# Patient Record
Sex: Male | Born: 1940 | Race: White | Hispanic: No | Marital: Married | Smoking: Former smoker
Health system: Southern US, Community
[De-identification: ages and names within clinical notes are randomized; demographics above are authoritative.]

## PROBLEM LIST (undated history)

## (undated) DIAGNOSIS — M21371 Foot drop, right foot: Secondary | ICD-10-CM

## (undated) DIAGNOSIS — R531 Weakness: Secondary | ICD-10-CM

## (undated) DIAGNOSIS — E785 Hyperlipidemia, unspecified: Secondary | ICD-10-CM

## (undated) DIAGNOSIS — N411 Chronic prostatitis: Secondary | ICD-10-CM

## (undated) DIAGNOSIS — F0390 Unspecified dementia without behavioral disturbance: Secondary | ICD-10-CM

## (undated) DIAGNOSIS — N400 Enlarged prostate without lower urinary tract symptoms: Secondary | ICD-10-CM

## (undated) DIAGNOSIS — K219 Gastro-esophageal reflux disease without esophagitis: Secondary | ICD-10-CM

## (undated) DIAGNOSIS — J449 Chronic obstructive pulmonary disease, unspecified: Secondary | ICD-10-CM

## (undated) DIAGNOSIS — G35 Multiple sclerosis: Secondary | ICD-10-CM

## (undated) DIAGNOSIS — R269 Unspecified abnormalities of gait and mobility: Secondary | ICD-10-CM

## (undated) DIAGNOSIS — D369 Benign neoplasm, unspecified site: Secondary | ICD-10-CM

## (undated) DIAGNOSIS — T7840XA Allergy, unspecified, initial encounter: Secondary | ICD-10-CM

## (undated) DIAGNOSIS — R972 Elevated prostate specific antigen [PSA]: Secondary | ICD-10-CM

## (undated) HISTORY — DX: Allergy, unspecified, initial encounter: T78.40XA

## (undated) HISTORY — DX: Unspecified dementia without behavioral disturbance: F03.90

## (undated) HISTORY — DX: Multiple sclerosis: G35

## (undated) HISTORY — DX: Benign prostatic hyperplasia without lower urinary tract symptoms: N40.0

## (undated) HISTORY — DX: Chronic obstructive pulmonary disease, unspecified: J44.9

## (undated) HISTORY — DX: Benign neoplasm, unspecified site: D36.9

## (undated) HISTORY — DX: Elevated prostate specific antigen (PSA): R97.20

## (undated) HISTORY — DX: Unspecified abnormalities of gait and mobility: R26.9

## (undated) HISTORY — DX: Gastro-esophageal reflux disease without esophagitis: K21.9

## (undated) HISTORY — DX: Foot drop, right foot: M21.371

## (undated) HISTORY — PX: HERNIA REPAIR: SHX51

## (undated) HISTORY — DX: Weakness: R53.1

## (undated) HISTORY — DX: Hyperlipidemia, unspecified: E78.5

## (undated) HISTORY — DX: Chronic prostatitis: N41.1

---

## 2000-06-16 ENCOUNTER — Encounter: Payer: Self-pay | Admitting: Internal Medicine

## 2000-06-16 ENCOUNTER — Encounter: Admission: RE | Admit: 2000-06-16 | Discharge: 2000-06-16 | Payer: Self-pay | Admitting: Family Medicine

## 2002-03-20 ENCOUNTER — Encounter: Payer: Self-pay | Admitting: Internal Medicine

## 2002-03-20 ENCOUNTER — Encounter: Admission: RE | Admit: 2002-03-20 | Discharge: 2002-03-20 | Payer: Self-pay | Admitting: Internal Medicine

## 2002-04-16 ENCOUNTER — Encounter (INDEPENDENT_AMBULATORY_CARE_PROVIDER_SITE_OTHER): Payer: Self-pay | Admitting: Specialist

## 2002-04-16 ENCOUNTER — Ambulatory Visit (HOSPITAL_COMMUNITY): Admission: RE | Admit: 2002-04-16 | Discharge: 2002-04-16 | Payer: Self-pay | Admitting: Gastroenterology

## 2002-04-16 HISTORY — PX: ESOPHAGOGASTRODUODENOSCOPY ENDOSCOPY: SHX5814

## 2002-04-23 ENCOUNTER — Ambulatory Visit (HOSPITAL_COMMUNITY): Admission: RE | Admit: 2002-04-23 | Discharge: 2002-04-23 | Payer: Self-pay | Admitting: Gastroenterology

## 2002-05-08 ENCOUNTER — Encounter: Payer: Self-pay | Admitting: General Surgery

## 2002-05-08 ENCOUNTER — Encounter: Admission: RE | Admit: 2002-05-08 | Discharge: 2002-05-08 | Payer: Self-pay | Admitting: General Surgery

## 2002-05-21 ENCOUNTER — Ambulatory Visit (HOSPITAL_COMMUNITY): Admission: RE | Admit: 2002-05-21 | Discharge: 2002-05-21 | Payer: Self-pay | Admitting: Gastroenterology

## 2002-06-06 ENCOUNTER — Encounter: Admission: RE | Admit: 2002-06-06 | Discharge: 2002-06-06 | Payer: Self-pay | Admitting: General Surgery

## 2002-06-06 ENCOUNTER — Encounter: Payer: Self-pay | Admitting: General Surgery

## 2003-11-18 ENCOUNTER — Ambulatory Visit (HOSPITAL_COMMUNITY): Admission: RE | Admit: 2003-11-18 | Discharge: 2003-11-18 | Payer: Self-pay | Admitting: Orthopedic Surgery

## 2003-11-18 ENCOUNTER — Ambulatory Visit (HOSPITAL_BASED_OUTPATIENT_CLINIC_OR_DEPARTMENT_OTHER): Admission: RE | Admit: 2003-11-18 | Discharge: 2003-11-18 | Payer: Self-pay | Admitting: Orthopedic Surgery

## 2005-05-12 ENCOUNTER — Ambulatory Visit: Payer: Self-pay

## 2005-05-16 ENCOUNTER — Encounter: Admission: RE | Admit: 2005-05-16 | Discharge: 2005-05-16 | Payer: Self-pay | Admitting: Internal Medicine

## 2006-05-26 ENCOUNTER — Encounter: Admission: RE | Admit: 2006-05-26 | Discharge: 2006-05-26 | Payer: Self-pay | Admitting: Internal Medicine

## 2006-09-18 ENCOUNTER — Encounter: Admission: RE | Admit: 2006-09-18 | Discharge: 2006-09-18 | Payer: Self-pay | Admitting: Internal Medicine

## 2006-09-19 DIAGNOSIS — G35 Multiple sclerosis: Secondary | ICD-10-CM

## 2006-09-19 DIAGNOSIS — M21371 Foot drop, right foot: Secondary | ICD-10-CM

## 2006-09-19 HISTORY — DX: Foot drop, right foot: M21.371

## 2006-09-19 HISTORY — DX: Multiple sclerosis: G35

## 2007-10-17 ENCOUNTER — Encounter: Admission: RE | Admit: 2007-10-17 | Discharge: 2007-10-17 | Payer: Self-pay | Admitting: Neurology

## 2008-09-19 DIAGNOSIS — F0390 Unspecified dementia without behavioral disturbance: Secondary | ICD-10-CM

## 2008-09-19 HISTORY — DX: Unspecified dementia, unspecified severity, without behavioral disturbance, psychotic disturbance, mood disturbance, and anxiety: F03.90

## 2008-09-29 ENCOUNTER — Ambulatory Visit (HOSPITAL_BASED_OUTPATIENT_CLINIC_OR_DEPARTMENT_OTHER): Admission: RE | Admit: 2008-09-29 | Discharge: 2008-09-30 | Payer: Self-pay | Admitting: Urology

## 2008-09-29 ENCOUNTER — Encounter (INDEPENDENT_AMBULATORY_CARE_PROVIDER_SITE_OTHER): Payer: Self-pay | Admitting: Urology

## 2008-09-29 HISTORY — PX: PROSTATE SURGERY: SHX751

## 2008-09-30 ENCOUNTER — Emergency Department (HOSPITAL_COMMUNITY): Admission: EM | Admit: 2008-09-30 | Discharge: 2008-09-30 | Payer: Self-pay | Admitting: Emergency Medicine

## 2008-11-28 ENCOUNTER — Encounter: Admission: RE | Admit: 2008-11-28 | Discharge: 2008-11-28 | Payer: Self-pay | Admitting: Neurology

## 2008-11-29 ENCOUNTER — Encounter: Admission: RE | Admit: 2008-11-29 | Discharge: 2008-11-29 | Payer: Self-pay | Admitting: Neurology

## 2009-03-24 ENCOUNTER — Ambulatory Visit: Payer: Self-pay | Admitting: Internal Medicine

## 2009-05-21 ENCOUNTER — Emergency Department (HOSPITAL_COMMUNITY): Admission: EM | Admit: 2009-05-21 | Discharge: 2009-05-21 | Payer: Self-pay | Admitting: Emergency Medicine

## 2009-07-06 ENCOUNTER — Ambulatory Visit: Payer: Self-pay | Admitting: Psychology

## 2009-07-31 DIAGNOSIS — R269 Unspecified abnormalities of gait and mobility: Secondary | ICD-10-CM | POA: Insufficient documentation

## 2009-07-31 HISTORY — DX: Unspecified abnormalities of gait and mobility: R26.9

## 2009-12-07 ENCOUNTER — Ambulatory Visit: Payer: Self-pay | Admitting: Internal Medicine

## 2010-10-05 ENCOUNTER — Encounter
Admission: RE | Admit: 2010-10-05 | Discharge: 2010-10-05 | Payer: Self-pay | Source: Home / Self Care | Attending: Neurology | Admitting: Neurology

## 2010-10-10 ENCOUNTER — Encounter: Payer: Self-pay | Admitting: Urology

## 2010-12-24 LAB — DIFFERENTIAL
Basophils Absolute: 0 10*3/uL (ref 0.0–0.1)
Basophils Relative: 0 % (ref 0–1)
Eosinophils Absolute: 0.2 10*3/uL (ref 0.0–0.7)
Neutro Abs: 3.9 10*3/uL (ref 1.7–7.7)
Neutrophils Relative %: 57 % (ref 43–77)

## 2010-12-24 LAB — CBC
Hemoglobin: 15.7 g/dL (ref 13.0–17.0)
MCHC: 33.7 g/dL (ref 30.0–36.0)
MCV: 89.9 fL (ref 78.0–100.0)
RBC: 5.19 MIL/uL (ref 4.22–5.81)
RDW: 14.7 % (ref 11.5–15.5)

## 2010-12-24 LAB — COMPREHENSIVE METABOLIC PANEL
ALT: 13 U/L (ref 0–53)
Alkaline Phosphatase: 66 U/L (ref 39–117)
BUN: 17 mg/dL (ref 6–23)
CO2: 28 mEq/L (ref 19–32)
GFR calc non Af Amer: >60 mL/min (ref >60)
Glucose, Bld: 88 mg/dL (ref 70–99)
Potassium: 4.1 mEq/L (ref 3.5–5.1)
Sodium: 138 mEq/L (ref 135–145)

## 2010-12-24 LAB — CK TOTAL AND CKMB (NOT AT ARMC): Relative Index: INVALID (ref 0.0–2.5)

## 2010-12-24 LAB — TROPONIN I: Troponin I: 0.01 ng/mL (ref 0.00–0.06)

## 2011-01-03 LAB — POCT HEMOGLOBIN-HEMACUE: Hemoglobin: 15.5 g/dL (ref 13.0–17.0)

## 2011-01-03 LAB — TISSUE CULTURE

## 2011-02-01 NOTE — Op Note (Signed)
NAMEBETTY, BROOKS               ACCOUNT NO.:  [id number]   MEDICAL RECORD NO.:  [id number]          PATIENT TYPE:  AMB   LOCATION:  [facility]                         FACILITY:  [facility]   PHYSICIAN:  Mark C. Vernie Ammons, M.D.  DATE OF BIRTH:  1941-07-23   DATE OF PROCEDURE:  09/29/2008  DATE OF DISCHARGE:                               OPERATIVE REPORT   PREOPERATIVE DIAGNOSES:  1. Benign prostatic hypertrophy with bladder outlet obstruction.  2. Prostate pain.  3. Prostate abnormality.   POSTOPERATIVE DIAGNOSES:  1. Benign prostatic hypertrophy with bladder outlet obstruction.  2. Intraprostatic abscess.   PROCEDURES:  1. Transrectal ultrasound.  2. Transrectal ultrasound guided aspiration of prostatic abscess.  3. Transurethral resection of prostate.   SURGEON:  Mark C. Vernie Ammons, M.D.   ANESTHESIA:  General.   SPECIMENS:  1. Fluid from prostatic abscess sent for culture and sensitivity.  2. Prostate chips to pathology.   DRAIN:  A 22-French three-way Foley catheter.   BLOOD LOSS:  Approximately 50 mL.   COMPLICATIONS:  None.   INDICATIONS:  The patient is a 70 year old white male who for several  months now has been having prostate pain and by ultrasound was found to  have an abnormality of the left lobe of the prostate that did not appear  to be an abscess at that time, although it did appear to be inflammatory  in nature since it was tender.  He had been on several antibiotics  without improvement.  We therefore discussed transrectal ultrasound and  transurethral unroofing of what appeared to possibly be an abscess.  The  risks, complications, alternatives and limitations have been discussed  with the patient and he understands and has elected to proceed.   DESCRIPTION OF OPERATION:  After informed consent, the patient brought  to the major OR, administered general anesthesia and then moved to the  dorsal lithotomy position.  His genitalia were sterilely prepped and  draped and an official timeout was performed.  He received Unasyn and  gentamicin IV preoperatively.  His urethra was sounded with Sissy Hoff  sounds to 30-French.  I then used a 28-French resectoscope sheath with  Timberlake obturator and inserted this in the urethra, removing the  obturator and inserting the resectoscope element with 12 degrees lens.  The bladder was fully inspected and noted to be free of any tumor,  stones or inflammatory lesions.  The ureteral orifices were noted to be  of normal configuration and position and well away from the bladder  neck.   I left the cystoscopic sheath in the urethra and then performed  transrectal ultrasound.   I sequestered the rectum from the rest of the surgical field and through  this access passed the 10 MHz transrectal ultrasound probe into the  rectum.  Transrectal ultrasound was then performed in the axial plane  and I noted the area within the prostate on the left-hand side  approximately mid prostate.  No other lesions or abnormality of the  prostate were noted.   I removed the transrectal ultrasound probe and initially began resecting  the prostate on the  left-hand side from the bladder neck back to the  veru.  I started approximately in the 6 o'clock position and then worked  in a counterclockwise fashion to resect away the prostate in the area  where I felt the prostatic abnormality on the transrectal ultrasound was  located.  I did not appear to encounter the area initially and therefore  reinserted the transrectal ultrasound probe.   Again with the transrectal ultrasound probe be in place, I passed a  spinal needle through the working channel of the ultrasound probe and  under ultrasound guidance was able to pass this into the area of the  prostate that was felt to be abnormal.  I then aspirated this with a 5  mL syringe and got back about 2-3 mL of old bloody appearing material.  It did not appear to be grossly purulent.   This was sent for culture and  sensitivity.   With this spinal needle then within the lesion, I cut the tip off of the  spinal needle and left that in the prostate in order to allow easier  identification of the location of the lesion and then began re-resecting  more of the prostate.   I then resected more of the prostate on the left-hand side and came upon  the needle and noted it was within a pocket within the prostate.  This  was unroofed completely, and I then removed the needle from the rectum.  I then proceeded to resect away the right lobe of the prostate.   I used the Microvasive evacuator to remove the prostatic chips from the  bladder and then reinspected the bladder and noted no injury to the  bladder.  The ureteral orifices were intact and well away from the area  of resection.  The remaining few bleeding points in the prostatic fossa  were fulgurated.  I then left the bladder full, removed the resectoscope  and inserted the Foley catheter and filled the balloon with 25 mL of  sterile water.  This was connected to closed system drainage and  continuous bladder irrigation.  The patient was awakened and taken to  the recovery room in stable and satisfactory condition.  He tolerated  the procedure well.  There were no intraoperative complications.  He  will be monitored overnight and his catheter will be removed in the  morning for a voiding trial.  He will be maintained on gentamicin and  Unasyn while in the hospital and have his oral antibiotics changed based  on the results of the abscess culture results.      Mark C. Vernie Ammons, M.D.  Electronically Signed     MCO/MEDQ  D:  09/29/2008  T:  09/29/2008  Job:  [id number]

## 2011-02-04 NOTE — Op Note (Signed)
   NAME:  [name], [name]                         ACCOUNT NO.:  [id number]   MEDICAL RECORD NO.:  [id number]                   PATIENT TYPE:  AMB   LOCATION:  ENDO                                 FACILITY:  [facility]   PHYSICIAN:  Charolett Bumpers, M.D.             DATE OF BIRTH:  07-14-1941   DATE OF PROCEDURE:  DATE OF DISCHARGE:  04/23/2002                                 OPERATIVE REPORT   PROCEDURE INDICATION:  The patient is a 70 year old male with chronic  gastroesophageal reflux who is undergoing a preoperative esophageal  manometry.  He is to meet with Dr. Wenda Low to discuss laparoscopic  Nissen fundoplication.  His esophageal manometry was performed at [facility] on April 23, 2002, without apparent complications.   1. Lower esophageal sphincter function.  The patient's resting lower     esophageal sphincter pressure is 52 mmHg which is in the upper limits of     normal.  There is 86% relaxation of the lower esophageal sphincter for     ten seconds with a swallow.  His residual pressure is on 7 mmHg which is     well within the normal limits.  His lower esophageal sphincter function     is normal.   1. Esophageal body function.  Low esophageal body function is based on ten     wet swallows which resulted in 100% peristaltic contraction waves with an     amplitude of 58 mmHg which is within the normal range.  His esophageal     body function appears normal.   ASSESSMENT:  Normal esophageal manometry.  The patient is considered to be a  good candidate for antireflux surgery.                                               Charolett Bumpers, M.D.    MKJ/MEDQ  D:  04/30/2002  T:  05/02/2002  Job:  [id number]   cc:   Barbette Or, M.D.   [place] Daphine Deutscher, M.D.  Fax: [phone]

## 2011-02-04 NOTE — Op Note (Signed)
NAME:  [name], [name] NO.:  [id number]   MEDICAL RECORD NO.:  [id number]                   PATIENT TYPE:  AMB   LOCATION:  ENDO                                 FACILITY:  [facility]   PHYSICIAN:  [name], M.D.             DATE OF BIRTH:  [birth year]   DATE OF PROCEDURE:  DATE OF DISCHARGE:                                 OPERATIVE REPORT   INDICATIONS FOR PROCEDURE:  Mr. [name] is a 70 year old male born December 16, 1940.  Mr. [name] has gastroesophageal reflux disease manifested by  heartburn and regurgitation.  If he takes his proton pump inhibitor, his  heartburn resolves, but he has continuous regurgitation.  He is interested  in being evaluated for antireflux surgery.   His July 2003 barium swallow, upper GI x-ray series revealed a hiatal hernia  with normal esophageal function.  Mr. [name] is scheduled to undergo a  diagnostic esophagogastroduodenoscopy today.   MEDICATION ALLERGIES:  None.   MEDICATIONS:  1. Protonix.  2. Aspirin.  3. Motrin.  4. Afrin nasal spray.   PAST MEDICAL HISTORY:  1. Bursitis.  2. Deviated nasal septum.  3. Herniorrhaphies performed by Dr. Francina Ames.   FAMILY HISTORY:  Negative for colon cancer.   HABITS:  Mr.  [name] quit smoking cigarettes in 1977.  He consumes  approximately five mixed alcoholic drinks a week.   SOCIAL HISTORY:  The patient is a Clinical research associate.  He is married.  He has five  children.   ENDOSCOPIST:  [name], M.D.   PREMEDICATION:  Versed 5 mg, Fentanyl 50 mcg.   ENDOSCOPE:  Olympus gastroscope.   DESCRIPTION OF PROCEDURE:  After obtaining informed consent, the patient was  placed in the left lateral decubitus position.  I administered intravenous  Versed and intravenous Fentanyl to achieve conscious sedation for the  procedure.  The patient's blood pressure, oxygen saturations and cardiac  rhythm were monitored throughout the procedure and documented in the medical  record.   The Olympus gastroscope was passed through the posterior hypopharynx into  the proximal esophagus without difficulty.  The hypopharynx, larynx and  vocal cords appeared normal.   Esophagoscopy:  The proximal mid and lower segments of the esophagus  appeared normal.  The squamocolumnar junction is noted at 40 cm from the  incisor teeth.  Three biopsies were taken at the esophagogastric junction to  rule out Barrett's esophagus.  Endoscopically, there is no evidence for the  presence of esophageal mucosal scarring, erosive esophagitis Rogers esophageal  ulceration.   Gastroscopy:  There is a small hiatal hernia.  Retroflex view of the gastric  cardia and fundus were normal.  The diaphragmatic hiatus is only slightly  patulous.  The gastric body, antrum and pylorus appeared normal.   Duodenoscopy:  The duodenal bulb, mid duodenum and distal duodenum appeared  normal.   ASSESSMENT:  Chronic gastroesophageal reflux associated with a  small hiatal  hernia and otherwise normal esophagogastroduodenoscopy.  Three biopsies  taken at the esophagogastric junction to rule out Barrett's esophagus are  pending.   RECOMMENDATIONS:  Schedule preoperative esophageal manometry.                                               [name], M.D.    MKJ/MEDQ  D:  04/16/2002  T:  04/18/2002  Job:  [id number]   cc:   [name], M.D.   [name], M.D.  Fax: [phone]

## 2011-02-04 NOTE — Op Note (Signed)
NAME:  [name], TRUMBULL NO.:  [id number]   MEDICAL RECORD NO.:  [id number]                   PATIENT TYPE:  AMB   LOCATION:  [facility]                                  FACILITY:  [facility]   PHYSICIAN:  Thera Flake., M.D.             DATE OF BIRTH:  14-Oct-1940   DATE OF PROCEDURE:  11/18/2003  DATE OF DISCHARGE:                                 OPERATIVE REPORT   INDICATIONS FOR PROCEDURE:  70 year old male with clinical diagnosis of  adhesive capsulitis/frozen shoulder.  MRI showing some mild involvement of  his biceps tendon, etc.  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   PREOPERATIVE DIAGNOSIS:  Right frozen shoulder.   POSTOPERATIVE DIAGNOSIS:  Right frozen shoulder.   OPERATION:  1. Manipulation under anesthesia.  2. Injection.   DESCRIPTION OF PROCEDURE:  After general anesthesia on the stretcher, the  arm was gently manipulated.  He came to about 80 degrees before abduction to  probably 180 once he was manipulated, external rotation to 30 compared to  100, internal rotation probably 0 compared to 45.  The manipulation was  carried out without difficulty.  Injection was performed, an intra-articular  injection of 5 mL of 0.5% Marcaine and 80 mg Depo-Medrol.  He was taken to  the recovery room in stable condition.                                               Thera Flake., M.D.    WDC/MEDQ  D:  11/18/2003  T:  11/18/2003  Job:  [id number]

## 2011-04-01 ENCOUNTER — Encounter: Payer: Self-pay | Admitting: Internal Medicine

## 2011-04-05 ENCOUNTER — Other Ambulatory Visit: Payer: Medicare Other | Admitting: Internal Medicine

## 2011-04-05 DIAGNOSIS — R5383 Other fatigue: Secondary | ICD-10-CM

## 2011-04-05 DIAGNOSIS — Z0000 Encounter for general adult medical examination without abnormal findings: Secondary | ICD-10-CM

## 2011-04-05 DIAGNOSIS — N400 Enlarged prostate without lower urinary tract symptoms: Secondary | ICD-10-CM

## 2011-04-05 DIAGNOSIS — Z79899 Other long term (current) drug therapy: Secondary | ICD-10-CM

## 2011-04-05 DIAGNOSIS — Z125 Encounter for screening for malignant neoplasm of prostate: Secondary | ICD-10-CM

## 2011-04-05 LAB — CBC WITH DIFFERENTIAL/PLATELET
Eosinophils Relative: 2 % (ref 0–5)
HCT: 45.7 % (ref 39.0–52.0)
Hemoglobin: 16 g/dL (ref 13.0–17.0)
Lymphocytes Relative: 27 % (ref 12–46)
Lymphs Abs: 1.7 10*3/uL (ref 0.7–4.0)
MCV: 84.8 fL (ref 78.0–100.0)
Monocytes Relative: 11 % (ref 3–12)
Platelets: 183 10*3/uL (ref 150–400)
RBC: 5.39 MIL/uL (ref 4.22–5.81)
WBC: 6.4 10*3/uL (ref 4.0–10.5)

## 2011-04-05 LAB — LIPID PANEL
Cholesterol: 227 mg/dL — ABNORMAL HIGH (ref 0–200)
Total CHOL/HDL Ratio: 4.7 Ratio

## 2011-04-05 LAB — COMPREHENSIVE METABOLIC PANEL
ALT: 11 U/L (ref 0–53)
CO2: 18 mEq/L — ABNORMAL LOW (ref 19–32)
Calcium: 9.4 mg/dL (ref 8.4–10.5)
Chloride: 104 mEq/L (ref 96–112)
Creat: 0.81 mg/dL (ref 0.50–1.35)
Glucose, Bld: 97 mg/dL (ref 70–99)
Total Protein: 6.7 g/dL (ref 6.0–8.3)

## 2011-04-07 ENCOUNTER — Ambulatory Visit (INDEPENDENT_AMBULATORY_CARE_PROVIDER_SITE_OTHER): Payer: Medicare Other | Admitting: Internal Medicine

## 2011-04-07 ENCOUNTER — Encounter: Payer: Self-pay | Admitting: Internal Medicine

## 2011-04-07 DIAGNOSIS — D126 Benign neoplasm of colon, unspecified: Secondary | ICD-10-CM

## 2011-04-07 DIAGNOSIS — N411 Chronic prostatitis: Secondary | ICD-10-CM

## 2011-04-07 DIAGNOSIS — J309 Allergic rhinitis, unspecified: Secondary | ICD-10-CM

## 2011-04-07 DIAGNOSIS — K219 Gastro-esophageal reflux disease without esophagitis: Secondary | ICD-10-CM

## 2011-04-07 DIAGNOSIS — N400 Enlarged prostate without lower urinary tract symptoms: Secondary | ICD-10-CM

## 2011-04-07 DIAGNOSIS — G35 Multiple sclerosis: Secondary | ICD-10-CM

## 2011-04-20 DIAGNOSIS — J309 Allergic rhinitis, unspecified: Secondary | ICD-10-CM | POA: Insufficient documentation

## 2011-04-20 DIAGNOSIS — N400 Enlarged prostate without lower urinary tract symptoms: Secondary | ICD-10-CM | POA: Insufficient documentation

## 2011-04-20 DIAGNOSIS — D126 Benign neoplasm of colon, unspecified: Secondary | ICD-10-CM | POA: Insufficient documentation

## 2011-04-20 DIAGNOSIS — N411 Chronic prostatitis: Secondary | ICD-10-CM | POA: Insufficient documentation

## 2011-04-20 DIAGNOSIS — G35 Multiple sclerosis: Secondary | ICD-10-CM | POA: Insufficient documentation

## 2011-04-20 DIAGNOSIS — K219 Gastro-esophageal reflux disease without esophagitis: Secondary | ICD-10-CM | POA: Insufficient documentation

## 2011-04-20 DIAGNOSIS — G35D Multiple sclerosis, unspecified: Secondary | ICD-10-CM | POA: Insufficient documentation

## 2011-04-20 NOTE — Progress Notes (Signed)
  Subjective:    Patient ID: [name], male    DOB: 1940/12/21, 70 y.o.   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 Diagnosed with multiple sclerosis in 2008. Started [name] March 2009. Episode of transient global amnesia September 2010 after intercourse. Has had neuropsychological testing for memory loss 2000 [place] showing deficiencies in visual working memory capacity, consistency of acquisition and retention of visual information, speed of visual sequencing and procedural learning. Tried Aricept for a while but it caused drowsiness. Works out with a Psychologist, educational and at the country club. Tries to play golf once a week. Has a right leg short brace. Now on amoyra for multiple sclerosis.  Former smoker. Quit alcohol in 2005. Quit smoking in 1977. 3 hernia surgeries 1983 1980 05/07/1996. Right arthroscopic acromioplasty September 2005. Tetanus immunization 2006. Pneumovax immunization 2008. Colonoscopy March 2007. Endoscopy July 2003. History of mononucleosis 1963. History of urinary obstruction and infection 1957. History of urethral obstruction 1970. Takes amitriptyline to help with bladder control and sleep.  Father died at age 65 of pneumonia. Mother died at age 16 from unknown causes. History of chronic prostatitis. Recent lab work shows mild LDL elevation at 134. PSA 3.17.    Review of Systems  Constitutional:       Ambulates slowly  Gastrointestinal:       GE reflux  Genitourinary: Positive for urgency and decreased urine volume.  Musculoskeletal:       Leg weakness       Objective:   Physical Exam  Constitutional: He is oriented to person, place, and time.  HENT:  Head: Normocephalic and atraumatic.  Right Ear: External ear normal.  Left Ear: External ear normal.  Mouth/Throat: No oropharyngeal exudate.  Eyes: Pupils are equal, round, and reactive to light.  Neck: Neck supple. No JVD  present. No thyromegaly present.       No carotid bruits  Cardiovascular: Normal rate, regular rhythm, normal heart sounds and intact distal pulses.   No murmur heard. Pulmonary/Chest: Effort normal and breath sounds normal. No respiratory distress. He has no wheezes.  Abdominal: Soft. Bowel sounds are normal. He exhibits no mass. There is no tenderness.  Genitourinary:       Deferred to urologist  Musculoskeletal: He exhibits no edema.  Neurological: He is alert and oriented to person, place, and time.  Skin: Skin is warm and dry. No rash noted.  Psychiatric: He has a normal mood and affect.          Assessment & Plan:  Multiple sclerosis  Adenomatous polyp 2007  GE reflux  Benign prostatic hypertrophy  Chronic prostatitis  Allergic rhinitis  Hyperlipidemia-we'll try to control with diet  Return one year/when necessary.

## 2011-05-09 ENCOUNTER — Other Ambulatory Visit: Payer: Self-pay | Admitting: *Deleted

## 2011-05-09 MED ORDER — ZOLPIDEM TARTRATE 10 MG PO TABS: 10.0000 mg | ORAL_TABLET | Freq: Every evening | ORAL | Status: DC | PRN

## 2011-05-18 ENCOUNTER — Ambulatory Visit (INDEPENDENT_AMBULATORY_CARE_PROVIDER_SITE_OTHER): Payer: Medicare Other | Admitting: General Surgery

## 2011-05-18 ENCOUNTER — Encounter (INDEPENDENT_AMBULATORY_CARE_PROVIDER_SITE_OTHER): Payer: Self-pay | Admitting: General Surgery

## 2011-05-18 VITALS — BP 118/72 | HR 60 | Temp 97.6°F | Ht 74.0 in | Wt 187.1 lb

## 2011-05-18 DIAGNOSIS — K4090 Unilateral inguinal hernia, without obstruction or gangrene, not specified as recurrent: Secondary | ICD-10-CM

## 2011-05-18 NOTE — Progress Notes (Signed)
Chief Complaint  Patient presents with  . Other    new pt- eval of right inguinal hernia     HPI [name] is a 70 y.o. male.   HPI  He is referred by Dr. Lenord Rogers for evaluation of a new right inguinal hernia. He noticed some swelling about 2 months ago. Sometimes he has little pain from it. He does exercise. He also plays golf. At times, he has to strain to urinate. He started Rapiflow and this has improved. He has known BPH and sees Dr. Vernie Rogers for this.  He does have intermittent constipation as well. He has had 3 left inguinal hernia repairs in the past by Dr. Francina Rogers.  Past Medical History  Diagnosis Date  . Multiple sclerosis   . BPH (benign prostatic hyperplasia)   . Prostatitis, chronic   . Allergy   . GERD (gastroesophageal reflux disease)   . COPD (chronic obstructive pulmonary disease)   . Adenomatous polyp   . Neuromuscular disorder     MS    Past Surgical History  Procedure Date  . Hernia repair [id number]    3 inguinal hernia repairs on left    History reviewed. No pertinent family history.  Social History History  Substance Use Topics  . Smoking status: Former Smoker    Quit date: 09/20/1975  . Smokeless tobacco: Never Used  . Alcohol Use: No    No Known Allergies  Current Outpatient Prescriptions  Medication Sig Dispense Refill  . amitriptyline (ELAVIL) 25 MG tablet Take 25 mg by mouth at bedtime.        . Ascorbic Acid (VITAMIN C PO) Take by mouth daily.        Marland Kitchen aspirin 325 MG tablet Take 325 mg by mouth daily.        . Cholecalciferol (VITAMIN D PO) Take by mouth daily.        . interferon beta-1a ([name]) 30 MCG injection Inject 30 mcg into the muscle every 7 (seven) days.        Marland Kitchen zolpidem (AMBIEN) 10 MG tablet Take 1 tablet (10 mg total) by mouth at bedtime as needed.  30 tablet  5  . dutasteride (AVODART) 0.5 MG capsule Take 0.5 mg by mouth daily.          Review of Systems Review of Systems  Constitutional: Negative.   HENT:  Negative.   Respiratory: Positive for cough.   Cardiovascular: Negative.   Gastrointestinal: Positive for constipation.  Genitourinary:       Intermittent difficulty starting stream.  Skin: Negative.   Neurological:       Right leg and arm weakness. Works out with a Psychologist, educational. As a right leg short brace.    Blood pressure 118/72, pulse 60, temperature 97.6 F (36.4 C), height 6\' 2"  (1.88 m), weight 187 lb 2 oz (84.879 kg).  Physical Exam Physical Exam  Constitutional: He appears well-developed and well-nourished. No distress.  Neck: Neck supple.  Cardiovascular: Normal rate and regular rhythm.   No murmur heard. Respiratory: Effort normal and breath sounds normal.  GI: Soft. He exhibits no mass. There is no tenderness.  Genitourinary:       A left inguinal scar is present. No inguinal hernia in the left. A moderate size right inguinal bulge is present. It is reducible in the supine position. No testicular masses.  Musculoskeletal: He exhibits no edema.  Lymphadenopathy:    He has no cervical adenopathy.  Neurological:  Walks with a limp.  Skin: Skin is warm. No rash noted.     Data Reviewed Note from Dr. Lenord Rogers.  Assessment    Symptomatic right inguinal hernia. Has multiple sclerosis. Also has some BPH symptoms. He is interested and right inguinal hernia repair. He has started Rapiflow with improvement in symptoms.  I discussed this with Dr. Vernie Rogers who suggested he continue the Rapiflow.    Plan    Open right inguinal hernia repair with mesh and pain pump placement.    I have explained the procedure, risks, and aftercare of inguinal hernia repair.  Risks include but are not limited to bleeding, infection, wound problems, anesthesia, recurrence, bladder or intestine injury, urinary retention (he is at higher risk for this), testicular dysfunction, chronic pain, mesh problems.  He seems to understand and agrees to proceed.       [name] J 05/18/2011, 10:04  AM

## 2011-06-06 ENCOUNTER — Encounter (HOSPITAL_COMMUNITY)
Admission: RE | Admit: 2011-06-06 | Discharge: 2011-06-06 | Disposition: A | Discharge status: Home / Self Care | Payer: Medicare Other | Source: Ambulatory Visit | Attending: General Surgery | Admitting: General Surgery

## 2011-06-06 ENCOUNTER — Other Ambulatory Visit (INDEPENDENT_AMBULATORY_CARE_PROVIDER_SITE_OTHER): Payer: Self-pay | Admitting: General Surgery

## 2011-06-06 LAB — DIFFERENTIAL
Basophils Absolute: 0 10*3/uL (ref 0.0–0.1)
Basophils Relative: 0 % (ref 0–1)
Eosinophils Absolute: 0.1 10*3/uL (ref 0.0–0.7)
Monocytes Relative: 9 % (ref 3–12)
Neutrophils Relative %: 61 % (ref 43–77)

## 2011-06-06 LAB — SURGICAL PCR SCREEN
MRSA, PCR: NEGATIVE
Staphylococcus aureus: NEGATIVE

## 2011-06-06 LAB — CBC
MCH: 30.2 pg (ref 26.0–34.0)
Platelets: 168 10*3/uL (ref 150–400)
RBC: 5.1 MIL/uL (ref 4.22–5.81)
WBC: 7.5 10*3/uL (ref 4.0–10.5)

## 2011-06-06 LAB — COMPREHENSIVE METABOLIC PANEL
Albumin: 3.8 g/dL (ref 3.5–5.2)
Alkaline Phosphatase: 58 U/L (ref 39–117)
BUN: 22 mg/dL (ref 6–23)
Potassium: 4.3 mEq/L (ref 3.5–5.1)
Total Protein: 6.6 g/dL (ref 6.0–8.3)

## 2011-06-06 LAB — PROTIME-INR
INR: 0.96 (ref 0.00–1.49)
Prothrombin Time: 13 seconds (ref 11.6–15.2)

## 2011-06-16 ENCOUNTER — Ambulatory Visit (HOSPITAL_COMMUNITY)
Admission: RE | Admit: 2011-06-16 | Discharge: 2011-06-16 | Disposition: A | Discharge status: Home / Self Care | Payer: Medicare Other | Source: Ambulatory Visit | Attending: General Surgery | Admitting: General Surgery

## 2011-06-16 DIAGNOSIS — G35 Multiple sclerosis: Secondary | ICD-10-CM | POA: Insufficient documentation

## 2011-06-16 DIAGNOSIS — K4090 Unilateral inguinal hernia, without obstruction or gangrene, not specified as recurrent: Secondary | ICD-10-CM | POA: Insufficient documentation

## 2011-06-16 DIAGNOSIS — Z01818 Encounter for other preprocedural examination: Secondary | ICD-10-CM | POA: Insufficient documentation

## 2011-06-16 DIAGNOSIS — J4489 Other specified chronic obstructive pulmonary disease: Secondary | ICD-10-CM | POA: Insufficient documentation

## 2011-06-16 DIAGNOSIS — Z01810 Encounter for preprocedural cardiovascular examination: Secondary | ICD-10-CM | POA: Insufficient documentation

## 2011-06-16 DIAGNOSIS — J449 Chronic obstructive pulmonary disease, unspecified: Secondary | ICD-10-CM | POA: Insufficient documentation

## 2011-06-16 DIAGNOSIS — Z01812 Encounter for preprocedural laboratory examination: Secondary | ICD-10-CM | POA: Insufficient documentation

## 2011-06-16 HISTORY — PX: HERNIA REPAIR: SHX51

## 2011-06-29 ENCOUNTER — Encounter (INDEPENDENT_AMBULATORY_CARE_PROVIDER_SITE_OTHER): Payer: Self-pay | Admitting: General Surgery

## 2011-06-29 ENCOUNTER — Ambulatory Visit (INDEPENDENT_AMBULATORY_CARE_PROVIDER_SITE_OTHER): Payer: Medicare Other | Admitting: General Surgery

## 2011-06-29 VITALS — BP 100/68 | HR 64 | Temp 97.8°F | Resp 16 | Ht 73.0 in | Wt 192.0 lb

## 2011-06-29 DIAGNOSIS — K4090 Unilateral inguinal hernia, without obstruction or gangrene, not specified as recurrent: Secondary | ICD-10-CM

## 2011-06-29 NOTE — Progress Notes (Signed)
Operation: Open right inguinal hernia repair with mesh  Date: June 16, 2011  Pathology:  na  HPI: [name] is here for his first postoperative visit. He is doing well. Minimal discomfort. Voiding well. In fact, he states it is easier to void now. Bowels moving well.   Physical Exam: [name] looks well and is in no acute distress.  Right groin-incision clean, dry, intact with minimal swelling. Hernia repair solid.   Assessment: Doing well following right inguinal hernia repair.  Plan: Continue light activities until 6 weeks after the operation and resume her normal activities as tolerated. Potential for long-term pain issues discussed with him. Return visit p.r.n.

## 2011-06-29 NOTE — Patient Instructions (Signed)
Continue light activities until 6 weeks after the operation. May resume her usual activities at that time as long as you're not uncomfortable with them. Return as needed.

## 2011-06-30 NOTE — Op Note (Signed)
[name], Jose NO.:  [id number]  MEDICAL RECORD NO.:  [id number]  LOCATION:  [facility]                         FACILITY:  [facility]  PHYSICIAN:  Adolph Pollack, M.D.DATE OF BIRTH:  Sep 14, 1941  DATE OF PROCEDURE:  06/16/2011 DATE OF DISCHARGE:                              OPERATIVE REPORT   PREOPERATIVE DIAGNOSIS:  Right inguinal hernia.  POSTOPERATIVE DIAGNOSIS:  Direct right inguinal hernia.  PROCEDURE:  Right inguinal hernia repair with mesh.  SURGEON:  Adolph Pollack, MD  ANESTHESIA:  Local ( Exparel) with MAC.  INDICATIONS:  [name] is a 70 year old male who has had 3 previous left inguinal hernia repairs.  He has noted some swelling and pain in the right groin and is consistent with a right inguinal hernia.  He now presents for repair.  The procedure risks and aftercare were discussed with him preoperatively.  TECHNIQUE:  He was seen in the holding area and the right groin marked with my initials.  He was then brought to the operating room, placed supine on the operating room, and given intravenous sedation.  The hair in the right groin and lower abdominal wall were clipped and the area was sterilely prepped and draped.  Local anesthetic consisting of Exparel was injected superficially and deep and then we waited some time for it to take effect.  I then made a right groin incision through the skin, subcutaneous tissue until the external oblique aponeurosis was identified.  Local anesthetic was infiltrated deep to this.  We then made an incision and external oblique aponeurosis through the external ring medially and up towards the anterior superior iliac spine laterally.  Using blunt dissection, I exposed the internal oblique muscle and aponeurosis superiorly and the shelving edge of the inguinal ligament inferiorly.  I isolated the spermatic cord.  A broad-based direct hernia was noted with the sac adherent to the spermatic cord.  Using  careful blunt and sharp dissection, I freed this up from the spermatic cord and then placed a sponge to manually reduce it through the defect.  I then mobilized the spermatic cord creating more of a posterior window.  I retracted it anteriorly.  A piece of 3 x 6 inch polypropylene mesh was brought into the field and anchored 2 cm to pubic tubercle with 2-0 Prolene suture. The inferior aspect of the mesh was anchored to the shelving edge of the inguinal ligament, a running 2-0 Prolene suture up to level 2 cm lateral to the internal ring.  It flipped, was cut the mesh creating two tails. The superior aspect of the mesh was anchored to internal oblique aponeurosis and muscle with interrupted 2-0 Vicryl sutures.  Two tails of mesh were crossed creating a new internal ring and these were anchored to the shelving edge of the inguinal ligament with 2-0 Prolene suture.  The lateral aspect of the mesh was tucked deep to the external oblique aponeurosis.  This provided good coverage with more than adequate overlap for the hernia repair.I inspected once hemostasis was adequate.  I then closed external oblique aponeurosis over the mesh and cord with running 3-0 Vicryl suture.  Subcutaneous tissue was approximated with running 3-0 Vicryl  suture.  The skin was closed with 4-0 Monocryl subcuticular stitch. Steri-Strips and sterile dressings were applied.  He tolerated the procedure well without apparent complications and was taken to recovery room in a satisfactory condition.     Adolph Pollack, M.D.     Kari Baars  D:  06/16/2011  T:  06/16/2011  Job:  [id number]  cc:   Luanna Cole. Lenord Fellers, M.D.  Electronically Signed by Avel Peace M.D. on 06/30/2011 10:25:22 AM

## 2011-09-20 HISTORY — PX: CATARACT EXTRACTION: SUR2

## 2011-09-26 DIAGNOSIS — G35 Multiple sclerosis: Secondary | ICD-10-CM | POA: Diagnosis not present

## 2011-10-03 DIAGNOSIS — G35 Multiple sclerosis: Secondary | ICD-10-CM | POA: Diagnosis not present

## 2011-10-10 DIAGNOSIS — G35 Multiple sclerosis: Secondary | ICD-10-CM | POA: Diagnosis not present

## 2011-10-12 DIAGNOSIS — G35 Multiple sclerosis: Secondary | ICD-10-CM | POA: Diagnosis not present

## 2011-10-17 DIAGNOSIS — G35 Multiple sclerosis: Secondary | ICD-10-CM | POA: Diagnosis not present

## 2011-10-24 DIAGNOSIS — G35 Multiple sclerosis: Secondary | ICD-10-CM | POA: Diagnosis not present

## 2011-10-26 DIAGNOSIS — N319 Neuromuscular dysfunction of bladder, unspecified: Secondary | ICD-10-CM | POA: Diagnosis not present

## 2011-10-26 DIAGNOSIS — R3915 Urgency of urination: Secondary | ICD-10-CM | POA: Diagnosis not present

## 2011-10-31 DIAGNOSIS — G35 Multiple sclerosis: Secondary | ICD-10-CM | POA: Diagnosis not present

## 2011-11-07 DIAGNOSIS — G35 Multiple sclerosis: Secondary | ICD-10-CM | POA: Diagnosis not present

## 2011-11-10 DIAGNOSIS — N319 Neuromuscular dysfunction of bladder, unspecified: Secondary | ICD-10-CM | POA: Diagnosis not present

## 2011-11-14 DIAGNOSIS — G35 Multiple sclerosis: Secondary | ICD-10-CM | POA: Diagnosis not present

## 2011-11-16 ENCOUNTER — Other Ambulatory Visit: Payer: Self-pay

## 2011-11-16 MED ORDER — ZOLPIDEM TARTRATE 10 MG PO TABS: 10.0000 mg | ORAL_TABLET | Freq: Every evening | ORAL | Status: DC | PRN

## 2011-11-21 DIAGNOSIS — G35 Multiple sclerosis: Secondary | ICD-10-CM | POA: Diagnosis not present

## 2011-11-21 DIAGNOSIS — H2510 Age-related nuclear cataract, unspecified eye: Secondary | ICD-10-CM | POA: Diagnosis not present

## 2011-11-28 DIAGNOSIS — G35 Multiple sclerosis: Secondary | ICD-10-CM | POA: Diagnosis not present

## 2011-12-05 DIAGNOSIS — G35 Multiple sclerosis: Secondary | ICD-10-CM | POA: Diagnosis not present

## 2011-12-12 DIAGNOSIS — G35 Multiple sclerosis: Secondary | ICD-10-CM | POA: Diagnosis not present

## 2011-12-26 DIAGNOSIS — G35 Multiple sclerosis: Secondary | ICD-10-CM | POA: Diagnosis not present

## 2011-12-26 DIAGNOSIS — H2510 Age-related nuclear cataract, unspecified eye: Secondary | ICD-10-CM | POA: Diagnosis not present

## 2012-01-09 DIAGNOSIS — G35 Multiple sclerosis: Secondary | ICD-10-CM | POA: Diagnosis not present

## 2012-01-16 DIAGNOSIS — IMO0002 Reserved for concepts with insufficient information to code with codable children: Secondary | ICD-10-CM | POA: Diagnosis not present

## 2012-01-16 DIAGNOSIS — H269 Unspecified cataract: Secondary | ICD-10-CM | POA: Diagnosis not present

## 2012-01-16 DIAGNOSIS — H2510 Age-related nuclear cataract, unspecified eye: Secondary | ICD-10-CM | POA: Diagnosis not present

## 2012-01-16 DIAGNOSIS — G35 Multiple sclerosis: Secondary | ICD-10-CM | POA: Diagnosis not present

## 2012-01-23 DIAGNOSIS — H2510 Age-related nuclear cataract, unspecified eye: Secondary | ICD-10-CM | POA: Diagnosis not present

## 2012-01-23 DIAGNOSIS — G35 Multiple sclerosis: Secondary | ICD-10-CM | POA: Diagnosis not present

## 2012-01-30 DIAGNOSIS — H269 Unspecified cataract: Secondary | ICD-10-CM | POA: Diagnosis not present

## 2012-01-30 DIAGNOSIS — H2510 Age-related nuclear cataract, unspecified eye: Secondary | ICD-10-CM | POA: Diagnosis not present

## 2012-01-30 DIAGNOSIS — G35 Multiple sclerosis: Secondary | ICD-10-CM | POA: Diagnosis not present

## 2012-01-30 DIAGNOSIS — IMO0002 Reserved for concepts with insufficient information to code with codable children: Secondary | ICD-10-CM | POA: Diagnosis not present

## 2012-02-06 DIAGNOSIS — G35 Multiple sclerosis: Secondary | ICD-10-CM | POA: Diagnosis not present

## 2012-02-06 HISTORY — PX: CATARACT EXTRACTION: SUR2

## 2012-02-09 DIAGNOSIS — G35 Multiple sclerosis: Secondary | ICD-10-CM | POA: Diagnosis not present

## 2012-02-14 DIAGNOSIS — G35 Multiple sclerosis: Secondary | ICD-10-CM | POA: Diagnosis not present

## 2012-02-20 DIAGNOSIS — G35 Multiple sclerosis: Secondary | ICD-10-CM | POA: Diagnosis not present

## 2012-02-27 DIAGNOSIS — G35 Multiple sclerosis: Secondary | ICD-10-CM | POA: Diagnosis not present

## 2012-03-05 DIAGNOSIS — G35 Multiple sclerosis: Secondary | ICD-10-CM | POA: Diagnosis not present

## 2012-03-15 ENCOUNTER — Other Ambulatory Visit: Payer: Self-pay

## 2012-03-15 MED ORDER — ZOLPIDEM TARTRATE 10 MG PO TABS: 10.0000 mg | ORAL_TABLET | Freq: Every evening | ORAL | Status: DC | PRN

## 2012-05-03 ENCOUNTER — Other Ambulatory Visit: Payer: Medicare Other | Admitting: Internal Medicine

## 2012-05-03 DIAGNOSIS — Z125 Encounter for screening for malignant neoplasm of prostate: Secondary | ICD-10-CM

## 2012-05-03 DIAGNOSIS — F1920 Other psychoactive substance dependence, uncomplicated: Secondary | ICD-10-CM

## 2012-05-03 DIAGNOSIS — G35 Multiple sclerosis: Secondary | ICD-10-CM

## 2012-05-03 DIAGNOSIS — K219 Gastro-esophageal reflux disease without esophagitis: Secondary | ICD-10-CM

## 2012-05-03 DIAGNOSIS — N400 Enlarged prostate without lower urinary tract symptoms: Secondary | ICD-10-CM

## 2012-05-03 LAB — CBC WITH DIFFERENTIAL/PLATELET
Hemoglobin: 15.6 g/dL (ref 13.0–17.0)
Lymphs Abs: 2.2 10*3/uL (ref 0.7–4.0)
MCH: 29 pg (ref 26.0–34.0)
Monocytes Relative: 10 % (ref 3–12)
Neutro Abs: 3.3 10*3/uL (ref 1.7–7.7)
Neutrophils Relative %: 51 % (ref 43–77)
RBC: 5.38 MIL/uL (ref 4.22–5.81)

## 2012-05-03 LAB — PSA, MEDICARE: PSA: 3.81 ng/mL (ref <=4.00)

## 2012-05-03 LAB — COMPREHENSIVE METABOLIC PANEL
Albumin: 4.2 g/dL (ref 3.5–5.2)
CO2: 28 mEq/L (ref 19–32)
Chloride: 102 mEq/L (ref 96–112)
Glucose, Bld: 71 mg/dL (ref 70–99)
Potassium: 4.3 mEq/L (ref 3.5–5.3)
Sodium: 139 mEq/L (ref 135–145)
Total Protein: 6.5 g/dL (ref 6.0–8.3)

## 2012-05-03 LAB — LIPID PANEL: Cholesterol: 229 mg/dL — ABNORMAL HIGH (ref 0–200)

## 2012-05-04 ENCOUNTER — Other Ambulatory Visit: Payer: Medicare Other | Admitting: Internal Medicine

## 2012-05-07 ENCOUNTER — Encounter: Payer: Self-pay | Admitting: Internal Medicine

## 2012-05-07 ENCOUNTER — Ambulatory Visit (INDEPENDENT_AMBULATORY_CARE_PROVIDER_SITE_OTHER): Payer: Medicare Other | Admitting: Internal Medicine

## 2012-05-07 VITALS — BP 106/68 | HR 92 | Temp 97.5°F | Ht 72.5 in | Wt 185.0 lb

## 2012-05-07 DIAGNOSIS — N319 Neuromuscular dysfunction of bladder, unspecified: Secondary | ICD-10-CM | POA: Diagnosis not present

## 2012-05-07 DIAGNOSIS — Z0000 Encounter for general adult medical examination without abnormal findings: Secondary | ICD-10-CM | POA: Diagnosis not present

## 2012-05-07 DIAGNOSIS — G35 Multiple sclerosis: Secondary | ICD-10-CM

## 2012-05-07 DIAGNOSIS — K6289 Other specified diseases of anus and rectum: Secondary | ICD-10-CM

## 2012-05-07 DIAGNOSIS — B356 Tinea cruris: Secondary | ICD-10-CM

## 2012-05-07 NOTE — Progress Notes (Signed)
Subjective:    Patient ID: [name], male    DOB: December 22, 1940, 71 y.o.   MRN: [id number]  HPI 71 year old male with history of multiple sclerosis followed by Dr. [name] in today for health maintenance and evaluation of medical problems. Diagnosed with multiple sclerosis in 2008. He's on [name]. In 2010 he developed transient global amnesia after intercourse an MRI at that time was unchanged. MRI 2008 showed bilateral white matter findings without cord abnormalities. VER was abnormal on the left with delay and BAER was normal. Neuropsychological testing for memory loss 2010 showed deficiencies in visual working memory capacity. He is a retired Clinical research associate. He was on Aricept for a while but this caused sleepiness and he discontinued it. He has fallen from time to time due to gait instability. Does work out with a Psychologist, educational several days a week. He tries to play golf. Sometimes uses walking sticks. He has a right leg short brace. MRI and January 2012 showed a new plaque in the left cerebellum. He also had a chronic demyelinating plaque at C2. He has fatigue and takes a nap daily. He has nocturia once or twice a night.  History of BPH and chronic prostatitis  History of allergic rhinitis  History of mild COPD  History of GE reflux  History of adenomatous colon polyp  History of mononucleosis 1963, infection from urinary obstruction 1957, urethral obstruction 1970  Right arthroscopic acromioplasty distal clavicle excision and debridement of labral September 2005 herniorrhaphy in 1983, 1988, 1997 and right inguinal hernia repair 06/16/2011  Had tetanus immunization August 2006 and Pneumovax immunization 2008. Dr. Danise Edge did colonoscopy March 2007.   Social history: This is his second marriage. He smoked around 4098-1191 1-1/2 packs daily. Does not consume alcohol. No children. One sister in good health. Father died at age 85 with Alzheimer's disease. Mother died with complications of depression  at age 71  Patient had Cardiolite study in 2006 which was normal read by Dr. Jens Som.    Review of Systems  Constitutional: Positive for fatigue.  HENT: Negative.   Eyes: Negative.   Respiratory: Negative.   Cardiovascular: Negative.   Gastrointestinal: Negative.   Genitourinary:       Neurogenic bladder, rash on scrotum and Rectal area  Musculoskeletal: Positive for gait problem.       Gait problem related to multiple sclerosis  Neurological:       Bilateral arm weakness, bilateral leg weakness  Hematological: Negative.   Psychiatric/Behavioral: Negative.        Objective:   Physical Exam  Vitals reviewed. Constitutional: He is oriented to person, place, and time. He appears well-developed and well-nourished. No distress.  HENT:  Head: Normocephalic and atraumatic.  Right Ear: External ear normal.  Left Ear: External ear normal.  Mouth/Throat: Oropharynx is clear and moist. No oropharyngeal exudate.  Eyes: Conjunctivae and EOM are normal. Pupils are equal, round, and reactive to light. Right eye exhibits no discharge. Left eye exhibits no discharge. No scleral icterus.  Neck: Neck supple. No JVD present. No thyromegaly present.  Cardiovascular: Normal rate, regular rhythm, normal heart sounds and intact distal pulses.   No murmur heard. Pulmonary/Chest: Effort normal and breath sounds normal. No respiratory distress. He has no wheezes. He has no rales.  Abdominal: Soft. Bowel sounds are normal. He exhibits no distension and no mass. There is no tenderness. There is no rebound and no guarding.  Genitourinary:       Prostate exam deferred to Dr. Logan Bores Perirectal erythema.  Scrotal rash consistent with tinea cruris  Musculoskeletal: He exhibits no edema.  Lymphadenopathy:    He has no cervical adenopathy.  Neurological: He is alert and oriented to person, place, and time. Coordination abnormal.       Bilateral leg and arm weakness. Difficulty raising arms up over his head.  Hesitates when shaking hands.  Skin: Skin is warm and dry. Rash noted. He is not diaphoretic.       Scrotal rash and perirectal inflammation and irritation  Psychiatric: He has a normal mood and affect. His behavior is normal. Judgment and thought content normal.          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 Watch diet. Refill amitriptyline 25 mg at bedtime which helps neurogenic bladder and refill Rapaflo tablets daily for one year Subjective:   Patient presents for Medicare Annual/Subsequent preventive examination.   Review Past Medical/Family/Social: see above   Risk Factors  Current exercise habits: weights 5 days a week has personal trainer Dietary issues discussed: Not overweight  Cardiac risk factors:  Depression Screen  (Note: if answer to either of the following is "Yes", a more complete depression screening is indicated)   Over the past two weeks, have you felt down, depressed or hopeless? No  Over the past two weeks, have you felt little interest or pleasure in doing things? No Have you lost interest or pleasure in daily life? No Do you often feel hopeless? No Do you cry easily over simple problems? No   Activities of Daily Living  In your present state of health, do you have any difficulty performing the following activities?:   Driving? No  Managing money? No  Feeding yourself? No  Getting from bed to chair? No  Climbing a flight of stairs? No  Preparing food and eating?: No  Bathing or showering? No  Getting dressed: No  Getting to the toilet? No  Using the toilet:No  Moving around from place to place: No  In the past year have you fallen or had a near fall?:No  Are you sexually active? No  Do you have  more than one partner? No   Hearing Difficulties: No  Do you often ask people to speak up or repeat themselves? No  Do you experience ringing or noises in your ears? No  Do you have difficulty understanding soft or whispered voices? No  Do you feel that you have a problem with memory? No Do you often misplace items? No    Home Safety:  Do you have a smoke alarm at your residence? Yes Do you have grab bars in the bathroom? yes Do you have throw rugs in your house? yes   Cognitive Testing  Alert? Yes Normal Appearance?Yes  Oriented to person? Yes Place? Yes  Time? Yes  Recall of three objects? Yes  Can perform simple calculations? Yes  Displays appropriate judgment?Yes  Can read the correct time from a watch face?Yes   List the Names of Other Physician/Practitioners you currently use:  See referral list for the physicians patient is currently seeing. Dr. [name]- neurologist;  Dr.Evans-urologist    Review of Systems: see Epic   Objective:     General appearance: Appears stated age  Head: Normocephalic, without obvious abnormality, atraumatic  Eyes: conj clear, EOMi PEERLA  Ears: normal TM's and external ear canals both ears  Nose: Nares normal. Septum midline. Mucosa normal. No drainage or sinus tenderness.  Throat: lips, mucosa, and tongue normal; teeth and gums normal  Neck: no adenopathy, no carotid bruit, no JVD, supple, symmetrical, trachea midline and thyroid not enlarged, symmetric, no tenderness/mass/nodules  No CVA tenderness.  Lungs: clear to auscultation bilaterally  Breasts: normal appearance, no masses or tenderness,  Heart: regular rate and rhythm, S1, S2 normal, no murmur, click, rub or gallop  Abdomen: soft, non-tender; bowel sounds normal; no masses, no organomegaly  Musculoskeletal: ROM normal in all joints, no crepitus, no deformity, Normal muscle strengthen. Back  is symmetric, no curvature. Skin: Skin color, texture, turgor normal. No rashes or lesions   Lymph nodes: Cervical, supraclavicular, and axillary nodes normal.  Neurologic: CN 2 -12 Normal, Normal symmetric reflexes. Normal coordination and gait  Psych: Alert & Oriented x 3, Mood appear stable.    Assessment:    Annual wellness medicare exam   Plan:    During the course of the visit the patient was educated and counseled about appropriate screening and preventive services including: Check with Neurologist to see if can have Zostavax vaccine while on  [name]. Other health maintenance items are up to date. For influenza vaccine Oct. 1st.       Patient Instructions (the written plan) was given to the patient.  Medicare Attestation  I have personally reviewed:  The patient's medical and social history  Their use of alcohol, tobacco or illicit drugs  Their current medications and supplements  The patient's functional ability including ADLs,fall risks, home safety risks, cognitive, and hearing and visual impairment  Diet and physical activities  Evidence for depression or mood disorders  The patient's weight, height, BMI, and visual acuity have been recorded in the chart. I have made referrals, counseling, and provided education to the patient based on review of the above and I have provided the patient with a written personalized care plan for preventive services.

## 2012-05-07 NOTE — Patient Instructions (Addendum)
Use Mycolog cream on scrotum and perirectal area twice daily. Continue same medications. Return in one year. Consume a low-fat diet.

## 2012-05-10 DIAGNOSIS — N400 Enlarged prostate without lower urinary tract symptoms: Secondary | ICD-10-CM | POA: Diagnosis not present

## 2012-05-10 DIAGNOSIS — N319 Neuromuscular dysfunction of bladder, unspecified: Secondary | ICD-10-CM | POA: Diagnosis not present

## 2012-05-10 DIAGNOSIS — R3911 Hesitancy of micturition: Secondary | ICD-10-CM | POA: Diagnosis not present

## 2012-05-14 DIAGNOSIS — G35 Multiple sclerosis: Secondary | ICD-10-CM | POA: Diagnosis not present

## 2012-05-22 DIAGNOSIS — G35 Multiple sclerosis: Secondary | ICD-10-CM | POA: Diagnosis not present

## 2012-05-28 DIAGNOSIS — G35 Multiple sclerosis: Secondary | ICD-10-CM | POA: Diagnosis not present

## 2012-06-04 DIAGNOSIS — G35 Multiple sclerosis: Secondary | ICD-10-CM | POA: Diagnosis not present

## 2012-06-05 ENCOUNTER — Other Ambulatory Visit: Payer: Self-pay

## 2012-06-05 ENCOUNTER — Other Ambulatory Visit: Payer: Self-pay | Admitting: Internal Medicine

## 2012-06-11 DIAGNOSIS — G35 Multiple sclerosis: Secondary | ICD-10-CM | POA: Diagnosis not present

## 2012-06-18 DIAGNOSIS — G35 Multiple sclerosis: Secondary | ICD-10-CM | POA: Diagnosis not present

## 2012-06-25 DIAGNOSIS — G35 Multiple sclerosis: Secondary | ICD-10-CM | POA: Diagnosis not present

## 2012-07-02 DIAGNOSIS — G35 Multiple sclerosis: Secondary | ICD-10-CM | POA: Diagnosis not present

## 2012-07-17 DIAGNOSIS — G35 Multiple sclerosis: Secondary | ICD-10-CM | POA: Diagnosis not present

## 2012-07-23 DIAGNOSIS — G35 Multiple sclerosis: Secondary | ICD-10-CM | POA: Diagnosis not present

## 2012-07-30 DIAGNOSIS — G35 Multiple sclerosis: Secondary | ICD-10-CM | POA: Diagnosis not present

## 2012-08-06 DIAGNOSIS — G35 Multiple sclerosis: Secondary | ICD-10-CM | POA: Diagnosis not present

## 2012-08-13 DIAGNOSIS — G35 Multiple sclerosis: Secondary | ICD-10-CM | POA: Diagnosis not present

## 2012-08-20 DIAGNOSIS — G35 Multiple sclerosis: Secondary | ICD-10-CM | POA: Diagnosis not present

## 2012-08-27 DIAGNOSIS — G35 Multiple sclerosis: Secondary | ICD-10-CM | POA: Diagnosis not present

## 2012-09-03 DIAGNOSIS — G35 Multiple sclerosis: Secondary | ICD-10-CM | POA: Diagnosis not present

## 2012-09-03 DIAGNOSIS — H04129 Dry eye syndrome of unspecified lacrimal gland: Secondary | ICD-10-CM | POA: Diagnosis not present

## 2012-09-10 DIAGNOSIS — G35 Multiple sclerosis: Secondary | ICD-10-CM | POA: Diagnosis not present

## 2012-09-24 DIAGNOSIS — G35 Multiple sclerosis: Secondary | ICD-10-CM | POA: Diagnosis not present

## 2012-10-01 DIAGNOSIS — G35 Multiple sclerosis: Secondary | ICD-10-CM | POA: Diagnosis not present

## 2012-10-08 DIAGNOSIS — G35 Multiple sclerosis: Secondary | ICD-10-CM | POA: Diagnosis not present

## 2012-10-15 DIAGNOSIS — G35 Multiple sclerosis: Secondary | ICD-10-CM | POA: Diagnosis not present

## 2012-10-22 DIAGNOSIS — G35 Multiple sclerosis: Secondary | ICD-10-CM | POA: Diagnosis not present

## 2012-10-25 DIAGNOSIS — G35 Multiple sclerosis: Secondary | ICD-10-CM | POA: Diagnosis not present

## 2012-10-29 DIAGNOSIS — G35 Multiple sclerosis: Secondary | ICD-10-CM | POA: Diagnosis not present

## 2012-11-05 DIAGNOSIS — G35 Multiple sclerosis: Secondary | ICD-10-CM | POA: Diagnosis not present

## 2012-11-07 DIAGNOSIS — N400 Enlarged prostate without lower urinary tract symptoms: Secondary | ICD-10-CM | POA: Diagnosis not present

## 2012-11-07 DIAGNOSIS — R972 Elevated prostate specific antigen [PSA]: Secondary | ICD-10-CM | POA: Diagnosis not present

## 2012-11-07 DIAGNOSIS — N319 Neuromuscular dysfunction of bladder, unspecified: Secondary | ICD-10-CM | POA: Diagnosis not present

## 2012-11-07 DIAGNOSIS — R3911 Hesitancy of micturition: Secondary | ICD-10-CM | POA: Diagnosis not present

## 2012-11-12 DIAGNOSIS — G35 Multiple sclerosis: Secondary | ICD-10-CM | POA: Diagnosis not present

## 2012-11-19 DIAGNOSIS — G35 Multiple sclerosis: Secondary | ICD-10-CM | POA: Diagnosis not present

## 2012-11-26 DIAGNOSIS — G35 Multiple sclerosis: Secondary | ICD-10-CM | POA: Diagnosis not present

## 2012-12-03 DIAGNOSIS — G35 Multiple sclerosis: Secondary | ICD-10-CM | POA: Diagnosis not present

## 2012-12-07 DIAGNOSIS — L821 Other seborrheic keratosis: Secondary | ICD-10-CM | POA: Diagnosis not present

## 2012-12-07 DIAGNOSIS — L570 Actinic keratosis: Secondary | ICD-10-CM | POA: Diagnosis not present

## 2012-12-07 DIAGNOSIS — L819 Disorder of pigmentation, unspecified: Secondary | ICD-10-CM | POA: Diagnosis not present

## 2012-12-07 DIAGNOSIS — L219 Seborrheic dermatitis, unspecified: Secondary | ICD-10-CM | POA: Diagnosis not present

## 2012-12-10 ENCOUNTER — Ambulatory Visit (INDEPENDENT_AMBULATORY_CARE_PROVIDER_SITE_OTHER): Payer: Medicare Other | Admitting: *Deleted

## 2012-12-10 DIAGNOSIS — G35 Multiple sclerosis: Secondary | ICD-10-CM | POA: Diagnosis not present

## 2012-12-10 MED ORDER — INTERFERON BETA-1A 30 MCG/0.5ML IM KIT
30.0000 ug | PACK | INTRAMUSCULAR | Status: AC
  Administered 2012-12-10 – 2013-01-07 (×4): 30 ug via INTRAMUSCULAR

## 2012-12-10 NOTE — Progress Notes (Signed)
Pt here for weekly [name] injection.  Under aseptic technique avonex 30mcg/0.5ml IM given L thigh.  Pt tolerated well. Bandaid applied.  To check out.  NAD.  See next Monday.

## 2012-12-17 ENCOUNTER — Ambulatory Visit (INDEPENDENT_AMBULATORY_CARE_PROVIDER_SITE_OTHER): Payer: Medicare Other | Admitting: *Deleted

## 2012-12-17 DIAGNOSIS — G35 Multiple sclerosis: Secondary | ICD-10-CM | POA: Diagnosis not present

## 2012-12-17 NOTE — Progress Notes (Signed)
Pt here for [name] injection.  Under aseptic technique [name] 30mcg/0.5ml IM given L gluteal. Tolerated well.  Bandaid applied.  

## 2012-12-24 ENCOUNTER — Ambulatory Visit (INDEPENDENT_AMBULATORY_CARE_PROVIDER_SITE_OTHER): Payer: Medicare Other | Admitting: *Deleted

## 2012-12-24 DIAGNOSIS — G35 Multiple sclerosis: Secondary | ICD-10-CM

## 2012-12-24 NOTE — Progress Notes (Signed)
Pt here for [name] injection.  Under aseptic technique [name] 30mcg/0.5ml IM given L gluteal. Tolerated well.  Bandaid applied.  

## 2012-12-24 NOTE — Patient Instructions (Signed)
Pt to f/u in 2 wks.  Per his request.

## 2012-12-26 ENCOUNTER — Ambulatory Visit: Payer: Self-pay

## 2013-01-07 ENCOUNTER — Ambulatory Visit (INDEPENDENT_AMBULATORY_CARE_PROVIDER_SITE_OTHER): Payer: Medicare Other | Admitting: *Deleted

## 2013-01-07 DIAGNOSIS — G35 Multiple sclerosis: Secondary | ICD-10-CM | POA: Diagnosis not present

## 2013-01-07 NOTE — Progress Notes (Signed)
Pt here for [name] injection.  Under aseptic technique [name] 30mcg/0.5ml IM given L deltoid. Tolerated well.  Bandaid applied.  

## 2013-01-07 NOTE — Patient Instructions (Signed)
Pt to return in one week.

## 2013-01-12 ENCOUNTER — Encounter (HOSPITAL_COMMUNITY): Payer: Self-pay | Admitting: Emergency Medicine

## 2013-01-12 ENCOUNTER — Emergency Department (HOSPITAL_COMMUNITY): Payer: Medicare Other

## 2013-01-12 ENCOUNTER — Inpatient Hospital Stay (HOSPITAL_COMMUNITY)
Admission: EM | Admit: 2013-01-12 | Discharge: 2013-01-15 | DRG: 060 | Disposition: A | Discharge status: Home Health | Payer: Medicare Other | Attending: Internal Medicine | Admitting: Internal Medicine

## 2013-01-12 DIAGNOSIS — R5381 Other malaise: Secondary | ICD-10-CM | POA: Diagnosis present

## 2013-01-12 DIAGNOSIS — J449 Chronic obstructive pulmonary disease, unspecified: Secondary | ICD-10-CM | POA: Diagnosis not present

## 2013-01-12 DIAGNOSIS — E860 Dehydration: Secondary | ICD-10-CM | POA: Diagnosis present

## 2013-01-12 DIAGNOSIS — Z66 Do not resuscitate: Secondary | ICD-10-CM | POA: Diagnosis present

## 2013-01-12 DIAGNOSIS — R5383 Other fatigue: Secondary | ICD-10-CM | POA: Diagnosis not present

## 2013-01-12 DIAGNOSIS — G35 Multiple sclerosis: Secondary | ICD-10-CM | POA: Diagnosis not present

## 2013-01-12 DIAGNOSIS — J069 Acute upper respiratory infection, unspecified: Secondary | ICD-10-CM | POA: Diagnosis present

## 2013-01-12 DIAGNOSIS — R05 Cough: Secondary | ICD-10-CM | POA: Diagnosis not present

## 2013-01-12 DIAGNOSIS — N400 Enlarged prostate without lower urinary tract symptoms: Secondary | ICD-10-CM | POA: Diagnosis not present

## 2013-01-12 DIAGNOSIS — R197 Diarrhea, unspecified: Secondary | ICD-10-CM | POA: Diagnosis present

## 2013-01-12 DIAGNOSIS — R531 Weakness: Secondary | ICD-10-CM

## 2013-01-12 DIAGNOSIS — G35D Multiple sclerosis, unspecified: Principal | ICD-10-CM | POA: Diagnosis present

## 2013-01-12 DIAGNOSIS — G459 Transient cerebral ischemic attack, unspecified: Secondary | ICD-10-CM | POA: Diagnosis not present

## 2013-01-12 DIAGNOSIS — D126 Benign neoplasm of colon, unspecified: Secondary | ICD-10-CM | POA: Diagnosis not present

## 2013-01-12 DIAGNOSIS — N319 Neuromuscular dysfunction of bladder, unspecified: Secondary | ICD-10-CM

## 2013-01-12 DIAGNOSIS — R0602 Shortness of breath: Secondary | ICD-10-CM | POA: Diagnosis not present

## 2013-01-12 DIAGNOSIS — R509 Fever, unspecified: Secondary | ICD-10-CM | POA: Diagnosis present

## 2013-01-12 DIAGNOSIS — M47812 Spondylosis without myelopathy or radiculopathy, cervical region: Secondary | ICD-10-CM | POA: Diagnosis not present

## 2013-01-12 DIAGNOSIS — R059 Cough, unspecified: Secondary | ICD-10-CM | POA: Diagnosis not present

## 2013-01-12 DIAGNOSIS — J309 Allergic rhinitis, unspecified: Secondary | ICD-10-CM | POA: Diagnosis not present

## 2013-01-12 DIAGNOSIS — J4489 Other specified chronic obstructive pulmonary disease: Secondary | ICD-10-CM | POA: Diagnosis present

## 2013-01-12 DIAGNOSIS — K219 Gastro-esophageal reflux disease without esophagitis: Secondary | ICD-10-CM | POA: Diagnosis present

## 2013-01-12 DIAGNOSIS — R404 Transient alteration of awareness: Secondary | ICD-10-CM | POA: Diagnosis not present

## 2013-01-12 HISTORY — DX: Weakness: R53.1

## 2013-01-12 LAB — CBC WITH DIFFERENTIAL/PLATELET
Eosinophils Absolute: 0.1 10*3/uL (ref 0.0–0.7)
Hemoglobin: 14.6 g/dL (ref 13.0–17.0)
Lymphocytes Relative: 13 % (ref 12–46)
Lymphs Abs: 0.8 10*3/uL (ref 0.7–4.0)
MCH: 28.9 pg (ref 26.0–34.0)
MCV: 84.2 fL (ref 78.0–100.0)
Monocytes Relative: 12 % (ref 3–12)
Neutrophils Relative %: 74 % (ref 43–77)
RBC: 5.05 MIL/uL (ref 4.22–5.81)

## 2013-01-12 LAB — URINALYSIS, ROUTINE W REFLEX MICROSCOPIC
Bilirubin Urine: NEGATIVE
Ketones, ur: NEGATIVE mg/dL
Leukocytes, UA: NEGATIVE
Nitrite: NEGATIVE
Protein, ur: NEGATIVE mg/dL
Urobilinogen, UA: 0.2 mg/dL (ref 0.0–1.0)
pH: 5 (ref 5.0–8.0)

## 2013-01-12 LAB — COMPREHENSIVE METABOLIC PANEL
Alkaline Phosphatase: 70 U/L (ref 39–117)
BUN: 19 mg/dL (ref 6–23)
CO2: 24 mEq/L (ref 19–32)
GFR calc Af Amer: >90 mL/min (ref >90)
GFR calc non Af Amer: 87 mL/min — ABNORMAL LOW (ref >90)
Glucose, Bld: 106 mg/dL — ABNORMAL HIGH (ref 70–99)
Potassium: 3.9 mEq/L (ref 3.5–5.1)
Total Bilirubin: 0.2 mg/dL — ABNORMAL LOW (ref 0.3–1.2)
Total Protein: 7 g/dL (ref 6.0–8.3)

## 2013-01-12 MED ORDER — ACETAMINOPHEN 325 MG PO TABS
650.0000 mg | ORAL_TABLET | ORAL | Status: DC | PRN
  Administered 2013-01-12: 650 mg via ORAL
  Filled 2013-01-12: qty 2

## 2013-01-12 MED ORDER — SODIUM CHLORIDE 0.9 % IV SOLN
1000.0000 mL | INTRAVENOUS | Status: DC
  Administered 2013-01-12 – 2013-01-13 (×2): 1000 mL via INTRAVENOUS

## 2013-01-12 NOTE — ED Notes (Signed)
[id number]> Expected date:<BR> Expected time:<BR> Means of arrival:<BR> Comments:<BR> Medic 241, 82 m Gen Weakness

## 2013-01-12 NOTE — ED Notes (Addendum)
Per EMS: Pt is from home. Pt c/o of generalized weakness that started last night and has been unable to walk or get out of bed. Pt has a history of MS. Pt denies N/V/D and pain. Pt fell one week ago and has bruising to left upper arm above the elbow.

## 2013-01-12 NOTE — H&P (Addendum)
PCP:  Margaree Mackintosh, MD  Neurology: [facility]: Logan Bores  Chief Complaint:  Debility and fever  HPI: [name] is a 72 y.o. male   has a past medical history of Multiple sclerosis; BPH (benign prostatic hyperplasia); Prostatitis, chronic; Allergy; GERD (gastroesophageal reflux disease); COPD (chronic obstructive pulmonary disease); Adenomatous polyp; and Neuromuscular disorder.   Presented with  Since yesterday he have not felt well. Today after lunch he felt poorly and had trouble getting up from the sofa. His friend who is an orthopedic surgeon recommended to have him come to ED to be evaluated.  At home he had a slight fever of 101.1. He reports main complaint of a frontal headache. He have had some cough for the past few days. He had one episode of diarrhea yesterday. The weakness does not seemed to be localized.  he has hx of MS and has chronic Right sided weakness. Patient has no sick contacts but he does work out 3 times a week with PT/OT. Per family he have had some problems with short-term memory for some time.   In ER patient has low grade fever CXR and UA that show no evidence of infection. Wife states he now looks better after getting IVF.   Hospitalist called for an admission.   Review of Systems:    Pertinent positives include: Fevers, chills, fatigue, diarrhea, non-productive cough, headaches,   Constitutional:  No weight loss, night sweats, weight loss  HEENT:  No Difficulty swallowing,Tooth/dental problems,Sore throat,  No sneezing, itching, ear ache, nasal congestion, post nasal drip,  Cardio-vascular:  No chest pain, Orthopnea, PND, anasarca, dizziness, palpitations.no Bilateral lower extremity swelling  GI:  No heartburn, indigestion, abdominal pain, nausea, vomiting,  change in bowel habits, loss of appetite, melena, blood in stool, hematemesis Resp:  no shortness of breath at rest. No dyspnea on exertion, No excess mucus, no productive cough, No  No coughing  up of blood.No change in color of mucus.No wheezing. Skin:  no rash or lesions. No jaundice GU:  no dysuria, change in color of urine, no urgency or frequency. No straining to urinate.  No flank pain.  Musculoskeletal:  No joint pain or no joint swelling. No decreased range of motion. No back pain.  Psych:  No change in mood or affect. No depression or anxiety. No memory loss.  Neuro: no localizing neurological complaints, no tingling, no weakness, no double vision, no gait abnormality, no slurred speech, no confusion  Otherwise ROS are negative except for above, 10 systems were reviewed  Past Medical History: Past Medical History  Diagnosis Date  . Multiple sclerosis   . BPH (benign prostatic hyperplasia)   . Prostatitis, chronic   . Allergy   . GERD (gastroesophageal reflux disease)   . COPD (chronic obstructive pulmonary disease)   . Adenomatous polyp   . Neuromuscular disorder     MS   Past Surgical History  Procedure Laterality Date  . Hernia repair  [id number]    3 inguinal hernia repairs on left  . Hernia repair  06/16/11    right inguinal hernia repair with mesh      Medications: Prior to Admission medications   Medication Sig Start Date End Date Taking? Authorizing Provider  amitriptyline (ELAVIL) 25 MG tablet Take 25 mg by mouth at bedtime.     Yes Historical Provider, MD  Ascorbic Acid (VITAMIN C PO) Take by mouth daily.     Yes Historical Provider, MD  aspirin 325 MG tablet Take 325 mg  by mouth daily.     Yes Historical Provider, MD  Cholecalciferol (VITAMIN D PO) Take by mouth daily.     Yes Historical Provider, MD  interferon beta-1a ([name]) 30 MCG injection Inject 30 mcg into the muscle every 7 (seven) days.    Yes Historical Provider, MD  silodosin (RAPAFLO) 8 MG CAPS capsule Take 8 mg by mouth daily with breakfast.   Yes Historical Provider, MD  zolpidem (AMBIEN) 10 MG tablet Take 10 mg by mouth at bedtime.   Yes Historical Provider, MD    Allergies:   No Known Allergies  Social History:  Ambulatory  independently  Able to drive Lives at home with family   reports that he quit smoking about 37 years ago. He has never used smokeless tobacco. He reports that he does not drink alcohol or use illicit drugs.   Family History: family history includes Dementia in his father and Depression in his mother.    Physical Exam: Patient Vitals for the past 24 hrs:  BP Temp Temp src Pulse Resp SpO2  01/12/13 1952 136/74 mmHg 99.3 F (37.4 C) Oral 105 18 94 %    1. General:  in No Acute distress 2. Psychological: Alert and Oriented 3. Head/ENT:     Dry Mucous Membranes                          Head Non traumatic, neck supple, no meningismus                          Normal   Dentition 4. SKIN:   decreased Skin turgor,  Skin clean Dry and intact somewhat flushed.  5. Heart: Regular rate and rhythm no Murmur, Rub or gallop 6. Lungs:  no wheezes or crackles somewhat distant breath sounds 7. Abdomen: Soft, non-tender, Non distended 8. Lower extremities: no clubbing, cyanosis, or edema 9. Neurologically Right foot drop noted and Right hand drift which is chronic and unchanged. Otherwise CN 2-12 intact. Left extremities strength 5/5  10. MSK: Normal range of motion  body mass index is unknown because there is no weight on file.   Labs on Admission:   Recent Labs  01/12/13 2040  NA 135  K 3.9  CL 101  CO2 24  GLUCOSE 106*  BUN 19  CREATININE 0.80  CALCIUM 9.3    Recent Labs  01/12/13 2040  AST 17  ALT 13  ALKPHOS 70  BILITOT 0.2*  PROT 7.0  ALBUMIN 3.6   No results found for this basename: LIPASE, AMYLASE,  in the last 72 hours  Recent Labs  01/12/13 2040  WBC 6.3  NEUTROABS 4.7  HGB 14.6  HCT 42.5  MCV 84.2  PLT 136*   No results found for this basename: CKTOTAL, CKMB, CKMBINDEX, TROPONINI,  in the last 72 hours No results found for this basename: TSH, T4TOTAL, FREET3, T3FREE, THYROIDAB,  in the last 72 hours No  results found for this basename: VITAMINB12, FOLATE, FERRITIN, TIBC, IRON, RETICCTPCT,  in the last 72 hours No results found for this basename: HGBA1C    The CrCl is unknown because both a height and weight (above a minimum accepted value) are required for this calculation. ABG No results found for this basename: phart, pco2, po2, hco3, tco2, acidbasedef, o2sat     No results found for this basename: DDIMER    UA somewhat concentrated  Cultures:    Component Value  Date/Time   SDES PROSTATE 09/29/2008 1408   SPECREQUEST NONE 09/29/2008 1408   CULT RARE ENTEROCOCCUS SPECIES 09/29/2008 1408   REPTSTATUS 10/04/2008 FINAL 09/29/2008 1408       Radiological Exams on Admission: Dg Chest 2 View  01/12/2013  *RADIOLOGY REPORT*  Clinical Data: Weakness, lethargy, nonproductive cough, shortness of breath.  History of MS.  Former smoker.  CHEST - 2 VIEW  Comparison: 06/06/2011  Findings: Normal heart size and pulmonary vascularity.  Mild hyperinflation may represent emphysema or reactive airways disease. Scattered fibrosis in the lungs.  Calcified apical pleural plaques. No focal consolidation or airspace disease.  No blunting of costophrenic angles.  No pneumothorax.  Degenerative changes in the spine.  No significant change since previous study.  IMPRESSION: No evidence of active pulmonary disease.  Mild pulmonary hyperinflation and fibrosis.   Original Report Authenticated By: Burman Nieves, M.D.    Ct Head Wo Contrast  01/12/2013  *RADIOLOGY REPORT*  Clinical Data: Generalized weakness, history of multiple sclerosis per patient  CT HEAD WITHOUT CONTRAST  Technique:  Contiguous axial images were obtained from the base of the skull through the vertex without contrast.  Comparison: [facility] brain MRI 10/05/2010, report by [facility] not available, head CT 05/26/2006 (limited sinuses)  Findings: Allowing for differences in technique, stable degree of cortical volume  loss, proportional ventricular prominence, and patchy periventricular white matter hypodensities.  No acute hemorrhage, acute infarction, or mass lesion is seen.  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  Left maxillary and ethmoid sinusitis.  No skull fracture.  IMPRESSION: Stable degree of cortical volume loss and periventricular white matter hypodensity as above.  However, this could obscure active MS lesions or other abnormalities with this technique, and brain MRI would be helpful if symptoms persist.   Original Report Authenticated By: Christiana Pellant, M.D.     Chart has been reviewed  Assessment/Plan  72 yo M w hx of MS here with low grade fever and generalized fatigue  Present on Admission:  . Generalized weakness -possibly multifactorial in the setting of chorinic MS and possible viral URI and mild dehydration, no localizing symptoms. TIA is much less likely . Multiple sclerosis -  Discussed with neurology who feels at this time since his symptoms have improved with IVF the MS exacerbation is unlikely. Recommend consulting them in AM if he does not return to baseline or develops localized symptoms.  . Fever - possibly due to viral URI, so far no source of infection, patient does not appear to be toxic, Afebrile in ED will hold off on blood cultures.  . Diarrhea - isolated event now resolved will continue to follow.  Headache without sign of meningismus, currently afebrile Mild dehydration - IVF and check orthostatics Prophylaxis:  Lovenox   CODE STATUS: DNR/DNI as per patient's wishes  Other plan as per orders.  I have spent a total of 60 in on this admission, time was taken to discuss patient with Neurology on call.   Waldon Sheerin 01/12/2013, 11:35 PM

## 2013-01-12 NOTE — ED Provider Notes (Signed)
History    CSN: [id number] Arrival date & time 01/12/13  1946 First MD Initiated Contact with Patient 01/12/13 2006      Chief Complaint  Patient presents with  . Weakness    HPI Pt has history of MS.  Today he noticed he was having trouble walking and was very weak.  Usually he can still walk and get around on his own.  He took a temperature today and it was 101.    He has been coughing.  NO vomiting but one loose stool this am.  No pain anywhere.  No dysuria.  Past Medical History  Diagnosis Date  . Multiple sclerosis   . BPH (benign prostatic hyperplasia)   . Prostatitis, chronic   . Allergy   . GERD (gastroesophageal reflux disease)   . COPD (chronic obstructive pulmonary disease)   . Adenomatous polyp   . Neuromuscular disorder     MS    Past Surgical History  Procedure Laterality Date  . Hernia repair  [id number]    3 inguinal hernia repairs on left  . Hernia repair  06/16/11    right inguinal hernia repair with mesh     No family history on file.  History  Substance Use Topics  . Smoking status: Former Smoker    Quit date: 09/20/1975  . Smokeless tobacco: Never Used  . Alcohol Use: No      Review of Systems  Constitutional: Positive for fever.  Cardiovascular: Negative for chest pain.  Genitourinary: Negative for dysuria.  All other systems reviewed and are negative.    Allergies  Review of patient's allergies indicates no known allergies.  Home Medications   Current Outpatient Rx  Name  Route  Sig  Dispense  Refill  . amitriptyline (ELAVIL) 25 MG tablet   Oral   Take 25 mg by mouth at bedtime.           . Ascorbic Acid (VITAMIN C PO)   Oral   Take by mouth daily.           Marland Kitchen aspirin 325 MG tablet   Oral   Take 325 mg by mouth daily.           . Cholecalciferol (VITAMIN D PO)   Oral   Take by mouth daily.           . interferon beta-1a ([name]) 30 MCG injection   Intramuscular   Inject 30 mcg into the muscle every 7 (seven)  days.          . silodosin (RAPAFLO) 8 MG CAPS capsule   Oral   Take 8 mg by mouth daily with breakfast.         . zolpidem (AMBIEN) 10 MG tablet   Oral   Take 10 mg by mouth at bedtime.           BP 136/74  Pulse 105  Temp(Src) 99.3 F (37.4 C) (Oral)  Resp 18  SpO2 94%  Physical Exam  Nursing note and vitals reviewed. Constitutional: He appears well-developed and well-nourished. No distress.  HENT:  Head: Normocephalic and atraumatic.  Right Ear: External ear normal.  Left Ear: External ear normal.  Eyes: Conjunctivae are normal. Right eye exhibits no discharge. Left eye exhibits no discharge. No scleral icterus.  Neck: Neck supple. No tracheal deviation present.  Cardiovascular: Normal rate, regular rhythm and intact distal pulses.   Pulmonary/Chest: Effort normal and breath sounds normal. No stridor. No respiratory distress. He has no wheezes.  He has no rales.  Abdominal: Soft. Bowel sounds are normal. He exhibits no distension. There is no tenderness. There is no rebound and no guarding.  Musculoskeletal: He exhibits no edema and no tenderness.  Bruising left upper extrem  Neurological: He is alert. He is not disoriented. No cranial nerve deficit ( no gross defecits noted) or sensory deficit. He exhibits normal muscle tone. He displays no seizure activity. Gait abnormal. Coordination normal.  General weakness, no focal deficits, able to squeeze with both hands equally, able to lift both legs equally  Skin: Skin is warm and dry. No rash noted.  Psychiatric: He has a normal mood and affect.    ED Course  Procedures (including critical care time)  Labs Reviewed  CBC WITH DIFFERENTIAL - Abnormal; Notable for the following:    Platelets 136 (*)    All other components within normal limits  COMPREHENSIVE METABOLIC PANEL - Abnormal; Notable for the following:    Glucose, Bld 106 (*)    Total Bilirubin 0.2 (*)    GFR calc non Af Amer 87 (*)    All other components  within normal limits  URINALYSIS, ROUTINE W REFLEX MICROSCOPIC   Dg Chest 2 View  01/12/2013  *RADIOLOGY REPORT*  Clinical Data: Weakness, lethargy, nonproductive cough, shortness of breath.  History of MS.  Former smoker.  CHEST - 2 VIEW  Comparison: 06/06/2011  Findings: Normal heart size and pulmonary vascularity.  Mild hyperinflation may represent emphysema or reactive airways disease. Scattered fibrosis in the lungs.  Calcified apical pleural plaques. No focal consolidation or airspace disease.  No blunting of costophrenic angles.  No pneumothorax.  Degenerative changes in the spine.  No significant change since previous study.  IMPRESSION: No evidence of active pulmonary disease.  Mild pulmonary hyperinflation and fibrosis.   Original Report Authenticated By: Burman Nieves, M.D.    Ct Head Wo Contrast  01/12/2013  *RADIOLOGY REPORT*  Clinical Data: Generalized weakness, history of multiple sclerosis per patient  CT HEAD WITHOUT CONTRAST  Technique:  Contiguous axial images were obtained from the base of the skull through the vertex without contrast.  Comparison:  Imaging brain MRI 10/05/2010, report by [facility] not available, head CT 05/26/2006 (limited sinuses)  Findings: Allowing for differences in technique, stable degree of cortical volume loss, proportional ventricular prominence, and patchy periventricular white matter hypodensities.  No acute hemorrhage, acute infarction, or mass lesion is seen.  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  Left maxillary and ethmoid sinusitis.  No skull fracture.  IMPRESSION: Stable degree of cortical volume loss and periventricular white matter hypodensity as above.  However, this could obscure active MS lesions or other abnormalities with this technique, and brain MRI would be helpful if symptoms persist.   Original Report Authenticated By: Christiana Pellant, M.D.      1. Weakness   2. MS (multiple sclerosis)        MDM  Pt has persistent weakness.  He attempted to ambulate in the ED but had significant difficulty.  Symptoms most likely related to MS flare.  Pt will benefit from admission.  Possible mri and neurological consultation.        Celene Kras, MD 01/12/13 [phone]

## 2013-01-13 ENCOUNTER — Inpatient Hospital Stay (HOSPITAL_COMMUNITY): Payer: Medicare Other

## 2013-01-13 DIAGNOSIS — D126 Benign neoplasm of colon, unspecified: Secondary | ICD-10-CM

## 2013-01-13 DIAGNOSIS — E860 Dehydration: Secondary | ICD-10-CM | POA: Diagnosis present

## 2013-01-13 DIAGNOSIS — J309 Allergic rhinitis, unspecified: Secondary | ICD-10-CM

## 2013-01-13 DIAGNOSIS — N400 Enlarged prostate without lower urinary tract symptoms: Secondary | ICD-10-CM

## 2013-01-13 LAB — CBC
MCV: 84.4 fL (ref 78.0–100.0)
Platelets: 117 10*3/uL — ABNORMAL LOW (ref 150–400)
RBC: 5.07 MIL/uL (ref 4.22–5.81)
WBC: 6.8 10*3/uL (ref 4.0–10.5)

## 2013-01-13 LAB — LIPID PANEL
HDL: 47 mg/dL (ref >39)
LDL Cholesterol: 122 mg/dL — ABNORMAL HIGH (ref 0–99)
Total CHOL/HDL Ratio: 3.9 RATIO

## 2013-01-13 LAB — GLUCOSE, CAPILLARY: Glucose-Capillary: 132 mg/dL — ABNORMAL HIGH (ref 70–99)

## 2013-01-13 LAB — COMPREHENSIVE METABOLIC PANEL
Albumin: 3.4 g/dL — ABNORMAL LOW (ref 3.5–5.2)
BUN: 17 mg/dL (ref 6–23)
Calcium: 8.9 mg/dL (ref 8.4–10.5)
Chloride: 100 mEq/L (ref 96–112)
Creatinine, Ser: 0.8 mg/dL (ref 0.50–1.35)
GFR calc non Af Amer: 87 mL/min — ABNORMAL LOW (ref >90)
Total Bilirubin: 0.3 mg/dL (ref 0.3–1.2)

## 2013-01-13 LAB — PHOSPHORUS: Phosphorus: 3.5 mg/dL (ref 2.3–4.6)

## 2013-01-13 MED ORDER — SODIUM CHLORIDE 0.9 % IV SOLN
1000.0000 mL | INTRAVENOUS | Status: AC
  Administered 2013-01-13 (×2): 1000 mL via INTRAVENOUS

## 2013-01-13 MED ORDER — INSULIN ASPART 100 UNIT/ML ~~LOC~~ SOLN: 0.0000 [IU] | Freq: Every day | SUBCUTANEOUS | Status: DC

## 2013-01-13 MED ORDER — ALBUTEROL SULFATE (5 MG/ML) 0.5% IN NEBU: 2.5000 mg | INHALATION_SOLUTION | RESPIRATORY_TRACT | Status: DC | PRN

## 2013-01-13 MED ORDER — INSULIN ASPART 100 UNIT/ML ~~LOC~~ SOLN
0.0000 [IU] | Freq: Three times a day (TID) | SUBCUTANEOUS | Status: DC
  Administered 2013-01-13: 2 [IU] via SUBCUTANEOUS
  Administered 2013-01-13 – 2013-01-14 (×2): 1 [IU] via SUBCUTANEOUS

## 2013-01-13 MED ORDER — SODIUM CHLORIDE 0.9 % IV SOLN
1000.0000 mg | Freq: Every day | INTRAVENOUS | Status: DC
  Administered 2013-01-13 – 2013-01-14 (×2): 1000 mg via INTRAVENOUS
  Filled 2013-01-13 (×4): qty 8

## 2013-01-13 MED ORDER — DOCUSATE SODIUM 100 MG PO CAPS
100.0000 mg | ORAL_CAPSULE | Freq: Two times a day (BID) | ORAL | Status: DC
  Administered 2013-01-13 – 2013-01-15 (×5): 100 mg via ORAL
  Filled 2013-01-13 (×6): qty 1

## 2013-01-13 MED ORDER — ONDANSETRON HCL 4 MG PO TABS: 4.0000 mg | ORAL_TABLET | Freq: Four times a day (QID) | ORAL | Status: DC | PRN

## 2013-01-13 MED ORDER — SODIUM CHLORIDE 0.9 % IJ SOLN
3.0000 mL | Freq: Two times a day (BID) | INTRAMUSCULAR | Status: DC
  Administered 2013-01-14: 3 mL via INTRAVENOUS

## 2013-01-13 MED ORDER — ZOLPIDEM TARTRATE 10 MG PO TABS: 10.0000 mg | ORAL_TABLET | Freq: Every day | ORAL | Status: DC

## 2013-01-13 MED ORDER — HYDROCODONE-ACETAMINOPHEN 5-325 MG PO TABS: 1.0000 | ORAL_TABLET | ORAL | Status: DC | PRN

## 2013-01-13 MED ORDER — ONDANSETRON HCL 4 MG/2ML IJ SOLN: 4.0000 mg | Freq: Four times a day (QID) | INTRAMUSCULAR | Status: DC | PRN

## 2013-01-13 MED ORDER — GUAIFENESIN ER 600 MG PO TB12
600.0000 mg | ORAL_TABLET | Freq: Two times a day (BID) | ORAL | Status: DC
  Administered 2013-01-13 – 2013-01-15 (×5): 600 mg via ORAL
  Filled 2013-01-13 (×6): qty 1

## 2013-01-13 MED ORDER — ZOLPIDEM TARTRATE 5 MG PO TABS
5.0000 mg | ORAL_TABLET | Freq: Every day | ORAL | Status: DC
  Administered 2013-01-13 – 2013-01-14 (×3): 5 mg via ORAL
  Filled 2013-01-13 (×3): qty 1

## 2013-01-13 MED ORDER — ENOXAPARIN SODIUM 40 MG/0.4ML ~~LOC~~ SOLN
40.0000 mg | SUBCUTANEOUS | Status: DC
  Administered 2013-01-13 – 2013-01-14 (×2): 40 mg via SUBCUTANEOUS
  Filled 2013-01-13 (×3): qty 0.4

## 2013-01-13 MED ORDER — ASPIRIN 325 MG PO TABS
325.0000 mg | ORAL_TABLET | Freq: Every day | ORAL | Status: DC
  Administered 2013-01-13 – 2013-01-15 (×3): 325 mg via ORAL
  Filled 2013-01-13 (×3): qty 1

## 2013-01-13 MED ORDER — AMITRIPTYLINE HCL 25 MG PO TABS
25.0000 mg | ORAL_TABLET | Freq: Every day | ORAL | Status: DC
  Administered 2013-01-13 – 2013-01-14 (×3): 25 mg via ORAL
  Filled 2013-01-13 (×4): qty 1

## 2013-01-13 MED ORDER — LEVOFLOXACIN 750 MG PO TABS
750.0000 mg | ORAL_TABLET | Freq: Every day | ORAL | Status: DC
  Administered 2013-01-13 – 2013-01-15 (×3): 750 mg via ORAL
  Filled 2013-01-13 (×3): qty 1

## 2013-01-13 MED ORDER — GADOBENATE DIMEGLUMINE 529 MG/ML IV SOLN
18.0000 mL | Freq: Once | INTRAVENOUS | Status: AC | PRN
  Administered 2013-01-13: 18 mL via INTRAVENOUS

## 2013-01-13 MED ORDER — TAMSULOSIN HCL 0.4 MG PO CAPS
0.4000 mg | ORAL_CAPSULE | Freq: Every day | ORAL | Status: DC
  Administered 2013-01-13 – 2013-01-15 (×3): 0.4 mg via ORAL
  Filled 2013-01-13 (×3): qty 1

## 2013-01-13 NOTE — Progress Notes (Signed)
ANTIBIOTIC CONSULT NOTE - INITIAL  Pharmacy Consult for Levaquin PO Indication: URI  No Known Allergies  Patient Measurements: Height: 6\' 1"  (185.4 cm) Weight: 171 lb 3.2 oz (77.656 kg) IBW/kg (Calculated) : 79.9  Vital Signs: Temp: 99.9 F (37.7 C) (04/27 1000) Temp src: Oral (04/27 1000) BP: 123/77 mmHg (04/27 1100) Pulse Rate: 105 (04/27 1100) Intake/Output from previous day: 04/26 0701 - 04/27 0700 In: 1166.7 [I.V.:1166.7] Out: 425 [Urine:425] Intake/Output from this shift: Total I/O In: -  Out: 450 [Urine:450]  Labs:  Recent Labs  01/12/13 2040 01/13/13 0515  WBC 6.3 6.8  HGB 14.6 15.1  PLT 136* 117*  CREATININE 0.80 0.80   Estimated Creatinine Clearance: 91.7 ml/min (by C-G formula based on Cr of 0.8). No results found for this basename: VANCOTROUGH, VANCOPEAK, VANCORANDOM, GENTTROUGH, GENTPEAK, GENTRANDOM, TOBRATROUGH, TOBRAPEAK, TOBRARND, AMIKACINPEAK, AMIKACINTROU, AMIKACIN,  in the last 72 hours   Microbiology: No results found for this or any previous visit (from the past 720 hour(s)).  Medical History: Past Medical History  Diagnosis Date  . Multiple sclerosis   . BPH (benign prostatic hyperplasia)   . Prostatitis, chronic   . Allergy   . GERD (gastroesophageal reflux disease)   . COPD (chronic obstructive pulmonary disease)   . Adenomatous polyp   . Neuromuscular disorder     MS    Medications:  Scheduled:  . amitriptyline  25 mg Oral QHS  . aspirin  325 mg Oral Daily  . docusate sodium  100 mg Oral BID  . enoxaparin (LOVENOX) injection  40 mg Subcutaneous Q24H  . guaiFENesin  600 mg Oral BID  . insulin aspart  0-5 Units Subcutaneous QHS  . insulin aspart  0-9 Units Subcutaneous TID WC  . levofloxacin  750 mg Oral Daily  . methylPREDNISolone (SOLU-MEDROL) injection  1,000 mg Intravenous Daily  . sodium chloride  3 mL Intravenous Q12H  . tamsulosin  0.4 mg Oral Daily  . zolpidem  5 mg Oral QHS  . [DISCONTINUED] zolpidem  10 mg Oral  QHS   Assessment:  72yo M with MS admitted with fever and weakness.  Pharmacy asked to dose PO Levaquin for suspected upper respiratory infection.  SCr wnl, CrCl ~92.  Plan:   Levaquin 750mg  PO q24h.  Pharmacy will sign-off.  Charolotte Eke, PharmD, pager [phone]. 01/13/2013,5:41 PM.

## 2013-01-13 NOTE — Progress Notes (Signed)
Pt off tele to travel to MRI.

## 2013-01-13 NOTE — Consult Note (Signed)
NEURO HOSPITALIST CONSULT NOTE    Reason for Consult: right hemiparesis.  HPI:                                                                                                                                          [name] is an 72 y.o. male, right handed, with a past medical history significant for PP-MS, BPH, COPD, admitted to [facility] with right hemiparesis. 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 Although his cognition has been declining lately and has poor balance, he is quite well in term of his ability to function independently.Tried several disease modifying therapies in the past but is able to tolerate avonex better. Patient's wife tells me that " usually everything related to his MS is in the right side" but yesterday afternoon he suddenly became weaker in the right side to the point that he couldn't get up and walk. No reported falls. No prior MS relapses. He had a fever but no frank infection found. Denies headache, vertigo, double vision, difficulty swallowing, slurred speech, language or visual impairment. Chronic issues with bladder control. Pain is not an issue. A neurological consultation was requested to further explore patient's right hemiparesis.    Past Medical History  Diagnosis Date  . Multiple sclerosis   . BPH (benign prostatic hyperplasia)   . Prostatitis, chronic   . Allergy   . GERD (gastroesophageal reflux disease)   . COPD (chronic obstructive pulmonary disease)   . Adenomatous polyp   . Neuromuscular disorder     MS    Past Surgical History  Procedure Laterality Date  . Hernia repair  [id number]    3 inguinal hernia repairs on left  . Hernia repair  06/16/11    right inguinal hernia repair with mesh     Family History  Problem Relation Age of Onset  . Depression Mother   . Dementia Father     Family History:  Social History:  reports  that he quit smoking about 37 years ago. He has never used smokeless tobacco. He reports that he does not drink alcohol or use illicit drugs.  No Known Allergies  MEDICATIONS:  I have reviewed the patient's current medications.   ROS:                                                                                                                                       History obtained from chart review and the patient and wife.  General ROS: negative for - chills, night sweats, weight gain or weight loss Psychological ROS: negative for - behavioral disorder, hallucinations, memory difficulties, mood swings or suicidal ideation Ophthalmic ROS: negative for - blurry vision, double vision, eye pain or loss of vision ENT ROS: negative for - epistaxis, nasal discharge, oral lesions, sore throat, tinnitus or vertigo Allergy and Immunology ROS: negative for - hives or itchy/watery eyes Hematological and Lymphatic ROS: negative for - bleeding problems, bruising or swollen lymph nodes Endocrine ROS: negative for - galactorrhea, hair pattern changes, polydipsia/polyuria or temperature intolerance Respiratory ROS: negative for - hemoptysis, shortness of breath or wheezing Cardiovascular ROS: negative for - chest pain, dyspnea on exertion, edema or irregular heartbeat Gastrointestinal ROS: negative for - abdominal pain, diarrhea, hematemesis, nausea/vomiting or stool incontinence Genito-Urinary ROS: negative for - dysuria, hematuria Musculoskeletal ROS: negative for - joint swelling Neurological ROS: as noted in HPI Dermatological ROS: negative for rash and skin lesion changes      Physical exam: pleasant male in no apparent distress. Blood pressure 143/43, pulse 103, temperature 99.5 F (37.5 C), temperature source Oral, resp. rate 18, height 6\' 1"  (1.854 m), weight 77.656 kg (171 lb  3.2 oz), SpO2 97.00%. Head: normocephalic. Neck: supple, no bruits, no JVD. Cardiac: no murmurs. Lungs: clear. Abdomen: soft, no tender, no mass. Extremities: no edema.  Neurologic Examination:                                                                                                      Mental Status: Alert, awake, oriented, thought content appropriate.  Speech fluent without evidence of aphasia.  Able to follow 3 step commands without difficulty. Cranial Nerves: II: Discs flat bilaterally; Visual fields grossly normal, pupils equal with a left APD III,IV, VI: ptosis not present, extra-ocular motions intact bilaterally V,VII: smile symmetric, facial light touch sensation normal bilaterally VIII: hearing normal bilaterally IX,X: gag reflex present XI: bilateral shoulder shrug XII: midline tongue extension Motor: Significant for mild right hemiparesis. Chronic weakness right dorsiflexors. Tone and bulk:normal tone throughout; no atrophy noted Sensory: Pinprick and light touch intact throughout, bilaterally Deep Tendon Reflexes: hyperreflexia throughout Plantars: Right: downgoing   Left: downgoing  Cerebellar: normal finger-to-nose,  normal heel-to-shin test Gait:  No tested. CV: pulses palpable throughout    Lab Results  Component Value Date/Time   CHOL 229* 05/03/2012  9:15 AM    Results for orders placed during the hospital encounter of 01/12/13 (from the past 48 hour(s))  URINALYSIS, ROUTINE W REFLEX MICROSCOPIC     Status: None   Collection Time    01/12/13  8:36 PM      Result Value Range   Color, Urine YELLOW  YELLOW   APPearance CLEAR  CLEAR   Specific Gravity, Urine 1.028  1.005 - 1.030   pH 5.0  5.0 - 8.0   Glucose, UA NEGATIVE  NEGATIVE mg/dL   Hgb urine dipstick NEGATIVE  NEGATIVE   Bilirubin Urine NEGATIVE  NEGATIVE   Ketones, ur NEGATIVE  NEGATIVE mg/dL   Protein, ur NEGATIVE  NEGATIVE mg/dL   Urobilinogen, UA 0.2  0.0 - 1.0 mg/dL   Nitrite  NEGATIVE  NEGATIVE   Leukocytes, UA NEGATIVE  NEGATIVE   Comment: MICROSCOPIC NOT DONE ON URINES WITH NEGATIVE PROTEIN, BLOOD, LEUKOCYTES, NITRITE, OR GLUCOSE <1000 mg/dL.  CBC WITH DIFFERENTIAL     Status: Abnormal   Collection Time    01/12/13  8:40 PM      Result Value Range   WBC 6.3  4.0 - 10.5 K/uL   RBC 5.05  4.22 - 5.81 MIL/uL   Hemoglobin 14.6  13.0 - 17.0 g/dL   HCT 11.9  14.7 - 82.9 %   MCV 84.2  78.0 - 100.0 fL   MCH 28.9  26.0 - 34.0 pg   MCHC 34.4  30.0 - 36.0 g/dL   RDW 56.2  13.0 - 86.5 %   Platelets 136 (*) 150 - 400 K/uL   Neutrophils Relative 74  43 - 77 %   Neutro Abs 4.7  1.7 - 7.7 K/uL   Lymphocytes Relative 13  12 - 46 %   Lymphs Abs 0.8  0.7 - 4.0 K/uL   Monocytes Relative 12  3 - 12 %   Monocytes Absolute 0.8  0.1 - 1.0 K/uL   Eosinophils Relative 1  0 - 5 %   Eosinophils Absolute 0.1  0.0 - 0.7 K/uL   Basophils Relative 0  0 - 1 %   Basophils Absolute 0.0  0.0 - 0.1 K/uL  COMPREHENSIVE METABOLIC PANEL     Status: Abnormal   Collection Time    01/12/13  8:40 PM      Result Value Range   Sodium 135  135 - 145 mEq/L   Potassium 3.9  3.5 - 5.1 mEq/L   Chloride 101  96 - 112 mEq/L   CO2 24  19 - 32 mEq/L   Glucose, Bld 106 (*) 70 - 99 mg/dL   BUN 19  6 - 23 mg/dL   Creatinine, Ser 7.84  0.50 - 1.35 mg/dL   Calcium 9.3  8.4 - 69.6 mg/dL   Total Protein 7.0  6.0 - 8.3 g/dL   Albumin 3.6  3.5 - 5.2 g/dL   AST 17  0 - 37 U/L   ALT 13  0 - 53 U/L   Alkaline Phosphatase 70  39 - 117 U/L   Total Bilirubin 0.2 (*) 0.3 - 1.2 mg/dL   GFR calc non Af Amer 87 (*) >90 mL/min   GFR calc Af Amer >90  >90 mL/min   Comment:            The eGFR  has been calculated     using the CKD EPI equation.     This calculation has not been     validated in all clinical     situations.     eGFR's persistently     <90 mL/min signify     possible Chronic Kidney Disease.  MAGNESIUM     Status: None   Collection Time    01/13/13  5:15 AM      Result Value Range    Magnesium 2.0  1.5 - 2.5 mg/dL  PHOSPHORUS     Status: None   Collection Time    01/13/13  5:15 AM      Result Value Range   Phosphorus 3.5  2.3 - 4.6 mg/dL  COMPREHENSIVE METABOLIC PANEL     Status: Abnormal   Collection Time    01/13/13  5:15 AM      Result Value Range   Sodium 136  135 - 145 mEq/L   Potassium 4.2  3.5 - 5.1 mEq/L   Chloride 100  96 - 112 mEq/L   CO2 25  19 - 32 mEq/L   Glucose, Bld 87  70 - 99 mg/dL   BUN 17  6 - 23 mg/dL   Creatinine, Ser 1.61  0.50 - 1.35 mg/dL   Calcium 8.9  8.4 - 09.6 mg/dL   Total Protein 6.6  6.0 - 8.3 g/dL   Albumin 3.4 (*) 3.5 - 5.2 g/dL   AST 17  0 - 37 U/L   ALT 12  0 - 53 U/L   Alkaline Phosphatase 67  39 - 117 U/L   Total Bilirubin 0.3  0.3 - 1.2 mg/dL   GFR calc non Af Amer 87 (*) >90 mL/min   GFR calc Af Amer >90  >90 mL/min   Comment:            The eGFR has been calculated     using the CKD EPI equation.     This calculation has not been     validated in all clinical     situations.     eGFR's persistently     <90 mL/min signify     possible Chronic Kidney Disease.  CBC     Status: Abnormal   Collection Time    01/13/13  5:15 AM      Result Value Range   WBC 6.8  4.0 - 10.5 K/uL   RBC 5.07  4.22 - 5.81 MIL/uL   Hemoglobin 15.1  13.0 - 17.0 g/dL   HCT 04.5  40.9 - 81.1 %   MCV 84.4  78.0 - 100.0 fL   MCH 29.8  26.0 - 34.0 pg   MCHC 35.3  30.0 - 36.0 g/dL   RDW 91.4  78.2 - 95.6 %   Platelets 117 (*) 150 - 400 K/uL   Comment: CONSISTENT WITH PREVIOUS RESULT    Dg Chest 2 View  01/12/2013  *RADIOLOGY REPORT*  Clinical Data: Weakness, lethargy, nonproductive cough, shortness of breath.  History of MS.  Former smoker.  CHEST - 2 VIEW  Comparison: 06/06/2011  Findings: Normal heart size and pulmonary vascularity.  Mild hyperinflation may represent emphysema or reactive airways disease. Scattered fibrosis in the lungs.  Calcified apical pleural plaques. No focal consolidation or airspace disease.  No blunting of  costophrenic angles.  No pneumothorax.  Degenerative changes in the spine.  No significant change since previous study.  IMPRESSION: No evidence of active pulmonary disease.  Mild pulmonary  hyperinflation and fibrosis.   Original Report Authenticated By: Burman Nieves, M.D.    Ct Head Wo Contrast  01/12/2013  *RADIOLOGY REPORT*  Clinical Data: Generalized weakness, history of multiple sclerosis per patient  CT HEAD WITHOUT CONTRAST  Technique:  Contiguous axial images were obtained from the base of the skull through the vertex without contrast.  Comparison: [facility] brain MRI 10/05/2010, report by [facility] not available, head CT 05/26/2006 (limited sinuses)  Findings: Allowing for differences in technique, stable degree of cortical volume loss, proportional ventricular prominence, and patchy periventricular white matter hypodensities.  No acute hemorrhage, acute infarction, or mass lesion is seen.  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  Left maxillary and ethmoid sinusitis.  No skull fracture.  IMPRESSION: Stable degree of cortical volume loss and periventricular white matter hypodensity as above.  However, this could obscure active MS lesions or other abnormalities with this technique, and brain MRI would be helpful if symptoms persist.   Original Report Authenticated By: Christiana Pellant, M.D.      Assessment/Plan: Probable MS exacerbation. However, an acute cerebrovascular insult involving left subcortical structures must be excluded, as it is well known that MS relapses are not very frequent in secondary or primary progressive MS. MRI brain and cervical spine pending. I am in favor of starting IV solumedrol 1 mg x 3 days, pending the neuroimaging results.  Continue weekly avonex injection. Will follow up.   Wyatt Portela ,MD [facility] [phone]  01/13/2013, 10:44 AM

## 2013-01-13 NOTE — ED Notes (Signed)
Per Dr. Adela Glimpse, may transfer to 4th floor off telemetry.

## 2013-01-13 NOTE — Progress Notes (Signed)
[facility]                                                                                Patient Demographics  [name], is a 72 y.o. male, DOB - 21-Jan-1941, ZHY:[id number], EXB:[id number]  Admit date - 01/12/2013  Admitting Physician Therisa Doyne, MD  Outpatient Primary MD for the patient is Margaree Mackintosh, MD  LOS - 1   Chief Complaint  Patient presents with  . Weakness        Assessment & Plan    1. Right-sided weakness which is acute on chronic. Patient has history of multiple sclerosis for many years with chronic right-sided weakness which is definitely worse over the last 3-4 days, made him walk in the hallway where he was dragging his right foot, CT of the brain is unremarkable, will order MRI of brain and C-spine with contrast, aspirin to be continued, will check PT evaluation, neurology requested to see the patient. For now he will be placed on Solu-Medrol 1 g daily for 3 days as this is presumed to be a hemisphere. We'll also check A1c and lipid panel in case this turns out to be a stroke. Note patient is on [name] which has been held will defer it to neurology.  No results found for this basename: HGBA1C    Lipid Panel     Component Value Date/Time   CHOL 229* 05/03/2012 0915   TRIG 104 05/03/2012 0915   HDL 48 05/03/2012 0915   CHOLHDL 4.8 05/03/2012 0915   VLDL 21 05/03/2012 0915   LDLCALC 160* 05/03/2012 0915      2. Low-grade fever upon admission which has resolved, no cough, chest x-ray and UA unremarkable, currently afebrile, continue to monitor. Could have had a mild URI which could have exacerbated his underlying MS.    3. Mild dehydration improved with IV fluids continue gently.     Code Status: DNR  Family Communication: Patient  Disposition Plan: Home   Procedures CT brain, MRI brain and C-spine with contrast ordered   Consults Neuro, PT   DVT Prophylaxis  Lovenox   Lab Results  Component Value Date   PLT 117*  01/13/2013    Medications  Scheduled Meds: . amitriptyline  25 mg Oral QHS  . aspirin  325 mg Oral Daily  . docusate sodium  100 mg Oral BID  . enoxaparin (LOVENOX) injection  40 mg Subcutaneous Q24H  . guaiFENesin  600 mg Oral BID  . insulin aspart  0-5 Units Subcutaneous QHS  . insulin aspart  0-9 Units Subcutaneous TID WC  . methylPREDNISolone (SOLU-MEDROL) injection  1,000 mg Intravenous Daily  . sodium chloride  3 mL Intravenous Q12H  . tamsulosin  0.4 mg Oral Daily  . zolpidem  5 mg Oral QHS   Continuous Infusions: . sodium chloride     PRN Meds:.acetaminophen, albuterol, HYDROcodone-acetaminophen, ondansetron (ZOFRAN) IV, ondansetron  Antibiotics     Anti-infectives   None       Time Spent in minutes   35   Susa Raring K M.D on 01/13/2013 at 8:28 AM  Between 7am to 7pm - Pager - [phone]  After 7pm go to [email or web address] - password  [email or web address]  And look for the night coverage person covering for me after hours  [facility] Office  [phone]    Subjective:   [name] today has, No headache, No chest pain, No abdominal pain - No Nausea, No new weakness tingling or numbness, No Cough - SOB.  Worse right-sided weakness  Objective:   Filed Vitals:   01/12/13 2352 01/13/13 0028 01/13/13 0130 01/13/13 0456  BP:  132/81 143/71 143/43  Pulse:  95 94 103  Temp: 99.3 F (37.4 C) 98.6 F (37 C) 99.3 F (37.4 C) 99.5 F (37.5 C)  TempSrc: Oral Oral Oral Oral  Resp:  20 18 18   Height:   6\' 1"  (1.854 m)   Weight:   77.656 kg (171 lb 3.2 oz)   SpO2:  98% 97% 97%    Wt Readings from Last 3 Encounters:  01/13/13 77.656 kg (171 lb 3.2 oz)  05/07/12 83.915 kg (185 lb)  06/29/11 87.091 kg (192 lb)     Intake/Output Summary (Last 24 hours) at 01/13/13 0828 Last data filed at 01/13/13 0600  Gross per 24 hour  Intake 1166.67 ml  Output    425 ml  Net 741.67 ml    Exam Awake Alert, Oriented X 3, Normal affect, right side strength is 4/5  patient does have chronic weakness on the right side but this is definitely worse [name].AT,PERRAL Supple Neck,No JVD, No cervical lymphadenopathy appriciated.  Symmetrical Chest wall movement, Good air movement bilaterally, CTAB RRR,No Gallops,Rubs or new Murmurs, No Parasternal Heave +ve B.Sounds, Abd Soft, Non tender, No organomegaly appriciated, No rebound - guarding or rigidity. No Cyanosis, Clubbing or edema, No new Rash or bruise     Data Review   Micro Results No results found for this or any previous visit (from the past 240 hour(s)).  Radiology Reports Dg Chest 2 View  01/12/2013  *RADIOLOGY REPORT*  Clinical Data: Weakness, lethargy, nonproductive cough, shortness of breath.  History of MS.  Former smoker.  CHEST - 2 VIEW  Comparison: 06/06/2011  Findings: Normal heart size and pulmonary vascularity.  Mild hyperinflation may represent emphysema or reactive airways disease. Scattered fibrosis in the lungs.  Calcified apical pleural plaques. No focal consolidation or airspace disease.  No blunting of costophrenic angles.  No pneumothorax.  Degenerative changes in the spine.  No significant change since previous study.  IMPRESSION: No evidence of active pulmonary disease.  Mild pulmonary hyperinflation and fibrosis.   Original Report Authenticated By: Burman Nieves, M.D.    Ct Head Wo Contrast  01/12/2013  *RADIOLOGY REPORT*  Clinical Data: Generalized weakness, history of multiple sclerosis per patient  CT HEAD WITHOUT CONTRAST  Technique:  Contiguous axial images were obtained from the base of the skull through the vertex without contrast.  Comparison: [facility] brain MRI 10/05/2010, report by [facility] not available, head CT 05/26/2006 (limited sinuses)  Findings: Allowing for differences in technique, stable degree of cortical volume loss, proportional ventricular prominence, and patchy periventricular white matter hypodensities.  No acute hemorrhage,  acute infarction, or mass lesion is seen.  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  Left maxillary and ethmoid sinusitis.  No skull fracture.  IMPRESSION: Stable degree of cortical volume loss and periventricular white matter hypodensity as above.  However, this could obscure active MS lesions or other abnormalities with this technique, and brain MRI would be helpful if symptoms persist.   Original Report Authenticated By: Christiana Pellant, M.D.     CBC  Recent Labs Lab 01/12/13 2040 01/13/13 0515  WBC 6.3 6.8  HGB 14.6 15.1  HCT 42.5 42.8  PLT 136* 117*  MCV 84.2 84.4  MCH 28.9 29.8  MCHC 34.4 35.3  RDW 14.9 15.0  LYMPHSABS 0.8  --   MONOABS 0.8  --   EOSABS 0.1  --   BASOSABS 0.0  --     Chemistries   Recent Labs Lab 01/12/13 2040 01/13/13 0515  NA 135 136  K 3.9 4.2  CL 101 100  CO2 24 25  GLUCOSE 106* 87  BUN 19 17  CREATININE 0.80 0.80  CALCIUM 9.3 8.9  MG  --  2.0  AST 17 17  ALT 13 12  ALKPHOS 70 67  BILITOT 0.2* 0.3   ------------------------------------------------------------------------------------------------------------------ estimated creatinine clearance is 91.7 ml/min (by C-G formula based on Cr of 0.8). ------------------------------------------------------------------------------------------------------------------ No results found for this basename: HGBA1C,  in the last 72 hours ------------------------------------------------------------------------------------------------------------------ No results found for this basename: CHOL, HDL, LDLCALC, TRIG, CHOLHDL, LDLDIRECT,  in the last 72 hours ------------------------------------------------------------------------------------------------------------------ No results found for this basename: TSH, T4TOTAL, FREET3, T3FREE, THYROIDAB,  in the last 72 hours ------------------------------------------------------------------------------------------------------------------ No results found  for this basename: VITAMINB12, FOLATE, FERRITIN, TIBC, IRON, RETICCTPCT,  in the last 72 hours  Coagulation profile No results found for this basename: INR, PROTIME,  in the last 168 hours  No results found for this basename: DDIMER,  in the last 72 hours  Cardiac Enzymes No results found for this basename: CK, CKMB, TROPONINI, MYOGLOBIN,  in the last 168 hours ------------------------------------------------------------------------------------------------------------------ No components found with this basename: POCBNP,

## 2013-01-14 ENCOUNTER — Encounter: Payer: Self-pay | Admitting: *Deleted

## 2013-01-14 LAB — BASIC METABOLIC PANEL
Calcium: 9 mg/dL (ref 8.4–10.5)
GFR calc Af Amer: >90 mL/min (ref >90)
GFR calc non Af Amer: 87 mL/min — ABNORMAL LOW (ref >90)
Glucose, Bld: 115 mg/dL — ABNORMAL HIGH (ref 70–99)
Potassium: 3.8 mEq/L (ref 3.5–5.1)
Sodium: 137 mEq/L (ref 135–145)

## 2013-01-14 LAB — CBC
MCH: 29.6 pg (ref 26.0–34.0)
MCHC: 35.5 g/dL (ref 30.0–36.0)
Platelets: 127 10*3/uL — ABNORMAL LOW (ref 150–400)
RDW: 14.6 % (ref 11.5–15.5)

## 2013-01-14 LAB — GLUCOSE, CAPILLARY: Glucose-Capillary: 81 mg/dL (ref 70–99)

## 2013-01-14 MED ORDER — RANITIDINE HCL 150 MG/10ML PO SYRP
150.0000 mg | ORAL_SOLUTION | Freq: Once | ORAL | Status: AC
  Administered 2013-01-15: 150 mg via ORAL
  Filled 2013-01-14: qty 10

## 2013-01-14 NOTE — Progress Notes (Signed)
Echocardiogram 2D Echocardiogram has been performed.  [name] 01/14/2013, 3:27 PM

## 2013-01-14 NOTE — Progress Notes (Signed)
CSW received consult for SNF placement, though per PT recommendation patient plans to return home with outpatient PT services at discharge. CSW signing off.   Unice Bailey, LCSW [facility] Clinical Social Worker cell #: [phone]

## 2013-01-14 NOTE — Progress Notes (Signed)
*  PRELIMINARY RESULTS* Vascular Ultrasound Carotid Duplex (Doppler) has been completed.  Preliminary findings: Bilateral:  No evidence of hemodynamically significant internal carotid artery stenosis.   Vertebral artery flow is antegrade.      Farrel Demark, RDMS, RVT  01/14/2013, 9:43 AM

## 2013-01-14 NOTE — Progress Notes (Addendum)
NEURO HOSPITALIST PROGRESS NOTE   SUBJECTIVE:                                                                                                                        Feels back to his baseline.  No weakness. Wants to go home to exercise, but has agreed to stay one day to finish TIA workup  OBJECTIVE:                                                                                                                           Vital signs in last 24 hours: Temp:  [97.5 F (36.4 C)-99.9 F (37.7 C)] 97.5 F (36.4 C) (04/28 0621) Pulse Rate:  [82-107] 102 (04/28 0621) Resp:  [18-20] 18 (04/28 0621) BP: (113-143)/(61-77) 143/75 mmHg (04/28 0621) SpO2:  [97 %-100 %] 97 % (04/28 0621)  Intake/Output from previous day: 04/27 0701 - 04/28 0700 In: 1601.3 [P.O.:240; I.V.:1361.3] Out: 1450 [Urine:1450] Intake/Output this shift:   Nutritional status: Cardiac  Past Medical History  Diagnosis Date  . Multiple sclerosis   . BPH (benign prostatic hyperplasia)   . Prostatitis, chronic   . Allergy   . GERD (gastroesophageal reflux disease)   . COPD (chronic obstructive pulmonary disease)   . Adenomatous polyp   . Neuromuscular disorder     MS     Neurologic Exam:  Mental Status: Alert, oriented, thought content appropriate.  Speech fluent without evidence of aphasia.  Able to follow 3 step commands without difficulty. Cranial Nerves: II: Visual fields grossly normal, pupils equal, round, reactive to light and accommodation III,IV, VI: ptosis not present, extra-ocular motions intact bilaterally V,VII: smile symmetric, facial light touch sensation normal bilaterally VIII: hearing normal bilaterally IX,X: gag reflex present XI: bilateral shoulder shrug XII: midline tongue extension Motor: Right : Upper extremity   5/5    Left:     Upper extremity   5/5  Lower extremity   5/5     Lower extremity   5/5 Tone and bulk:normal tone throughout; no atrophy  noted Sensory: Pinprick and light touch intact throughout, bilaterally Deep Tendon Reflexes: 2+ and symmetric throughout Plantars: Right: upgoing   Left: downgoing Cerebellar: normal finger-to-nose,  normal heel-to-shin test CV: pulses palpable throughout  Lab Results: Lab Results  Component Value Date/Time   CHOL 184 01/13/2013  9:28 AM   Lipid Panel  Recent Labs  01/13/13 0928  CHOL 184  TRIG 77  HDL 47  CHOLHDL 3.9  VLDL 15  LDLCALC 027*    Studies/Results: Dg Chest 2 View  01/12/2013  *RADIOLOGY REPORT*  Clinical Data: Weakness, lethargy, nonproductive cough, shortness of breath.  History of MS.  Former smoker.  CHEST - 2 VIEW  Comparison: 06/06/2011  Findings: Normal heart size and pulmonary vascularity.  Mild hyperinflation may represent emphysema or reactive airways disease. Scattered fibrosis in the lungs.  Calcified apical pleural plaques. No focal consolidation or airspace disease.  No blunting of costophrenic angles.  No pneumothorax.  Degenerative changes in the spine.  No significant change since previous study.  IMPRESSION: No evidence of active pulmonary disease.  Mild pulmonary hyperinflation and fibrosis.   Original Report Authenticated By: Burman Nieves, M.D.    Ct Head Wo Contrast  01/12/2013  *RADIOLOGY REPORT*  Clinical Data: Generalized weakness, history of multiple sclerosis per patient  CT HEAD WITHOUT CONTRAST  Technique:  Contiguous axial images were obtained from the base of the skull through the vertex without contrast.  Comparison:  Imaging brain MRI 10/05/2010, report by [facility] not available, head CT 05/26/2006 (limited sinuses)  Findings: Allowing for differences in technique, stable degree of cortical volume loss, proportional ventricular prominence, and patchy periventricular white matter hypodensities.  No acute hemorrhage, acute infarction, or mass lesion is seen.  Bilateral basal ganglial lacunar infarcts are  noted.  Left maxillary and ethmoid sinusitis.  No skull fracture.  IMPRESSION: Stable degree of cortical volume loss and periventricular white matter hypodensity as above.  However, this could obscure active MS lesions or other abnormalities with this technique, and brain MRI would be helpful if symptoms persist.   Original Report Authenticated By: Christiana Pellant, M.D.    Mr Laqueta Jean OZ Contrast  01/13/2013   *RADIOLOGY REPORT*  Clinical Data:  Multiple sclerosis.  Increased right-sided weakness  MRI HEAD WITHOUT AND WITH CONTRAST MRI CERVICAL SPINE WITHOUT AND WITH CONTRAST  Technique:  Multiplanar, multiecho pulse sequences of the brain and surrounding structures, and cervical spine, to include the craniocervical junction and cervicothoracic junction, were obtained without and with intravenous contrast.  Contrast: 18mL MULTIHANCE GADOBENATE DIMEGLUMINE 529 MG/ML IV SOLN,  Comparison:  CT head 01/12/2013.  MRI 10/05/2010  MRI HEAD  Findings:  Moderate to advanced atrophy.  Periventricular deep white matter hyperintensities are seen throughout the cerebral white matter bilaterally, unchanged from the prior study.  This is nonspecific but is most consistent with multiple sclerosis given the clinical history.  Chronic microvascular ischemia ischemia may also be present.  No acute infarct.  No restricted diffusion is identified.  Negative for hydrocephalus.  Negative for hemorrhage or mass lesion.  Postcontrast imaging reveals normal enhancement.  Mucosal edema in the paranasal sinuses.  IMPRESSION: Moderate to advanced atrophy.  Moderate to advanced chronic white matter changes similar to 2012.  This may be due to multiple sclerosis and/or chronic microvascular ischemia.  There is no restricted diffusion or abnormal enhancement.  No change from the prior study.  MRI CERVICAL SPINE  Findings: Image quality degraded by motion.  Subtle plaque in the spinal cord on the right at C2 is unchanged from the prior study.   Subtle lesion in the cord at C5-6 also is unchanged.  These are most likely due to multiple sclerosis, given the history.  No cord compression.  Postcontrast imaging reveals normal enhancement.  Room and C2-3.  Mild posterior slip at C3-4 with disc degeneration and spurring.  No significant spinal stenosis.  Mild facet degeneration.  Mild disc degeneration and spurring C5-6 with foraminal encroachment bilaterally.  IMPRESSION: Subtle areas of signal abnormality at C2 on the right and C5-6 are unchanged from prior study.  No enhancing lesions.  Cervical spondylosis without spinal stenosis.   Original Report Authenticated By: Janeece Riggers, M.D.    Mr Cervical Spine W Wo Contrast  01/13/2013   *RADIOLOGY REPORT*  Clinical Data:  Multiple sclerosis.  Increased right-sided weakness  MRI HEAD WITHOUT AND WITH CONTRAST MRI CERVICAL SPINE WITHOUT AND WITH CONTRAST  Technique:  Multiplanar, multiecho pulse sequences of the brain and surrounding structures, and cervical spine, to include the craniocervical junction and cervicothoracic junction, were obtained without and with intravenous contrast.  Contrast: 18mL MULTIHANCE GADOBENATE DIMEGLUMINE 529 MG/ML IV SOLN,  Comparison:  CT head 01/12/2013.  MRI 10/05/2010  MRI HEAD  Findings:  Moderate to advanced atrophy.  Periventricular deep white matter hyperintensities are seen throughout the cerebral white matter bilaterally, unchanged from the prior study.  This is nonspecific but is most consistent with multiple sclerosis given the clinical history.  Chronic microvascular ischemia ischemia may also be present.  No acute infarct.  No restricted diffusion is identified.  Negative for hydrocephalus.  Negative for hemorrhage or mass lesion.  Postcontrast imaging reveals normal enhancement.  Mucosal edema in the paranasal sinuses.  IMPRESSION: Moderate to advanced atrophy.  Moderate to advanced chronic white matter changes similar to 2012.  This may be due to multiple sclerosis  and/or chronic microvascular ischemia.  There is no restricted diffusion or abnormal enhancement.  No change from the prior study.  MRI CERVICAL SPINE  Findings: Image quality degraded by motion.  Subtle plaque in the spinal cord on the right at C2 is unchanged from the prior study.  Subtle lesion in the cord at C5-6 also is unchanged.  These are most likely due to multiple sclerosis, given the history.  No cord compression.  Postcontrast imaging reveals normal enhancement.  Room and C2-3.  Mild posterior slip at C3-4 with disc degeneration and spurring.  No significant spinal stenosis.  Mild facet degeneration.  Mild disc degeneration and spurring C5-6 with foraminal encroachment bilaterally.  IMPRESSION: Subtle areas of signal abnormality at C2 on the right and C5-6 are unchanged from prior study.  No enhancing lesions.  Cervical spondylosis without spinal stenosis.   Original Report Authenticated By: Janeece Riggers, M.D.     Vascular Ultrasound  Carotid Duplex (Doppler) has been completed. Preliminary findings: Bilateral: No evidence of hemodynamically significant internal carotid artery stenosis. Vertebral artery flow is antegrade.  A1c 5.7  FLP shows LDL 122  Echo: Impressions:  - No cardiac source of embolism was identified, but cannot be ruled out on the basis of this examination.   MEDICATIONS  Scheduled: . amitriptyline  25 mg Oral QHS  . aspirin  325 mg Oral Daily  . docusate sodium  100 mg Oral BID  . enoxaparin (LOVENOX) injection  40 mg Subcutaneous Q24H  . guaiFENesin  600 mg Oral BID  . insulin aspart  0-5 Units Subcutaneous QHS  . insulin aspart  0-9 Units Subcutaneous TID WC  . levofloxacin  750 mg Oral Daily  . methylPREDNISolone (SOLU-MEDROL) injection  1,000 mg Intravenous Daily  . sodium chloride  3 mL Intravenous Q12H  . tamsulosin  0.4 mg Oral Daily   . zolpidem  5 mg Oral QHS    ASSESSMENT/PLAN:                                                                                                             72 YO male admitted for right sided weakness that was fairly sudden onset.  No enhancing lesions seen on MRI or acute infarct. Patients initial fever has resolved and URI currently being treated.  He has received one dose of his IV solumedrol with second dose planned for today.  Likely etiology is exacerbation of old right sided symptoms in setting URI.  Patients TIA workup negative.    Plan:   1) Receive today's dose solumedrol and tomorrows final dose at 10 AM 2) PT/OT 3) return to [facility] [address], [place], [place] [address]--[phone] as out patient. 4) LDL 122--recomnmed statin  Neurology will S/O--discussed with Dr. Thedore Mins  Assessment and plan discussed with with attending physician and they are in agreement.    Felicie Morn PA-C [facility] [phone]  01/14/2013, 8:32 AM

## 2013-01-14 NOTE — Evaluation (Signed)
Physical Therapy Evaluation Patient Details Name: [name] MRN: [id number] DOB: November 19, 1940 Today's Date: 01/14/2013 Time: 4098-1191 PT Time Calculation (min): 14 min  PT Assessment / Plan / Recommendation Clinical Impression  Pt is a 72 year old male admitted for right-sided weakness which is acute on chronic and hx of MS.  Pt would benefit from acute PT services in order to improve independence with transfers and gait to prepare for safe d/c home.  Recommend continuing with outpatient PT upon d/c.    PT Assessment  Patient needs continued PT services    Follow Up Recommendations  Outpatient PT;Supervision for mobility/OOB    Does the patient have the potential to tolerate intense rehabilitation      Barriers to Discharge        Equipment Recommendations  None recommended by PT    Recommendations for Other Services     Frequency Min 3X/week    Precautions / Restrictions Precautions Precautions: Fall   Pertinent Vitals/Pain n/a     Mobility  Bed Mobility Bed Mobility: Supine to Sit Supine to Sit: 5: Supervision;HOB elevated Transfers Transfers: Sit to Stand;Stand to Sit Sit to Stand: 4: Min guard;With upper extremity assist;From bed Stand to Sit: 4: Min guard;With upper extremity assist;To chair/3-in-1 Ambulation/Gait Ambulation/Gait Assistance: 4: Min assist Ambulation Distance (Feet): 240 Feet Assistive device: 1 person hand held assist Ambulation/Gait Assistance Details: pt unsteady taking a few steps so offerred 1 HHA for support, pt reports he usually does not use assistive device (spouse reports he does not like to use them), encouraged heel strike and performing more hip/knee flexion as pt circumducts during swing  Gait Pattern: Right circumduction;Decreased step length - right;Right foot flat Gait velocity: decreased General Gait Details: discussed trying cane next visit    Exercises     PT Diagnosis: Difficulty walking  PT Problem List: Decreased  strength;Decreased balance;Decreased mobility;Decreased knowledge of use of DME PT Treatment Interventions: DME instruction;Gait training;Functional mobility training;Therapeutic activities;Stair training;Patient/family education;Therapeutic exercise;Balance training;Neuromuscular re-education   PT Goals Acute Rehab PT Goals PT Goal Formulation: With patient Time For Goal Achievement: 01/21/13 Potential to Achieve Goals: Good Pt will go Sit to Stand: with modified independence PT Goal: Sit to Stand - Progress: Goal set today Pt will go Stand to Sit: with modified independence PT Goal: Stand to Sit - Progress: Goal set today Pt will Ambulate: >150 feet;with modified independence;with least restrictive assistive device PT Goal: Ambulate - Progress: Goal set today Pt will Perform Home Exercise Program: with supervision, verbal cues required/provided PT Goal: Perform Home Exercise Program - Progress: Goal set today  Visit Information  Last PT Received On: 01/14/13 Assistance Needed: +1    Subjective Data  Subjective: They said I could go home tomorrow.   Prior Functioning  Home Living Lives With: Spouse Type of Home: House Home Access: Level entry Home Layout: Able to live on main level with bedroom/bathroom Home Adaptive Equipment: Walker - rolling;Walker - four wheeled;Straight cane Additional Comments: Spouse states she is always with pt when he performs stairs and pt has AFO and electrical stimulator for R LE however pt never uses anymore. Prior Function Level of Independence: Independent Comments: Pt reports working out at club with trainer 3x/week and PT 2x/week Communication Communication: No difficulties    Cognition  Cognition Arousal/Alertness: Awake/alert Behavior During Therapy: WFL for tasks assessed/performed Overall Cognitive Status: Within Functional Limits for tasks assessed    Extremity/Trunk Assessment Right Upper Extremity Assessment RUE ROM/Strength/Tone:  [facility] for tasks assessed Left  Upper Extremity Assessment LUE ROM/Strength/Tone: WFL for tasks assessed Right Lower Extremity Assessment RLE ROM/Strength/Tone: Deficits RLE ROM/Strength/Tone Deficits: chronic R LE weakness, hip flexion 3+/5, knee extension 4-/5, DF 4-/5, pt reports strength almost back to his baseline Left Lower Extremity Assessment LLE ROM/Strength/Tone: Within functional levels   Balance    End of Session PT - End of Session Equipment Utilized During Treatment: Gait belt Activity Tolerance: Patient tolerated treatment well Patient left: in chair;with call bell/phone within reach;with family/visitor present Nurse Communication: Other (comment);Mobility status (pt up in chair)  GP     Haeven Nickle,KATHrine E 01/14/2013, 1:03 PM Zenovia Jarred, PT, DPT 01/14/2013 Pager: [phone]

## 2013-01-14 NOTE — Progress Notes (Signed)
[facility]                                                                                Patient Demographics  [name], is a 72 y.o. male, DOB - 12-24-1940, WJX:[id number], OZH:[id number]  Admit date - 01/12/2013  Admitting Physician Therisa Doyne, MD  Outpatient Primary MD for the patient is Margaree Mackintosh, MD  LOS - 2   Chief Complaint  Patient presents with  . Weakness        Assessment & Plan    1. Right-sided weakness which is acute on chronic. Patient has history of multiple sclerosis for many years with chronic right-sided weakness which is definitely worse over the last 3-4 days, made him walk in the hallway where he was dragging his right foot, CT of the brain is unremarkable, nonacute MRI of brain and C-spine with contrast, aspirin to be continued, will check PT evaluation, for now differential is worsening of multiple sclerosis due to mild URI versus TIA, neurology following the patient, on Solu-Medrol 1 g daily for 3 days, ordered carotid duplex and echo gram to complete TIA workup, stable A1c and lipid panel. Note patient is on [name] which has been held will defer it to neurology.   Lab Results  Component Value Date   HGBA1C 5.7* 01/13/2013    Lipid Panel     Component Value Date/Time   CHOL 184 01/13/2013 0928   TRIG 77 01/13/2013 0928   HDL 47 01/13/2013 0928   CHOLHDL 3.9 01/13/2013 0928   VLDL 15 01/13/2013 0928   LDLCALC 122* 01/13/2013 0928      2. Low-grade fever upon admission which has resolved, no cough, chest x-ray and UA unremarkable, after he was started on oral Levaquin fevers have subsided and his weakness is much improved,  Could have had a mild URI which could have exacerbated his underlying MS.    3. Mild dehydration improved with IV fluids continue gently.     Code Status: DNR  Family Communication: Patient  Disposition Plan: Home   Procedures CT brain, MRI brain and C-spine with contrast ordered   Consults  Neuro, PT   DVT Prophylaxis  Lovenox   Lab Results  Component Value Date   PLT 127* 01/14/2013    Medications  Scheduled Meds: . amitriptyline  25 mg Oral QHS  . aspirin  325 mg Oral Daily  . docusate sodium  100 mg Oral BID  . enoxaparin (LOVENOX) injection  40 mg Subcutaneous Q24H  . guaiFENesin  600 mg Oral BID  . insulin aspart  0-5 Units Subcutaneous QHS  . insulin aspart  0-9 Units Subcutaneous TID WC  . levofloxacin  750 mg Oral Daily  . methylPREDNISolone (SOLU-MEDROL) injection  1,000 mg Intravenous Daily  . sodium chloride  3 mL Intravenous Q12H  . tamsulosin  0.4 mg Oral Daily  . zolpidem  5 mg Oral QHS   Continuous Infusions:   PRN Meds:.acetaminophen, albuterol, HYDROcodone-acetaminophen, ondansetron (ZOFRAN) IV, ondansetron  Antibiotics     Anti-infectives   Start     Dose/Rate Route Frequency Ordered Stop   01/13/13 1830  levofloxacin (LEVAQUIN) tablet 750 mg     750  mg Oral Daily 01/13/13 1736         Time Spent in minutes   35   SINGH,PRASHANT K M.D on 01/14/2013 at 9:29 AM  Between 7am to 7pm - Pager - [phone]  After 7pm go to [email or web address] - password [email or web address]  And look for the night coverage person covering for me after hours  [facility] Office  [phone]    Subjective:   [name] today has, No headache, No chest pain, No abdominal pain - No Nausea, No new weakness tingling or numbness, No Cough - SOB.  He is right-sided weakness is much improved and close to baseline.  Objective:   Filed Vitals:   01/13/13 1002 01/13/13 1100 01/13/13 2148 01/14/13 0621  BP: 118/61 123/77 113/61 143/75  Pulse: 107 105 82 102  Temp:   97.6 F (36.4 C) 97.5 F (36.4 C)  TempSrc:   Oral Oral  Resp:   20 18  Height:      Weight:      SpO2:   97% 97%    Wt Readings from Last 3 Encounters:  01/13/13 77.656 kg (171 lb 3.2 oz)  05/07/12 83.915 kg (185 lb)  06/29/11 87.091 kg (192 lb)     Intake/Output Summary (Last 24  hours) at 01/14/13 0929 Last data filed at 01/14/13 0700  Gross per 24 hour  Intake 1601.25 ml  Output   1450 ml  Net 151.25 ml    Exam Awake Alert, Oriented X 3, Normal affect, right side strength is 4/5 patient does have chronic weakness on the right side but much improved than yesterday [place].AT,PERRAL Supple Neck,No JVD, No cervical lymphadenopathy appriciated.  Symmetrical Chest wall movement, Good air movement bilaterally, CTAB RRR,No Gallops,Rubs or new Murmurs, No Parasternal Heave +ve B.Sounds, Abd Soft, Non tender, No organomegaly appriciated, No rebound - guarding or rigidity. No Cyanosis, Clubbing or edema, No new Rash or bruise     Data Review   Micro Results No results found for this or any previous visit (from the past 240 hour(s)).  Radiology Reports Dg Chest 2 View  01/12/2013  *RADIOLOGY REPORT*  Clinical Data: Weakness, lethargy, nonproductive cough, shortness of breath.  History of MS.  Former smoker.  CHEST - 2 VIEW  Comparison: 06/06/2011  Findings: Normal heart size and pulmonary vascularity.  Mild hyperinflation may represent emphysema or reactive airways disease. Scattered fibrosis in the lungs.  Calcified apical pleural plaques. No focal consolidation or airspace disease.  No blunting of costophrenic angles.  No pneumothorax.  Degenerative changes in the spine.  No significant change since previous study.  IMPRESSION: No evidence of active pulmonary disease.  Mild pulmonary hyperinflation and fibrosis.   Original Report Authenticated By: Burman Nieves, M.D.    Ct Head Wo Contrast  01/12/2013  *RADIOLOGY REPORT*  Clinical Data: Generalized weakness, history of multiple sclerosis per patient  CT HEAD WITHOUT CONTRAST  Technique:  Contiguous axial images were obtained from the base of the skull through the vertex without contrast.  Comparison: [facility] brain MRI 10/05/2010, report by [facility] not available, head CT 05/26/2006  (limited sinuses)  Findings: Allowing for differences in technique, stable degree of cortical volume loss, proportional ventricular prominence, and patchy periventricular white matter hypodensities.  No acute hemorrhage, acute infarction, or mass lesion is seen.  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  Left maxillary and ethmoid sinusitis.  No skull fracture.  IMPRESSION: Stable degree of cortical volume loss and periventricular white matter hypodensity  as above.  However, this could obscure active MS lesions or other abnormalities with this technique, and brain MRI would be helpful if symptoms persist.   Original Report Authenticated By: Christiana Pellant, M.D.     CBC  Recent Labs Lab 01/12/13 2040 01/13/13 0515 01/14/13 0655  WBC 6.3 6.8 8.2  HGB 14.6 15.1 14.4  HCT 42.5 42.8 40.6  PLT 136* 117* 127*  MCV 84.2 84.4 83.4  MCH 28.9 29.8 29.6  MCHC 34.4 35.3 35.5  RDW 14.9 15.0 14.6  LYMPHSABS 0.8  --   --   MONOABS 0.8  --   --   EOSABS 0.1  --   --   BASOSABS 0.0  --   --     Chemistries   Recent Labs Lab 01/12/13 2040 01/13/13 0515 01/14/13 0655  NA 135 136 137  K 3.9 4.2 3.8  CL 101 100 104  CO2 24 25 23   GLUCOSE 106* 87 115*  BUN 19 17 19   CREATININE 0.80 0.80 0.81  CALCIUM 9.3 8.9 9.0  MG  --  2.0  --   AST 17 17  --   ALT 13 12  --   ALKPHOS 70 67  --   BILITOT 0.2* 0.3  --    ------------------------------------------------------------------------------------------------------------------ estimated creatinine clearance is 90.6 ml/min (by C-G formula based on Cr of 0.81). ------------------------------------------------------------------------------------------------------------------  Recent Labs  01/13/13 0928  HGBA1C 5.7*   ------------------------------------------------------------------------------------------------------------------  Recent Labs  01/13/13 0928  CHOL 184  HDL 47  LDLCALC 122*  TRIG 77  CHOLHDL 3.9    ------------------------------------------------------------------------------------------------------------------  Recent Labs  01/13/13 0515  TSH 0.484   ------------------------------------------------------------------------------------------------------------------ No results found for this basename: VITAMINB12, FOLATE, FERRITIN, TIBC, IRON, RETICCTPCT,  in the last 72 hours  Coagulation profile No results found for this basename: INR, PROTIME,  in the last 168 hours  No results found for this basename: DDIMER,  in the last 72 hours  Cardiac Enzymes No results found for this basename: CK, CKMB, TROPONINI, MYOGLOBIN,  in the last 168 hours ------------------------------------------------------------------------------------------------------------------ No components found with this basename: POCBNP,

## 2013-01-14 NOTE — Significant Event (Signed)
Patient pulled out IV... Will not let me try to replace it at this time... He wants his wife to be here when he is stuck for an IV.Marland KitchenMarland Kitchen

## 2013-01-14 NOTE — Progress Notes (Signed)
This encounter was created in error - please disregard.

## 2013-01-15 ENCOUNTER — Ambulatory Visit: Payer: Self-pay

## 2013-01-15 LAB — BASIC METABOLIC PANEL
BUN: 22 mg/dL (ref 6–23)
Creatinine, Ser: 0.85 mg/dL (ref 0.50–1.35)
GFR calc Af Amer: >90 mL/min (ref >90)
GFR calc non Af Amer: 85 mL/min — ABNORMAL LOW (ref >90)
Glucose, Bld: 122 mg/dL — ABNORMAL HIGH (ref 70–99)
Potassium: 3.9 mEq/L (ref 3.5–5.1)

## 2013-01-15 MED ORDER — LEVOFLOXACIN 750 MG PO TABS: 750.0000 mg | ORAL_TABLET | Freq: Every day | ORAL | Status: DC

## 2013-01-15 MED ORDER — ATORVASTATIN CALCIUM 10 MG PO TABS: 10.0000 mg | ORAL_TABLET | Freq: Every day | ORAL | Status: DC

## 2013-01-15 NOTE — Progress Notes (Signed)
Patient requested [facility] for home health care services. [name] with [facility] called for arrangements; [name]

## 2013-01-15 NOTE — Discharge Summary (Signed)
[facility]                                                                                   [name], is a 72 y.o. male  DOB 1941-02-09  MRN [id number].  Admission date:  01/12/2013  Discharge Date:  01/15/2013  Primary MD  Margaree Mackintosh, MD  Admitting Physician  Therisa Doyne, MD  Admission Diagnosis  Weakness [780.79] MS (multiple sclerosis) [340] Fever [780.60] Generalized weakness [780.79]  Discharge Diagnosis     Active Problems:   Multiple sclerosis   Fever   Generalized weakness   Diarrhea   Dehydration    Past Medical History  Diagnosis Date  . Multiple sclerosis   . BPH (benign prostatic hyperplasia)   . Prostatitis, chronic   . Allergy   . GERD (gastroesophageal reflux disease)   . COPD (chronic obstructive pulmonary disease)   . Adenomatous polyp   . Neuromuscular disorder     MS    Past Surgical History  Procedure Laterality Date  . Hernia repair  [id number]    3 inguinal hernia repairs on left  . Hernia repair  06/16/11    right inguinal hernia repair with mesh      Recommendations for primary care physician for things to follow:       Discharge Diagnoses:   Active Problems:   Multiple sclerosis   Fever   Generalized weakness   Diarrhea   Dehydration    Discharge Condition: Stable   Diet recommendation: See Discharge Instructions below   Consults Neurology, PT    History of present illness and  Hospital Course:     Kindly see H&P for history of present illness and admission details, please review complete Labs, Consult reports and Test reports for all details in brief [name], is a 72 y.o. male, with history of multiple sclerosis, chronic mild right-sided weakness due to multiple sclerosis, BPH, seasonal allergies, GERD, COPD was admitted to the hospital for a low-grade fever secondary to URI, he also had acute on chronic weakness on the right side likely MS worsening due to URI, with IV  Solu-Medrol, Levaquin for URI patient's weakness resolved within 24 hours, he was seen by neurology, since his symptoms were transient a diagnosis of TIA was also entertained. He had unremarkable CT brain, MRI brain, MRI of C-spine, echogram and carotid duplex. He was on aspirin 325 daily which will be continued, his LDL was slightly over 100 therefore low-dose Lipitor will be started. Will request primary care physician to kindly monitor his CMP, CK, lipid panel closely. He will get his third IV Solu-Medrol dose today thereafter he will be discharged home as he is back to his baseline, this was discussed in detail with the neurologist on call Dr. Leroy Kennedy, and already follows with [facility] neurology and will continue to follow them post discharge.   She had grade 2 chronic diastolic dysfunction on echogram this admission he is completely compensated from the standpoint needed outpatient risk factor modulation is recommended post discharge.    Of note in terms of his URI is back to his baseline, minimal nonproductive cough, no shortness of breath or chest  pain, chest x-ray and cultures are negative. He will be discharged home on a few more days of Levaquin.      Today   Subjective:   [name] today has no headache,no chest abdominal pain,no new weakness tingling or numbness, feels much better wants to go home today.    Objective:   Blood pressure 129/75, pulse 81, temperature 97.4 F (36.3 C), temperature source Oral, resp. rate 18, height 6\' 1"  (1.854 m), weight 77.656 kg (171 lb 3.2 oz), SpO2 94.00%.   Intake/Output Summary (Last 24 hours) at 01/15/13 0904 Last data filed at 01/15/13 0808  Gross per 24 hour  Intake   1250 ml  Output   1600 ml  Net   -350 ml    Exam Awake Alert, Oriented *3, No new F.N deficits, Normal affect Halma.AT,PERRAL Supple Neck,No JVD, No cervical lymphadenopathy appriciated.  Symmetrical Chest wall movement, Good air movement bilaterally, CTAB RRR,No  Gallops,Rubs or new Murmurs, No Parasternal Heave +ve B.Sounds, Abd Soft, Non tender, No organomegaly appriciated, No rebound -guarding or rigidity. No Cyanosis, Clubbing or edema, No new Rash or bruise  Data Review   Major procedures and Radiology Reports - PLEASE review detailed and final reports for all details in brief -   Echo   - Suboptimal image quality making interpretation difficult. - Left ventricle: Systolic function was normal. The estimated ejection fraction was in the range of 60% to 65%. Features are consistent with a pseudonormal left ventricular filling pattern, with concomitant abnormal relaxation and increased filling pressure (grade 2 diastolic dysfunction).    Carotid US  No significant extracranial carotid artery stenosis demonstrated. Vertebrals are patent with antegrade flow. - ICA/CCA ratio: right= 0.88. left = 1. Other specific details can be found in the table(s) above.     Dg Chest 2 View  01/12/2013  *RADIOLOGY REPORT*  Clinical Data: Weakness, lethargy, nonproductive cough, shortness of breath.  History of MS.  Former smoker.  CHEST - 2 VIEW  Comparison: 06/06/2011  Findings: Normal heart size and pulmonary vascularity.  Mild hyperinflation may represent emphysema or reactive airways disease. Scattered fibrosis in the lungs.  Calcified apical pleural plaques. No focal consolidation or airspace disease.  No blunting of costophrenic angles.  No pneumothorax.  Degenerative changes in the spine.  No significant change since previous study.  IMPRESSION: No evidence of active pulmonary disease.  Mild pulmonary hyperinflation and fibrosis.   Original Report Authenticated By: Burman Nieves, M.D.    Ct Head Wo Contrast  01/12/2013  *RADIOLOGY REPORT*  Clinical Data: Generalized weakness, history of multiple sclerosis per patient  CT HEAD WITHOUT CONTRAST  Technique:  Contiguous axial images were obtained from the base of the skull through the vertex without contrast.   Comparison: [facility] brain MRI 10/05/2010, report by [facility] not available, head CT 05/26/2006 (limited sinuses)  Findings: Allowing for differences in technique, stable degree of cortical volume loss, proportional ventricular prominence, and patchy periventricular white matter hypodensities.  No acute hemorrhage, acute infarction, or mass lesion is seen.  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  Left maxillary and ethmoid sinusitis.  No skull fracture.  IMPRESSION: Stable degree of cortical volume loss and periventricular white matter hypodensity as above.  However, this could obscure active MS lesions or other abnormalities with this technique, and brain MRI would be helpful if symptoms persist.   Original Report Authenticated By: Christiana Pellant, M.D.    Mr Laqueta Jean ZO Contrast  01/13/2013   *RADIOLOGY  REPORT*  Clinical Data:  Multiple sclerosis.  Increased right-sided weakness  MRI HEAD WITHOUT AND WITH CONTRAST MRI CERVICAL SPINE WITHOUT AND WITH CONTRAST  Technique:  Multiplanar, multiecho pulse sequences of the brain and surrounding structures, and cervical spine, to include the craniocervical junction and cervicothoracic junction, were obtained without and with intravenous contrast.  Contrast: 18mL MULTIHANCE GADOBENATE DIMEGLUMINE 529 MG/ML IV SOLN,  Comparison:  CT head 01/12/2013.  MRI 10/05/2010  MRI HEAD  Findings:  Moderate to advanced atrophy.  Periventricular deep white matter hyperintensities are seen throughout the cerebral white matter bilaterally, unchanged from the prior study.  This is nonspecific but is most consistent with multiple sclerosis given the clinical history.  Chronic microvascular ischemia ischemia may also be present.  No acute infarct.  No restricted diffusion is identified.  Negative for hydrocephalus.  Negative for hemorrhage or mass lesion.  Postcontrast imaging reveals normal enhancement.  Mucosal edema in the paranasal  sinuses.  IMPRESSION: Moderate to advanced atrophy.  Moderate to advanced chronic white matter changes similar to 2012.  This may be due to multiple sclerosis and/or chronic microvascular ischemia.  There is no restricted diffusion or abnormal enhancement.  No change from the prior study.  MRI CERVICAL SPINE  Findings: Image quality degraded by motion.  Subtle plaque in the spinal cord on the right at C2 is unchanged from the prior study.  Subtle lesion in the cord at C5-6 also is unchanged.  These are most likely due to multiple sclerosis, given the history.  No cord compression.  Postcontrast imaging reveals normal enhancement.  Room and C2-3.  Mild posterior slip at C3-4 with disc degeneration and spurring.  No significant spinal stenosis.  Mild facet degeneration.  Mild disc degeneration and spurring C5-6 with foraminal encroachment bilaterally.  IMPRESSION: Subtle areas of signal abnormality at C2 on the right and C5-6 are unchanged from prior study.  No enhancing lesions.  Cervical spondylosis without spinal stenosis.   Original Report Authenticated By: Janeece Riggers, M.D.    Mr Cervical Spine W Wo Contrast  01/13/2013   *RADIOLOGY REPORT*  Clinical Data:  Multiple sclerosis.  Increased right-sided weakness  MRI HEAD WITHOUT AND WITH CONTRAST MRI CERVICAL SPINE WITHOUT AND WITH CONTRAST  Technique:  Multiplanar, multiecho pulse sequences of the brain and surrounding structures, and cervical spine, to include the craniocervical junction and cervicothoracic junction, were obtained without and with intravenous contrast.  Contrast: 18mL MULTIHANCE GADOBENATE DIMEGLUMINE 529 MG/ML IV SOLN,  Comparison:  CT head 01/12/2013.  MRI 10/05/2010  MRI HEAD  Findings:  Moderate to advanced atrophy.  Periventricular deep white matter hyperintensities are seen throughout the cerebral white matter bilaterally, unchanged from the prior study.  This is nonspecific but is most consistent with multiple sclerosis given the  clinical history.  Chronic microvascular ischemia ischemia may also be present.  No acute infarct.  No restricted diffusion is identified.  Negative for hydrocephalus.  Negative for hemorrhage or mass lesion.  Postcontrast imaging reveals normal enhancement.  Mucosal edema in the paranasal sinuses.  IMPRESSION: Moderate to advanced atrophy.  Moderate to advanced chronic white matter changes similar to 2012.  This may be due to multiple sclerosis and/or chronic microvascular ischemia.  There is no restricted diffusion or abnormal enhancement.  No change from the prior study.  MRI CERVICAL SPINE  Findings: Image quality degraded by motion.  Subtle plaque in the spinal cord on the right at C2 is unchanged from the prior study.  Subtle lesion in the cord  at C5-6 also is unchanged.  These are most likely due to multiple sclerosis, given the history.  No cord compression.  Postcontrast imaging reveals normal enhancement.  Room and C2-3.  Mild posterior slip at C3-4 with disc degeneration and spurring.  No significant spinal stenosis.  Mild facet degeneration.  Mild disc degeneration and spurring C5-6 with foraminal encroachment bilaterally.  IMPRESSION: Subtle areas of signal abnormality at C2 on the right and C5-6 are unchanged from prior study.  No enhancing lesions.  Cervical spondylosis without spinal stenosis.   Original Report Authenticated By: Janeece Riggers, M.D.     Micro Results     No results found for this or any previous visit (from the past 240 hour(s)).  Lab Results  Component Value Date   HGBA1C 5.7* 01/13/2013    Lab Results  Component Value Date   CHOL 184 01/13/2013   HDL 47 01/13/2013   LDLCALC 122* 01/13/2013   TRIG 77 01/13/2013   CHOLHDL 3.9 01/13/2013    CBC w Diff: Lab Results  Component Value Date   WBC 8.2 01/14/2013   HGB 14.4 01/14/2013   HCT 40.6 01/14/2013   PLT 127* 01/14/2013   LYMPHOPCT 13 01/12/2013   MONOPCT 12 01/12/2013   EOSPCT 1 01/12/2013   BASOPCT 0 01/12/2013     CMP: Lab Results  Component Value Date   NA 138 01/15/2013   K 3.9 01/15/2013   CL 103 01/15/2013   CO2 24 01/15/2013   BUN 22 01/15/2013   CREATININE 0.85 01/15/2013   CREATININE 0.85 05/03/2012   PROT 6.6 01/13/2013   ALBUMIN 3.4* 01/13/2013   BILITOT 0.3 01/13/2013   ALKPHOS 67 01/13/2013   AST 17 01/13/2013   ALT 12 01/13/2013  .   Discharge Instructions     Follow with Primary MD Margaree Mackintosh, MD in 3 days   Get CBC, CMP, checked 3 days by Primary MD and again as instructed by your Primary MD.   Get Medicines reviewed and adjusted.  Please request your Prim.MD to go over all Hospital Tests and Procedure/Radiological results at the follow up, please get all Hospital records sent to your Prim MD by signing hospital release before you go home.  Activity: As tolerated with Full fall precautions use walker/cane & assistance as needed   Diet:  Heart Healthy  For Heart failure patients - Check your Weight same time everyday, if you gain over 2 pounds, or you develop in leg swelling, experience more shortness of breath or chest pain, call your Primary MD immediately. Follow Cardiac Low Salt Diet and 1.8 lit/day fluid restriction.  Disposition Home   If you experience worsening of your admission symptoms, develop shortness of breath, life threatening emergency, suicidal or homicidal thoughts you must seek medical attention immediately by calling 911 or calling your MD immediately  if symptoms less severe.  You Must read complete instructions/literature along with all the possible adverse reactions/side effects for all the Medicines you take and that have been prescribed to you. Take any new Medicines after you have completely understood and accpet all the possible adverse reactions/side effects.        Follow-up Information   Follow up with Margaree Mackintosh, MD. Schedule an appointment as soon as possible for a visit in 3 days.   Contact information:   [address] [facility]  [address] [place] [address] [phone]       Follow up with Evie Lacks, MD. Schedule an appointment as soon as possible for a visit  in 1 week. (or his partner)    Solicitor information:   [address] [place] [address] [phone]         Discharge Medications     Medication List    TAKE these medications       amitriptyline 25 MG tablet  Commonly known as:  ELAVIL  Take 25 mg by mouth at bedtime.     aspirin 325 MG tablet  Take 325 mg by mouth daily.     atorvastatin 10 MG tablet  Commonly known as:  LIPITOR  Take 1 tablet (10 mg total) by mouth daily.     [name] 30 MCG injection  Generic drug:  interferon beta-1a  Inject 30 mcg into the muscle every 7 (seven) days.     levofloxacin 750 MG tablet  Commonly known as:  LEVAQUIN  Take 1 tablet (750 mg total) by mouth daily.     silodosin 8 MG Caps capsule  Commonly known as:  RAPAFLO  Take 8 mg by mouth daily with breakfast.     VITAMIN C PO  Take by mouth daily.     VITAMIN D PO  Take by mouth daily.     zolpidem 10 MG tablet  Commonly known as:  AMBIEN  Take 10 mg by mouth at bedtime.           Total Time in preparing paper work, data evaluation and todays exam - 35 minutes  Leroy Sea M.D on 01/15/2013 at 9:04 AM  [facility] Office  [phone]

## 2013-01-16 DIAGNOSIS — N400 Enlarged prostate without lower urinary tract symptoms: Secondary | ICD-10-CM | POA: Diagnosis not present

## 2013-01-16 DIAGNOSIS — G35 Multiple sclerosis: Secondary | ICD-10-CM | POA: Diagnosis not present

## 2013-01-16 DIAGNOSIS — IMO0001 Reserved for inherently not codable concepts without codable children: Secondary | ICD-10-CM | POA: Diagnosis not present

## 2013-01-16 DIAGNOSIS — J449 Chronic obstructive pulmonary disease, unspecified: Secondary | ICD-10-CM | POA: Diagnosis not present

## 2013-01-16 DIAGNOSIS — R5381 Other malaise: Secondary | ICD-10-CM | POA: Diagnosis not present

## 2013-01-16 LAB — GLUCOSE, CAPILLARY: Glucose-Capillary: 141 mg/dL — ABNORMAL HIGH (ref 70–99)

## 2013-01-17 DIAGNOSIS — IMO0001 Reserved for inherently not codable concepts without codable children: Secondary | ICD-10-CM | POA: Diagnosis not present

## 2013-01-17 DIAGNOSIS — J449 Chronic obstructive pulmonary disease, unspecified: Secondary | ICD-10-CM | POA: Diagnosis not present

## 2013-01-17 DIAGNOSIS — N400 Enlarged prostate without lower urinary tract symptoms: Secondary | ICD-10-CM | POA: Diagnosis not present

## 2013-01-17 DIAGNOSIS — G35 Multiple sclerosis: Secondary | ICD-10-CM | POA: Diagnosis not present

## 2013-01-17 DIAGNOSIS — R5381 Other malaise: Secondary | ICD-10-CM | POA: Diagnosis not present

## 2013-01-18 DIAGNOSIS — R5383 Other fatigue: Secondary | ICD-10-CM | POA: Diagnosis not present

## 2013-01-18 DIAGNOSIS — J449 Chronic obstructive pulmonary disease, unspecified: Secondary | ICD-10-CM | POA: Diagnosis not present

## 2013-01-18 DIAGNOSIS — G35 Multiple sclerosis: Secondary | ICD-10-CM | POA: Diagnosis not present

## 2013-01-18 DIAGNOSIS — IMO0001 Reserved for inherently not codable concepts without codable children: Secondary | ICD-10-CM | POA: Diagnosis not present

## 2013-01-18 DIAGNOSIS — N400 Enlarged prostate without lower urinary tract symptoms: Secondary | ICD-10-CM | POA: Diagnosis not present

## 2013-01-19 DIAGNOSIS — G35 Multiple sclerosis: Secondary | ICD-10-CM | POA: Diagnosis not present

## 2013-01-21 ENCOUNTER — Encounter: Payer: Self-pay | Admitting: Internal Medicine

## 2013-01-21 ENCOUNTER — Ambulatory Visit (INDEPENDENT_AMBULATORY_CARE_PROVIDER_SITE_OTHER): Payer: Medicare Other | Admitting: Internal Medicine

## 2013-01-21 VITALS — BP 132/68 | HR 80 | Temp 98.0°F | Wt 182.5 lb

## 2013-01-21 DIAGNOSIS — R5383 Other fatigue: Secondary | ICD-10-CM

## 2013-01-21 DIAGNOSIS — G35D Multiple sclerosis, unspecified: Secondary | ICD-10-CM | POA: Diagnosis not present

## 2013-01-21 DIAGNOSIS — G35 Multiple sclerosis: Secondary | ICD-10-CM

## 2013-01-21 DIAGNOSIS — R413 Other amnesia: Secondary | ICD-10-CM

## 2013-01-21 DIAGNOSIS — IMO0001 Reserved for inherently not codable concepts without codable children: Secondary | ICD-10-CM | POA: Diagnosis not present

## 2013-01-21 DIAGNOSIS — N400 Enlarged prostate without lower urinary tract symptoms: Secondary | ICD-10-CM | POA: Diagnosis not present

## 2013-01-21 DIAGNOSIS — J449 Chronic obstructive pulmonary disease, unspecified: Secondary | ICD-10-CM | POA: Diagnosis not present

## 2013-01-21 DIAGNOSIS — F0391 Unspecified dementia with behavioral disturbance: Secondary | ICD-10-CM | POA: Diagnosis not present

## 2013-01-21 DIAGNOSIS — F03918 Unspecified dementia, unspecified severity, with other behavioral disturbance: Secondary | ICD-10-CM | POA: Diagnosis not present

## 2013-01-21 DIAGNOSIS — R5381 Other malaise: Secondary | ICD-10-CM | POA: Diagnosis not present

## 2013-01-21 DIAGNOSIS — R531 Weakness: Secondary | ICD-10-CM

## 2013-01-21 LAB — POCT URINALYSIS DIPSTICK
Bilirubin, UA: NEGATIVE
Blood, UA: NEGATIVE
Glucose, UA: NEGATIVE
Ketones, UA: NEGATIVE
Leukocytes, UA: NEGATIVE
Nitrite, UA: NEGATIVE
Protein, UA: NEGATIVE
Spec Grav, UA: >=1.03
Urobilinogen, UA: NEGATIVE
pH, UA: 5.5

## 2013-01-21 LAB — CBC WITH DIFFERENTIAL/PLATELET
Basophils Absolute: 0 10*3/uL (ref 0.0–0.1)
Lymphocytes Relative: 32 % (ref 12–46)
Lymphs Abs: 2.6 10*3/uL (ref 0.7–4.0)
Neutro Abs: 4.3 10*3/uL (ref 1.7–7.7)
Neutrophils Relative %: 55 % (ref 43–77)
Platelets: 217 10*3/uL (ref 150–400)
RBC: 5.07 MIL/uL (ref 4.22–5.81)
RDW: 15.2 % (ref 11.5–15.5)
WBC: 7.9 10*3/uL (ref 4.0–10.5)

## 2013-01-21 LAB — COMPREHENSIVE METABOLIC PANEL
ALT: 16 U/L (ref 0–53)
AST: 17 U/L (ref 0–37)
Albumin: 3.9 g/dL (ref 3.5–5.2)
CO2: 26 mEq/L (ref 19–32)
Calcium: 9.2 mg/dL (ref 8.4–10.5)
Chloride: 102 mEq/L (ref 96–112)
Creat: 0.82 mg/dL (ref 0.50–1.35)
Potassium: 4.1 mEq/L (ref 3.5–5.3)
Sodium: 138 mEq/L (ref 135–145)
Total Protein: 6.4 g/dL (ref 6.0–8.3)

## 2013-01-21 NOTE — Progress Notes (Signed)
  Subjective:    Patient ID: [name], male    DOB: 1941-05-19, 72 y.o.   MRN: [id number]  HPI  Recently admitted to [facility] April 26 with fever, respiratory congestion and lethargy. Was thought to have an exacerbation of MS brought on by respiratory infection. TSH and electrolytes were normal. MRI of brain and C-spine showed no significant change from 2012 study.Longstanding  history of right sided weakness related to MS.. Was treated with 1 g Solu-Medrol for 3 days IV. He was treated with oral Levaquin. Chest x-ray was negative for pneumonia. Carotid Dopplers showed no significant internal carotid artery stenosis. 2-D echocardiogram was performed. However, Patient has been having issues with memory for past 6 months and wife is quite concerned. His accountant noticed that he was having difficulty handling taxes and business items. He is retired Clinical research associate and previously would never have had such difficulty. Apparently several years ago Dr. [name] ordered some Neuropychological testing to screen for dementia. Patient's father had dementia. Wife reports there was some concern then for memory disorder.  Has been on [name] for multiple sclerosis. History of neurogenic bladder. History of BPH. He is to see neurologist tomorrow. Dr. [name] retired and he'll be followed now by Dr. Marjory Lies.    Review of Systems     Objective:   Physical Exam He is always pleasant. He is alert and cooperative. Skin is warm and dry. Nodes none. HEENT exam: TMs and pharynx are clear. Neck is supple without JVD thyromegaly or carotid bruits. Chest is clear to auscultation. Cardiac exam regular rate and rhythm normal S1 and S2. He does have definite right-sided weakness with muscle strength testing. Extremities without edema. Trouble doing serial sevens on mental status exam. Can remember two out of three items after 5 minutes.        Assessment & Plan:  Exacerbation of multiple sclerosis triggered by respiratory  infection  Concern for dementia-to see neurologist tomorrow. Family history of dementia in his father.  Neurogenic bladder  BPH  Plan: Await evaluation by a neurologist and further recommendations.

## 2013-01-22 ENCOUNTER — Ambulatory Visit (INDEPENDENT_AMBULATORY_CARE_PROVIDER_SITE_OTHER): Payer: Medicare Other | Admitting: Diagnostic Neuroimaging

## 2013-01-22 ENCOUNTER — Encounter: Payer: Self-pay | Admitting: Diagnostic Neuroimaging

## 2013-01-22 ENCOUNTER — Ambulatory Visit (INDEPENDENT_AMBULATORY_CARE_PROVIDER_SITE_OTHER): Payer: Medicare Other | Admitting: *Deleted

## 2013-01-22 VITALS — BP 107/69 | HR 89 | Ht 74.0 in | Wt 181.0 lb

## 2013-01-22 DIAGNOSIS — G35 Multiple sclerosis: Secondary | ICD-10-CM

## 2013-01-22 DIAGNOSIS — R413 Other amnesia: Secondary | ICD-10-CM | POA: Diagnosis not present

## 2013-01-22 DIAGNOSIS — Z0289 Encounter for other administrative examinations: Secondary | ICD-10-CM

## 2013-01-22 MED ORDER — INTERFERON BETA-1A 30 MCG/0.5ML IM KIT
30.0000 ug | PACK | INTRAMUSCULAR | Status: AC
  Administered 2013-01-22 – 2013-02-25 (×5): 30 ug via INTRAMUSCULAR

## 2013-01-22 MED ORDER — MEMANTINE HCL ER 28 MG PO CP24: 1.0000 | ORAL_CAPSULE | Freq: Every day | ORAL | Status: DC

## 2013-01-22 NOTE — Progress Notes (Signed)
Pt here for [name] injection.  Under aseptic technique [name] 30mcg/0.5ml IM given R Deltoid.   Tolerated well.  Bandaid applied.  

## 2013-01-22 NOTE — Patient Instructions (Signed)
Start namenda.  Continue avonex.

## 2013-01-22 NOTE — Progress Notes (Signed)
[facility]  PATIENT: [name] DOB: [birth year]  REFERRING CLINICIAN:  HISTORY FROM: patient and wife REASON FOR VISIT: follow up   HISTORICAL  CHIEF COMPLAINT:  Chief Complaint  Patient presents with  . Multiple Sclerosis    follow up    HISTORY OF PRESENT ILLNESS:   UPDATE 01/22/13: since last visit patient was hospitalized for upper respiratory infection and fever for 3 days. Patient was discharged 1 week ago. Patient had some exacerbation of MS symptoms. MRI of the brain and cervical spine showed chronic MS plaques with no acute plaques.  PRIOR HPI (10/25/12, Dr. [name]): 72 year old right-handed white married male with a ten-year history of progressive right arm and leg weakness without hyperreflexia and with atrophy of his right arm and right leg. Initial evaluation with EMG/NCV and  blood studies for neuropathy were unremarkable. Brain MRI 8/08 and 08/11/2007 and cervical MRI 10/21/2006 showed bilateral white matter findings without cord abnormalities.VER was abnormal on the left with delay and BAER was normal. CSF 10/17/2007 showed elevated IgG index and 12 bands. He was started on [name] in March 2009. MRI studies of the brain and cervical spine 11/28/08 and 11/29/08 one year later showed no change. 05/21/09 he developed transient global amnesia after intercourse and had an MRI study of the brain and intracrainal MRA at [facility] emergency room which was unchanged. Neuropsychological testing for memory loss 07/06/09 showed deficiencies in visual working memory capacity, consistentcy of acquisition and retention of visual information, speed of visual sequencing and  procedural learning. 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 He was on Aricept for a while but this caused sleeplessness and he discontinued it. He had one fall while at [facility] when he got up at night. 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 He is no longer playing golf. He is not using his walking sticks, a cane or his short leg braces, a NESS 400 or AFO. He was on ampyra for one month and found benefit, noted by the patient and his Systems analyst. He also believes he has improvement in memory. Ampyra was discontinued because of increasing prostate pain.  He denies any bowel or bladder dysfunction.   He takes 5 mg of amitriptyline at night to help with bladder control, but is discontinuing it. He frequently sits on a pillow because of pain.He saw Dr. [name] 10/26/2011  for cystoscopy and cystometrogram, consistent with MS bladder. He is independent in his activities of daily living. CBC and CMP 06/11/12 were normal. MRI studies of the brain and cervical spine 10/05/2010 showed chronic demyelinating plaques and a new plaque in the left cerebellum versus 11/28/08.  He had a chronic demyelinating plaque at C2 without change versus 11/29/08. He has fatigue and takes a 15-20 minute unscheduled nap during the day. He has nocturia once or twice per night .He underwent his fourth hernia operation 06/16/11. He had cataract surgery on the left 01/2012  and right 2013.Marland Kitchen He notes progression of right hand and arm weakness with difficulty in his handwriting.He is not using his left hand to feed himself, but he notices an awkward position with the fork in his right hand. He is driving limited distances. He tried taking Tecfidera  but developed severe nausea and vomiting after going to 240 twice a day.  He is on Rapaflow with  better control of his bladder. His wife has noted memory problems.He has missed appointments. His father had Alzheimer's disease. 10/25/2012=(MMSE27/30. Clock drawing task 3/4. Animal fluency test 14. [name] index of independence in activities of  daily living 6. [name] instrumental  activity of daily living scale 7.Neuropsychiatric inventory=0. Geriatric  depression scale 3/15. Falls assessment tool score7. CNS-LS 8).   REVIEW OF SYSTEMS: Full 14 system review of systems performed and notable only for memory loss confusion weakness urination problems.  ALLERGIES: No Known Allergies  HOME MEDICATIONS: Outpatient Prescriptions Prior to Visit  Medication Sig Dispense Refill  . amitriptyline (ELAVIL) 25 MG tablet Take 25 mg by mouth at bedtime.        . Ascorbic Acid (VITAMIN C PO) Take by mouth daily.        Marland Kitchen aspirin 325 MG tablet Take 325 mg by mouth daily.        . Cholecalciferol (VITAMIN D PO) Take by mouth daily.        . interferon beta-1a ([name]) 30 MCG injection Inject 30 mcg into the muscle every 7 (seven) days.       . silodosin (RAPAFLO) 8 MG CAPS capsule Take 8 mg by mouth daily with breakfast.      . zolpidem (AMBIEN) 10 MG tablet Take 10 mg by mouth at bedtime.      Marland Kitchen levofloxacin (LEVAQUIN) 750 MG tablet Take 1 tablet (750 mg total) by mouth daily.  5 tablet  0   No facility-administered medications prior to visit.    PAST MEDICAL HISTORY: Past Medical History  Diagnosis Date  . Multiple sclerosis   . BPH (benign prostatic hyperplasia)   . Prostatitis, chronic   . Allergy   . GERD (gastroesophageal reflux disease)   . COPD (chronic obstructive pulmonary disease)   . Adenomatous polyp   . Neuromuscular disorder     MS  . Multiple sclerosis     PAST SURGICAL HISTORY: Past Surgical History  Procedure Laterality Date  . Hernia repair  [id number]    3 inguinal hernia repairs on left  . Hernia repair  06/16/11    right inguinal hernia repair with mesh   . Prostate surgery      FAMILY HISTORY: Family History  Problem Relation Age of Onset  . Depression Mother   . Dementia Father   . Pneumonia Father     SOCIAL HISTORY:  History   Social History  . Marital Status: Married    Spouse Name: Peggy    Number of Children: N/A  . Years of Education: Therapist, nutritional   Occupational History  . Retired     Social History Main Topics  . Smoking status: Former Smoker    Quit date: 09/20/1975  . Smokeless tobacco: Never Used  . Alcohol Use: No     Comment: Quit: 2003  . Drug Use: No  . Sexually Active: Not on file   Other Topics Concern  . Not on file   Social History Narrative   Pt lives at home with his spouse.    Caffeine: Quit in 2003     PHYSICAL EXAM  Filed Vitals:   01/22/13 1448  BP: 107/69  Pulse: 89  Height: 6\' 2"  (1.88 m)  Weight: 181 lb (82.101 kg)   Body mass index is 23.23 kg/(m^2).  GENERAL EXAM: Patient is in no distress  CARDIOVASCULAR: Regular rate and rhythm, no murmurs, no carotid bruits  NEUROLOGIC: MENTAL STATUS: awake, alert, language fluent, comprehension intact, naming  intact CRANIAL NERVE: no papilledema on fundoscopic exam, pupils equal and reactive to light, visual fields full to confrontation, extraocular muscles intact, no nystagmus, facial sensation and strength symmetric, uvula midline, shoulder shrug symmetric, tongue midline. MOTOR: INCREASED TONE IN RUE AND RLE; RIGHT TRICEPS 4, GRIP 4. RIGHT HF 4. RIGHT DF 3.  SENSORY: normal and symmetric to light touch, pinprick, temperature, vibration and proprioception COORDINATION: finger-nose-finger, fine finger movements normal REFLEXES: deep tendon reflexes present and symmetric GAIT/STATION: narrow based gait; able to walk on toes, heels and tandem; romberg is negative   DIAGNOSTIC DATA (LABS, IMAGING, TESTING) - I reviewed patient records, labs, notes, testing and imaging myself where available.  Lab Results  Component Value Date   WBC 7.9 01/21/2013   HGB 14.5 01/21/2013   HCT 42.3 01/21/2013   MCV 83.4 01/21/2013   PLT 217 01/21/2013      Component Value Date/Time   NA 138 01/21/2013 1230   K 4.1 01/21/2013 1230   CL 102 01/21/2013 1230   CO2 26 01/21/2013 1230   GLUCOSE 76 01/21/2013 1230   BUN 18 01/21/2013 1230   CREATININE 0.82 01/21/2013 1230   CREATININE 0.85 01/15/2013 0519   CALCIUM 9.2  01/21/2013 1230   PROT 6.4 01/21/2013 1230   ALBUMIN 3.9 01/21/2013 1230   AST 17 01/21/2013 1230   ALT 16 01/21/2013 1230   ALKPHOS 57 01/21/2013 1230   BILITOT 0.5 01/21/2013 1230   GFRNONAA 85* 01/15/2013 0519   GFRAA >90 01/15/2013 0519   Lab Results  Component Value Date   CHOL 184 01/13/2013   HDL 47 01/13/2013   LDLCALC 122* 01/13/2013   TRIG 77 01/13/2013   CHOLHDL 3.9 01/13/2013   Lab Results  Component Value Date   HGBA1C 5.7* 01/13/2013   Lab Results  Component Value Date   VITAMINB12 679 01/21/2013   Lab Results  Component Value Date   TSH 0.484 01/13/2013    01/13/13 MRI brain (w/wo) - Moderate to advanced atrophy. Moderate to advanced chronic white matter changes similar to 2012. This may be due to multiple sclerosis and/or chronic microvascular ischemia. There is no restricted diffusion or abnormal enhancement. No change from the prior study.  01/13/13 MRI cervical (w/wo) - Subtle areas of signal abnormality at C2 on the right and C5-6 are unchanged from prior study. No enhancing lesions. Cervical spondylosis without spinal stenosis.    ASSESSMENT AND PLAN  72 y.o. year old male  has a past medical history of Multiple sclerosis; BPH (benign prostatic hyperplasia); Prostatitis, chronic; Allergy; GERD (gastroesophageal reflux disease); COPD (chronic obstructive pulmonary disease); Adenomatous polyp; Neuromuscular disorder; and Multiple sclerosis. here with multiple sclerosis (?primary progressive vs. secondary progressive). Also with progressive short term memory loss and cognitive decline (? MS cognitive dysfunction versus neurodegenerative dementia).  PLAN: 1. Start namenda XR 2. Continue avonex  3. Stop driving (due to right sided weakness and cognitive decline)  Meds ordered this encounter  Medications  . Memantine HCl ER (NAMENDA XR) 28 MG CP24    Sig: Take 28 mg by mouth daily.    Dispense:  30 capsule    Refill:  12     Suanne Marker, MD 01/22/2013, 3:46  PM Certified in Neurology, Neurophysiology and Neuroimaging  [facility] [address], [address] [place], [place] [address] [phone]

## 2013-01-22 NOTE — Patient Instructions (Signed)
See next week

## 2013-01-23 DIAGNOSIS — G35 Multiple sclerosis: Secondary | ICD-10-CM | POA: Diagnosis not present

## 2013-01-23 DIAGNOSIS — J449 Chronic obstructive pulmonary disease, unspecified: Secondary | ICD-10-CM | POA: Diagnosis not present

## 2013-01-23 DIAGNOSIS — IMO0001 Reserved for inherently not codable concepts without codable children: Secondary | ICD-10-CM | POA: Diagnosis not present

## 2013-01-23 DIAGNOSIS — R5381 Other malaise: Secondary | ICD-10-CM | POA: Diagnosis not present

## 2013-01-23 DIAGNOSIS — N400 Enlarged prostate without lower urinary tract symptoms: Secondary | ICD-10-CM | POA: Diagnosis not present

## 2013-01-25 DIAGNOSIS — G35 Multiple sclerosis: Secondary | ICD-10-CM | POA: Diagnosis not present

## 2013-01-25 DIAGNOSIS — IMO0001 Reserved for inherently not codable concepts without codable children: Secondary | ICD-10-CM | POA: Diagnosis not present

## 2013-01-25 DIAGNOSIS — J449 Chronic obstructive pulmonary disease, unspecified: Secondary | ICD-10-CM | POA: Diagnosis not present

## 2013-01-25 DIAGNOSIS — R5381 Other malaise: Secondary | ICD-10-CM | POA: Diagnosis not present

## 2013-01-25 DIAGNOSIS — N400 Enlarged prostate without lower urinary tract symptoms: Secondary | ICD-10-CM | POA: Diagnosis not present

## 2013-01-26 ENCOUNTER — Encounter: Payer: Self-pay | Admitting: Diagnostic Neuroimaging

## 2013-01-28 ENCOUNTER — Ambulatory Visit (INDEPENDENT_AMBULATORY_CARE_PROVIDER_SITE_OTHER): Payer: Medicare Other | Admitting: *Deleted

## 2013-01-28 DIAGNOSIS — N400 Enlarged prostate without lower urinary tract symptoms: Secondary | ICD-10-CM | POA: Diagnosis not present

## 2013-01-28 DIAGNOSIS — J449 Chronic obstructive pulmonary disease, unspecified: Secondary | ICD-10-CM | POA: Diagnosis not present

## 2013-01-28 DIAGNOSIS — R5381 Other malaise: Secondary | ICD-10-CM | POA: Diagnosis not present

## 2013-01-28 DIAGNOSIS — IMO0001 Reserved for inherently not codable concepts without codable children: Secondary | ICD-10-CM | POA: Diagnosis not present

## 2013-01-28 DIAGNOSIS — G35D Multiple sclerosis, unspecified: Secondary | ICD-10-CM | POA: Diagnosis not present

## 2013-01-28 DIAGNOSIS — G35 Multiple sclerosis: Secondary | ICD-10-CM

## 2013-01-28 NOTE — Progress Notes (Signed)
Pt here for [name] injection.  Under aseptic technique [name] 10mcg/0.5ml IM given R thigh.  Tolerated well.  Bandaid applied.   Gave  wife the refill # for [employer].   We have 2 more injections left in our refrigerator.

## 2013-01-28 NOTE — Patient Instructions (Signed)
See next Monday at 1130.

## 2013-01-30 DIAGNOSIS — G35 Multiple sclerosis: Secondary | ICD-10-CM | POA: Diagnosis not present

## 2013-01-30 DIAGNOSIS — N400 Enlarged prostate without lower urinary tract symptoms: Secondary | ICD-10-CM | POA: Diagnosis not present

## 2013-01-30 DIAGNOSIS — IMO0001 Reserved for inherently not codable concepts without codable children: Secondary | ICD-10-CM | POA: Diagnosis not present

## 2013-01-30 DIAGNOSIS — R5383 Other fatigue: Secondary | ICD-10-CM | POA: Diagnosis not present

## 2013-01-30 DIAGNOSIS — J449 Chronic obstructive pulmonary disease, unspecified: Secondary | ICD-10-CM | POA: Diagnosis not present

## 2013-01-31 DIAGNOSIS — IMO0001 Reserved for inherently not codable concepts without codable children: Secondary | ICD-10-CM | POA: Diagnosis not present

## 2013-01-31 DIAGNOSIS — N400 Enlarged prostate without lower urinary tract symptoms: Secondary | ICD-10-CM | POA: Diagnosis not present

## 2013-01-31 DIAGNOSIS — G35 Multiple sclerosis: Secondary | ICD-10-CM | POA: Diagnosis not present

## 2013-01-31 DIAGNOSIS — J449 Chronic obstructive pulmonary disease, unspecified: Secondary | ICD-10-CM | POA: Diagnosis not present

## 2013-01-31 DIAGNOSIS — R5381 Other malaise: Secondary | ICD-10-CM | POA: Diagnosis not present

## 2013-02-04 ENCOUNTER — Ambulatory Visit (INDEPENDENT_AMBULATORY_CARE_PROVIDER_SITE_OTHER): Payer: Medicare Other | Admitting: *Deleted

## 2013-02-04 DIAGNOSIS — G35 Multiple sclerosis: Secondary | ICD-10-CM

## 2013-02-04 MED ORDER — INTERFERON BETA-1A 30 MCG/0.5ML IM KIT
30.0000 ug | PACK | Freq: Once | INTRAMUSCULAR | Status: AC
  Administered 2013-02-04 (×2): 30 ug via INTRAMUSCULAR

## 2013-02-04 NOTE — Progress Notes (Signed)
Pt here for [name] injection.  Under aseptic technique [name] 30mcg/0.5ml IM given R gluteal. Tolerated well.  Bandaid applied.  

## 2013-02-04 NOTE — Patient Instructions (Addendum)
Will see next week 

## 2013-02-12 ENCOUNTER — Ambulatory Visit (INDEPENDENT_AMBULATORY_CARE_PROVIDER_SITE_OTHER): Payer: Medicare Other | Admitting: *Deleted

## 2013-02-12 DIAGNOSIS — G35 Multiple sclerosis: Secondary | ICD-10-CM

## 2013-02-12 NOTE — Progress Notes (Signed)
Pt here for [name] injection.  Under aseptic technique [name] 30mcg/0.5ml IM given L deltoid. Tolerated well.  Bandaid applied.  

## 2013-02-12 NOTE — Patient Instructions (Addendum)
See pt next week

## 2013-02-17 NOTE — Patient Instructions (Addendum)
See neurologist tomorrow for further evaluation.

## 2013-02-18 ENCOUNTER — Ambulatory Visit (INDEPENDENT_AMBULATORY_CARE_PROVIDER_SITE_OTHER): Payer: Medicare Other | Admitting: *Deleted

## 2013-02-18 DIAGNOSIS — G35 Multiple sclerosis: Secondary | ICD-10-CM | POA: Diagnosis not present

## 2013-02-18 NOTE — Patient Instructions (Signed)
Pt to return next Monday at 1100 for [name] injection.

## 2013-02-18 NOTE — Progress Notes (Signed)
Pt here for [name] injection.  Under aseptic technique [name] 30mcg/0.5ml IM given R deltoid. Tolerated well.  Bandaid applied.  

## 2013-02-25 ENCOUNTER — Ambulatory Visit (INDEPENDENT_AMBULATORY_CARE_PROVIDER_SITE_OTHER): Payer: Medicare Other | Admitting: *Deleted

## 2013-02-25 DIAGNOSIS — G35 Multiple sclerosis: Secondary | ICD-10-CM

## 2013-02-25 NOTE — Progress Notes (Signed)
Pt here for [name] injection.  Under aseptic technique [name] 36mcg/0.5ml IM given L thigh.  Tolerated well.  Bandaid applied.

## 2013-02-25 NOTE — Patient Instructions (Addendum)
Return in one week.  

## 2013-03-03 ENCOUNTER — Other Ambulatory Visit: Payer: Self-pay | Admitting: Internal Medicine

## 2013-03-04 ENCOUNTER — Ambulatory Visit (INDEPENDENT_AMBULATORY_CARE_PROVIDER_SITE_OTHER): Payer: Medicare Other | Admitting: *Deleted

## 2013-03-04 DIAGNOSIS — G35 Multiple sclerosis: Secondary | ICD-10-CM

## 2013-03-04 MED ORDER — INTERFERON BETA-1A 30 MCG/0.5ML IM KIT
30.0000 ug | PACK | INTRAMUSCULAR | Status: AC
  Administered 2013-03-04 – 2013-04-08 (×6): 30 ug via INTRAMUSCULAR

## 2013-03-04 NOTE — Progress Notes (Signed)
Pt here for [name] injection.  Under aseptic technique [name] 30mcg/0.5ml IM given L gluteal. Tolerated well.  Bandaid applied.  

## 2013-03-04 NOTE — Patient Instructions (Addendum)
Pt to return next week.   I would be out of the office next week, but other nurse or CMA will be taking care of him.   He verbalized understanding.

## 2013-03-11 ENCOUNTER — Ambulatory Visit (INDEPENDENT_AMBULATORY_CARE_PROVIDER_SITE_OTHER): Payer: Medicare Other

## 2013-03-11 DIAGNOSIS — G35 Multiple sclerosis: Secondary | ICD-10-CM

## 2013-03-11 NOTE — Progress Notes (Signed)
Patient here for weekly avonex injection. Patient uses his own medication.  [name] 73mcg/0.5ml IM given R gluteal, under aseptic technique. Tolerated well. Bandaid applied

## 2013-03-11 NOTE — Patient Instructions (Signed)
Patient has a new supply of [name]. We used last one from old box.

## 2013-03-18 ENCOUNTER — Ambulatory Visit (INDEPENDENT_AMBULATORY_CARE_PROVIDER_SITE_OTHER): Payer: Medicare Other

## 2013-03-18 DIAGNOSIS — G35 Multiple sclerosis: Secondary | ICD-10-CM | POA: Diagnosis not present

## 2013-03-18 NOTE — Patient Instructions (Signed)
Return in one week.  

## 2013-03-18 NOTE — Progress Notes (Signed)
Patient here for weekly avonex injection. [name] 31mcg/0.5ml administered into left upper outer glutteus under aseptic technique. States, I didn't feel anything.

## 2013-03-25 ENCOUNTER — Ambulatory Visit (INDEPENDENT_AMBULATORY_CARE_PROVIDER_SITE_OTHER): Payer: Medicare Other | Admitting: *Deleted

## 2013-03-25 DIAGNOSIS — G35 Multiple sclerosis: Secondary | ICD-10-CM | POA: Diagnosis not present

## 2013-03-25 NOTE — Progress Notes (Signed)
Pt here for [name] injection.  Under aseptic technique [name] 30mcg/0.5ml IM given L deltoid. Tolerated well.  Bandaid applied.  

## 2013-03-25 NOTE — Patient Instructions (Signed)
See next week

## 2013-04-01 ENCOUNTER — Ambulatory Visit (INDEPENDENT_AMBULATORY_CARE_PROVIDER_SITE_OTHER): Payer: Medicare Other | Admitting: *Deleted

## 2013-04-01 DIAGNOSIS — G35 Multiple sclerosis: Secondary | ICD-10-CM | POA: Diagnosis not present

## 2013-04-01 NOTE — Progress Notes (Signed)
Pt here for [name] injection.  Under aseptic technique [name] 30mcg/0.5ml IM given R deltoid. Tolerated well.  Bandaid applied.  

## 2013-04-01 NOTE — Patient Instructions (Addendum)
Pt to return next week for injection.   Will bring medication with him.

## 2013-04-04 DIAGNOSIS — H26499 Other secondary cataract, unspecified eye: Secondary | ICD-10-CM | POA: Diagnosis not present

## 2013-04-08 ENCOUNTER — Ambulatory Visit (INDEPENDENT_AMBULATORY_CARE_PROVIDER_SITE_OTHER): Payer: Medicare Other | Admitting: *Deleted

## 2013-04-08 DIAGNOSIS — G35 Multiple sclerosis: Secondary | ICD-10-CM | POA: Diagnosis not present

## 2013-04-08 NOTE — Patient Instructions (Signed)
Pt to return next Monday and bring his medication with him.

## 2013-04-08 NOTE — Progress Notes (Signed)
Pt here for [name] injection.  Under aseptic technique [name] 30mcg/0.5ml IM given L gluteal. Tolerated well.  Bandaid applied.  

## 2013-04-15 ENCOUNTER — Ambulatory Visit: Payer: Self-pay

## 2013-04-15 ENCOUNTER — Ambulatory Visit (INDEPENDENT_AMBULATORY_CARE_PROVIDER_SITE_OTHER): Payer: Medicare Other | Admitting: *Deleted

## 2013-04-15 DIAGNOSIS — G35 Multiple sclerosis: Secondary | ICD-10-CM

## 2013-04-15 MED ORDER — INTERFERON BETA-1A 30 MCG/0.5ML IM KIT
30.0000 ug | PACK | INTRAMUSCULAR | Status: AC
  Administered 2013-04-15 – 2013-05-27 (×6): 30 ug via INTRAMUSCULAR

## 2013-04-15 NOTE — Progress Notes (Signed)
Pt here for [name] injection.  Under aseptic technique [name] 30mcg/0.5ml IM given L Deltoid. Tolerated well.  Bandaid applied.  

## 2013-04-17 DIAGNOSIS — Z125 Encounter for screening for malignant neoplasm of prostate: Secondary | ICD-10-CM | POA: Diagnosis not present

## 2013-04-17 DIAGNOSIS — R972 Elevated prostate specific antigen [PSA]: Secondary | ICD-10-CM | POA: Diagnosis not present

## 2013-04-17 DIAGNOSIS — N319 Neuromuscular dysfunction of bladder, unspecified: Secondary | ICD-10-CM | POA: Diagnosis not present

## 2013-04-17 DIAGNOSIS — G35 Multiple sclerosis: Secondary | ICD-10-CM | POA: Diagnosis not present

## 2013-04-22 ENCOUNTER — Ambulatory Visit (INDEPENDENT_AMBULATORY_CARE_PROVIDER_SITE_OTHER): Payer: Medicare Other | Admitting: *Deleted

## 2013-04-22 ENCOUNTER — Encounter (INDEPENDENT_AMBULATORY_CARE_PROVIDER_SITE_OTHER): Payer: Medicare Other | Admitting: *Deleted

## 2013-04-22 DIAGNOSIS — G35 Multiple sclerosis: Secondary | ICD-10-CM

## 2013-04-22 DIAGNOSIS — Z0289 Encounter for other administrative examinations: Secondary | ICD-10-CM

## 2013-04-22 NOTE — Progress Notes (Signed)
Pt here for B 12 injection.  Under aseptic technique cyanocobalamin 1075mcg/1ml IM given R deltoid.  Tolerated well.  Bandaid applied.  This encounter was created in error - please disregard. This encounter was created in error - please disregard. This encounter was created in error - please disregard.

## 2013-04-22 NOTE — Patient Instructions (Signed)
Pt to return next week.

## 2013-04-22 NOTE — Progress Notes (Signed)
Pt here for [name] injection.  Under aseptic technique [name] 30mcg/0.5ml IM given R deltoid. Tolerated well.  Bandaid applied.  

## 2013-04-22 NOTE — Patient Instructions (Addendum)
Pt to come in next week

## 2013-04-29 ENCOUNTER — Ambulatory Visit: Payer: Self-pay

## 2013-05-06 ENCOUNTER — Ambulatory Visit (INDEPENDENT_AMBULATORY_CARE_PROVIDER_SITE_OTHER): Payer: Medicare Other | Admitting: *Deleted

## 2013-05-06 ENCOUNTER — Encounter: Payer: Self-pay | Admitting: *Deleted

## 2013-05-06 DIAGNOSIS — G35 Multiple sclerosis: Secondary | ICD-10-CM

## 2013-05-06 NOTE — Progress Notes (Signed)
Pt here for [name] injection.  Under aseptic technique [name] 30mcg/0.5ml IM given L deltoid. Tolerated well.  Bandaid applied.  

## 2013-05-06 NOTE — Progress Notes (Signed)
Pt here for [name] injection.  Under aseptic technique [name] 63mcg/0.5ml IM given L deltoid. Tolerated well.  Bandaid applied.   This encounter was created in error - please disregard.

## 2013-05-06 NOTE — Patient Instructions (Addendum)
See next week.   He was out of town last week.  He forgot to tell me.

## 2013-05-10 ENCOUNTER — Encounter: Payer: Self-pay | Admitting: Diagnostic Neuroimaging

## 2013-05-13 ENCOUNTER — Ambulatory Visit (INDEPENDENT_AMBULATORY_CARE_PROVIDER_SITE_OTHER): Payer: Medicare Other | Admitting: Neurology

## 2013-05-13 DIAGNOSIS — G35 Multiple sclerosis: Secondary | ICD-10-CM | POA: Diagnosis not present

## 2013-05-13 NOTE — Patient Instructions (Signed)
Patient will return next week for another Avenox injection.

## 2013-05-13 NOTE — Progress Notes (Signed)
Patient here for Avenox injection.  Under aseptic technique Avenox 30mcg/0.5ml given IM in right deltoid.  Tolerated well.  Band-Aid applied. 

## 2013-05-21 ENCOUNTER — Ambulatory Visit (INDEPENDENT_AMBULATORY_CARE_PROVIDER_SITE_OTHER): Payer: Medicare Other | Admitting: *Deleted

## 2013-05-21 DIAGNOSIS — G35 Multiple sclerosis: Secondary | ICD-10-CM

## 2013-05-21 NOTE — Progress Notes (Signed)
Pt here for [name] injection.  Under aseptic technique [name] 40mcg/0.5ml IM given R thigh. Tolerated well.  Bandaid applied.

## 2013-05-21 NOTE — Patient Instructions (Addendum)
See next week

## 2013-05-22 ENCOUNTER — Telehealth: Payer: Self-pay | Admitting: Diagnostic Neuroimaging

## 2013-05-22 DIAGNOSIS — G35 Multiple sclerosis: Secondary | ICD-10-CM

## 2013-05-22 DIAGNOSIS — R413 Other amnesia: Secondary | ICD-10-CM

## 2013-05-22 MED ORDER — MEMANTINE HCL ER 28 MG PO CP24: 1.0000 | ORAL_CAPSULE | Freq: Every day | ORAL | Status: DC

## 2013-05-22 NOTE — Telephone Encounter (Signed)
Rx Sent  

## 2013-05-27 ENCOUNTER — Ambulatory Visit (INDEPENDENT_AMBULATORY_CARE_PROVIDER_SITE_OTHER): Payer: Medicare Other | Admitting: *Deleted

## 2013-05-27 DIAGNOSIS — G35 Multiple sclerosis: Secondary | ICD-10-CM | POA: Diagnosis not present

## 2013-05-27 NOTE — Progress Notes (Signed)
Pt here for [name] injection.  Under aseptic technique [name] 30mcg/0.5ml IM given R deltoid. Tolerated well.  Bandaid applied.  

## 2013-05-27 NOTE — Patient Instructions (Signed)
See next Monday.  

## 2013-06-03 ENCOUNTER — Ambulatory Visit (INDEPENDENT_AMBULATORY_CARE_PROVIDER_SITE_OTHER): Payer: Medicare Other | Admitting: *Deleted

## 2013-06-03 DIAGNOSIS — G35 Multiple sclerosis: Secondary | ICD-10-CM

## 2013-06-03 MED ORDER — INTERFERON BETA-1A 30 MCG/0.5ML IM KIT
30.0000 ug | PACK | INTRAMUSCULAR | Status: AC
  Administered 2013-06-03 – 2013-07-01 (×5): 30 ug via INTRAMUSCULAR

## 2013-06-03 NOTE — Patient Instructions (Signed)
To see next week

## 2013-06-03 NOTE — Progress Notes (Signed)
Pt here for [name] injection.  Under aseptic technique [name] 30mcg/0.5ml IM given R gluteal. Tolerated well.  Bandaid applied.  

## 2013-06-10 ENCOUNTER — Ambulatory Visit (INDEPENDENT_AMBULATORY_CARE_PROVIDER_SITE_OTHER): Payer: Medicare Other | Admitting: *Deleted

## 2013-06-10 DIAGNOSIS — G35 Multiple sclerosis: Secondary | ICD-10-CM | POA: Diagnosis not present

## 2013-06-10 NOTE — Progress Notes (Signed)
Pt here for [name] injection.  Under aseptic technique [name] 30mcg/0.5ml IM given L deltoid. Tolerated well.  Bandaid applied.  

## 2013-06-10 NOTE — Patient Instructions (Signed)
See next week

## 2013-06-17 ENCOUNTER — Ambulatory Visit (INDEPENDENT_AMBULATORY_CARE_PROVIDER_SITE_OTHER): Payer: Medicare Other | Admitting: Neurology

## 2013-06-17 DIAGNOSIS — G35D Multiple sclerosis, unspecified: Secondary | ICD-10-CM

## 2013-06-17 DIAGNOSIS — G35 Multiple sclerosis: Secondary | ICD-10-CM | POA: Diagnosis not present

## 2013-06-17 NOTE — Progress Notes (Signed)
Patient here for Avenox injection.  Under aseptic technique Avenox 30mcg/0.5ml given IM in right deltoid.  Tolerated well.  Band-Aid applied. 

## 2013-06-17 NOTE — Patient Instructions (Signed)
Patient will return next week for Avenox injection. 

## 2013-06-24 ENCOUNTER — Ambulatory Visit (INDEPENDENT_AMBULATORY_CARE_PROVIDER_SITE_OTHER): Payer: Medicare Other | Admitting: *Deleted

## 2013-06-24 DIAGNOSIS — G35 Multiple sclerosis: Secondary | ICD-10-CM

## 2013-06-24 NOTE — Patient Instructions (Signed)
See next Monday.  

## 2013-06-24 NOTE — Progress Notes (Signed)
Pt here for [name] injection.  Under aseptic technique [name] 30mcg/0.5ml IM given R gluteal. Tolerated well.  Bandaid applied.  

## 2013-07-01 ENCOUNTER — Ambulatory Visit (INDEPENDENT_AMBULATORY_CARE_PROVIDER_SITE_OTHER): Payer: Medicare Other | Admitting: *Deleted

## 2013-07-01 DIAGNOSIS — G35 Multiple sclerosis: Secondary | ICD-10-CM | POA: Diagnosis not present

## 2013-07-01 NOTE — Progress Notes (Signed)
Pt here for [name] injection.  Under aseptic technique [name] 30mcg/0.5ml IM given L Deltoid. Tolerated well.  Bandaid applied.  

## 2013-07-01 NOTE — Patient Instructions (Signed)
Pt to return next week.

## 2013-07-08 ENCOUNTER — Ambulatory Visit (INDEPENDENT_AMBULATORY_CARE_PROVIDER_SITE_OTHER): Payer: Medicare Other | Admitting: *Deleted

## 2013-07-08 DIAGNOSIS — G35 Multiple sclerosis: Secondary | ICD-10-CM

## 2013-07-08 MED ORDER — INTERFERON BETA-1A 30 MCG/0.5ML IM KIT
30.0000 ug | PACK | INTRAMUSCULAR | Status: AC
  Administered 2013-07-08 – 2013-08-12 (×6): 30 ug via INTRAMUSCULAR

## 2013-07-08 NOTE — Patient Instructions (Signed)
See next week

## 2013-07-08 NOTE — Progress Notes (Signed)
Pt here for [name] injection.  Under aseptic technique [name] 30mcg/0.5ml IM given R deltoid. Tolerated well.  Bandaid applied.  

## 2013-07-15 ENCOUNTER — Ambulatory Visit (INDEPENDENT_AMBULATORY_CARE_PROVIDER_SITE_OTHER): Payer: Medicare Other | Admitting: *Deleted

## 2013-07-15 DIAGNOSIS — G35 Multiple sclerosis: Secondary | ICD-10-CM | POA: Diagnosis not present

## 2013-07-15 DIAGNOSIS — G35D Multiple sclerosis, unspecified: Secondary | ICD-10-CM

## 2013-07-15 NOTE — Patient Instructions (Signed)
See next week

## 2013-07-15 NOTE — Progress Notes (Signed)
Pt here for [name] injection.  Under aseptic technique [name] 30mcg/0.5ml IM given L deltoid. Tolerated well.  Bandaid applied.  

## 2013-07-22 ENCOUNTER — Ambulatory Visit (INDEPENDENT_AMBULATORY_CARE_PROVIDER_SITE_OTHER): Payer: Medicare Other | Admitting: *Deleted

## 2013-07-22 DIAGNOSIS — G35 Multiple sclerosis: Secondary | ICD-10-CM

## 2013-07-22 NOTE — Progress Notes (Signed)
Pt here for [name] injection.  Under aseptic technique [name] 30mcg/0.5ml IM given R deltoid. Tolerated well.  Bandaid applied.  

## 2013-07-22 NOTE — Patient Instructions (Signed)
See this Thursday for appt with Dr. Marjory Lies.

## 2013-07-25 ENCOUNTER — Other Ambulatory Visit: Payer: Self-pay

## 2013-07-25 ENCOUNTER — Ambulatory Visit (INDEPENDENT_AMBULATORY_CARE_PROVIDER_SITE_OTHER): Payer: Medicare Other | Admitting: Diagnostic Neuroimaging

## 2013-07-25 ENCOUNTER — Encounter: Payer: Self-pay | Admitting: Diagnostic Neuroimaging

## 2013-07-25 VITALS — BP 104/69 | HR 85 | Temp 97.0°F | Ht 74.0 in | Wt 190.0 lb

## 2013-07-25 DIAGNOSIS — G35 Multiple sclerosis: Secondary | ICD-10-CM | POA: Diagnosis not present

## 2013-07-25 NOTE — Progress Notes (Signed)
[facility]  PATIENT: [name] DOB: 19-Nov-1940  REFERRING CLINICIAN:  HISTORY FROM: patient and wife REASON FOR VISIT: follow up   HISTORICAL  CHIEF COMPLAINT:  Chief Complaint  Patient presents with  . Multiple Sclerosis    RV #6  . Memory Loss    HISTORY OF PRESENT ILLNESS:   UPDATE 07/25/13: Since last visit, continued progression of memory decline, gait difficulty. [name] recently at night, possibly related to [name] usage. Also is [facility]; has non-resident resident status.  UPDATE 01/22/13: since last visit patient was hospitalized for upper respiratory infection and fever for 3 days. Patient was discharged 1 week ago. Patient had some exacerbation of MS symptoms. MRI of the brain and cervical spine showed chronic MS plaques with no acute plaques.  PRIOR HPI (10/25/12, Dr. [name]): 72 year old right-handed white married male with a ten-year history of progressive right arm and leg weakness without hyperreflexia and with atrophy of his right arm and right leg. Initial evaluation with EMG/NCV and  blood studies for neuropathy were unremarkable. Brain MRI 8/08 and 08/11/2007 and cervical MRI 10/21/2006 showed bilateral white matter findings without cord abnormalities.VER was abnormal on the left with delay and BAER was normal. CSF 10/17/2007 showed elevated IgG index and 12 bands. He was started on [name] in March 2009. MRI studies of the brain and cervical spine 11/28/08 and 11/29/08 one year later showed no change. 05/21/09 he developed transient global amnesia after intercourse and had an MRI study of the brain and intracrainal MRA at the [facility] emergency room which was unchanged. Neuropsychological testing for memory loss 07/06/09 showed deficiencies in visual working memory capacity, consistentcy of acquisition and retention of visual information, speed of visual sequencing and  procedural learning. 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 He was on Aricept for a while but this caused sleeplessness and he discontinued it. He had one fall while at [facility] when he got up at night. 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 He is no longer playing golf. He is not using his walking sticks, a cane or his short leg braces, a NESS 400 or AFO. He was on ampyra for one month and found benefit, noted by the patient and his Systems analyst. He also believes he has improvement in memory. Ampyra was discontinued because of increasing prostate pain.  He denies any bowel or bladder dysfunction.   He takes 5 mg of amitriptyline at night to help with bladder control, but is discontinuing it. He frequently sits on a pillow because of pain. He saw Dr. [name] 10/26/2011  for cystoscopy and cystometrogram, consistent with MS bladder. He is independent in his activities of daily living. CBC and CMP 06/11/12 were normal. MRI studies of the brain and cervical spine 10/05/2010 showed chronic demyelinating plaques and a new plaque in the left cerebellum versus 11/28/08.  He had a chronic demyelinating plaque at C2 without change versus 11/29/08. He has fatigue and takes a 15-20 minute unscheduled nap during the day. He has nocturia once or twice per night .He underwent his fourth hernia operation 06/16/11. He had cataract surgery on the left 01/2012  and right 2013.Marland Kitchen He notes progression of right hand and arm weakness with difficulty in his handwriting.He is not using his left hand to feed himself, but he  notices an awkward position with the fork in his right hand. He is driving limited distances. He tried taking Tecfidera  but developed severe nausea and vomiting after going to 240 twice a day.  He is on Rapaflow with better control of his bladder. His wife has noted memory problems.He has missed appointments. His  father had Alzheimer's disease. 10/25/2012=(MMSE27/30. Clock drawing task 3/4. Animal fluency test 14. [name] index of independence in activities of  daily living 6. [name] instrumental  activity of daily living scale 7.Neuropsychiatric inventory=0. Geriatric depression scale 3/15. Falls assessment tool score7. CNS-LS 8).   REVIEW OF SYSTEMS: Full 14 system review of systems performed and notable only for memory loss confusion weakness urination problems.  ALLERGIES: No Known Allergies  HOME MEDICATIONS: Outpatient Prescriptions Prior to Visit  Medication Sig Dispense Refill  . amitriptyline (ELAVIL) 25 MG tablet Take 25 mg by mouth at bedtime.       . Ascorbic Acid (VITAMIN C PO) Take by mouth daily.        Marland Kitchen aspirin 325 MG tablet Take 325 mg by mouth daily.        . Cholecalciferol (VITAMIN D PO) Take by mouth daily.        . interferon beta-1a ([name]) 30 MCG injection Inject 30 mcg into the muscle every 7 (seven) days.       . Memantine HCl ER (NAMENDA XR) 28 MG CP24 Take 28 mg by mouth daily.  30 capsule  6  . silodosin (RAPAFLO) 8 MG CAPS capsule Take 8 mg by mouth daily with breakfast.      . zolpidem (AMBIEN) 10 MG tablet Take 10 mg by mouth at bedtime.      Marland Kitchen atorvastatin (LIPITOR) 10 MG tablet take 1 tablet by mouth once daily  30 tablet  5   Facility-Administered Medications Prior to Visit  Medication Dose Route Frequency Provider Last Rate Last Dose  . interferon beta-1a ([name]) injection 30 mcg  30 mcg Intramuscular Weekly Suanne Marker, MD   30 mcg at 07/22/13 1116    PAST MEDICAL HISTORY: Past Medical History  Diagnosis Date  . Multiple sclerosis   . BPH (benign prostatic hyperplasia)   . Prostatitis, chronic   . Allergy   . GERD (gastroesophageal reflux disease)   . COPD (chronic obstructive pulmonary disease)   . Adenomatous polyp   . Neuromuscular disorder     MS  . Multiple sclerosis     PAST SURGICAL HISTORY: Past Surgical History  Procedure  Laterality Date  . Hernia repair  [id number]    3 inguinal hernia repairs on left  . Hernia repair  06/16/11    right inguinal hernia repair with mesh   . Prostate surgery      FAMILY HISTORY: Family History  Problem Relation Age of Onset  . Depression Mother   . Dementia Father   . Pneumonia Father     SOCIAL HISTORY:  History   Social History  . Marital Status: Married    Spouse Name: Peggy    Number of Children: 0  . Years of Education: Therapist, nutritional   Occupational History  . Retired    Social History Main Topics  . Smoking status: Former Smoker    Quit date: 09/20/1975  . Smokeless tobacco: Never Used  . Alcohol Use: No     Comment: Quit: 2003  . Drug Use: No  . Sexual Activity: No   Other Topics Concern  . Not on file  Social History Narrative   Pt lives at home with his spouse.    Caffeine: Quit in 2003     PHYSICAL EXAM  Filed Vitals:   07/25/13 1339  BP: 104/69  Pulse: 85  Temp: 97 F (36.1 C)  TempSrc: Oral  Height: 6\' 2"  (1.88 m)  Weight: 190 lb (86.183 kg)   Body mass index is 24.38 kg/(m^2).  GENERAL EXAM: Patient is in no distress; ELEVATED MOOD / EUPHORIA.  CARDIOVASCULAR: Regular rate and rhythm, no murmurs, no carotid bruits  NEUROLOGIC: MENTAL STATUS: awake, alert, language fluent, comprehension intact, naming intact; MMSE 28/30. CRANIAL NERVE: pupils equal and reactive to light, visual fields full to confrontation, extraocular muscles intact, SACCADIC BREAKDOWN OF SMOOTH PURSUIT. facial sensation and strength symmetric, uvula midline, shoulder shrug symmetric, tongue midline. MOTOR: INCREASED TONE IN RUE AND RLE; RIGHT TRICEPS 4, GRIP 4. RIGHT HF 4. RIGHT DF 3.  SENSORY: normal and symmetric to light touch COORDINATION: finger-nose-finger, fine finger movements SLOW IN RUE GAIT/STATION: SLOW, UNSTEADY GAIT. USES ROLLING WALKER. 25 FT WALK (12s, 13s)   DIAGNOSTIC DATA (LABS, IMAGING, TESTING) - I reviewed patient records, labs,  notes, testing and imaging myself where available.  Lab Results  Component Value Date   WBC 7.9 01/21/2013   HGB 14.5 01/21/2013   HCT 42.3 01/21/2013   MCV 83.4 01/21/2013   PLT 217 01/21/2013      Component Value Date/Time   NA 138 01/21/2013 1230   K 4.1 01/21/2013 1230   CL 102 01/21/2013 1230   CO2 26 01/21/2013 1230   GLUCOSE 76 01/21/2013 1230   BUN 18 01/21/2013 1230   CREATININE 0.82 01/21/2013 1230   CREATININE 0.85 01/15/2013 0519   CALCIUM 9.2 01/21/2013 1230   PROT 6.4 01/21/2013 1230   ALBUMIN 3.9 01/21/2013 1230   AST 17 01/21/2013 1230   ALT 16 01/21/2013 1230   ALKPHOS 57 01/21/2013 1230   BILITOT 0.5 01/21/2013 1230   GFRNONAA 85* 01/15/2013 0519   GFRAA >90 01/15/2013 0519   Lab Results  Component Value Date   CHOL 184 01/13/2013   HDL 47 01/13/2013   LDLCALC 122* 01/13/2013   TRIG 77 01/13/2013   CHOLHDL 3.9 01/13/2013   Lab Results  Component Value Date   HGBA1C 5.7* 01/13/2013   Lab Results  Component Value Date   VITAMINB12 679 01/21/2013   Lab Results  Component Value Date   TSH 0.484 01/13/2013    01/13/13 MRI brain (w/wo) - Moderate to advanced atrophy. Moderate to advanced chronic white matter changes similar to 2012. This may be due to multiple sclerosis and/or chronic microvascular ischemia. There is no restricted diffusion or abnormal enhancement. No change from the prior study.  01/13/13 MRI cervical (w/wo) - Subtle areas of signal abnormality at C2 on the right and C5-6 are unchanged from prior study. No enhancing lesions. Cervical spondylosis without spinal stenosis.    ASSESSMENT AND PLAN  72 y.o. year old male  has a past medical history of Multiple sclerosis; BPH (benign prostatic hyperplasia); Prostatitis, chronic; Allergy; GERD (gastroesophageal reflux disease); COPD (chronic obstructive pulmonary disease); Adenomatous polyp; Neuromuscular disorder; and Multiple sclerosis. here with multiple sclerosis (? primary progressive vs. secondary progressive). Also with progressive  short term memory loss and cognitive decline.  PLAN: 1. Continue namenda XR 2. Switch avonex to plegridy   Return in about 6 months (around 01/22/2014).    Suanne Marker, MD 07/25/2013, 3:03 PM Certified in Neurology, Neurophysiology and Neuroimaging  [facility]  [place], [place], [place] [address] [phone]

## 2013-07-25 NOTE — Patient Instructions (Signed)
I will switch avonex to plegridy.

## 2013-07-26 ENCOUNTER — Telehealth: Payer: Self-pay | Admitting: *Deleted

## 2013-07-26 NOTE — Telephone Encounter (Signed)
I called and let pt know to expect a call from Link Snuffer (social work backgound, chronic illness counseling, specializing in MS), this afternoon or Monday of next week.

## 2013-07-29 ENCOUNTER — Ambulatory Visit (INDEPENDENT_AMBULATORY_CARE_PROVIDER_SITE_OTHER): Payer: Medicare Other | Admitting: *Deleted

## 2013-07-29 DIAGNOSIS — G35 Multiple sclerosis: Secondary | ICD-10-CM | POA: Diagnosis not present

## 2013-07-29 NOTE — Progress Notes (Signed)
Pt here for [name] injection.  Under aseptic technique [name] 66mcg/0.5ml IM given  Tolerated well.  Bandaid applied.

## 2013-07-29 NOTE — Patient Instructions (Addendum)
See next week.  Had pt sign other spot on plegridy form.  Refaxed to [facility].

## 2013-08-05 ENCOUNTER — Ambulatory Visit (INDEPENDENT_AMBULATORY_CARE_PROVIDER_SITE_OTHER): Payer: Medicare Other | Admitting: *Deleted

## 2013-08-05 DIAGNOSIS — G35 Multiple sclerosis: Secondary | ICD-10-CM

## 2013-08-05 NOTE — Patient Instructions (Signed)
See next week.   Received plegridy fax that need PA.  Given to [place] in pharmacy.  Pt ordered another months supply.

## 2013-08-05 NOTE — Progress Notes (Signed)
Pt here for [name] injection.  Under aseptic technique [name] 30mcg/0.5ml IM given R gluteal. Tolerated well.  Bandaid applied.  

## 2013-08-08 DIAGNOSIS — F0280 Dementia in other diseases classified elsewhere without behavioral disturbance: Secondary | ICD-10-CM | POA: Diagnosis not present

## 2013-08-12 ENCOUNTER — Ambulatory Visit (INDEPENDENT_AMBULATORY_CARE_PROVIDER_SITE_OTHER): Payer: Medicare Other | Admitting: *Deleted

## 2013-08-12 DIAGNOSIS — G35 Multiple sclerosis: Secondary | ICD-10-CM | POA: Diagnosis not present

## 2013-08-12 NOTE — Progress Notes (Signed)
Pt here for [name] injection.  Under aseptic technique [name] 30mcg/0.5ml IM given L deltoid. Tolerated well.  Bandaid applied.  

## 2013-08-12 NOTE — Patient Instructions (Signed)
See next week

## 2013-08-19 ENCOUNTER — Ambulatory Visit (INDEPENDENT_AMBULATORY_CARE_PROVIDER_SITE_OTHER): Payer: Medicare Other | Admitting: *Deleted

## 2013-08-19 DIAGNOSIS — G35 Multiple sclerosis: Secondary | ICD-10-CM

## 2013-08-19 DIAGNOSIS — G35D Multiple sclerosis, unspecified: Secondary | ICD-10-CM

## 2013-08-19 MED ORDER — INTERFERON BETA-1A 30 MCG/0.5ML IM KIT
30.0000 ug | PACK | INTRAMUSCULAR | Status: AC
  Administered 2013-08-19 – 2013-09-02 (×3): 30 ug via INTRAMUSCULAR

## 2013-08-19 NOTE — Progress Notes (Signed)
Pt here for [name] injection.  Under aseptic technique [name] 30mcg/0.5ml IM given R deltoid. Tolerated well.  Bandaid applied.  

## 2013-08-19 NOTE — Patient Instructions (Signed)
See next week

## 2013-08-23 ENCOUNTER — Encounter: Payer: Self-pay | Admitting: Diagnostic Neuroimaging

## 2013-08-23 ENCOUNTER — Telehealth: Payer: Self-pay | Admitting: Diagnostic Neuroimaging

## 2013-08-23 NOTE — Telephone Encounter (Signed)
No break needed. Start plegridy 1 week after last avonex shot. -VRP

## 2013-08-23 NOTE — Telephone Encounter (Signed)
Please advise 

## 2013-08-24 NOTE — Telephone Encounter (Signed)
I called patient and spoke with his wife. I related Dr. Richrd Humbles message. Wife thanked [place] and had no other questions.

## 2013-08-25 ENCOUNTER — Telehealth: Payer: Self-pay

## 2013-08-25 NOTE — Telephone Encounter (Signed)
[employer] [facility] sent [place] a letter saying they have approved our request for coverage on [name] effective until 09/18/2014 Ref # [id number]

## 2013-08-26 ENCOUNTER — Ambulatory Visit (INDEPENDENT_AMBULATORY_CARE_PROVIDER_SITE_OTHER): Payer: Medicare Other | Admitting: *Deleted

## 2013-08-26 DIAGNOSIS — G35 Multiple sclerosis: Secondary | ICD-10-CM

## 2013-08-26 NOTE — Patient Instructions (Signed)
See next week

## 2013-08-26 NOTE — Progress Notes (Signed)
Pt here for [name] injection.  Under aseptic technique [name] 87mcg/0.5ml IM given R thigh. Tolerated well.  Bandaid applied.

## 2013-08-29 DIAGNOSIS — F0280 Dementia in other diseases classified elsewhere without behavioral disturbance: Secondary | ICD-10-CM | POA: Diagnosis not present

## 2013-09-02 ENCOUNTER — Ambulatory Visit (INDEPENDENT_AMBULATORY_CARE_PROVIDER_SITE_OTHER): Payer: Medicare Other | Admitting: Neurology

## 2013-09-02 DIAGNOSIS — G35 Multiple sclerosis: Secondary | ICD-10-CM

## 2013-09-02 NOTE — Telephone Encounter (Signed)
Called patient and provided information per MD

## 2013-09-02 NOTE — Patient Instructions (Signed)
Patient will start [name] next week at home.

## 2013-09-02 NOTE — Progress Notes (Signed)
Patient here for Avenox injection.  Under aseptic technique Avenox 30mcg/0.5ml given IM in left deltoid.  Tolerated well.  Band-Aid applied. 

## 2013-09-17 DIAGNOSIS — F0280 Dementia in other diseases classified elsewhere without behavioral disturbance: Secondary | ICD-10-CM | POA: Diagnosis not present

## 2013-10-10 DIAGNOSIS — F0280 Dementia in other diseases classified elsewhere without behavioral disturbance: Secondary | ICD-10-CM | POA: Diagnosis not present

## 2013-11-22 DIAGNOSIS — G35 Multiple sclerosis: Secondary | ICD-10-CM

## 2013-11-25 ENCOUNTER — Telehealth: Payer: Self-pay | Admitting: *Deleted

## 2013-11-25 NOTE — Telephone Encounter (Signed)
I called and spoke to [place] at [facility].  Pt there until 11-30-13.  Needing order for plegridy 155mcg IM every 14 days.

## 2013-11-25 NOTE — Telephone Encounter (Signed)
LMVM for them at [facility] re: clarification on what they need.  Last dose we gave of [name] 55mcg/0.5ml was 09-02-13 L deltoid.

## 2013-12-12 ENCOUNTER — Other Ambulatory Visit: Payer: Self-pay

## 2013-12-12 DIAGNOSIS — G35 Multiple sclerosis: Secondary | ICD-10-CM

## 2013-12-12 DIAGNOSIS — R413 Other amnesia: Secondary | ICD-10-CM

## 2013-12-12 MED ORDER — MEMANTINE HCL ER 28 MG PO CP24: 1.0000 | ORAL_CAPSULE | Freq: Every day | ORAL | Status: DC

## 2013-12-17 DIAGNOSIS — F09 Unspecified mental disorder due to known physiological condition: Secondary | ICD-10-CM | POA: Diagnosis not present

## 2013-12-17 DIAGNOSIS — G35 Multiple sclerosis: Secondary | ICD-10-CM | POA: Diagnosis not present

## 2013-12-18 ENCOUNTER — Other Ambulatory Visit: Payer: Self-pay | Admitting: Urology

## 2013-12-18 DIAGNOSIS — R3911 Hesitancy of micturition: Secondary | ICD-10-CM

## 2013-12-18 DIAGNOSIS — G35 Multiple sclerosis: Secondary | ICD-10-CM

## 2013-12-18 DIAGNOSIS — R972 Elevated prostate specific antigen [PSA]: Secondary | ICD-10-CM

## 2013-12-18 DIAGNOSIS — N319 Neuromuscular dysfunction of bladder, unspecified: Secondary | ICD-10-CM | POA: Diagnosis not present

## 2013-12-20 ENCOUNTER — Ambulatory Visit
Admission: RE | Admit: 2013-12-20 | Discharge: 2013-12-20 | Disposition: A | Discharge status: Home / Self Care | Payer: Medicare Other | Source: Ambulatory Visit | Attending: Urology | Admitting: Urology

## 2013-12-20 DIAGNOSIS — R972 Elevated prostate specific antigen [PSA]: Secondary | ICD-10-CM

## 2013-12-20 DIAGNOSIS — R3911 Hesitancy of micturition: Secondary | ICD-10-CM

## 2013-12-20 DIAGNOSIS — G35 Multiple sclerosis: Secondary | ICD-10-CM

## 2013-12-20 DIAGNOSIS — N319 Neuromuscular dysfunction of bladder, unspecified: Secondary | ICD-10-CM

## 2013-12-25 DIAGNOSIS — F0280 Dementia in other diseases classified elsewhere without behavioral disturbance: Secondary | ICD-10-CM | POA: Diagnosis not present

## 2014-01-10 DIAGNOSIS — F0280 Dementia in other diseases classified elsewhere without behavioral disturbance: Secondary | ICD-10-CM | POA: Diagnosis not present

## 2014-01-22 ENCOUNTER — Encounter: Payer: Self-pay | Admitting: Diagnostic Neuroimaging

## 2014-01-22 ENCOUNTER — Ambulatory Visit (INDEPENDENT_AMBULATORY_CARE_PROVIDER_SITE_OTHER): Payer: Medicare Other | Admitting: Diagnostic Neuroimaging

## 2014-01-22 VITALS — BP 108/65 | HR 73 | Temp 98.0°F | Ht 73.0 in | Wt 194.0 lb

## 2014-01-22 DIAGNOSIS — G35 Multiple sclerosis: Secondary | ICD-10-CM | POA: Diagnosis not present

## 2014-01-22 NOTE — Progress Notes (Signed)
[facility]  PATIENT: [name] DOB: Jan 03, 1941  REFERRING CLINICIAN:  HISTORY FROM: patient and wife  REASON FOR VISIT: follow up   HISTORICAL  CHIEF COMPLAINT:  Chief Complaint  Patient presents with  . Follow-up    MS    HISTORY OF PRESENT ILLNESS:   UPDATE 01/22/14: Since last visit, transitioned avonex to plegridy. Also saw Dr. George Hugh [facility] neurology/MS) in order to get referral to neuropsych cognitive therapy. Pt was rx'd amprya, but not yet started Herbalist). Overall, continued slow steady progression of symptoms.  UPDATE 07/25/13: Since last visit, continued progression of memory decline, gait difficulty. [name] recently at night, possibly related to [place] usage. Also is [facility]; has non-resident resident status.   UPDATE 01/22/13: since last visit patient was hospitalized for upper respiratory infection and fever for 3 days. Patient was discharged 1 week ago. Patient had some exacerbation of MS symptoms. MRI of the brain and cervical spine showed chronic MS plaques with no acute plaques.   PRIOR HPI (10/25/12, Dr. Erling Cruz): 73 year old right-handed white married male with a ten-year history of progressive right arm and leg weakness without hyperreflexia and with atrophy of his right arm and right leg. Initial evaluation with EMG/NCV and blood studies for neuropathy were unremarkable. Brain MRI 8/08 and 08/11/2007 and cervical MRI 10/21/2006 showed bilateral white matter findings without cord abnormalities.VER was abnormal on the left with delay and BAER was normal. CSF 10/17/2007 showed elevated IgG index and 12 bands. He was started on [name] in March 2009. MRI studies of the brain and cervical spine 11/28/08 and 11/29/08 one year later showed no change. 05/21/09 he developed transient global amnesia after intercourse and had an MRI study of the brain and intracrainal MRA at the [facility] emergency room  which was unchanged. Neuropsychological testing for memory loss 07/06/09 showed deficiencies in visual working memory capacity, consistentcy of acquisition and retention of visual information, speed of visual sequencing and procedural learning. 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 He was on Aricept for a while but this caused sleeplessness and he discontinued it. He had one fall while at [facility] when he got up at night. 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 He is no longer playing golf. He is not using his walking sticks, a cane or his short leg braces, a NESS 400 or AFO. He was on ampyra for one month and found benefit, noted by the patient and his Physiological scientist. He also believes he has improvement in memory. Ampyra was discontinued because of increasing prostate pain. He denies any bowel or bladder dysfunction. He takes 5 mg of amitriptyline at night to help with bladder control, but is discontinuing it. He frequently sits on a pillow because of pain. He saw Dr. Alona Bene 10/26/2011 for cystoscopy and cystometrogram, consistent with MS bladder. He is independent in his activities of daily living. CBC and CMP 06/11/12 were normal. MRI studies of the brain and cervical spine 10/05/2010 showed chronic demyelinating plaques and a new plaque in the left cerebellum versus 11/28/08. He had a chronic demyelinating plaque at C2 without change versus 11/29/08. He has fatigue and takes a 15-20 minute unscheduled nap during the day. He has nocturia once or twice per night .He underwent his fourth hernia operation 06/16/11. He had cataract surgery  on the left 01/2012 and right 2013.Marland Kitchen He notes progression of right hand and arm weakness with difficulty in his handwriting.He is not using his left hand to feed himself, but he notices an awkward position with the fork in his right hand. He is  driving limited distances. He tried taking Tecfidera but developed severe nausea and vomiting after going to 240 twice a day. He is on Rapaflow with better control of his bladder. His wife has noted memory problems.He has missed appointments. His father had Alzheimer's disease. 10/25/2012=(MMSE27/30. Clock drawing task 3/4. Animal fluency test 14. Ron Parker index of independence in activities of daily living 6. Bubba Camp instrumental activity of daily living scale 7.Neuropsychiatric inventory=0. Geriatric depression scale 3/15. Falls assessment tool score7. CNS-LS 8).   REVIEW OF SYSTEMS: Full 14 system review of systems performed and notable only for memory loss difficulty urinating prostate difficulty balance walking difficulty.  ALLERGIES: No Known Allergies  HOME MEDICATIONS: Outpatient Prescriptions Prior to Visit  Medication Sig Dispense Refill  . Ascorbic Acid (VITAMIN C PO) Take by mouth daily.        . Cholecalciferol (VITAMIN D PO) Take by mouth daily.        . silodosin (RAPAFLO) 8 MG CAPS capsule Take 8 mg by mouth daily with breakfast.      . zolpidem (AMBIEN) 10 MG tablet Take 10 mg by mouth at bedtime.      Marland Kitchen amitriptyline (ELAVIL) 25 MG tablet Take 25 mg by mouth at bedtime.       Marland Kitchen aspirin 325 MG tablet Take 325 mg by mouth daily.       . interferon beta-1a ([name]) 30 MCG injection Inject 30 mcg into the muscle every 7 (seven) days.       . Memantine HCl ER (NAMENDA XR) 28 MG CP24 Take 28 mg by mouth daily.  30 capsule  6   No facility-administered medications prior to visit.    PAST MEDICAL HISTORY: Past Medical History  Diagnosis Date  . Multiple sclerosis   . BPH (benign prostatic hyperplasia)   . Prostatitis, chronic   . Allergy   . GERD (gastroesophageal reflux disease)   . COPD (chronic obstructive pulmonary disease)   . Adenomatous polyp   . Neuromuscular disorder     MS  . Multiple sclerosis     PAST SURGICAL HISTORY: Past Surgical History  Procedure  Laterality Date  . Hernia repair  [id number]    3 inguinal hernia repairs on left  . Hernia repair  06/16/11    right inguinal hernia repair with mesh   . Prostate surgery      FAMILY HISTORY: Family History  Problem Relation Age of Onset  . Depression Mother   . Dementia Father   . Pneumonia Father     SOCIAL HISTORY:  History   Social History  . Marital Status: Married    Spouse Name: Peggy    Number of Children: 0  . Years of Education: Production manager   Occupational History  . Retired    Social History Main Topics  . Smoking status: Former Smoker    Quit date: 09/20/1975  . Smokeless tobacco: Never Used  . Alcohol Use: No     Comment: Quit: 2003  . Drug Use: No  . Sexual Activity: No   Other Topics Concern  . Not on file   Social History Narrative   Pt lives at home with his spouse.    Caffeine: Quit in 2003  PHYSICAL EXAM  Filed Vitals:   01/22/14 1458  BP: 108/65  Pulse: 73  Temp: 98 F (36.7 C)  TempSrc: Oral  Height: 6\' 1"  (1.854 m)  Weight: 194 lb (87.998 kg)    Not recorded    Body mass index is 25.6 kg/(m^2).  GENERAL EXAM:  Patient is in no distress; ELEVATED MOOD / EUPHORIA.   CARDIOVASCULAR:  Regular rate and rhythm, no murmurs, no carotid bruits   NEUROLOGIC:  MENTAL STATUS: awake, alert, language fluent, comprehension intact, naming intact; MMSE 25/30.  CRANIAL NERVE: pupils equal and reactive to light, visual fields full to confrontation, extraocular muscles intact, SACCADIC BREAKDOWN OF SMOOTH PURSUIT. facial sensation and strength symmetric, uvula midline, shoulder shrug symmetric, tongue midline.  MOTOR: INCREASED TONE IN RUE AND RLE; RIGHT TRICEPS 4, GRIP 4. RIGHT HF 4. RIGHT DF 2 (WITH AFO).  SENSORY: normal and symmetric to light touch  COORDINATION: finger-nose-finger, fine finger movements SLOW IN RUE  GAIT/STATION: SLOW, UNSTEADY GAIT. USES ROLLING WALKER.      DIAGNOSTIC DATA (LABS, IMAGING, TESTING) - I reviewed  patient records, labs, notes, testing and imaging myself where available.  Lab Results  Component Value Date   WBC 7.9 01/21/2013   HGB 14.5 01/21/2013   HCT 42.3 01/21/2013   MCV 83.4 01/21/2013   PLT 217 01/21/2013      Component Value Date/Time   NA 138 01/21/2013 1230   K 4.1 01/21/2013 1230   CL 102 01/21/2013 1230   CO2 26 01/21/2013 1230   GLUCOSE 76 01/21/2013 1230   BUN 18 01/21/2013 1230   CREATININE 0.82 01/21/2013 1230   CREATININE 0.85 01/15/2013 0519   CALCIUM 9.2 01/21/2013 1230   PROT 6.4 01/21/2013 1230   ALBUMIN 3.9 01/21/2013 1230   AST 17 01/21/2013 1230   ALT 16 01/21/2013 1230   ALKPHOS 57 01/21/2013 1230   BILITOT 0.5 01/21/2013 1230   GFRNONAA 85* 01/15/2013 0519   GFRAA >90 01/15/2013 0519   Lab Results  Component Value Date   CHOL 184 01/13/2013   HDL 47 01/13/2013   LDLCALC 122* 01/13/2013   TRIG 77 01/13/2013   CHOLHDL 3.9 01/13/2013   Lab Results  Component Value Date   HGBA1C 5.7* 01/13/2013   Lab Results  Component Value Date   PPIRJJOA41 660 01/21/2013   Lab Results  Component Value Date   TSH 0.484 01/13/2013    I reviewed images myself and agree with interpretation. -VRP  01/13/13 MRI brain (w/wo) - Moderate to advanced atrophy. Moderate to advanced chronic white matter changes similar to 2012. This may be due to multiple sclerosis and/or chronic microvascular ischemia. There is no restricted diffusion or abnormal enhancement. No change from the prior study.   01/13/13 MRI cervical (w/wo) - Subtle areas of signal abnormality at C2 on the right and C5-6 are unchanged from prior study. No enhancing lesions. Cervical spondylosis without spinal stenosis.     ASSESSMENT AND PLAN  73 y.o. year old male here with multiple sclerosis (? primary progressive vs. secondary progressive). Also with progressive short term memory loss and cognitive decline.   PLAN:  1. Continue plegridy  For 1 year (through end of 2015); if symptoms stabilze, then will continue on year by year basis. If  symptoms progress while on plegridy through end of 2015, then we will stop it. 2. Try ampyra. 3. Discuss favorable results of recent unblinded trial of high dose biotin (up to 300mg  daily) in PP[facility]. Will start more standard dose  of biotin 508mcg daily for now until ongoing double blind trials are completed. 4. Continue namenda XR.  Return in about 6 months (around 07/25/2014).    [name], MD 0/10/7739, 2:87 PM Certified in Neurology, Neurophysiology and Neuroimaging  [facility] [address], [place], [place] [address] [phone]

## 2014-01-22 NOTE — Patient Instructions (Signed)
Continue plegridy.  Try ampyra.  Try biotin 5019mcg daily.

## 2014-01-31 DIAGNOSIS — F0280 Dementia in other diseases classified elsewhere without behavioral disturbance: Secondary | ICD-10-CM | POA: Diagnosis not present

## 2014-02-19 DIAGNOSIS — G35 Multiple sclerosis: Secondary | ICD-10-CM

## 2014-02-19 DIAGNOSIS — F0280 Dementia in other diseases classified elsewhere without behavioral disturbance: Secondary | ICD-10-CM

## 2014-02-21 DIAGNOSIS — G35 Multiple sclerosis: Secondary | ICD-10-CM

## 2014-02-21 DIAGNOSIS — R413 Other amnesia: Secondary | ICD-10-CM

## 2014-02-27 DIAGNOSIS — C44519 Basal cell carcinoma of skin of other part of trunk: Secondary | ICD-10-CM | POA: Diagnosis not present

## 2014-02-27 DIAGNOSIS — D485 Neoplasm of uncertain behavior of skin: Secondary | ICD-10-CM | POA: Diagnosis not present

## 2014-03-07 DIAGNOSIS — G35 Multiple sclerosis: Secondary | ICD-10-CM | POA: Diagnosis not present

## 2014-03-07 DIAGNOSIS — R488 Other symbolic dysfunctions: Secondary | ICD-10-CM | POA: Diagnosis not present

## 2014-03-10 DIAGNOSIS — G35 Multiple sclerosis: Secondary | ICD-10-CM | POA: Diagnosis not present

## 2014-03-10 DIAGNOSIS — R488 Other symbolic dysfunctions: Secondary | ICD-10-CM | POA: Diagnosis not present

## 2014-03-11 DIAGNOSIS — R488 Other symbolic dysfunctions: Secondary | ICD-10-CM | POA: Diagnosis not present

## 2014-03-11 DIAGNOSIS — G35 Multiple sclerosis: Secondary | ICD-10-CM | POA: Diagnosis not present

## 2014-03-12 DIAGNOSIS — G35 Multiple sclerosis: Secondary | ICD-10-CM | POA: Diagnosis not present

## 2014-03-12 DIAGNOSIS — R488 Other symbolic dysfunctions: Secondary | ICD-10-CM | POA: Diagnosis not present

## 2014-03-13 DIAGNOSIS — R488 Other symbolic dysfunctions: Secondary | ICD-10-CM | POA: Diagnosis not present

## 2014-03-13 DIAGNOSIS — G35 Multiple sclerosis: Secondary | ICD-10-CM | POA: Diagnosis not present

## 2014-03-14 DIAGNOSIS — R488 Other symbolic dysfunctions: Secondary | ICD-10-CM | POA: Diagnosis not present

## 2014-03-14 DIAGNOSIS — G35 Multiple sclerosis: Secondary | ICD-10-CM | POA: Diagnosis not present

## 2014-03-17 DIAGNOSIS — H26499 Other secondary cataract, unspecified eye: Secondary | ICD-10-CM | POA: Diagnosis not present

## 2014-03-20 DIAGNOSIS — R488 Other symbolic dysfunctions: Secondary | ICD-10-CM | POA: Diagnosis not present

## 2014-03-20 DIAGNOSIS — G35 Multiple sclerosis: Secondary | ICD-10-CM | POA: Diagnosis not present

## 2014-04-03 DIAGNOSIS — Z85828 Personal history of other malignant neoplasm of skin: Secondary | ICD-10-CM | POA: Diagnosis not present

## 2014-04-03 DIAGNOSIS — L820 Inflamed seborrheic keratosis: Secondary | ICD-10-CM | POA: Diagnosis not present

## 2014-04-03 DIAGNOSIS — L821 Other seborrheic keratosis: Secondary | ICD-10-CM | POA: Diagnosis not present

## 2014-05-13 ENCOUNTER — Other Ambulatory Visit: Payer: Medicare Other | Admitting: Internal Medicine

## 2014-05-13 ENCOUNTER — Encounter: Payer: Self-pay | Admitting: Internal Medicine

## 2014-05-13 ENCOUNTER — Ambulatory Visit (INDEPENDENT_AMBULATORY_CARE_PROVIDER_SITE_OTHER): Payer: Medicare Other | Admitting: Internal Medicine

## 2014-05-13 VITALS — BP 102/64 | HR 76 | Temp 97.7°F | Ht 72.5 in | Wt 190.0 lb

## 2014-05-13 DIAGNOSIS — K219 Gastro-esophageal reflux disease without esophagitis: Secondary | ICD-10-CM

## 2014-05-13 DIAGNOSIS — R413 Other amnesia: Secondary | ICD-10-CM | POA: Diagnosis not present

## 2014-05-13 DIAGNOSIS — E785 Hyperlipidemia, unspecified: Secondary | ICD-10-CM | POA: Diagnosis not present

## 2014-05-13 DIAGNOSIS — Z0000 Encounter for general adult medical examination without abnormal findings: Secondary | ICD-10-CM

## 2014-05-13 DIAGNOSIS — Z125 Encounter for screening for malignant neoplasm of prostate: Secondary | ICD-10-CM | POA: Diagnosis not present

## 2014-05-13 DIAGNOSIS — N319 Neuromuscular dysfunction of bladder, unspecified: Secondary | ICD-10-CM

## 2014-05-13 DIAGNOSIS — G35 Multiple sclerosis: Secondary | ICD-10-CM | POA: Diagnosis not present

## 2014-05-13 DIAGNOSIS — J309 Allergic rhinitis, unspecified: Secondary | ICD-10-CM

## 2014-05-13 DIAGNOSIS — Z13220 Encounter for screening for lipoid disorders: Secondary | ICD-10-CM

## 2014-05-13 DIAGNOSIS — Z130 Encounter for screening for diseases of the blood and blood-forming organs and certain disorders involving the immune mechanism: Secondary | ICD-10-CM | POA: Diagnosis not present

## 2014-05-13 DIAGNOSIS — N400 Enlarged prostate without lower urinary tract symptoms: Secondary | ICD-10-CM

## 2014-05-13 LAB — POCT URINALYSIS DIPSTICK
Bilirubin, UA: NEGATIVE
GLUCOSE UA: NEGATIVE
Ketones, UA: NEGATIVE
Leukocytes, UA: NEGATIVE
NITRITE UA: NEGATIVE
PROTEIN UA: NEGATIVE
RBC UA: NEGATIVE
Spec Grav, UA: 1.015
UROBILINOGEN UA: NEGATIVE
pH, UA: 6

## 2014-05-13 LAB — CBC WITH DIFFERENTIAL/PLATELET
BASOS ABS: 0 10*3/uL (ref 0.0–0.1)
BASOS PCT: 0 % (ref 0–1)
Eosinophils Absolute: 0.1 10*3/uL (ref 0.0–0.7)
Eosinophils Relative: 1 % (ref 0–5)
HCT: 46.5 % (ref 39.0–52.0)
Hemoglobin: 16 g/dL (ref 13.0–17.0)
Lymphocytes Relative: 24 % (ref 12–46)
Lymphs Abs: 1.8 10*3/uL (ref 0.7–4.0)
MCH: 29.8 pg (ref 26.0–34.0)
MCHC: 34.4 g/dL (ref 30.0–36.0)
MCV: 86.6 fL (ref 78.0–100.0)
Monocytes Absolute: 0.5 10*3/uL (ref 0.1–1.0)
Monocytes Relative: 7 % (ref 3–12)
NEUTROS ABS: 5.2 10*3/uL (ref 1.7–7.7)
NEUTROS PCT: 68 % (ref 43–77)
Platelets: 174 10*3/uL (ref 150–400)
RBC: 5.37 MIL/uL (ref 4.22–5.81)
RDW: 15.1 % (ref 11.5–15.5)
WBC: 7.7 10*3/uL (ref 4.0–10.5)

## 2014-05-14 LAB — LIPID PANEL
Cholesterol: 213 mg/dL — ABNORMAL HIGH (ref 0–200)
HDL: 54 mg/dL (ref >39)
LDL CALC: 135 mg/dL — AB (ref 0–99)
Total CHOL/HDL Ratio: 3.9 Ratio
Triglycerides: 120 mg/dL (ref <150)
VLDL: 24 mg/dL (ref 0–40)

## 2014-05-14 LAB — COMPREHENSIVE METABOLIC PANEL
ALBUMIN: 4.4 g/dL (ref 3.5–5.2)
ALK PHOS: 58 U/L (ref 39–117)
ALT: 14 U/L (ref 0–53)
AST: 17 U/L (ref 0–37)
BUN: 18 mg/dL (ref 6–23)
CO2: 24 mEq/L (ref 19–32)
Calcium: 9.6 mg/dL (ref 8.4–10.5)
Chloride: 102 mEq/L (ref 96–112)
Creat: 0.88 mg/dL (ref 0.50–1.35)
Glucose, Bld: 87 mg/dL (ref 70–99)
POTASSIUM: 4 meq/L (ref 3.5–5.3)
SODIUM: 136 meq/L (ref 135–145)
TOTAL PROTEIN: 6.7 g/dL (ref 6.0–8.3)
Total Bilirubin: 0.6 mg/dL (ref 0.2–1.2)

## 2014-05-14 LAB — PSA, MEDICARE: PSA: 4.07 ng/mL — ABNORMAL HIGH (ref <=4.00)

## 2014-06-11 DIAGNOSIS — G35 Multiple sclerosis: Secondary | ICD-10-CM | POA: Diagnosis not present

## 2014-06-11 DIAGNOSIS — N138 Other obstructive and reflux uropathy: Secondary | ICD-10-CM | POA: Diagnosis not present

## 2014-06-11 DIAGNOSIS — N319 Neuromuscular dysfunction of bladder, unspecified: Secondary | ICD-10-CM | POA: Diagnosis not present

## 2014-06-11 DIAGNOSIS — N401 Enlarged prostate with lower urinary tract symptoms: Secondary | ICD-10-CM | POA: Diagnosis not present

## 2014-06-11 DIAGNOSIS — Z125 Encounter for screening for malignant neoplasm of prostate: Secondary | ICD-10-CM | POA: Diagnosis not present

## 2014-07-05 ENCOUNTER — Encounter: Payer: Self-pay | Admitting: Internal Medicine

## 2014-07-05 DIAGNOSIS — R413 Other amnesia: Secondary | ICD-10-CM | POA: Insufficient documentation

## 2014-07-05 NOTE — Patient Instructions (Signed)
Continue same medications and return in one year. Continue followup with neurology and urology

## 2014-07-05 NOTE — Progress Notes (Addendum)
Subjective:    Patient ID: [name], male    DOB: April 07, 1941, 73 y.o.   MRN: [id number]  HPI  73 year old White Male in today for health maintenance and evaluation of medical issues. He has a history of multiple sclerosis and dementia. Diagnosed with multiple sclerosis in 2008. In 2010 he developed transient global amnesia. MRI at that time was unchanged from MRI in 2008. MRI in 2008 showed bilateral white matter findings without cord abnormalities. Neuropsychological testing for memory loss in 2010 showed deficiencies in visual working memory capacity.  He has a history of neurogenic bladder and BPH. He tried Aricept for a while but this caused sleepiness and he discontinued it. He has fallen from time to time due to gait instability. He has worn a right short leg brace. MRI in January 2012 showed a new plaque in the left cerebellum. He also was found to have chronic demyelinating plaque at C2. History of mild COPD.  History of adenomatous colon polyp. History of GE reflux. History of allergic rhinitis. Infectious mononucleosis 1963. Infection from urinary obstruction 1957. Urethral obstruction 1970.  Right arthroscopic acromioplasty distal clavicle excision and debridement September 2005. Herniorrhaphy in 1983 , 1988, 1997. Right inguinal hernia repair September 2012.  Had colonoscopy March 2007. History of adenomatous colon polyp. Dr. Earle Gell is his GI physician.  Patient had Cardiolite study in 2006 which was normal and interpreted Dr. Stanford Breed.  Social history: This is his second marriage. He smoked from approximately 4098-1191 1-1/2 packs daily. Does not consume alcohol. No children.  Family history: Father died at age 60 with Alzheimer's disease. Mother died of complications of depression at age 70. One sister in good health.    Review of Systems  Constitutional: Positive for fatigue.  HENT: Negative.   Respiratory: Negative.   Cardiovascular: Negative.   Endocrine:  Negative.   Genitourinary:       Neurogenic bladder and BPH  Neurological:       Generalized weakness and gait instability  Psychiatric/Behavioral:       Significant memory loss       Objective:   Physical Exam  Vitals reviewed. Constitutional: He appears well-developed and well-nourished. No distress.  HENT:  Head: Normocephalic and atraumatic.  Right Ear: External ear normal.  Left Ear: External ear normal.  Mouth/Throat: Oropharynx is clear and moist. No oropharyngeal exudate.  Eyes: Conjunctivae and EOM are normal. Pupils are equal, round, and reactive to light. Right eye exhibits no discharge. Left eye exhibits no discharge. No scleral icterus.  Neck: Neck supple. No JVD present. No thyromegaly present.  Cardiovascular: Normal rate, regular rhythm, normal heart sounds and intact distal pulses.   No murmur heard. Pulmonary/Chest: Effort normal and breath sounds normal. No respiratory distress. He has no wheezes. He has no rales.  Abdominal: Soft. Bowel sounds are normal. He exhibits no distension and no mass. There is no tenderness. There is no rebound and no guarding.  Genitourinary:  Deferred to urologist  Musculoskeletal: Normal range of motion. He exhibits no edema.  Lymphadenopathy:    He has no cervical adenopathy.  Neurological: He is alert. No cranial nerve deficit. Coordination abnormal.  He is alert but clearly has issues with short-term memory  Skin: Skin is warm and dry. He is not diaphoretic.  Psychiatric: He has a normal mood and affect. His behavior is normal.          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 Wife puts him in respite care at [facility] when she goes out of town. Continue to be followed by Neurology and  Urology  Subjective:   Patient presents for Medicare Annual/Subsequent preventive examination.  Review Past Medical/Family/Social:   Risk Factors  Current exercise habits:  Dietary issues discussed:   Cardiac risk factors:  Depression Screen  (Note: if answer to either of the following is "Yes", a more complete depression screening is indicated)   Over the past two weeks, have you felt down, depressed or hopeless? Sometimes- frustrtated due to urinary issues Over the past two weeks, have you felt little interest or pleasure in doing things? No Have you lost interest or pleasure in daily life? No Do you often feel hopeless? No Do you cry easily over simple problems? No   Activities of Daily Living  In your present state of health, do you have any difficulty performing the following activities?:   Driving? No longer drives Managing money? Looks at his stocks but cannot manage them or money. Feeding yourself? No  Getting from bed to chair? yes Climbing a flight of stairs? yes Preparing food and eating?: No  Bathing or showering? No  Getting dressed: No  Getting to the toilet? yes Using the toilet:No  Moving around from place to place: yes In the past year have you fallen or had a near fall?:yes Are you sexually active? No  Do you have more than one partner? No   Hearing Difficulties: No  Do you often ask people to speak up or repeat themselves? No  Do you experience ringing or noises in your ears? No  Do you have difficulty understanding soft or whispered voices? No  Do you feel that you have a problem with memory?yes Do you often misplace items? yes   Home Safety:  Do you have a smoke alarm at your residence? Yes Do you have grab bars in the bathroom? yes Do you have throw rugs in your house? no   Cognitive Testing  Alert? Yes Normal Appearance?Yes  Oriented to person? Yes Place? Yes  Time? no Recall of three objects? no Can perform simple calculations?  no Displays appropriate judgment?Yes  Can read the correct time from a watch face?Yes   List the Names of Other Physician/Practitioners you currently use:  See referral list for the physicians patient is currently seeing.  Dr. Amalia Hailey, urology Neurology   Review of Systems: See above   Objective:     General appearance: Appears stated age  Head: Normocephalic, without obvious abnormality, atraumatic  Eyes: conj clear, EOMi PEERLA  Ears: normal TM's and external ear canals both ears  Nose: Nares normal. Septum midline. Mucosa normal. No drainage or sinus tenderness.  Throat: lips, mucosa, and tongue normal; teeth and gums normal  Neck: no adenopathy, no carotid bruit, no JVD, supple, symmetrical, trachea midline and thyroid not enlarged, symmetric, no tenderness/mass/nodules  No CVA tenderness.  Lungs: clear to auscultation bilaterally  Heart: regular rate and rhythm, S1, S2 normal, no murmur, click, rub or gallop  Abdomen: soft, non-tender; bowel sounds normal; no masses, no organomegaly  Musculoskeletal: ROM normal in all joints, no crepitus, no deformity, Normal muscle strengthen. Back  is symmetric, no curvature. Skin: Skin color, texture, turgor normal. No rashes or lesions  Lymph nodes: Cervical,  supraclavicular, and axillary nodes normal.  Neurologic: CN 2 -12 Normal, Normal symmetric reflexes. Normal coordination and gait  Psych: Alert & Oriented x 3, Mood appear stable.    Assessment:    Annual wellness medicare exam   Plan:    During the course of the visit the patient was educated and counseled about appropriate screening and preventive services including:   Annual PSA  Annual flu vaccine     Patient Instructions (the written plan) was given to the patient.  Medicare Attestation  I have personally reviewed:  The patient's medical and social history  Their use of alcohol, tobacco or illicit drugs  Their current medications and supplements  The patient's  functional ability including ADLs,fall risks, home safety risks, cognitive, and hearing and visual impairment  Diet and physical activities  Evidence for depression or mood disorders  The patient's weight, height, BMI, and visual acuity have been recorded in the chart. I have made referrals, counseling, and provided education to the patient based on review of the above and I have provided the patient with a written personalized care plan for preventive services.   Patient has gait disorder and semi electric hospital bed would benefit him. He requires frequent reposition in ways that cannot be achieved in an ordinary bed to reduce pain, pressure and treat existing pressure sores. Patient also needs his head elevated to help with swallowing and other breathing issues.

## 2014-07-14 ENCOUNTER — Other Ambulatory Visit: Payer: Self-pay | Admitting: Internal Medicine

## 2014-07-14 DIAGNOSIS — G35 Multiple sclerosis: Secondary | ICD-10-CM

## 2014-07-14 DIAGNOSIS — Z111 Encounter for screening for respiratory tuberculosis: Secondary | ICD-10-CM

## 2014-07-14 DIAGNOSIS — L820 Inflamed seborrheic keratosis: Secondary | ICD-10-CM | POA: Diagnosis not present

## 2014-07-14 DIAGNOSIS — Z85828 Personal history of other malignant neoplasm of skin: Secondary | ICD-10-CM | POA: Diagnosis not present

## 2014-07-14 DIAGNOSIS — D485 Neoplasm of uncertain behavior of skin: Secondary | ICD-10-CM | POA: Diagnosis not present

## 2014-07-14 DIAGNOSIS — L821 Other seborrheic keratosis: Secondary | ICD-10-CM | POA: Diagnosis not present

## 2014-07-14 DIAGNOSIS — L218 Other seborrheic dermatitis: Secondary | ICD-10-CM | POA: Diagnosis not present

## 2014-07-14 DIAGNOSIS — R269 Unspecified abnormalities of gait and mobility: Secondary | ICD-10-CM

## 2014-07-14 NOTE — Progress Notes (Signed)
Orders for [facility] assisted [facility] done and faxed.

## 2014-07-16 ENCOUNTER — Other Ambulatory Visit: Payer: Self-pay | Admitting: Internal Medicine

## 2014-07-16 DIAGNOSIS — G35 Multiple sclerosis: Secondary | ICD-10-CM

## 2014-07-16 DIAGNOSIS — R269 Unspecified abnormalities of gait and mobility: Secondary | ICD-10-CM

## 2014-07-24 ENCOUNTER — Encounter: Payer: Self-pay | Admitting: Diagnostic Neuroimaging

## 2014-07-24 ENCOUNTER — Ambulatory Visit (INDEPENDENT_AMBULATORY_CARE_PROVIDER_SITE_OTHER): Payer: Medicare Other | Admitting: Diagnostic Neuroimaging

## 2014-07-24 VITALS — BP 105/71 | HR 79 | Temp 97.8°F

## 2014-07-24 DIAGNOSIS — G35 Multiple sclerosis: Secondary | ICD-10-CM

## 2014-07-24 DIAGNOSIS — R413 Other amnesia: Secondary | ICD-10-CM | POA: Diagnosis not present

## 2014-07-24 NOTE — Progress Notes (Signed)
[facility]  PATIENT: [name] DOB: 03-28-1941  REFERRING CLINICIAN:  HISTORY FROM: patient and wife  REASON FOR VISIT: follow up   HISTORICAL  CHIEF COMPLAINT:  Chief Complaint  Patient presents with  . Follow-up    MS    HISTORY OF PRESENT ILLNESS:   UPDATE 07/24/14: Planning to transition to [facility] week. Continued progression of gait diff, more weakness in right leg. Still with short term memory loss. Now with more urinary urgency.   UPDATE 01/22/14: Since last visit, transitioned avonex to plegridy. Also saw Dr. George Hugh [facility]/MS) in order to get referral to neuropsych cognitive therapy. Pt was rx'd amprya, but not yet started Herbalist). Overall, continued slow steady progression of symptoms.  UPDATE 07/25/13: Since last visit, continued progression of memory decline, gait difficulty. [name] recently at night, possibly related to [place] usage. Also is readying for transition to [facility] in future; has non-resident resident status.   UPDATE 01/22/13: since last visit patient was hospitalized for upper respiratory infection and fever for 3 days. Patient was discharged 1 week ago. Patient had some exacerbation of MS symptoms. MRI of the brain and cervical spine showed chronic MS plaques with no acute plaques.   PRIOR HPI (10/25/12, Dr. Erling Rogers): 73 year old right-handed white married male with a ten-year history of progressive right arm and leg weakness without hyperreflexia and with atrophy of his right arm and right leg. Initial evaluation with EMG/NCV and blood studies for neuropathy were unremarkable. Brain MRI 8/08 and 08/11/2007 and cervical MRI 10/21/2006 showed bilateral white matter findings without cord abnormalities.VER was abnormal on the left with delay and BAER was normal. CSF 10/17/2007 showed elevated IgG index and 12 bands. He was started on [name] in March 2009. MRI studies of the brain and cervical spine 11/28/08 and  11/29/08 one year later showed no change. 05/21/09 he developed transient global amnesia after intercourse and had an MRI study of the brain and intracrainal MRA at the [name] emergency room which was unchanged. Neuropsychological testing for memory loss 07/06/09 showed deficiencies in visual working memory capacity, consistentcy of acquisition and retention of visual information, speed of visual sequencing and procedural learning. 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 He was on Aricept for a while but this caused sleeplessness and he discontinued it. He had one fall while at [name] when he got up at night. 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 He is no longer playing golf. He is not using his walking sticks, a cane or his short leg braces, a NESS 400 or AFO. He was on ampyra for one month and found benefit, noted by the patient and his Physiological scientist. He also believes he has improvement in memory. Ampyra was discontinued because of increasing prostate pain. He denies any bowel or bladder dysfunction. He takes 5 mg of amitriptyline at night to help with bladder control, but is discontinuing it. He frequently sits on a pillow because of pain. He saw Dr. Alona Rogers 10/26/2011 for cystoscopy and cystometrogram, consistent with MS bladder. He is independent in his activities of daily living. CBC and CMP 06/11/12 were normal. MRI studies of the brain and cervical spine 10/05/2010 showed chronic demyelinating plaques and a new plaque in the left cerebellum versus 11/28/08. He had a chronic demyelinating plaque at C2 without change versus 11/29/08.  He has fatigue and takes a 15-20 minute unscheduled nap during the day. He has nocturia once or twice per night .He underwent his fourth hernia operation 06/16/11. He had cataract surgery on the left 01/2012 and right 2013.Marland Kitchen He notes  progression of right hand and arm weakness with difficulty in his handwriting.He is not using his left hand to feed himself, but he notices an awkward position with the fork in his right hand. He is driving limited distances. He tried taking Tecfidera but developed severe nausea and vomiting after going to 240 twice a day. He is on Rapaflow with better control of his bladder. His wife has noted memory problems.He has missed appointments. His father had Alzheimer's disease. 10/25/2012=(MMSE27/30. Clock drawing task 3/4. Animal fluency test 14. Ron Parker index of independence in activities of daily living 6. Bubba Camp instrumental activity of daily living scale 7.Neuropsychiatric inventory=0. Geriatric depression scale 3/15. Falls assessment tool score7. CNS-LS 8).   REVIEW OF SYSTEMS: Full 14 system review of systems performed and notable only for memory loss difficulty urinating prostate difficulty balance walking difficulty.  ALLERGIES: No Known Allergies  HOME MEDICATIONS: Outpatient Prescriptions Prior to Visit  Medication Sig Dispense Refill  . Ascorbic Acid (VITAMIN C PO) Take by mouth daily.      . Cholecalciferol (VITAMIN D PO) Take by mouth daily.      Marland Kitchen docusate sodium (COLACE) 100 MG capsule Take 100 mg by mouth daily.    . Memantine HCl ER (NAMENDA XR) 28 MG CP24 Take 1 capsule by mouth daily.    . Omega-3 Fatty Acids (FISH OIL PO) Take 100 mg by mouth daily.    . saw palmetto 160 MG capsule Take 160 mg by mouth 2 (two) times daily.    . silodosin (RAPAFLO) 8 MG CAPS capsule Take 8 mg by mouth daily with breakfast.    . zolpidem (AMBIEN) 10 MG tablet Take 10 mg by mouth at bedtime.    Marland Kitchen [name] 125 MCG/0.5ML SOPN Inject 125 mg as directed every 14 (fourteen) days.     No facility-administered medications prior to visit.    PAST MEDICAL HISTORY: Past Medical History  Diagnosis Date  . Multiple sclerosis   . BPH (benign prostatic hyperplasia)   . Prostatitis, chronic   . Allergy    . GERD (gastroesophageal reflux disease)   . COPD (chronic obstructive pulmonary disease)   . Adenomatous polyp   . Neuromuscular disorder     MS  . Multiple sclerosis     PAST SURGICAL HISTORY: Past Surgical History  Procedure Laterality Date  . Hernia repair  [id number]    3 inguinal hernia repairs on left  . Hernia repair  06/16/11    right inguinal hernia repair with mesh   . Prostate surgery      FAMILY HISTORY: Family History  Problem Relation Age of Onset  . Depression Mother   . Dementia Father   . Pneumonia Father     SOCIAL HISTORY:  History   Social History  . Marital Status: Married    Spouse Name: Peggy    Number of Children: 0  . Years of Education: Production manager   Occupational History  . Retired    Social History Main Topics  . Smoking status: Former Smoker    Quit date: 09/20/1975  . Smokeless tobacco: Never Used  . Alcohol Use: No     Comment: Quit: 2003  . Drug Use: No  . Sexual Activity: No   Other Topics  Concern  . Not on file   Social History Narrative   Pt lives at home with his spouse.  Patient will be moving to [facility] next week. 07/24/2014    Caffeine: Quit in 2003     PHYSICAL EXAM  Filed Vitals:   07/24/14 1408  BP: 105/71  Pulse: 79  Temp: 97.8 F (36.6 C)  TempSrc: Oral    Not recorded      Cannot calculate BMI with a height equal to zero.  GENERAL EXAM:  Patient is in no distress; ELEVATED MOOD / EUPHORIA.   CARDIOVASCULAR:  Regular rate and rhythm, no murmurs, no carotid bruits   NEUROLOGIC:  MENTAL STATUS: awake, alert, language fluent, comprehension intact, naming intact; MMSE 25/30.  CRANIAL NERVE: pupils equal and reactive to light, visual fields full to confrontation, extraocular muscles intact, SACCADIC BREAKDOWN OF SMOOTH PURSUIT. facial sensation and strength symmetric, uvula midline, shoulder shrug symmetric, tongue midline.  MOTOR: INCREASED TONE IN RUE AND RLE; RIGHT TRICEPS 4, GRIP 4. RIGHT  HF 2. RIGHT DF 2 (WITH AFO).  SENSORY: normal and symmetric to light touch  COORDINATION: finger-nose-finger, fine finger movements SLOW IN RUE  GAIT/STATION: IN WHEELCHAIR.     DIAGNOSTIC DATA (LABS, IMAGING, TESTING) - I reviewed patient records, labs, notes, testing and imaging myself where available.  Lab Results  Component Value Date   WBC 7.7 05/13/2014   HGB 16.0 05/13/2014   HCT 46.5 05/13/2014   MCV 86.6 05/13/2014   PLT 174 05/13/2014      Component Value Date/Time   NA 136 05/13/2014 0924   K 4.0 05/13/2014 0924   CL 102 05/13/2014 0924   CO2 24 05/13/2014 0924   GLUCOSE 87 05/13/2014 0924   BUN 18 05/13/2014 0924   CREATININE 0.88 05/13/2014 0924   CREATININE 0.85 01/15/2013 0519   CALCIUM 9.6 05/13/2014 0924   PROT 6.7 05/13/2014 0924   ALBUMIN 4.4 05/13/2014 0924   AST 17 05/13/2014 0924   ALT 14 05/13/2014 0924   ALKPHOS 58 05/13/2014 0924   BILITOT 0.6 05/13/2014 0924   GFRNONAA 85* 01/15/2013 0519   GFRAA >90 01/15/2013 0519   Lab Results  Component Value Date   CHOL 213* 05/13/2014   HDL 54 05/13/2014   LDLCALC 135* 05/13/2014   TRIG 120 05/13/2014   CHOLHDL 3.9 05/13/2014   Lab Results  Component Value Date   HGBA1C 5.7* 01/13/2013   Lab Results  Component Value Date   VITAMINB12 679 01/21/2013   Lab Results  Component Value Date   TSH 0.484 01/13/2013    I reviewed images myself and agree with interpretation. -VRP  01/13/13 MRI brain (w/wo) - Moderate to advanced atrophy. Moderate to advanced chronic white matter changes similar to 2012. This may be due to multiple sclerosis and/or chronic microvascular ischemia. There is no restricted diffusion or abnormal enhancement. No change from the prior study.   01/13/13 MRI cervical (w/wo) - Subtle areas of signal abnormality at C2 on the right and C5-6 are unchanged from prior study. No enhancing lesions. Cervical spondylosis without spinal stenosis.     ASSESSMENT AND PLAN  73 y.o.  year old male here with multiple sclerosis (? primary progressive vs. secondary progressive). Also with progressive short term memory loss and cognitive decline. Transitioning to [facility] next week.  PLAN:  1. Stop plegridy (due to ineffectiveness) 2. Continue namenda XR.  Return in about 6 months (around 01/22/2015).    [name], MD 07/24/2014, 2:33  PM Certified in Neurology, Neurophysiology and [facility] [address] [place], [place] [address] [phone]

## 2014-07-24 NOTE — Patient Instructions (Signed)
Stop plegridy.  Continue namenda.

## 2014-07-29 ENCOUNTER — Encounter: Payer: Self-pay | Admitting: Internal Medicine

## 2014-07-31 ENCOUNTER — Encounter: Payer: Self-pay | Admitting: Internal Medicine

## 2014-07-31 ENCOUNTER — Other Ambulatory Visit: Payer: Self-pay | Admitting: Internal Medicine

## 2014-07-31 DIAGNOSIS — R260 Ataxic gait: Secondary | ICD-10-CM | POA: Diagnosis not present

## 2014-07-31 DIAGNOSIS — G8191 Hemiplegia, unspecified affecting right dominant side: Secondary | ICD-10-CM | POA: Diagnosis not present

## 2014-07-31 DIAGNOSIS — R488 Other symbolic dysfunctions: Secondary | ICD-10-CM | POA: Diagnosis not present

## 2014-07-31 DIAGNOSIS — G3281 Cerebellar ataxia in diseases classified elsewhere: Secondary | ICD-10-CM | POA: Diagnosis not present

## 2014-07-31 DIAGNOSIS — E785 Hyperlipidemia, unspecified: Secondary | ICD-10-CM

## 2014-07-31 DIAGNOSIS — R972 Elevated prostate specific antigen [PSA]: Secondary | ICD-10-CM

## 2014-07-31 DIAGNOSIS — R270 Ataxia, unspecified: Secondary | ICD-10-CM | POA: Diagnosis not present

## 2014-07-31 DIAGNOSIS — R296 Repeated falls: Secondary | ICD-10-CM | POA: Diagnosis not present

## 2014-07-31 DIAGNOSIS — M6281 Muscle weakness (generalized): Secondary | ICD-10-CM | POA: Diagnosis not present

## 2014-07-31 DIAGNOSIS — R269 Unspecified abnormalities of gait and mobility: Secondary | ICD-10-CM | POA: Diagnosis not present

## 2014-07-31 DIAGNOSIS — G35 Multiple sclerosis: Secondary | ICD-10-CM | POA: Diagnosis not present

## 2014-07-31 DIAGNOSIS — R5383 Other fatigue: Secondary | ICD-10-CM | POA: Diagnosis not present

## 2014-07-31 DIAGNOSIS — R413 Other amnesia: Secondary | ICD-10-CM | POA: Diagnosis not present

## 2014-07-31 DIAGNOSIS — R279 Unspecified lack of coordination: Secondary | ICD-10-CM | POA: Diagnosis not present

## 2014-07-31 DIAGNOSIS — F0280 Dementia in other diseases classified elsewhere without behavioral disturbance: Secondary | ICD-10-CM | POA: Diagnosis not present

## 2014-07-31 HISTORY — DX: Hyperlipidemia, unspecified: E78.5

## 2014-07-31 HISTORY — DX: Elevated prostate specific antigen (PSA): R97.20

## 2014-08-01 DIAGNOSIS — R269 Unspecified abnormalities of gait and mobility: Secondary | ICD-10-CM | POA: Diagnosis not present

## 2014-08-01 DIAGNOSIS — R270 Ataxia, unspecified: Secondary | ICD-10-CM | POA: Diagnosis not present

## 2014-08-01 DIAGNOSIS — R260 Ataxic gait: Secondary | ICD-10-CM | POA: Diagnosis not present

## 2014-08-01 DIAGNOSIS — R279 Unspecified lack of coordination: Secondary | ICD-10-CM | POA: Diagnosis not present

## 2014-08-01 DIAGNOSIS — M6281 Muscle weakness (generalized): Secondary | ICD-10-CM | POA: Diagnosis not present

## 2014-08-01 DIAGNOSIS — R488 Other symbolic dysfunctions: Secondary | ICD-10-CM | POA: Diagnosis not present

## 2014-08-04 DIAGNOSIS — R279 Unspecified lack of coordination: Secondary | ICD-10-CM | POA: Diagnosis not present

## 2014-08-04 DIAGNOSIS — R260 Ataxic gait: Secondary | ICD-10-CM | POA: Diagnosis not present

## 2014-08-04 DIAGNOSIS — M6281 Muscle weakness (generalized): Secondary | ICD-10-CM | POA: Diagnosis not present

## 2014-08-04 DIAGNOSIS — R269 Unspecified abnormalities of gait and mobility: Secondary | ICD-10-CM | POA: Diagnosis not present

## 2014-08-04 DIAGNOSIS — R488 Other symbolic dysfunctions: Secondary | ICD-10-CM | POA: Diagnosis not present

## 2014-08-04 DIAGNOSIS — R270 Ataxia, unspecified: Secondary | ICD-10-CM | POA: Diagnosis not present

## 2014-08-06 ENCOUNTER — Encounter: Payer: Self-pay | Admitting: Nurse Practitioner

## 2014-08-06 DIAGNOSIS — R270 Ataxia, unspecified: Secondary | ICD-10-CM | POA: Diagnosis not present

## 2014-08-06 DIAGNOSIS — M6281 Muscle weakness (generalized): Secondary | ICD-10-CM | POA: Diagnosis not present

## 2014-08-06 DIAGNOSIS — R260 Ataxic gait: Secondary | ICD-10-CM | POA: Diagnosis not present

## 2014-08-06 DIAGNOSIS — R488 Other symbolic dysfunctions: Secondary | ICD-10-CM | POA: Diagnosis not present

## 2014-08-06 DIAGNOSIS — R269 Unspecified abnormalities of gait and mobility: Secondary | ICD-10-CM | POA: Diagnosis not present

## 2014-08-06 DIAGNOSIS — R279 Unspecified lack of coordination: Secondary | ICD-10-CM | POA: Diagnosis not present

## 2014-08-07 DIAGNOSIS — M6281 Muscle weakness (generalized): Secondary | ICD-10-CM | POA: Diagnosis not present

## 2014-08-07 DIAGNOSIS — R279 Unspecified lack of coordination: Secondary | ICD-10-CM | POA: Diagnosis not present

## 2014-08-07 DIAGNOSIS — R270 Ataxia, unspecified: Secondary | ICD-10-CM | POA: Diagnosis not present

## 2014-08-07 DIAGNOSIS — R488 Other symbolic dysfunctions: Secondary | ICD-10-CM | POA: Diagnosis not present

## 2014-08-07 DIAGNOSIS — R260 Ataxic gait: Secondary | ICD-10-CM | POA: Diagnosis not present

## 2014-08-07 DIAGNOSIS — R269 Unspecified abnormalities of gait and mobility: Secondary | ICD-10-CM | POA: Diagnosis not present

## 2014-08-08 ENCOUNTER — Ambulatory Visit: Payer: Medicare Other

## 2014-08-08 DIAGNOSIS — R260 Ataxic gait: Secondary | ICD-10-CM | POA: Diagnosis not present

## 2014-08-08 DIAGNOSIS — R269 Unspecified abnormalities of gait and mobility: Secondary | ICD-10-CM | POA: Diagnosis not present

## 2014-08-08 DIAGNOSIS — R270 Ataxia, unspecified: Secondary | ICD-10-CM | POA: Diagnosis not present

## 2014-08-08 DIAGNOSIS — R279 Unspecified lack of coordination: Secondary | ICD-10-CM | POA: Diagnosis not present

## 2014-08-08 DIAGNOSIS — M6281 Muscle weakness (generalized): Secondary | ICD-10-CM | POA: Diagnosis not present

## 2014-08-08 DIAGNOSIS — R488 Other symbolic dysfunctions: Secondary | ICD-10-CM | POA: Diagnosis not present

## 2014-08-11 ENCOUNTER — Non-Acute Institutional Stay: Payer: Medicare Other | Admitting: Internal Medicine

## 2014-08-11 DIAGNOSIS — R531 Weakness: Secondary | ICD-10-CM

## 2014-08-11 DIAGNOSIS — R279 Unspecified lack of coordination: Secondary | ICD-10-CM | POA: Diagnosis not present

## 2014-08-11 DIAGNOSIS — G35 Multiple sclerosis: Secondary | ICD-10-CM

## 2014-08-11 DIAGNOSIS — N319 Neuromuscular dysfunction of bladder, unspecified: Secondary | ICD-10-CM | POA: Diagnosis not present

## 2014-08-11 DIAGNOSIS — R269 Unspecified abnormalities of gait and mobility: Secondary | ICD-10-CM | POA: Diagnosis not present

## 2014-08-11 DIAGNOSIS — E785 Hyperlipidemia, unspecified: Secondary | ICD-10-CM | POA: Diagnosis not present

## 2014-08-11 DIAGNOSIS — R488 Other symbolic dysfunctions: Secondary | ICD-10-CM | POA: Diagnosis not present

## 2014-08-11 DIAGNOSIS — N400 Enlarged prostate without lower urinary tract symptoms: Secondary | ICD-10-CM

## 2014-08-11 DIAGNOSIS — R270 Ataxia, unspecified: Secondary | ICD-10-CM | POA: Diagnosis not present

## 2014-08-11 DIAGNOSIS — M6281 Muscle weakness (generalized): Secondary | ICD-10-CM | POA: Diagnosis not present

## 2014-08-11 DIAGNOSIS — R413 Other amnesia: Secondary | ICD-10-CM

## 2014-08-11 DIAGNOSIS — R260 Ataxic gait: Secondary | ICD-10-CM | POA: Diagnosis not present

## 2014-08-12 DIAGNOSIS — R279 Unspecified lack of coordination: Secondary | ICD-10-CM | POA: Diagnosis not present

## 2014-08-12 DIAGNOSIS — R269 Unspecified abnormalities of gait and mobility: Secondary | ICD-10-CM | POA: Diagnosis not present

## 2014-08-12 DIAGNOSIS — M6281 Muscle weakness (generalized): Secondary | ICD-10-CM | POA: Diagnosis not present

## 2014-08-12 DIAGNOSIS — R270 Ataxia, unspecified: Secondary | ICD-10-CM | POA: Diagnosis not present

## 2014-08-12 DIAGNOSIS — R488 Other symbolic dysfunctions: Secondary | ICD-10-CM | POA: Diagnosis not present

## 2014-08-12 DIAGNOSIS — R260 Ataxic gait: Secondary | ICD-10-CM | POA: Diagnosis not present

## 2014-08-13 DIAGNOSIS — M6281 Muscle weakness (generalized): Secondary | ICD-10-CM | POA: Diagnosis not present

## 2014-08-13 DIAGNOSIS — R488 Other symbolic dysfunctions: Secondary | ICD-10-CM | POA: Diagnosis not present

## 2014-08-13 DIAGNOSIS — R270 Ataxia, unspecified: Secondary | ICD-10-CM | POA: Diagnosis not present

## 2014-08-13 DIAGNOSIS — R269 Unspecified abnormalities of gait and mobility: Secondary | ICD-10-CM | POA: Diagnosis not present

## 2014-08-13 DIAGNOSIS — R279 Unspecified lack of coordination: Secondary | ICD-10-CM | POA: Diagnosis not present

## 2014-08-13 DIAGNOSIS — R260 Ataxic gait: Secondary | ICD-10-CM | POA: Diagnosis not present

## 2014-08-15 DIAGNOSIS — R279 Unspecified lack of coordination: Secondary | ICD-10-CM | POA: Diagnosis not present

## 2014-08-15 DIAGNOSIS — R270 Ataxia, unspecified: Secondary | ICD-10-CM | POA: Diagnosis not present

## 2014-08-15 DIAGNOSIS — R269 Unspecified abnormalities of gait and mobility: Secondary | ICD-10-CM | POA: Diagnosis not present

## 2014-08-15 DIAGNOSIS — R488 Other symbolic dysfunctions: Secondary | ICD-10-CM | POA: Diagnosis not present

## 2014-08-15 DIAGNOSIS — R260 Ataxic gait: Secondary | ICD-10-CM | POA: Diagnosis not present

## 2014-08-15 DIAGNOSIS — M6281 Muscle weakness (generalized): Secondary | ICD-10-CM | POA: Diagnosis not present

## 2014-08-17 ENCOUNTER — Encounter: Payer: Self-pay | Admitting: Internal Medicine

## 2014-08-17 NOTE — Progress Notes (Signed)
Patient ID: [name], male   DOB: 1941/02/05, 73 y.o.   MRN: [id number]    HISTORY AND PHYSICAL  Location:  [place] Room Number: 409 Place of Service: Clinic (12)   Extended Emergency Contact Information Primary Emergency Contact: Donaghey,Peggy Address: [address]          [place],  [address] [place] Phone: [phone] Mobile Phone: [phone] Relation: Spouse  Advanced Directive information Does patient have an advance directive?: Yes, Type of Advance Directive: Living will, Does patient want to make changes to advanced directive?: No - Patient declined  Chief Complaint  Patient presents with  . Medical Management of Chronic Issues    New Patient- moved to [place] 07/29/14    HPI:  Multiple sclerosis 73 year old right-handed white married male with history since about 2000 of progressive right arm and leg weakness without hyperreflexia and with atrophy of his right arm and right leg. Initial evaluation with EMG/NCV and blood studies for neuropathy were unremarkable. Brain MRI 8/08 and 08/11/2007 and cervical MRI 10/21/2006 showed bilateral white matter findings without cord abnormalities.VER was abnormal on the left with delay and BAER was normal. CSF 10/17/2007 showed elevated IgG index and 12 bands. He was started on [name] in March 2009. MRI studies of the brain and cervical spine 11/28/08 and 11/29/08 one year later showed no change.  On 05/21/09 he developed transient global amnesia after intercourse and had an MRI study of the brain and intracranial MRA at the [facility] emergency room which was unchanged.  He had one fall while at [facility] when he got up at night. 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 He is no longer playing golf. He is using AFO and a 4 wheel walker.  He was on ampyra for one month and found benefit, noted by the  patient and his Physiological scientist. He also believes he has improvement in memory.  Ampyra was discontinued because of increasing prostate pain.  CBC and CMP 06/11/12 were normal. MRI studies of the brain and cervical spine 10/05/2010 showed chronic demyelinating plaques and a new plaque in the left cerebellum versus 11/28/08. He had a chronic demyelinating plaque at C2 without change versus 11/29/08.  He has fatigue and takes a 15-20 minute unscheduled nap during the day.  He has nocturia once or twice per night . He notes progression of right hand and arm weakness with difficulty in his handwriting.He is not using his left hand to feed himself, but he notices an awkward position with the fork in his right hand.  He tried taking Tecfidera but developed severe nausea and vomiting after going to 240 twice a day.     Neurogenic bladder  He took 5 mg of amitriptyline at night to help with bladder control, but  discontinued it. He frequently sits on a pillow because of pain. He saw Dr. Alona Bene 10/26/2011 for cystoscopy and cystometrogram, consistent with MS bladder.   He is on Rapaflow with better control of his bladder.   Memory loss due to medical condition Neuropsychological testing for memory loss 07/06/09 showed deficiencies in visual working memory capacity, consistentcy of acquisition and retention of visual information, speed of visual sequencing and procedural learning. 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 He was on Aricept for a while but  this caused sleeplessness and he discontinued it.   His wife has noted memory problems.He has missed appointments. His father had Alzheimer's disease. 10/25/2012=(MMSE27/30. Clock drawing task 3/4. Animal fluency test 14. Katz index of independence in activities of daily living 6. Lawton- Brody instrumental activity of daily living scale 7.Neuropsychiatric inventory=0. Geriatric depression scale  3/15. Falls assessment tool score7. CNS-LS 8).   Generalized weakness Weakness is progressive. He previously had been relatively self-sufficient and even played golf but these were all activities of the past now. He has stopped driving. He has graduated to use of a 4 wheel walker with brakes and seat.  Gait disorder  using 4 walker with brakes and seat  Hyperlipidemia  Continues routine management and follow-up . Not currently on any medications.     Past Medical History  Diagnosis Date  . Multiple sclerosis 2008  . BPH (benign prostatic hyperplasia)   . Prostatitis, chronic   . Allergy   . GERD (gastroesophageal reflux disease)   . COPD (chronic obstructive pulmonary disease)   . Adenomatous polyp   . Hyperlipidemia 07/31/2014  . Generalized weakness 01/12/2013  . 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 Elevated PSA 07/31/2014  . Foot drop, right 2008  . Dementia 2010    Past Surgical History  Procedure Laterality Date  . Hernia repair  1980'2000    3 inguinal hernia repairs on left  . Hernia repair  06/16/11    right inguinal hernia repair with mesh   . Prostate surgery  09/29/2008    Dr. Ottelin   . Esophagogastroduodenoscopy endoscopy  04/16/2002    Dr. Martin Johnson  . Cataract extraction Left 02/06/12  . Cataract extraction Right 2013    Patient Care Team: Arthur G Green, MD as PCP - General (Internal Medicine) Vikram R Penumalli, MD as Consulting Physician (Neurology) Robert J Evans, MD as Consulting Physician (Urology) Todd Rosenbower, MD as Consulting Physician (General Surgery) W D Caffrey Jr., MD as Consulting Physician (Orthopedic Surgery) Haywood Ingram, MD as Consulting Physician (General Surgery) Martin K Johnson, MD as Consulting Physician (Gastroenterology)  History   Social History  . Marital Status: Married    Spouse Name: Peggy    Number of Children: 0  . Years of Education: Law School   Occupational History  . Retired lawyer    Social History Main  Topics  . Smoking status: Former Smoker    Quit date: 09/20/1975  . Smokeless tobacco: Never Used  . Alcohol Use: No     Comment: Quit: 2003  . Drug Use: No  . Sexual Activity: No   Other Topics Concern  . Not on file   Social History Narrative   Pt lives at home with his spouse.  Patient will be moving to [place] next week. 07/24/2014    Caffeine: Quit in 2003   Stopped smoking 1977   Exercise none        reports that he quit smoking about 38 years ago. He has never used smokeless tobacco. He reports that he does not drink alcohol or use illicit drugs.  Family History  Problem Relation Age of Onset  . Depression Mother   . Dementia Father   . Pneumonia Father    Family Status  Relation Status Death Age  . Mother Deceased 82    unknown  . Father Deceased 95  . Sister Alive   . Brother Alive   . Daughter Alive   . Son Alive   . Daughter Alive   .   Son Alive   . Son Alive     Immunization History  Administered Date(s) Administered  . Pneumococcal Polysaccharide-23 10/23/2006  . Tdap 04/19/2005    Allergies  Allergen Reactions  . Aricept [Donepezil Hcl]     Medications: Patient's Medications  New Prescriptions   No medications on file  Previous Medications   ASCORBIC ACID (VITAMIN C PO)    Take by mouth daily.     ASPIRIN 81 MG TABLET    Take 81 mg by mouth daily. Take one tablet daily   BIOTIN 5 MG TABS    Take by mouth. Take one tablet daily   CHOLECALCIFEROL (VITAMIN D PO)    Take by mouth daily. Take 2,000 daily   DOCUSATE SODIUM (COLACE) 100 MG CAPSULE    Take 100 mg by mouth daily.   MAGNESIUM HYDROXIDE (MILK OF MAGNESIA) 400 MG/5ML SUSPENSION    Take 15 mLs by mouth daily as needed for mild constipation.   MEMANTINE HCL ER (NAMENDA XR) 28 MG CP24    Take 1 capsule by mouth daily.   NAPROXEN SODIUM (ANAPROX) 220 MG TABLET    Take 220 mg by mouth. Take one every 8 hours as needed for discomfort   OMEGA-3 FATTY ACIDS (FISH OIL PO)    Take 100 mg  by mouth daily.   SAW PALMETTO 160 MG CAPSULE    Take 160 mg by mouth. Take one tablet daily   SILODOSIN (RAPAFLO) 8 MG CAPS CAPSULE    Take 8 mg by mouth daily with breakfast.   ZOLPIDEM (AMBIEN) 5 MG TABLET    Take 5 mg by mouth at bedtime as needed for sleep.  Modified Medications   No medications on file  Discontinued Medications   BIOTIN 10 MG TABS    Take 1 tablet by mouth daily.   [name] 125 MCG/0.5ML SOPN       ZOLPIDEM (AMBIEN) 10 MG TABLET    Take 10 mg by mouth at bedtime.    Review of Systems  Constitutional: Positive for fatigue. Negative for fever, activity change, appetite change and unexpected weight change.  HENT: Negative for congestion, ear pain, hearing loss, rhinorrhea, sore throat, tinnitus, trouble swallowing and voice change.   Eyes:       Corrective lenses  Respiratory: Negative for cough, choking, chest tightness, shortness of breath and wheezing.   Cardiovascular: Negative for chest pain, palpitations and leg swelling.  Gastrointestinal: Negative for nausea, abdominal pain, diarrhea, constipation and abdominal distention.  Endocrine: Negative for cold intolerance, heat intolerance, polydipsia, polyphagia and polyuria.  Genitourinary: Negative for dysuria, urgency, frequency and testicular pain.       Bladder discomfort and occasional incontinence. Followed by Dr. Evans, urologist.  Musculoskeletal: Positive for gait problem. Negative for myalgias, back pain, arthralgias and neck pain.       Unstable gait  Skin: Negative for color change, pallor and rash.  Allergic/Immunologic: Negative.   Neurological: Negative for dizziness, tremors, syncope, speech difficulty, weakness, numbness and headaches.       History of multiple sclerosis. History of progressive weakness related to this. History of right foot drop and use of AFO.  Hematological: Negative for adenopathy. Does not bruise/bleed easily.  Psychiatric/Behavioral: Negative for hallucinations, behavioral  problems, confusion, sleep disturbance and decreased concentration. The patient is not nervous/anxious.     There were no vitals filed for this visit. There is no weight on file to calculate BMI.  Physical Exam  Constitutional: He is oriented to person, place,   and time. He appears well-developed and well-nourished. No distress.  HENT:  Right Ear: External ear normal.  Left Ear: External ear normal.  Nose: Nose normal.  Mouth/Throat: Oropharynx is clear and moist. No oropharyngeal exudate.  Eyes: Conjunctivae and EOM are normal. Pupils are equal, round, and reactive to light.  Corrective lenses  Neck: No JVD present. No tracheal deviation present. No thyromegaly present.  Cardiovascular: Normal rate, regular rhythm, normal heart sounds and intact distal pulses.  Exam reveals no gallop and no friction rub.   No murmur heard. Pulmonary/Chest: No respiratory distress. He has no wheezes. He has no rales. He exhibits no tenderness.  Abdominal: He exhibits no distension and no mass. There is no tenderness.  Musculoskeletal: Normal range of motion. He exhibits no edema or tenderness.  Left foot drop. Using AFO.  Lymphadenopathy:    He has no cervical adenopathy.  Neurological: He is alert and oriented to person, place, and time. He has normal reflexes. No cranial nerve deficit. Coordination normal.  Generalized weakness with prominence on the left side related to his progressive multiple sclerosis. Memory loss. MMSE on 02/01/14 was 25/30. Passed clock drawing. test  Skin: No rash noted. No erythema. No pallor.  Psychiatric: He has a normal mood and affect. His behavior is normal. Judgment and thought content normal.     Labs reviewed: Office Visit on 05/13/2014  Component Date Value Ref Range Status  . Color, UA 05/13/2014 yellow   Final  . Clarity, UA 05/13/2014 clear   Final  . Glucose, UA 05/13/2014 neg   Final  . Bilirubin, UA 05/13/2014 neg   Final  . Ketones, UA 05/13/2014 neg    Final  . Spec Grav, UA 05/13/2014 1.015   Final  . Blood, UA 05/13/2014 neg   Final  . pH, UA 05/13/2014 6.0   Final  . Protein, UA 05/13/2014 neg   Final  . Urobilinogen, UA 05/13/2014 negative   Final  . Nitrite, UA 05/13/2014 neg   Final  . Leukocytes, UA 05/13/2014 Negative   Final  Lab on 05/13/2014  Component Date Value Ref Range Status  . WBC 05/13/2014 7.7  4.0 - 10.5 K/uL Final  . RBC 05/13/2014 5.37  4.22 - 5.81 MIL/uL Final  . Hemoglobin 05/13/2014 16.0  13.0 - 17.0 g/dL Final  . HCT 05/13/2014 46.5  39.0 - 52.0 % Final  . MCV 05/13/2014 86.6  78.0 - 100.0 fL Final  . MCH 05/13/2014 29.8  26.0 - 34.0 pg Final  . MCHC 05/13/2014 34.4  30.0 - 36.0 g/dL Final  . RDW 05/13/2014 15.1  11.5 - 15.5 % Final  . Platelets 05/13/2014 174  150 - 400 K/uL Final  . Neutrophils Relative % 05/13/2014 68  43 - 77 % Final  . Neutro Abs 05/13/2014 5.2  1.7 - 7.7 K/uL Final  . Lymphocytes Relative 05/13/2014 24  12 - 46 % Final  . Lymphs Abs 05/13/2014 1.8  0.7 - 4.0 K/uL Final  . Monocytes Relative 05/13/2014 7  3 - 12 % Final  . Monocytes Absolute 05/13/2014 0.5  0.1 - 1.0 K/uL Final  . Eosinophils Relative 05/13/2014 1  0 - 5 % Final  . Eosinophils Absolute 05/13/2014 0.1  0.0 - 0.7 K/uL Final  . Basophils Relative 05/13/2014 0  0 - 1 % Final  . Basophils Absolute 05/13/2014 0.0  0.0 - 0.1 K/uL Final  . Smear Review 05/13/2014 Criteria for review not met   Final  . Sodium   05/13/2014 136  135 - 145 mEq/L Final  . Potassium 05/13/2014 4.0  3.5 - 5.3 mEq/L Final  . Chloride 05/13/2014 102  96 - 112 mEq/L Final  . CO2 05/13/2014 24  19 - 32 mEq/L Final  . Glucose, Bld 05/13/2014 87  70 - 99 mg/dL Final  . BUN 05/13/2014 18  6 - 23 mg/dL Final  . Creat 05/13/2014 0.88  0.50 - 1.35 mg/dL Final  . Total Bilirubin 05/13/2014 0.6  0.2 - 1.2 mg/dL Final  . Alkaline Phosphatase 05/13/2014 58  39 - 117 U/L Final  . AST 05/13/2014 17  0 - 37 U/L Final  . ALT 05/13/2014 14  0 - 53 U/L Final  .  Total Protein 05/13/2014 6.7  6.0 - 8.3 g/dL Final  . Albumin 05/13/2014 4.4  3.5 - 5.2 g/dL Final  . Calcium 05/13/2014 9.6  8.4 - 10.5 mg/dL Final  . Cholesterol 05/13/2014 213* 0 - 200 mg/dL Final   Comment: ATP III Classification:                                < 200        mg/dL        Desirable                               200 - 239     mg/dL        Borderline High                               >= 240        mg/dL        High                             . Triglycerides 05/13/2014 120  <150 mg/dL Final  . HDL 05/13/2014 54  >39 mg/dL Final  . Total CHOL/HDL Ratio 05/13/2014 3.9   Final  . VLDL 05/13/2014 24  0 - 40 mg/dL Final  . LDL Cholesterol 05/13/2014 135* 0 - 99 mg/dL Final   Comment:                            Total Cholesterol/HDL Ratio:CHD Risk                                                 Coronary Heart Disease Risk Table                                                                 Men       Women                                   1/2 Average Risk                3.4        3.3                                       Average Risk              5.0        4.4                                    2X Average Risk              9.6        7.1                                    3X Average Risk             23.4       11.0                          Use the calculated Patient Ratio above and the CHD Risk table                           to determine the patient's CHD Risk.                          ATP III Classification (LDL):                                < 100        mg/dL         Optimal                               100 - 129     mg/dL         Near or Above Optimal                               130 - 159     mg/dL         Borderline High                               160 - 189     mg/dL         High                                > 190        mg/dL         Very High                             . PSA 05/13/2014 4.07* <=4.00 ng/mL Final   Comment: Result repeated and verified.                             Test Methodology: ECLIA PSA (Electrochemiluminescence Immunoassay)                                                     For PSA values from 2.5-4.0, particularly in younger men <60 years                          old, the AUA and NCCN suggest testing for % Free PSA (3515) and                          evaluation of the rate of increase in PSA (PSA velocity).      Assessment/Plan  1. Multiple sclerosis Patient will continue to see Dr. Leta Baptist  2. Neurogenic bladder Patient will continue to see Dr. Alona Bene  3. Memory loss due to medical condition Patient did not tolerate Aricept in the past. He is currently taking memantine and seems to be tolerating it..  4. Generalized weakness Likely continue to progress  5. Gait disorder Currently he can get out of a chair by himself and sit down by himself he ambulates with his walker.  6. Hyperlipidemia Follow-up lab in assisted living   7. BPH (benign prostatic hyperplasia) Continue to see Dr. Alona Bene. Continue Rapaflo.

## 2014-08-18 DIAGNOSIS — R269 Unspecified abnormalities of gait and mobility: Secondary | ICD-10-CM | POA: Diagnosis not present

## 2014-08-18 DIAGNOSIS — R260 Ataxic gait: Secondary | ICD-10-CM | POA: Diagnosis not present

## 2014-08-18 DIAGNOSIS — M6281 Muscle weakness (generalized): Secondary | ICD-10-CM | POA: Diagnosis not present

## 2014-08-18 DIAGNOSIS — R270 Ataxia, unspecified: Secondary | ICD-10-CM | POA: Diagnosis not present

## 2014-08-18 DIAGNOSIS — R488 Other symbolic dysfunctions: Secondary | ICD-10-CM | POA: Diagnosis not present

## 2014-08-18 DIAGNOSIS — R279 Unspecified lack of coordination: Secondary | ICD-10-CM | POA: Diagnosis not present

## 2014-08-19 DIAGNOSIS — R260 Ataxic gait: Secondary | ICD-10-CM | POA: Diagnosis not present

## 2014-08-19 DIAGNOSIS — R413 Other amnesia: Secondary | ICD-10-CM | POA: Diagnosis not present

## 2014-08-19 DIAGNOSIS — R296 Repeated falls: Secondary | ICD-10-CM | POA: Diagnosis not present

## 2014-08-19 DIAGNOSIS — M6281 Muscle weakness (generalized): Secondary | ICD-10-CM | POA: Diagnosis not present

## 2014-08-19 DIAGNOSIS — G3281 Cerebellar ataxia in diseases classified elsewhere: Secondary | ICD-10-CM | POA: Diagnosis not present

## 2014-08-19 DIAGNOSIS — R270 Ataxia, unspecified: Secondary | ICD-10-CM | POA: Diagnosis not present

## 2014-08-19 DIAGNOSIS — R269 Unspecified abnormalities of gait and mobility: Secondary | ICD-10-CM | POA: Diagnosis not present

## 2014-08-19 DIAGNOSIS — G8191 Hemiplegia, unspecified affecting right dominant side: Secondary | ICD-10-CM | POA: Diagnosis not present

## 2014-08-19 DIAGNOSIS — R5383 Other fatigue: Secondary | ICD-10-CM | POA: Diagnosis not present

## 2014-08-19 DIAGNOSIS — R279 Unspecified lack of coordination: Secondary | ICD-10-CM | POA: Diagnosis not present

## 2014-08-19 DIAGNOSIS — G35 Multiple sclerosis: Secondary | ICD-10-CM | POA: Diagnosis not present

## 2014-08-19 DIAGNOSIS — F0280 Dementia in other diseases classified elsewhere without behavioral disturbance: Secondary | ICD-10-CM | POA: Diagnosis not present

## 2014-08-19 DIAGNOSIS — R488 Other symbolic dysfunctions: Secondary | ICD-10-CM | POA: Diagnosis not present

## 2014-08-20 DIAGNOSIS — R279 Unspecified lack of coordination: Secondary | ICD-10-CM | POA: Diagnosis not present

## 2014-08-20 DIAGNOSIS — R270 Ataxia, unspecified: Secondary | ICD-10-CM | POA: Diagnosis not present

## 2014-08-20 DIAGNOSIS — R269 Unspecified abnormalities of gait and mobility: Secondary | ICD-10-CM | POA: Diagnosis not present

## 2014-08-20 DIAGNOSIS — R488 Other symbolic dysfunctions: Secondary | ICD-10-CM | POA: Diagnosis not present

## 2014-08-20 DIAGNOSIS — M6281 Muscle weakness (generalized): Secondary | ICD-10-CM | POA: Diagnosis not present

## 2014-08-20 DIAGNOSIS — R260 Ataxic gait: Secondary | ICD-10-CM | POA: Diagnosis not present

## 2014-08-21 DIAGNOSIS — R269 Unspecified abnormalities of gait and mobility: Secondary | ICD-10-CM | POA: Diagnosis not present

## 2014-08-21 DIAGNOSIS — R488 Other symbolic dysfunctions: Secondary | ICD-10-CM | POA: Diagnosis not present

## 2014-08-21 DIAGNOSIS — R279 Unspecified lack of coordination: Secondary | ICD-10-CM | POA: Diagnosis not present

## 2014-08-21 DIAGNOSIS — R260 Ataxic gait: Secondary | ICD-10-CM | POA: Diagnosis not present

## 2014-08-21 DIAGNOSIS — M6281 Muscle weakness (generalized): Secondary | ICD-10-CM | POA: Diagnosis not present

## 2014-08-21 DIAGNOSIS — R270 Ataxia, unspecified: Secondary | ICD-10-CM | POA: Diagnosis not present

## 2014-08-22 DIAGNOSIS — R260 Ataxic gait: Secondary | ICD-10-CM | POA: Diagnosis not present

## 2014-08-22 DIAGNOSIS — R279 Unspecified lack of coordination: Secondary | ICD-10-CM | POA: Diagnosis not present

## 2014-08-22 DIAGNOSIS — R269 Unspecified abnormalities of gait and mobility: Secondary | ICD-10-CM | POA: Diagnosis not present

## 2014-08-22 DIAGNOSIS — M6281 Muscle weakness (generalized): Secondary | ICD-10-CM | POA: Diagnosis not present

## 2014-08-22 DIAGNOSIS — R488 Other symbolic dysfunctions: Secondary | ICD-10-CM | POA: Diagnosis not present

## 2014-08-22 DIAGNOSIS — R270 Ataxia, unspecified: Secondary | ICD-10-CM | POA: Diagnosis not present

## 2014-08-25 DIAGNOSIS — M6281 Muscle weakness (generalized): Secondary | ICD-10-CM | POA: Diagnosis not present

## 2014-08-25 DIAGNOSIS — R279 Unspecified lack of coordination: Secondary | ICD-10-CM | POA: Diagnosis not present

## 2014-08-25 DIAGNOSIS — R260 Ataxic gait: Secondary | ICD-10-CM | POA: Diagnosis not present

## 2014-08-25 DIAGNOSIS — R488 Other symbolic dysfunctions: Secondary | ICD-10-CM | POA: Diagnosis not present

## 2014-08-25 DIAGNOSIS — R270 Ataxia, unspecified: Secondary | ICD-10-CM | POA: Diagnosis not present

## 2014-08-25 DIAGNOSIS — R269 Unspecified abnormalities of gait and mobility: Secondary | ICD-10-CM | POA: Diagnosis not present

## 2014-08-26 DIAGNOSIS — R270 Ataxia, unspecified: Secondary | ICD-10-CM | POA: Diagnosis not present

## 2014-08-26 DIAGNOSIS — R260 Ataxic gait: Secondary | ICD-10-CM | POA: Diagnosis not present

## 2014-08-26 DIAGNOSIS — R269 Unspecified abnormalities of gait and mobility: Secondary | ICD-10-CM | POA: Diagnosis not present

## 2014-08-26 DIAGNOSIS — R279 Unspecified lack of coordination: Secondary | ICD-10-CM | POA: Diagnosis not present

## 2014-08-26 DIAGNOSIS — M6281 Muscle weakness (generalized): Secondary | ICD-10-CM | POA: Diagnosis not present

## 2014-08-26 DIAGNOSIS — R488 Other symbolic dysfunctions: Secondary | ICD-10-CM | POA: Diagnosis not present

## 2014-08-27 DIAGNOSIS — R269 Unspecified abnormalities of gait and mobility: Secondary | ICD-10-CM | POA: Diagnosis not present

## 2014-08-27 DIAGNOSIS — R279 Unspecified lack of coordination: Secondary | ICD-10-CM | POA: Diagnosis not present

## 2014-08-27 DIAGNOSIS — M6281 Muscle weakness (generalized): Secondary | ICD-10-CM | POA: Diagnosis not present

## 2014-08-27 DIAGNOSIS — R488 Other symbolic dysfunctions: Secondary | ICD-10-CM | POA: Diagnosis not present

## 2014-08-27 DIAGNOSIS — R270 Ataxia, unspecified: Secondary | ICD-10-CM | POA: Diagnosis not present

## 2014-08-27 DIAGNOSIS — R260 Ataxic gait: Secondary | ICD-10-CM | POA: Diagnosis not present

## 2014-08-28 DIAGNOSIS — R339 Retention of urine, unspecified: Secondary | ICD-10-CM | POA: Diagnosis not present

## 2014-08-28 DIAGNOSIS — N400 Enlarged prostate without lower urinary tract symptoms: Secondary | ICD-10-CM | POA: Diagnosis not present

## 2014-08-28 DIAGNOSIS — M6281 Muscle weakness (generalized): Secondary | ICD-10-CM | POA: Diagnosis not present

## 2014-08-28 DIAGNOSIS — R269 Unspecified abnormalities of gait and mobility: Secondary | ICD-10-CM | POA: Diagnosis not present

## 2014-08-28 DIAGNOSIS — R270 Ataxia, unspecified: Secondary | ICD-10-CM | POA: Diagnosis not present

## 2014-08-28 DIAGNOSIS — R260 Ataxic gait: Secondary | ICD-10-CM | POA: Diagnosis not present

## 2014-08-28 DIAGNOSIS — G35 Multiple sclerosis: Secondary | ICD-10-CM | POA: Diagnosis not present

## 2014-08-28 DIAGNOSIS — R279 Unspecified lack of coordination: Secondary | ICD-10-CM | POA: Diagnosis not present

## 2014-08-28 DIAGNOSIS — N319 Neuromuscular dysfunction of bladder, unspecified: Secondary | ICD-10-CM | POA: Diagnosis not present

## 2014-08-28 DIAGNOSIS — R488 Other symbolic dysfunctions: Secondary | ICD-10-CM | POA: Diagnosis not present

## 2014-08-29 DIAGNOSIS — R260 Ataxic gait: Secondary | ICD-10-CM | POA: Diagnosis not present

## 2014-08-29 DIAGNOSIS — R269 Unspecified abnormalities of gait and mobility: Secondary | ICD-10-CM | POA: Diagnosis not present

## 2014-08-29 DIAGNOSIS — R270 Ataxia, unspecified: Secondary | ICD-10-CM | POA: Diagnosis not present

## 2014-08-29 DIAGNOSIS — R279 Unspecified lack of coordination: Secondary | ICD-10-CM | POA: Diagnosis not present

## 2014-08-29 DIAGNOSIS — M6281 Muscle weakness (generalized): Secondary | ICD-10-CM | POA: Diagnosis not present

## 2014-08-29 DIAGNOSIS — R488 Other symbolic dysfunctions: Secondary | ICD-10-CM | POA: Diagnosis not present

## 2014-09-01 ENCOUNTER — Telehealth: Payer: Self-pay

## 2014-09-01 NOTE — Telephone Encounter (Signed)
Left message that the AL nurses have been given the order and Rx for the AFO. She needs to check with them. If there is any problems she can call us back.

## 2014-09-02 DIAGNOSIS — R269 Unspecified abnormalities of gait and mobility: Secondary | ICD-10-CM | POA: Diagnosis not present

## 2014-09-02 DIAGNOSIS — M6281 Muscle weakness (generalized): Secondary | ICD-10-CM | POA: Diagnosis not present

## 2014-09-02 DIAGNOSIS — R260 Ataxic gait: Secondary | ICD-10-CM | POA: Diagnosis not present

## 2014-09-02 DIAGNOSIS — R279 Unspecified lack of coordination: Secondary | ICD-10-CM | POA: Diagnosis not present

## 2014-09-02 DIAGNOSIS — R270 Ataxia, unspecified: Secondary | ICD-10-CM | POA: Diagnosis not present

## 2014-09-02 DIAGNOSIS — R488 Other symbolic dysfunctions: Secondary | ICD-10-CM | POA: Diagnosis not present

## 2014-09-03 DIAGNOSIS — M6281 Muscle weakness (generalized): Secondary | ICD-10-CM | POA: Diagnosis not present

## 2014-09-03 DIAGNOSIS — R279 Unspecified lack of coordination: Secondary | ICD-10-CM | POA: Diagnosis not present

## 2014-09-03 DIAGNOSIS — R488 Other symbolic dysfunctions: Secondary | ICD-10-CM | POA: Diagnosis not present

## 2014-09-03 DIAGNOSIS — R270 Ataxia, unspecified: Secondary | ICD-10-CM | POA: Diagnosis not present

## 2014-09-03 DIAGNOSIS — R260 Ataxic gait: Secondary | ICD-10-CM | POA: Diagnosis not present

## 2014-09-03 DIAGNOSIS — R269 Unspecified abnormalities of gait and mobility: Secondary | ICD-10-CM | POA: Diagnosis not present

## 2014-09-04 DIAGNOSIS — R269 Unspecified abnormalities of gait and mobility: Secondary | ICD-10-CM | POA: Diagnosis not present

## 2014-09-04 DIAGNOSIS — R279 Unspecified lack of coordination: Secondary | ICD-10-CM | POA: Diagnosis not present

## 2014-09-04 DIAGNOSIS — R270 Ataxia, unspecified: Secondary | ICD-10-CM | POA: Diagnosis not present

## 2014-09-04 DIAGNOSIS — R260 Ataxic gait: Secondary | ICD-10-CM | POA: Diagnosis not present

## 2014-09-04 DIAGNOSIS — R488 Other symbolic dysfunctions: Secondary | ICD-10-CM | POA: Diagnosis not present

## 2014-09-04 DIAGNOSIS — M6281 Muscle weakness (generalized): Secondary | ICD-10-CM | POA: Diagnosis not present

## 2014-09-05 DIAGNOSIS — R260 Ataxic gait: Secondary | ICD-10-CM | POA: Diagnosis not present

## 2014-09-05 DIAGNOSIS — R488 Other symbolic dysfunctions: Secondary | ICD-10-CM | POA: Diagnosis not present

## 2014-09-05 DIAGNOSIS — R269 Unspecified abnormalities of gait and mobility: Secondary | ICD-10-CM | POA: Diagnosis not present

## 2014-09-05 DIAGNOSIS — M6281 Muscle weakness (generalized): Secondary | ICD-10-CM | POA: Diagnosis not present

## 2014-09-05 DIAGNOSIS — R279 Unspecified lack of coordination: Secondary | ICD-10-CM | POA: Diagnosis not present

## 2014-09-05 DIAGNOSIS — R270 Ataxia, unspecified: Secondary | ICD-10-CM | POA: Diagnosis not present

## 2014-09-08 ENCOUNTER — Non-Acute Institutional Stay: Payer: Medicare Other | Admitting: Adult Health

## 2014-09-08 ENCOUNTER — Encounter: Payer: Self-pay | Admitting: Adult Health

## 2014-09-08 DIAGNOSIS — R609 Edema, unspecified: Secondary | ICD-10-CM | POA: Insufficient documentation

## 2014-09-08 DIAGNOSIS — M6281 Muscle weakness (generalized): Secondary | ICD-10-CM | POA: Diagnosis not present

## 2014-09-08 DIAGNOSIS — R197 Diarrhea, unspecified: Secondary | ICD-10-CM | POA: Diagnosis not present

## 2014-09-08 DIAGNOSIS — R279 Unspecified lack of coordination: Secondary | ICD-10-CM | POA: Diagnosis not present

## 2014-09-08 DIAGNOSIS — R260 Ataxic gait: Secondary | ICD-10-CM | POA: Diagnosis not present

## 2014-09-08 DIAGNOSIS — R270 Ataxia, unspecified: Secondary | ICD-10-CM | POA: Diagnosis not present

## 2014-09-08 DIAGNOSIS — R269 Unspecified abnormalities of gait and mobility: Secondary | ICD-10-CM | POA: Diagnosis not present

## 2014-09-08 DIAGNOSIS — R488 Other symbolic dysfunctions: Secondary | ICD-10-CM | POA: Diagnosis not present

## 2014-09-08 NOTE — Progress Notes (Signed)
Patient ID: [name], male   DOB: [birth year], 73 y.o.   MRN: [id number] I have reviewed the most recent 2D echo (01/14/14)  ------------------------------------------------------------ Study Conclusions  - Suboptimal image quality making interpretation difficult. - Left ventricle: Systolic function was normal. The estimated ejection fraction was in the range of 60% to 65%. Features are consistent with a pseudonormal left ventricular filling pattern, with concomitant abnormal relaxation and increased filling pressure (grade 2 diastolic dysfunction). Impressions:  - No cardiac source of embolism was identified, but cannot be ruled out on the basis of this examination.

## 2014-09-08 NOTE — Assessment & Plan Note (Signed)
Worse per nurse. Last echo showed grade 2 diastolic dysfxn. No associated symptoms. Try TED hose, on in the am and off in the pm. BMP in the am

## 2014-09-08 NOTE — Progress Notes (Signed)
Patient ID: [name], male   DOB: 1940/12/23, 73 y.o.   MRN: [id number]  Nursing Home Location:  [facility]  Code Status: FULL CODE   Place of Service: ALF (13)  Chief Complaint  Patient presents with  . Acute Visit    edema    HPI:  73 y.o.  Male residing at [facility] [place] [facility]. I was asked to see him today for edema to both legs that was noticed 1 week ago. The resident has a hx of MS with progression, recently moved to [place]. He uses a walker and has right foot drop and has an AFO ordered. He denies SOB, CP, or fall. He has no redness or calf pain. He has gained 8 lbs since his arrival and is currently 198 lbs. He reports being much less mobile and is no longer working out. He also reports an occasional loose stool. He denies recent antibiotic use, abd pain, nausea, vomiting, or decreased appetite.    Review of Systems:  Review of Systems  Constitutional: Positive for activity change and unexpected weight change. Negative for fever, chills, diaphoresis, appetite change and fatigue.  Respiratory: Negative for cough, chest tightness, shortness of breath and wheezing.   Cardiovascular: Positive for leg swelling. Negative for chest pain and palpitations.  Gastrointestinal: Positive for diarrhea. Negative for nausea, vomiting, abdominal pain, blood in stool and abdominal distention.  Genitourinary: Positive for difficulty urinating. Negative for dysuria.  Musculoskeletal: Positive for gait problem. Negative for myalgias, joint swelling and arthralgias.  Neurological: Positive for weakness. Negative for dizziness, seizures, facial asymmetry and speech difficulty.  Psychiatric/Behavioral: Negative for behavioral problems, confusion and agitation.    Medications: Patient's Medications  New Prescriptions   No medications on file  Previous Medications   ASCORBIC ACID (VITAMIN C PO)    Take by mouth daily.     ASPIRIN 81 MG TABLET    Take 81 mg by mouth daily. Take one tablet daily   BIOTIN 5 MG TABS    Take by mouth. Take one tablet daily   CHOLECALCIFEROL (VITAMIN D PO)    Take by mouth daily. Take 2,000 daily   DOCUSATE SODIUM (COLACE) 100 MG CAPSULE    Take 100 mg by mouth daily.   MAGNESIUM HYDROXIDE (MILK OF MAGNESIA) 400 MG/5ML SUSPENSION    Take 15 mLs by mouth daily as needed for mild constipation.   MEMANTINE HCL ER (NAMENDA XR) 28 MG CP24    Take 1 capsule by mouth daily.   NAPROXEN SODIUM (ANAPROX) 220 MG TABLET    Take 220 mg by mouth. Take one every 8 hours as needed for discomfort   OMEGA-3 FATTY ACIDS (FISH OIL PO)    Take 100 mg by mouth daily.   SAW PALMETTO 160 MG CAPSULE    Take 160 mg by mouth. Take one tablet daily   SERTRALINE (ZOLOFT) 50 MG TABLET    Take 50 mg by mouth daily.   SILODOSIN (RAPAFLO) 8 MG CAPS CAPSULE    Take 8 mg by mouth daily with breakfast.   ZOLPIDEM (AMBIEN) 5 MG TABLET    Take 5 mg by mouth at bedtime as needed for sleep.  Modified Medications   No medications on file  Discontinued Medications   No medications on file     Physical Exam:  Filed Vitals:   09/08/14 1156  BP: 119/75  Pulse: 67  Temp: 97.7 F (36.5 C)  Resp: 20  Weight: 198  lb (89.812 kg)  SpO2: 96%    Physical Exam  Constitutional: No distress.  frail  Neck: No JVD present.  Cardiovascular: Normal rate, regular rhythm and intact distal pulses.   No murmur heard. +2 pitting edema BLE  Pulmonary/Chest: Effort normal and breath sounds normal. No respiratory distress. He has no wheezes.  Abdominal: Soft. Bowel sounds are normal. He exhibits no distension and no mass. There is no tenderness. There is no rebound.  Skin: Skin is warm and dry. He is not diaphoretic. No erythema.  Dry, flaky skin  Psychiatric: Affect and judgment normal.    Labs reviewed/Significant Diagnostic Results:  Basic Metabolic Panel:  Recent Labs  05/13/14 0924  NA 136  K 4.0  CL 102  CO2 24    GLUCOSE 87  BUN 18  CREATININE 0.88  CALCIUM 9.6   Liver Function Tests:  Recent Labs  05/13/14 0924  AST 17  ALT 14  ALKPHOS 58  BILITOT 0.6  PROT 6.7  ALBUMIN 4.4   No results for input(s): LIPASE, AMYLASE in the last 8760 hours. No results for input(s): AMMONIA in the last 8760 hours. CBC:  Recent Labs  05/13/14 0924  WBC 7.7  NEUTROABS 5.2  HGB 16.0  HCT 46.5  MCV 86.6  PLT 174   CBG: No results for input(s): GLUCAP in the last 8760 hours. TSH: No results for input(s): TSH in the last 8760 hours. A1C: Lab Results  Component Value Date   HGBA1C 5.7* 01/13/2013   Lipid Panel:  Recent Labs  05/13/14 0924  CHOL 213*  HDL 54  LDLCALC 135*  TRIG 120  CHOLHDL 3.9     Assessment/Plan Edema Worse per nurse. Last echo showed grade 2 diastolic dysfxn. No associated symptoms. Try TED hose, on in the am and off in the pm. BMP in the am  Diarrhea Reports occasional loose stools. No recent antibiotic use. No abd pain or fever. Hold Colace for loose stool and monitor.    Labs/tests ordered BMP  Cindi Carbon, ANP [facility] [phone]

## 2014-09-08 NOTE — Assessment & Plan Note (Signed)
Reports occasional loose stools. No recent antibiotic use. No abd pain or fever. Hold Colace for loose stool and monitor.

## 2014-09-09 DIAGNOSIS — R609 Edema, unspecified: Secondary | ICD-10-CM | POA: Diagnosis not present

## 2014-09-09 DIAGNOSIS — R260 Ataxic gait: Secondary | ICD-10-CM | POA: Diagnosis not present

## 2014-09-09 DIAGNOSIS — M6281 Muscle weakness (generalized): Secondary | ICD-10-CM | POA: Diagnosis not present

## 2014-09-09 DIAGNOSIS — R279 Unspecified lack of coordination: Secondary | ICD-10-CM | POA: Diagnosis not present

## 2014-09-09 DIAGNOSIS — R270 Ataxia, unspecified: Secondary | ICD-10-CM | POA: Diagnosis not present

## 2014-09-09 DIAGNOSIS — R269 Unspecified abnormalities of gait and mobility: Secondary | ICD-10-CM | POA: Diagnosis not present

## 2014-09-09 DIAGNOSIS — R488 Other symbolic dysfunctions: Secondary | ICD-10-CM | POA: Diagnosis not present

## 2014-09-09 LAB — BASIC METABOLIC PANEL
BUN: 14 mg/dL (ref 4–21)
CREATININE: 0.8 mg/dL (ref 0.6–1.3)
Glucose: 83 mg/dL
Potassium: 3.6 mmol/L (ref 3.4–5.3)
SODIUM: 140 mmol/L (ref 137–147)

## 2014-09-10 DIAGNOSIS — R270 Ataxia, unspecified: Secondary | ICD-10-CM | POA: Diagnosis not present

## 2014-09-10 DIAGNOSIS — R260 Ataxic gait: Secondary | ICD-10-CM | POA: Diagnosis not present

## 2014-09-10 DIAGNOSIS — R279 Unspecified lack of coordination: Secondary | ICD-10-CM | POA: Diagnosis not present

## 2014-09-10 DIAGNOSIS — R269 Unspecified abnormalities of gait and mobility: Secondary | ICD-10-CM | POA: Diagnosis not present

## 2014-09-10 DIAGNOSIS — R488 Other symbolic dysfunctions: Secondary | ICD-10-CM | POA: Diagnosis not present

## 2014-09-10 DIAGNOSIS — M6281 Muscle weakness (generalized): Secondary | ICD-10-CM | POA: Diagnosis not present

## 2014-09-15 DIAGNOSIS — R260 Ataxic gait: Secondary | ICD-10-CM | POA: Diagnosis not present

## 2014-09-15 DIAGNOSIS — R488 Other symbolic dysfunctions: Secondary | ICD-10-CM | POA: Diagnosis not present

## 2014-09-15 DIAGNOSIS — R279 Unspecified lack of coordination: Secondary | ICD-10-CM | POA: Diagnosis not present

## 2014-09-15 DIAGNOSIS — R270 Ataxia, unspecified: Secondary | ICD-10-CM | POA: Diagnosis not present

## 2014-09-15 DIAGNOSIS — M6281 Muscle weakness (generalized): Secondary | ICD-10-CM | POA: Diagnosis not present

## 2014-09-15 DIAGNOSIS — R269 Unspecified abnormalities of gait and mobility: Secondary | ICD-10-CM | POA: Diagnosis not present

## 2014-09-16 DIAGNOSIS — R279 Unspecified lack of coordination: Secondary | ICD-10-CM | POA: Diagnosis not present

## 2014-09-16 DIAGNOSIS — R269 Unspecified abnormalities of gait and mobility: Secondary | ICD-10-CM | POA: Diagnosis not present

## 2014-09-16 DIAGNOSIS — R270 Ataxia, unspecified: Secondary | ICD-10-CM | POA: Diagnosis not present

## 2014-09-16 DIAGNOSIS — R260 Ataxic gait: Secondary | ICD-10-CM | POA: Diagnosis not present

## 2014-09-16 DIAGNOSIS — M6281 Muscle weakness (generalized): Secondary | ICD-10-CM | POA: Diagnosis not present

## 2014-09-16 DIAGNOSIS — R488 Other symbolic dysfunctions: Secondary | ICD-10-CM | POA: Diagnosis not present

## 2014-09-17 DIAGNOSIS — R488 Other symbolic dysfunctions: Secondary | ICD-10-CM | POA: Diagnosis not present

## 2014-09-17 DIAGNOSIS — R269 Unspecified abnormalities of gait and mobility: Secondary | ICD-10-CM | POA: Diagnosis not present

## 2014-09-17 DIAGNOSIS — M6281 Muscle weakness (generalized): Secondary | ICD-10-CM | POA: Diagnosis not present

## 2014-09-17 DIAGNOSIS — R260 Ataxic gait: Secondary | ICD-10-CM | POA: Diagnosis not present

## 2014-09-17 DIAGNOSIS — R270 Ataxia, unspecified: Secondary | ICD-10-CM | POA: Diagnosis not present

## 2014-09-17 DIAGNOSIS — R279 Unspecified lack of coordination: Secondary | ICD-10-CM | POA: Diagnosis not present

## 2014-09-18 DIAGNOSIS — R270 Ataxia, unspecified: Secondary | ICD-10-CM | POA: Diagnosis not present

## 2014-09-18 DIAGNOSIS — R260 Ataxic gait: Secondary | ICD-10-CM | POA: Diagnosis not present

## 2014-09-18 DIAGNOSIS — M6281 Muscle weakness (generalized): Secondary | ICD-10-CM | POA: Diagnosis not present

## 2014-09-18 DIAGNOSIS — R488 Other symbolic dysfunctions: Secondary | ICD-10-CM | POA: Diagnosis not present

## 2014-09-18 DIAGNOSIS — R269 Unspecified abnormalities of gait and mobility: Secondary | ICD-10-CM | POA: Diagnosis not present

## 2014-09-18 DIAGNOSIS — R279 Unspecified lack of coordination: Secondary | ICD-10-CM | POA: Diagnosis not present

## 2014-09-20 DIAGNOSIS — R5383 Other fatigue: Secondary | ICD-10-CM | POA: Diagnosis not present

## 2014-09-20 DIAGNOSIS — M6281 Muscle weakness (generalized): Secondary | ICD-10-CM | POA: Diagnosis not present

## 2014-09-20 DIAGNOSIS — G35 Multiple sclerosis: Secondary | ICD-10-CM | POA: Diagnosis not present

## 2014-09-20 DIAGNOSIS — G8191 Hemiplegia, unspecified affecting right dominant side: Secondary | ICD-10-CM | POA: Diagnosis not present

## 2014-09-20 DIAGNOSIS — R270 Ataxia, unspecified: Secondary | ICD-10-CM | POA: Diagnosis not present

## 2014-09-20 DIAGNOSIS — R296 Repeated falls: Secondary | ICD-10-CM | POA: Diagnosis not present

## 2014-09-20 DIAGNOSIS — R260 Ataxic gait: Secondary | ICD-10-CM | POA: Diagnosis not present

## 2014-09-20 DIAGNOSIS — G3281 Cerebellar ataxia in diseases classified elsewhere: Secondary | ICD-10-CM | POA: Diagnosis not present

## 2014-09-20 DIAGNOSIS — F0280 Dementia in other diseases classified elsewhere without behavioral disturbance: Secondary | ICD-10-CM | POA: Diagnosis not present

## 2014-09-20 DIAGNOSIS — R488 Other symbolic dysfunctions: Secondary | ICD-10-CM | POA: Diagnosis not present

## 2014-09-20 DIAGNOSIS — R413 Other amnesia: Secondary | ICD-10-CM | POA: Diagnosis not present

## 2014-09-20 DIAGNOSIS — R269 Unspecified abnormalities of gait and mobility: Secondary | ICD-10-CM | POA: Diagnosis not present

## 2014-09-20 DIAGNOSIS — R279 Unspecified lack of coordination: Secondary | ICD-10-CM | POA: Diagnosis not present

## 2014-09-23 DIAGNOSIS — R488 Other symbolic dysfunctions: Secondary | ICD-10-CM | POA: Diagnosis not present

## 2014-09-23 DIAGNOSIS — M6281 Muscle weakness (generalized): Secondary | ICD-10-CM | POA: Diagnosis not present

## 2014-09-23 DIAGNOSIS — R269 Unspecified abnormalities of gait and mobility: Secondary | ICD-10-CM | POA: Diagnosis not present

## 2014-09-23 DIAGNOSIS — R279 Unspecified lack of coordination: Secondary | ICD-10-CM | POA: Diagnosis not present

## 2014-09-23 DIAGNOSIS — R260 Ataxic gait: Secondary | ICD-10-CM | POA: Diagnosis not present

## 2014-09-23 DIAGNOSIS — R270 Ataxia, unspecified: Secondary | ICD-10-CM | POA: Diagnosis not present

## 2014-09-24 DIAGNOSIS — M6281 Muscle weakness (generalized): Secondary | ICD-10-CM | POA: Diagnosis not present

## 2014-09-24 DIAGNOSIS — R279 Unspecified lack of coordination: Secondary | ICD-10-CM | POA: Diagnosis not present

## 2014-09-24 DIAGNOSIS — R260 Ataxic gait: Secondary | ICD-10-CM | POA: Diagnosis not present

## 2014-09-24 DIAGNOSIS — R269 Unspecified abnormalities of gait and mobility: Secondary | ICD-10-CM | POA: Diagnosis not present

## 2014-09-24 DIAGNOSIS — R488 Other symbolic dysfunctions: Secondary | ICD-10-CM | POA: Diagnosis not present

## 2014-09-24 DIAGNOSIS — R270 Ataxia, unspecified: Secondary | ICD-10-CM | POA: Diagnosis not present

## 2014-09-25 DIAGNOSIS — R270 Ataxia, unspecified: Secondary | ICD-10-CM | POA: Diagnosis not present

## 2014-09-25 DIAGNOSIS — R488 Other symbolic dysfunctions: Secondary | ICD-10-CM | POA: Diagnosis not present

## 2014-09-25 DIAGNOSIS — R260 Ataxic gait: Secondary | ICD-10-CM | POA: Diagnosis not present

## 2014-09-25 DIAGNOSIS — M6281 Muscle weakness (generalized): Secondary | ICD-10-CM | POA: Diagnosis not present

## 2014-09-25 DIAGNOSIS — R269 Unspecified abnormalities of gait and mobility: Secondary | ICD-10-CM | POA: Diagnosis not present

## 2014-09-25 DIAGNOSIS — R279 Unspecified lack of coordination: Secondary | ICD-10-CM | POA: Diagnosis not present

## 2014-09-26 DIAGNOSIS — R279 Unspecified lack of coordination: Secondary | ICD-10-CM | POA: Diagnosis not present

## 2014-09-26 DIAGNOSIS — R269 Unspecified abnormalities of gait and mobility: Secondary | ICD-10-CM | POA: Diagnosis not present

## 2014-09-26 DIAGNOSIS — R488 Other symbolic dysfunctions: Secondary | ICD-10-CM | POA: Diagnosis not present

## 2014-09-26 DIAGNOSIS — M6281 Muscle weakness (generalized): Secondary | ICD-10-CM | POA: Diagnosis not present

## 2014-09-26 DIAGNOSIS — R260 Ataxic gait: Secondary | ICD-10-CM | POA: Diagnosis not present

## 2014-09-26 DIAGNOSIS — R270 Ataxia, unspecified: Secondary | ICD-10-CM | POA: Diagnosis not present

## 2014-09-29 DIAGNOSIS — R270 Ataxia, unspecified: Secondary | ICD-10-CM | POA: Diagnosis not present

## 2014-09-29 DIAGNOSIS — R260 Ataxic gait: Secondary | ICD-10-CM | POA: Diagnosis not present

## 2014-09-29 DIAGNOSIS — R488 Other symbolic dysfunctions: Secondary | ICD-10-CM | POA: Diagnosis not present

## 2014-09-29 DIAGNOSIS — M6281 Muscle weakness (generalized): Secondary | ICD-10-CM | POA: Diagnosis not present

## 2014-09-29 DIAGNOSIS — R279 Unspecified lack of coordination: Secondary | ICD-10-CM | POA: Diagnosis not present

## 2014-09-29 DIAGNOSIS — R269 Unspecified abnormalities of gait and mobility: Secondary | ICD-10-CM | POA: Diagnosis not present

## 2014-09-30 DIAGNOSIS — R488 Other symbolic dysfunctions: Secondary | ICD-10-CM | POA: Diagnosis not present

## 2014-09-30 DIAGNOSIS — R270 Ataxia, unspecified: Secondary | ICD-10-CM | POA: Diagnosis not present

## 2014-09-30 DIAGNOSIS — R269 Unspecified abnormalities of gait and mobility: Secondary | ICD-10-CM | POA: Diagnosis not present

## 2014-09-30 DIAGNOSIS — M6281 Muscle weakness (generalized): Secondary | ICD-10-CM | POA: Diagnosis not present

## 2014-09-30 DIAGNOSIS — R260 Ataxic gait: Secondary | ICD-10-CM | POA: Diagnosis not present

## 2014-09-30 DIAGNOSIS — R279 Unspecified lack of coordination: Secondary | ICD-10-CM | POA: Diagnosis not present

## 2014-10-01 DIAGNOSIS — R269 Unspecified abnormalities of gait and mobility: Secondary | ICD-10-CM | POA: Diagnosis not present

## 2014-10-01 DIAGNOSIS — R260 Ataxic gait: Secondary | ICD-10-CM | POA: Diagnosis not present

## 2014-10-01 DIAGNOSIS — R488 Other symbolic dysfunctions: Secondary | ICD-10-CM | POA: Diagnosis not present

## 2014-10-01 DIAGNOSIS — M6281 Muscle weakness (generalized): Secondary | ICD-10-CM | POA: Diagnosis not present

## 2014-10-01 DIAGNOSIS — R270 Ataxia, unspecified: Secondary | ICD-10-CM | POA: Diagnosis not present

## 2014-10-01 DIAGNOSIS — R279 Unspecified lack of coordination: Secondary | ICD-10-CM | POA: Diagnosis not present

## 2014-10-02 DIAGNOSIS — R488 Other symbolic dysfunctions: Secondary | ICD-10-CM | POA: Diagnosis not present

## 2014-10-02 DIAGNOSIS — M6281 Muscle weakness (generalized): Secondary | ICD-10-CM | POA: Diagnosis not present

## 2014-10-02 DIAGNOSIS — R260 Ataxic gait: Secondary | ICD-10-CM | POA: Diagnosis not present

## 2014-10-02 DIAGNOSIS — R269 Unspecified abnormalities of gait and mobility: Secondary | ICD-10-CM | POA: Diagnosis not present

## 2014-10-02 DIAGNOSIS — R270 Ataxia, unspecified: Secondary | ICD-10-CM | POA: Diagnosis not present

## 2014-10-02 DIAGNOSIS — R279 Unspecified lack of coordination: Secondary | ICD-10-CM | POA: Diagnosis not present

## 2014-10-03 ENCOUNTER — Telehealth: Payer: Self-pay | Admitting: Diagnostic Neuroimaging

## 2014-10-03 DIAGNOSIS — R269 Unspecified abnormalities of gait and mobility: Secondary | ICD-10-CM | POA: Diagnosis not present

## 2014-10-03 DIAGNOSIS — R279 Unspecified lack of coordination: Secondary | ICD-10-CM | POA: Diagnosis not present

## 2014-10-03 DIAGNOSIS — R488 Other symbolic dysfunctions: Secondary | ICD-10-CM | POA: Diagnosis not present

## 2014-10-03 DIAGNOSIS — R270 Ataxia, unspecified: Secondary | ICD-10-CM | POA: Diagnosis not present

## 2014-10-03 DIAGNOSIS — R260 Ataxic gait: Secondary | ICD-10-CM | POA: Diagnosis not present

## 2014-10-03 DIAGNOSIS — M6281 Muscle weakness (generalized): Secondary | ICD-10-CM | POA: Diagnosis not present

## 2014-10-03 NOTE — Telephone Encounter (Signed)
[facility] is calling stating that pt is dragging his right leg, he had a fall last evening.  The doctor at [facility] wants to know if he can be seen today.  Please call back and advise.

## 2014-10-05 DIAGNOSIS — R270 Ataxia, unspecified: Secondary | ICD-10-CM | POA: Diagnosis not present

## 2014-10-05 DIAGNOSIS — M6281 Muscle weakness (generalized): Secondary | ICD-10-CM | POA: Diagnosis not present

## 2014-10-05 DIAGNOSIS — R260 Ataxic gait: Secondary | ICD-10-CM | POA: Diagnosis not present

## 2014-10-05 DIAGNOSIS — R269 Unspecified abnormalities of gait and mobility: Secondary | ICD-10-CM | POA: Diagnosis not present

## 2014-10-05 DIAGNOSIS — R488 Other symbolic dysfunctions: Secondary | ICD-10-CM | POA: Diagnosis not present

## 2014-10-05 DIAGNOSIS — R279 Unspecified lack of coordination: Secondary | ICD-10-CM | POA: Diagnosis not present

## 2014-10-06 ENCOUNTER — Non-Acute Institutional Stay: Payer: Medicare Other | Admitting: Adult Health

## 2014-10-06 DIAGNOSIS — G35 Multiple sclerosis: Secondary | ICD-10-CM

## 2014-10-06 DIAGNOSIS — R609 Edema, unspecified: Secondary | ICD-10-CM | POA: Diagnosis not present

## 2014-10-06 DIAGNOSIS — R413 Other amnesia: Secondary | ICD-10-CM | POA: Diagnosis not present

## 2014-10-06 NOTE — Assessment & Plan Note (Signed)
His right arm and leg are weaker from our last visit. No sign of CVA on exam. ?worsening MS. Improvement in symptoms noted with Prednisone. Will continue 40 mg daily for 1 week and return apt with neuro.

## 2014-10-07 DIAGNOSIS — R488 Other symbolic dysfunctions: Secondary | ICD-10-CM | POA: Diagnosis not present

## 2014-10-07 DIAGNOSIS — R279 Unspecified lack of coordination: Secondary | ICD-10-CM | POA: Diagnosis not present

## 2014-10-07 DIAGNOSIS — M6281 Muscle weakness (generalized): Secondary | ICD-10-CM | POA: Diagnosis not present

## 2014-10-07 DIAGNOSIS — R260 Ataxic gait: Secondary | ICD-10-CM | POA: Diagnosis not present

## 2014-10-07 DIAGNOSIS — R270 Ataxia, unspecified: Secondary | ICD-10-CM | POA: Diagnosis not present

## 2014-10-07 DIAGNOSIS — R269 Unspecified abnormalities of gait and mobility: Secondary | ICD-10-CM | POA: Diagnosis not present

## 2014-10-07 NOTE — Progress Notes (Signed)
Patient ID: [name], male   DOB: 09/21/1940, 74 y.o.   MRN: [id number]     Nursing Home Location:  [place]   Code Status: FULL CODE   Place of Service: ALF (13)  Chief Complaint  Patient presents with  . Acute Visit    increase weakness on the right    HPI:  74 y.o.  Male residing at [facility], [place] [facility].He has a hx of MS with progression, recently moved to [place]. He uses a walker and has right foot drop, currently wearing an AFO.  I was asked to see him today due to worsening right sided weakness. He fell on 10/03/14 due to weakness but did not sustain an injury. He had abrupt onset of worsening weakness on the right, not able to walk to the dining room with the walker and dragging his right foot. His mental status did not change and there were no signs of speech problems, facial asymmetry, or change in vision. The oncall provider was notified and he was placed on Prednisone 40mg  dail.   Review of Systems:  Review of Systems  Constitutional: Positive for activity change. Negative for fever, chills, diaphoresis, appetite change, fatigue and unexpected weight change.  Respiratory: Negative for cough, chest tightness, shortness of breath and wheezing.   Cardiovascular: Positive for leg swelling. Negative for chest pain and palpitations.  Gastrointestinal: Positive for diarrhea. Negative for nausea, vomiting, abdominal pain, blood in stool and abdominal distention.  Genitourinary: Positive for difficulty urinating. Negative for dysuria.  Musculoskeletal: Positive for gait problem. Negative for myalgias, joint swelling and arthralgias.  Neurological: Positive for weakness. Negative for dizziness, seizures, facial asymmetry and speech difficulty.  Psychiatric/Behavioral: Negative for behavioral problems and agitation.       Memory loss    Medications: Patient's Medications  New Prescriptions   No medications on file  Previous  Medications   ASCORBIC ACID (VITAMIN C PO)    Take by mouth daily.     ASPIRIN 81 MG TABLET    Take 81 mg by mouth daily. Take one tablet daily   BIOTIN 5 MG TABS    Take by mouth. Take one tablet daily   CHOLECALCIFEROL (VITAMIN D PO)    Take by mouth daily. Take 2,000 daily   DOCUSATE SODIUM (COLACE) 100 MG CAPSULE    Take 100 mg by mouth daily.   MAGNESIUM HYDROXIDE (MILK OF MAGNESIA) 400 MG/5ML SUSPENSION    Take 15 mLs by mouth daily as needed for mild constipation.   MEMANTINE HCL ER (NAMENDA XR) 28 MG CP24    Take 1 capsule by mouth daily.   NAPROXEN SODIUM (ANAPROX) 220 MG TABLET    Take 220 mg by mouth. Take one every 8 hours as needed for discomfort   OMEGA-3 FATTY ACIDS (FISH OIL PO)    Take 100 mg by mouth daily.   PREDNISONE (DELTASONE) 10 MG TABLET    Take 40 mg by mouth daily with breakfast.   SAW PALMETTO 160 MG CAPSULE    Take 160 mg by mouth. Take one tablet daily   SERTRALINE (ZOLOFT) 50 MG TABLET    Take 50 mg by mouth daily.   SILODOSIN (RAPAFLO) 8 MG CAPS CAPSULE    Take 8 mg by mouth daily with breakfast.   ZOLPIDEM (AMBIEN) 5 MG TABLET    Take 5 mg by mouth at bedtime as needed for sleep.  Modified Medications   No medications on file  Discontinued Medications  No medications on file     Physical Exam:  Filed Vitals:   10/06/14 1156  BP: 120/74  Pulse: 72  Temp: 97.6 F (36.4 C)  Resp: 18  SpO2: 95%    Physical Exam  Constitutional: No distress.  frail  Neck: No JVD present.  Cardiovascular: Normal rate, regular rhythm and intact distal pulses.   No murmur heard. +2 pitting edema BLE  Pulmonary/Chest: Effort normal and breath sounds normal. No respiratory distress. He has no wheezes.  Abdominal: Soft. Bowel sounds are normal. He exhibits no distension and no mass. There is no tenderness. There is no rebound.  Musculoskeletal:  Decreased strength to the right arm and right leg (3/5). Drags his right leg with ambulation.  Neurological: He is alert.  No cranial nerve deficit.  Oriented to self, place, and situation, not date. Able to f/c.    Skin: Skin is warm and dry. He is not diaphoretic. No erythema.  Dry, flaky skin  Psychiatric: Affect and judgment normal.    Labs reviewed/Significant Diagnostic Results:  Basic Metabolic Panel:  Recent Labs  05/13/14 0924  NA 136  K 4.0  CL 102  CO2 24  GLUCOSE 87  BUN 18  CREATININE 0.88  CALCIUM 9.6   Liver Function Tests:  Recent Labs  05/13/14 0924  AST 17  ALT 14  ALKPHOS 58  BILITOT 0.6  PROT 6.7  ALBUMIN 4.4   No results for input(s): LIPASE, AMYLASE in the last 8760 hours. No results for input(s): AMMONIA in the last 8760 hours. CBC:  Recent Labs  05/13/14 0924  WBC 7.7  NEUTROABS 5.2  HGB 16.0  HCT 46.5  MCV 86.6  PLT 174   CBG: No results for input(s): GLUCAP in the last 8760 hours. TSH: No results for input(s): TSH in the last 8760 hours. A1C: Lab Results  Component Value Date   HGBA1C 5.7* 01/13/2013   Lipid Panel:  Recent Labs  05/13/14 0924  CHOL 213*  HDL 54  LDLCALC 135*  TRIG 120  CHOLHDL 3.9     Assessment/Plan  Multiple sclerosis His right arm and leg are weaker from our last visit. No sign of CVA on exam. ?worsening MS. Improvement in symptoms noted with Prednisone. Will continue 40 mg daily for 1 week and return apt with neuro.   Memory loss due to medical condition He could not recall the details of care or the increased weakness over the weekend. Followed by neuro, will defer to them.   This case was discussed with Dr. Mariea Clonts and she is in agreement with the above plan.   Cindi Carbon, ANP [facility] [place] [phone]

## 2014-10-07 NOTE — Assessment & Plan Note (Signed)
He could not recall the details of care or the increased weakness over the weekend. Followed by neuro, will defer to them.

## 2014-10-07 NOTE — Telephone Encounter (Signed)
Ok to setup follow up appt this week with me or NP. -VRP

## 2014-10-08 ENCOUNTER — Ambulatory Visit (INDEPENDENT_AMBULATORY_CARE_PROVIDER_SITE_OTHER): Payer: Medicare Other | Admitting: Diagnostic Neuroimaging

## 2014-10-08 ENCOUNTER — Encounter: Payer: Self-pay | Admitting: Diagnostic Neuroimaging

## 2014-10-08 VITALS — BP 108/66 | HR 67 | Temp 97.2°F | Ht 73.0 in | Wt 181.2 lb

## 2014-10-08 DIAGNOSIS — G35 Multiple sclerosis: Secondary | ICD-10-CM | POA: Diagnosis not present

## 2014-10-08 DIAGNOSIS — W19XXXA Unspecified fall, initial encounter: Secondary | ICD-10-CM

## 2014-10-08 DIAGNOSIS — R29898 Other symptoms and signs involving the musculoskeletal system: Secondary | ICD-10-CM

## 2014-10-08 NOTE — Patient Instructions (Signed)
Continue current plan. Complete prednisone course as planned (1 week).

## 2014-10-08 NOTE — Progress Notes (Signed)
[facility]  PATIENT: [name] DOB: 1940-12-21  REFERRING CLINICIAN:  HISTORY FROM: patient and wife  REASON FOR VISIT: urgent follow up    HISTORICAL  CHIEF COMPLAINT:  Chief Complaint  Patient presents with  . Follow-up    MS exacerbation, increased weakness in right foot and had a fall thursday at [place]:   UPDATE 10/08/14: Since last visit, has moved to [place] and was doing well. Last Thurs was noted to have more weakness in right leg and diff walking. That evening had a fall. Next day went to PCP, and started on prednisone. Sxs had started to improve/resolve before 1st dose of prednisone. Now back to baseline. No triggering factors, infx or stresses.   UPDATE 07/24/14: Planning to transition [facility]. Continued progression of gait diff, more weakness in right leg. Still with short term memory loss. Now with more urinary urgency.   UPDATE 01/22/14: Since last visit, transitioned avonex to plegridy. Also saw Dr. George [facility] neurology/MS) in order to get referral to neuropsych cognitive therapy. Pt was rx'd amprya, but not yet started Herbalist). Overall, continued slow steady progression of symptoms.  UPDATE 07/25/13: Since last visit, continued progression of memory decline, gait difficulty. [name] recently at night, possibly related to [place] usage. Also is readying for transition to [facility] in future; has non-resident resident status.   UPDATE 01/22/13: since last visit patient was hospitalized for upper respiratory infection and fever for 3 days. Patient was discharged 1 week ago. Patient had some exacerbation of MS symptoms. MRI of the brain and cervical spine showed chronic MS plaques with no acute plaques.   PRIOR HPI (10/25/12, Dr. Erling Cruz): 74 year old right-handed white married male with a ten-year history of progressive right arm and leg weakness without hyperreflexia and with atrophy  of his right arm and right leg. Initial evaluation with EMG/NCV and blood studies for neuropathy were unremarkable. Brain MRI 8/08 and 08/11/2007 and cervical MRI 10/21/2006 showed bilateral white matter findings without cord abnormalities.VER was abnormal on the left with delay and BAER was normal. CSF 10/17/2007 showed elevated IgG index and 12 bands. He was started on [name] in March 2009. MRI studies of the brain and cervical spine 11/28/08 and 11/29/08 one year later showed no change. 05/21/09 he developed transient global amnesia after intercourse and had an MRI study of the brain and intracrainal MRA at the [facility] emergency room which was unchanged. Neuropsychological testing for memory loss 07/06/09 showed deficiencies in visual working memory capacity, consistentcy of acquisition and retention of visual information, speed of visual sequencing and procedural learning. 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 He was on Aricept for a while but this caused sleeplessness and he discontinued it. He had one fall while at [facility] when he got up at night. 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 He is no longer playing golf. He is not using his walking sticks, a cane or his short leg braces, a NESS 400 or AFO. He was on ampyra for one month and found benefit, noted by the patient and his Physiological scientist. He also believes he has improvement in memory. Ampyra was discontinued because of increasing prostate pain. He denies any bowel or bladder dysfunction. He takes 5 mg of amitriptyline at night to help with bladder  control, but is discontinuing it. He frequently sits on a pillow because of pain. He saw Dr. Alona Bene 10/26/2011 for cystoscopy and cystometrogram, consistent with MS bladder. He is independent in his activities of daily living. CBC and CMP 06/11/12 were normal. MRI  studies of the brain and cervical spine 10/05/2010 showed chronic demyelinating plaques and a new plaque in the left cerebellum versus 11/28/08. He had a chronic demyelinating plaque at C2 without change versus 11/29/08. He has fatigue and takes a 15-20 minute unscheduled nap during the day. He has nocturia once or twice per night .He underwent his fourth hernia operation 06/16/11. He had cataract surgery on the left 01/2012 and right 2013.Marland Kitchen He notes progression of right hand and arm weakness with difficulty in his handwriting.He is not using his left hand to feed himself, but he notices an awkward position with the fork in his right hand. He is driving limited distances. He tried taking Tecfidera but developed severe nausea and vomiting after going to 240 twice a day. He is on Rapaflow with better control of his bladder. His wife has noted memory problems.He has missed appointments. His father had Alzheimer's disease. 10/25/2012=(MMSE27/30. Clock drawing task 3/4. Animal fluency test 14. Ron Parker index of independence in activities of daily living 6. Bubba [place] instrumental activity of daily living scale 7.Neuropsychiatric inventory=0. Geriatric depression scale 3/15. Falls assessment tool score7. CNS-LS 8).   REVIEW OF SYSTEMS: Full 14 system review of systems performed and notable only for memory loss difficulty urinating prostate difficulty balance walking difficulty.  ALLERGIES: Allergies  Allergen Reactions  . Aricept [Donepezil Hcl]     HOME MEDICATIONS: Outpatient Prescriptions Prior to Visit  Medication Sig Dispense Refill  . Ascorbic Acid (VITAMIN C PO) Take by mouth daily.      Marland Kitchen aspirin 81 MG tablet Take 81 mg by mouth daily. Take one tablet daily    . Biotin 5 MG TABS Take by mouth. Take one tablet daily    . Cholecalciferol (VITAMIN D PO) Take by mouth daily. Take 2,000 daily    . docusate sodium (COLACE) 100 MG capsule Take 100 mg by mouth daily.    . magnesium hydroxide (MILK OF MAGNESIA)  400 MG/5ML suspension Take 15 mLs by mouth daily as needed for mild constipation.    . Memantine HCl ER (NAMENDA XR) 28 MG CP24 Take 1 capsule by mouth daily.    . naproxen sodium (ANAPROX) 220 MG tablet Take 220 mg by mouth. Take one every 8 hours as needed for discomfort    . Omega-3 Fatty Acids (FISH OIL PO) Take 100 mg by mouth daily.    . predniSONE (DELTASONE) 10 MG tablet Take 40 mg by mouth daily with breakfast.    . saw palmetto 160 MG capsule Take 160 mg by mouth. Take one tablet daily    . sertraline (ZOLOFT) 50 MG tablet Take 50 mg by mouth daily.    . silodosin (RAPAFLO) 8 MG CAPS capsule Take 8 mg by mouth daily with breakfast.    . zolpidem (AMBIEN) 5 MG tablet Take 5 mg by mouth at bedtime as needed for sleep.     No facility-administered medications prior to visit.    PAST MEDICAL HISTORY: Past Medical History  Diagnosis Date  . Multiple sclerosis 2008  . BPH (benign prostatic hyperplasia)   . Prostatitis, chronic   . Allergy   . GERD (gastroesophageal reflux disease)   . COPD (chronic obstructive pulmonary disease)   . Adenomatous polyp   .  Hyperlipidemia 07/31/2014  . Generalized weakness 01/12/2013  . 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 Elevated PSA 07/31/2014  . Foot drop, right 2008  . Dementia 2010    PAST SURGICAL HISTORY: Past Surgical History  Procedure Laterality Date  . Hernia repair  [id number]    3 inguinal hernia repairs on left  . Hernia repair  06/16/11    right inguinal hernia repair with mesh   . Prostate surgery  09/29/2008    Dr. Karsten Ro   . Esophagogastroduodenoscopy endoscopy  04/16/2002    Dr. Earle Gell  . Cataract extraction Left 02/06/12  . Cataract extraction Right 2013    FAMILY HISTORY: Family History  Problem Relation Age of Onset  . Depression Mother   . Dementia Father   . Pneumonia Father     SOCIAL HISTORY:  History   Social History  . Marital Status: Married    Spouse Name: Peggy    Number of Children: 0  . Years  of Education: Production manager   Occupational History  . Retired Chief Executive Officer    Social History Main Topics  . Smoking status: Former Smoker    Quit date: 09/20/1975  . Smokeless tobacco: Never Used  . Alcohol Use: No     Comment: Quit: 2003  . Drug Use: No  . Sexual Activity: No   Other Topics Concern  . Not on file   Social History Narrative   Pt lives at [place] and moved in 07/29/2014.   Spouse Peggy    Caffeine: Quit in 2003   Stopped smoking 1977   Exercise none        PHYSICAL EXAM  Filed Vitals:   10/08/14 1506  BP: 108/66  Pulse: 67  Temp: 97.2 F (36.2 C)  TempSrc: Oral  Height: 6\' 1"  (1.854 m)  Weight: 181 lb 3.2 oz (82.192 kg)    Not recorded      Body mass index is 23.91 kg/(m^2).  GENERAL EXAM:  Patient is in no distress  CARDIOVASCULAR:  Regular rate and rhythm, no murmurs, no carotid bruits   NEUROLOGIC:  MENTAL STATUS: awake, alert, language fluent, comprehension intact, naming intact CRANIAL NERVE: pupils equal and reactive to light, visual fields full to confrontation, extraocular muscles intact, SACCADIC BREAKDOWN OF SMOOTH PURSUIT. facial sensation and strength symmetric, uvula midline, shoulder shrug symmetric, tongue midline.  MOTOR: INCREASED TONE IN RUE AND RLE; RIGHT TRICEPS 4, GRIP 4. RIGHT HF 1-2, KE/KF 2-3, RIGHT DF 1-2 (WITH AFO).  SENSORY: normal and symmetric to light touch  COORDINATION: finger-nose-finger, fine finger movements SLOW IN RUE  REFLEXES: BUE 2, KNEES 2, LEFT ANKLE 1; RIGHT ANKLE --> AFO GAIT/STATION: IN WHEELCHAIR.     DIAGNOSTIC DATA (LABS, IMAGING, TESTING) - I reviewed patient records, labs, notes, testing and imaging myself where available.  Lab Results  Component Value Date   WBC 7.7 05/13/2014   HGB 16.0 05/13/2014   HCT 46.5 05/13/2014   MCV 86.6 05/13/2014   PLT 174 05/13/2014      Component Value Date/Time   NA 136 05/13/2014 0924   K 4.0 05/13/2014 0924   CL 102 05/13/2014 0924   CO2 24  05/13/2014 0924   GLUCOSE 87 05/13/2014 0924   BUN 18 05/13/2014 0924   CREATININE 0.88 05/13/2014 0924   CREATININE 0.85 01/15/2013 0519   CALCIUM 9.6 05/13/2014 0924   PROT 6.7 05/13/2014 0924   ALBUMIN 4.4 05/13/2014 0924   AST 17 05/13/2014 0924   ALT 14 05/13/2014 0924  ALKPHOS 58 05/13/2014 0924   BILITOT 0.6 05/13/2014 0924   GFRNONAA 85* 01/15/2013 0519   GFRAA >90 01/15/2013 0519   Lab Results  Component Value Date   CHOL 213* 05/13/2014   HDL 54 05/13/2014   LDLCALC 135* 05/13/2014   TRIG 120 05/13/2014   CHOLHDL 3.9 05/13/2014   Lab Results  Component Value Date   HGBA1C 5.7* 01/13/2013   Lab Results  Component Value Date   CXKGYJEH63 149 01/21/2013   Lab Results  Component Value Date   TSH 0.484 01/13/2013    I reviewed images myself and agree with interpretation. -VRP  01/13/13 MRI brain (w/wo) - Moderate to advanced atrophy. Moderate to advanced chronic white matter changes similar to 2012. This may be due to multiple sclerosis and/or chronic microvascular ischemia. There is no restricted diffusion or abnormal enhancement. No change from the prior study.   01/13/13 MRI cervical (w/wo) - Subtle areas of signal abnormality at C2 on the right and C5-6 are unchanged from prior study. No enhancing lesions. Cervical spondylosis without spinal stenosis.     ASSESSMENT AND PLAN  74 y.o. year old male here with multiple sclerosis (? primary progressive vs. secondary progressive). Also with progressive short term memory loss and cognitive decline. Had transient 24 hours right leg weakness, and fall, may have been a possible MS flare, but somewhat too short for typical flare. Now better.   PLAN:  1. Continue prednisone course (1 week of 40mg  daily) 2. Continue namenda XR  Return in about 4 months (around 02/06/2015).    [name], MD 03/20/6377, 5:88 PM Certified in Neurology, Neurophysiology and Neuroimaging  [facility] [address] [place], [place] [address] [phone]

## 2014-10-09 DIAGNOSIS — R269 Unspecified abnormalities of gait and mobility: Secondary | ICD-10-CM | POA: Diagnosis not present

## 2014-10-09 DIAGNOSIS — R270 Ataxia, unspecified: Secondary | ICD-10-CM | POA: Diagnosis not present

## 2014-10-09 DIAGNOSIS — R279 Unspecified lack of coordination: Secondary | ICD-10-CM | POA: Diagnosis not present

## 2014-10-09 DIAGNOSIS — R260 Ataxic gait: Secondary | ICD-10-CM | POA: Diagnosis not present

## 2014-10-09 DIAGNOSIS — M6281 Muscle weakness (generalized): Secondary | ICD-10-CM | POA: Diagnosis not present

## 2014-10-09 DIAGNOSIS — R488 Other symbolic dysfunctions: Secondary | ICD-10-CM | POA: Diagnosis not present

## 2014-10-13 DIAGNOSIS — R260 Ataxic gait: Secondary | ICD-10-CM | POA: Diagnosis not present

## 2014-10-13 DIAGNOSIS — R269 Unspecified abnormalities of gait and mobility: Secondary | ICD-10-CM | POA: Diagnosis not present

## 2014-10-13 DIAGNOSIS — R279 Unspecified lack of coordination: Secondary | ICD-10-CM | POA: Diagnosis not present

## 2014-10-13 DIAGNOSIS — R488 Other symbolic dysfunctions: Secondary | ICD-10-CM | POA: Diagnosis not present

## 2014-10-13 DIAGNOSIS — M6281 Muscle weakness (generalized): Secondary | ICD-10-CM | POA: Diagnosis not present

## 2014-10-13 DIAGNOSIS — R270 Ataxia, unspecified: Secondary | ICD-10-CM | POA: Diagnosis not present

## 2014-10-14 DIAGNOSIS — R270 Ataxia, unspecified: Secondary | ICD-10-CM | POA: Diagnosis not present

## 2014-10-14 DIAGNOSIS — R279 Unspecified lack of coordination: Secondary | ICD-10-CM | POA: Diagnosis not present

## 2014-10-14 DIAGNOSIS — M6281 Muscle weakness (generalized): Secondary | ICD-10-CM | POA: Diagnosis not present

## 2014-10-14 DIAGNOSIS — R269 Unspecified abnormalities of gait and mobility: Secondary | ICD-10-CM | POA: Diagnosis not present

## 2014-10-14 DIAGNOSIS — R260 Ataxic gait: Secondary | ICD-10-CM | POA: Diagnosis not present

## 2014-10-14 DIAGNOSIS — R488 Other symbolic dysfunctions: Secondary | ICD-10-CM | POA: Diagnosis not present

## 2014-10-16 DIAGNOSIS — R270 Ataxia, unspecified: Secondary | ICD-10-CM | POA: Diagnosis not present

## 2014-10-16 DIAGNOSIS — R269 Unspecified abnormalities of gait and mobility: Secondary | ICD-10-CM | POA: Diagnosis not present

## 2014-10-16 DIAGNOSIS — R260 Ataxic gait: Secondary | ICD-10-CM | POA: Diagnosis not present

## 2014-10-16 DIAGNOSIS — R488 Other symbolic dysfunctions: Secondary | ICD-10-CM | POA: Diagnosis not present

## 2014-10-16 DIAGNOSIS — R279 Unspecified lack of coordination: Secondary | ICD-10-CM | POA: Diagnosis not present

## 2014-10-16 DIAGNOSIS — M6281 Muscle weakness (generalized): Secondary | ICD-10-CM | POA: Diagnosis not present

## 2014-10-17 DIAGNOSIS — R270 Ataxia, unspecified: Secondary | ICD-10-CM | POA: Diagnosis not present

## 2014-10-17 DIAGNOSIS — M6281 Muscle weakness (generalized): Secondary | ICD-10-CM | POA: Diagnosis not present

## 2014-10-17 DIAGNOSIS — R488 Other symbolic dysfunctions: Secondary | ICD-10-CM | POA: Diagnosis not present

## 2014-10-17 DIAGNOSIS — R279 Unspecified lack of coordination: Secondary | ICD-10-CM | POA: Diagnosis not present

## 2014-10-17 DIAGNOSIS — R269 Unspecified abnormalities of gait and mobility: Secondary | ICD-10-CM | POA: Diagnosis not present

## 2014-10-17 DIAGNOSIS — R260 Ataxic gait: Secondary | ICD-10-CM | POA: Diagnosis not present

## 2014-10-20 DIAGNOSIS — R413 Other amnesia: Secondary | ICD-10-CM | POA: Diagnosis not present

## 2014-10-20 DIAGNOSIS — R296 Repeated falls: Secondary | ICD-10-CM | POA: Diagnosis not present

## 2014-10-20 DIAGNOSIS — G35 Multiple sclerosis: Secondary | ICD-10-CM | POA: Diagnosis not present

## 2014-10-20 DIAGNOSIS — M6281 Muscle weakness (generalized): Secondary | ICD-10-CM | POA: Diagnosis not present

## 2014-10-20 DIAGNOSIS — R270 Ataxia, unspecified: Secondary | ICD-10-CM | POA: Diagnosis not present

## 2014-10-20 DIAGNOSIS — R5383 Other fatigue: Secondary | ICD-10-CM | POA: Diagnosis not present

## 2014-10-20 DIAGNOSIS — R260 Ataxic gait: Secondary | ICD-10-CM | POA: Diagnosis not present

## 2014-10-20 DIAGNOSIS — R488 Other symbolic dysfunctions: Secondary | ICD-10-CM | POA: Diagnosis not present

## 2014-10-20 DIAGNOSIS — F0280 Dementia in other diseases classified elsewhere without behavioral disturbance: Secondary | ICD-10-CM | POA: Diagnosis not present

## 2014-10-20 DIAGNOSIS — G8191 Hemiplegia, unspecified affecting right dominant side: Secondary | ICD-10-CM | POA: Diagnosis not present

## 2014-10-20 DIAGNOSIS — R279 Unspecified lack of coordination: Secondary | ICD-10-CM | POA: Diagnosis not present

## 2014-10-20 DIAGNOSIS — R269 Unspecified abnormalities of gait and mobility: Secondary | ICD-10-CM | POA: Diagnosis not present

## 2014-10-20 DIAGNOSIS — G3281 Cerebellar ataxia in diseases classified elsewhere: Secondary | ICD-10-CM | POA: Diagnosis not present

## 2014-10-21 DIAGNOSIS — R279 Unspecified lack of coordination: Secondary | ICD-10-CM | POA: Diagnosis not present

## 2014-10-21 DIAGNOSIS — R260 Ataxic gait: Secondary | ICD-10-CM | POA: Diagnosis not present

## 2014-10-21 DIAGNOSIS — M6281 Muscle weakness (generalized): Secondary | ICD-10-CM | POA: Diagnosis not present

## 2014-10-21 DIAGNOSIS — R269 Unspecified abnormalities of gait and mobility: Secondary | ICD-10-CM | POA: Diagnosis not present

## 2014-10-21 DIAGNOSIS — R270 Ataxia, unspecified: Secondary | ICD-10-CM | POA: Diagnosis not present

## 2014-10-21 DIAGNOSIS — R488 Other symbolic dysfunctions: Secondary | ICD-10-CM | POA: Diagnosis not present

## 2014-10-22 ENCOUNTER — Encounter: Payer: Self-pay | Admitting: Nurse Practitioner

## 2014-10-22 ENCOUNTER — Non-Acute Institutional Stay: Payer: Medicare Other | Admitting: Nurse Practitioner

## 2014-10-22 VITALS — BP 142/82 | HR 76 | Temp 97.9°F | Wt 179.0 lb

## 2014-10-22 DIAGNOSIS — L219 Seborrheic dermatitis, unspecified: Secondary | ICD-10-CM | POA: Diagnosis not present

## 2014-10-22 NOTE — Progress Notes (Signed)
Patient ID: [name], male   DOB: 10/04/1940, 74 y.o.   MRN: [id number]    Nursing Home Location:  [place] of Service: Clinic (12)  PCP: Estill Dooms, MD  Allergies  Allergen Reactions  . Aricept [Donepezil Hcl]     Chief Complaint  Patient presents with  . Rash    on face    HPI:  Patient is a 74 y.o. male seen today at [facility] who presents today with chief complaint of rash on the face. Patient has red, flaky skin on cheeks, nose and forehead.  He first noted the rash yesterday and has used a facewash for it he has but he doesn't remember what it was.  He reports that the rash is not burning, painful or itching.   He says it is not bothering him but is causing his face to be very red.    Review of Systems:  Review of Systems  Constitutional: Negative for fever, chills and fatigue.  HENT: Negative.   Respiratory: Negative for cough and shortness of breath.   Cardiovascular: Negative for chest pain, palpitations and leg swelling.  Gastrointestinal: Negative for diarrhea, constipation and blood in stool.  Genitourinary: Positive for urgency, frequency and difficulty urinating.       Frequency and urgency are his baseline   Skin: Positive for rash.  Neurological: Negative for dizziness, light-headedness and headaches.  Psychiatric/Behavioral: Positive for agitation.    Past Medical History  Diagnosis Date  . Multiple sclerosis 2008  . BPH (benign prostatic hyperplasia)   . Prostatitis, chronic   . Allergy   . GERD (gastroesophageal reflux disease)   . COPD (chronic obstructive pulmonary disease)   . Adenomatous polyp   . Hyperlipidemia 07/31/2014  . Generalized weakness 01/12/2013  . 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 Elevated PSA 07/31/2014  . Foot drop, right 2008  . Dementia 2010   Past Surgical History  Procedure Laterality Date  . Hernia repair  [id number]    3 inguinal hernia repairs on left  .  Hernia repair  06/16/11    right inguinal hernia repair with mesh   . Prostate surgery  09/29/2008    Dr. Karsten Ro   . Esophagogastroduodenoscopy endoscopy  04/16/2002    Dr. Earle Gell  . Cataract extraction Left 02/06/12  . Cataract extraction Right 2013   Social History:   reports that he quit smoking about 39 years ago. He has never used smokeless tobacco. He reports that he does not drink alcohol or use illicit drugs.  Family History  Problem Relation Age of Onset  . Depression Mother   . Dementia Father   . Pneumonia Father     Medications: Patient's Medications  New Prescriptions   No medications on file  Previous Medications   ASCORBIC ACID (VITAMIN C PO)    Take by mouth daily.     ASPIRIN 81 MG TABLET    Take 81 mg by mouth daily. Take one tablet daily   BIOTIN 5 MG TABS    Take by mouth. Take one tablet daily   CETIRIZINE (ZYRTEC) 10 MG TABLET    Take 10 mg by mouth daily. Take one every 24 hours as needed for itching   CHOLECALCIFEROL (VITAMIN D PO)    Take by mouth daily. Take 2,000 daily   DOCUSATE SODIUM (COLACE) 100 MG CAPSULE    Take 100 mg by mouth daily.   MAGNESIUM HYDROXIDE (MILK OF MAGNESIA) 400 MG/5ML SUSPENSION  Take 15 mLs by mouth daily as needed for mild constipation.   MEMANTINE HCL ER (NAMENDA XR) 28 MG CP24    Take 1 capsule by mouth daily.   NAPROXEN SODIUM (ANAPROX) 220 MG TABLET    Take 220 mg by mouth. Take one every 8 hours as needed for discomfort   OMEGA-3 FATTY ACIDS (FISH OIL PO)    Take 100 mg by mouth daily.   SAW PALMETTO 160 MG CAPSULE    Take 160 mg by mouth. Take one tablet daily   SERTRALINE (ZOLOFT) 50 MG TABLET    Take 50 mg by mouth daily. Take 1 1/2 tablets =75mg  daily   SILODOSIN (RAPAFLO) 8 MG CAPS CAPSULE    Take 8 mg by mouth daily with breakfast.   ZOLPIDEM (AMBIEN) 5 MG TABLET    Take 5 mg by mouth at bedtime as needed for sleep.  Modified Medications   No medications on file  Discontinued Medications   PREDNISONE  (DELTASONE) 20 MG TABLET         Physical Exam: Filed Vitals:   10/22/14 1544  BP: 142/82  Pulse: 76  Temp: 97.9 F (36.6 C)  TempSrc: Oral  Weight: 179 lb (81.194 kg)    Physical Exam  Constitutional: He appears well-developed and well-nourished. He appears distressed.  HENT:  Head: Normocephalic and atraumatic.  Cardiovascular: Normal rate, regular rhythm and normal heart sounds.   Pulmonary/Chest: Effort normal and breath sounds normal.  Abdominal: Soft. There is no tenderness. There is no guarding.  Neurological: He is alert.  Skin: Skin is warm and dry. Rash noted.  Dry, flaky, reddened skin on the nose, cheeks, and forehead.      Labs reviewed: Basic Metabolic Panel:  Recent Labs  05/13/14 0924  NA 136  K 4.0  CL 102  CO2 24  GLUCOSE 87  BUN 18  CREATININE 0.88  CALCIUM 9.6   Liver Function Tests:  Recent Labs  05/13/14 0924  AST 17  ALT 14  ALKPHOS 58  BILITOT 0.6  PROT 6.7  ALBUMIN 4.4   No results for input(s): LIPASE, AMYLASE in the last 8760 hours. No results for input(s): AMMONIA in the last 8760 hours. CBC:  Recent Labs  05/13/14 0924  WBC 7.7  NEUTROABS 5.2  HGB 16.0  HCT 46.5  MCV 86.6  PLT 174   TSH: No results for input(s): TSH in the last 8760 hours. A1C: Lab Results  Component Value Date   HGBA1C 5.7* 01/13/2013   Lipid Panel:  Recent Labs  05/13/14 0924  CHOL 213*  HDL 54  LDLCALC 135*  TRIG 120  CHOLHDL 3.9     Assessment/Plan 1. Seborrheic dermatitis - Rx for ketoconazole 2% cream to use on the affected areas of the face twice daily for 2 weeks.  Instructed patient to make an appointment if symptoms worsen.  Can follow up as needed.

## 2014-10-23 DIAGNOSIS — R279 Unspecified lack of coordination: Secondary | ICD-10-CM | POA: Diagnosis not present

## 2014-10-23 DIAGNOSIS — R260 Ataxic gait: Secondary | ICD-10-CM | POA: Diagnosis not present

## 2014-10-23 DIAGNOSIS — R270 Ataxia, unspecified: Secondary | ICD-10-CM | POA: Diagnosis not present

## 2014-10-23 DIAGNOSIS — R269 Unspecified abnormalities of gait and mobility: Secondary | ICD-10-CM | POA: Diagnosis not present

## 2014-10-23 DIAGNOSIS — M6281 Muscle weakness (generalized): Secondary | ICD-10-CM | POA: Diagnosis not present

## 2014-10-23 DIAGNOSIS — R488 Other symbolic dysfunctions: Secondary | ICD-10-CM | POA: Diagnosis not present

## 2014-10-24 DIAGNOSIS — G3281 Cerebellar ataxia in diseases classified elsewhere: Secondary | ICD-10-CM | POA: Diagnosis not present

## 2014-10-24 DIAGNOSIS — G35 Multiple sclerosis: Secondary | ICD-10-CM | POA: Diagnosis not present

## 2014-10-24 DIAGNOSIS — M6281 Muscle weakness (generalized): Secondary | ICD-10-CM | POA: Diagnosis not present

## 2014-10-24 DIAGNOSIS — R270 Ataxia, unspecified: Secondary | ICD-10-CM | POA: Diagnosis not present

## 2014-10-24 DIAGNOSIS — R260 Ataxic gait: Secondary | ICD-10-CM | POA: Diagnosis not present

## 2014-10-24 DIAGNOSIS — R413 Other amnesia: Secondary | ICD-10-CM | POA: Diagnosis not present

## 2014-10-24 DIAGNOSIS — R296 Repeated falls: Secondary | ICD-10-CM | POA: Diagnosis not present

## 2014-10-24 DIAGNOSIS — G8191 Hemiplegia, unspecified affecting right dominant side: Secondary | ICD-10-CM | POA: Diagnosis not present

## 2014-10-28 DIAGNOSIS — G35 Multiple sclerosis: Secondary | ICD-10-CM | POA: Diagnosis not present

## 2014-10-28 DIAGNOSIS — G3281 Cerebellar ataxia in diseases classified elsewhere: Secondary | ICD-10-CM | POA: Diagnosis not present

## 2014-10-28 DIAGNOSIS — R270 Ataxia, unspecified: Secondary | ICD-10-CM | POA: Diagnosis not present

## 2014-10-28 DIAGNOSIS — G8191 Hemiplegia, unspecified affecting right dominant side: Secondary | ICD-10-CM | POA: Diagnosis not present

## 2014-10-28 DIAGNOSIS — R260 Ataxic gait: Secondary | ICD-10-CM | POA: Diagnosis not present

## 2014-10-28 DIAGNOSIS — M6281 Muscle weakness (generalized): Secondary | ICD-10-CM | POA: Diagnosis not present

## 2014-10-30 DIAGNOSIS — R270 Ataxia, unspecified: Secondary | ICD-10-CM | POA: Diagnosis not present

## 2014-10-30 DIAGNOSIS — G8191 Hemiplegia, unspecified affecting right dominant side: Secondary | ICD-10-CM | POA: Diagnosis not present

## 2014-10-30 DIAGNOSIS — M6281 Muscle weakness (generalized): Secondary | ICD-10-CM | POA: Diagnosis not present

## 2014-10-30 DIAGNOSIS — R260 Ataxic gait: Secondary | ICD-10-CM | POA: Diagnosis not present

## 2014-10-30 DIAGNOSIS — G3281 Cerebellar ataxia in diseases classified elsewhere: Secondary | ICD-10-CM | POA: Diagnosis not present

## 2014-10-30 DIAGNOSIS — G35 Multiple sclerosis: Secondary | ICD-10-CM | POA: Diagnosis not present

## 2014-10-31 DIAGNOSIS — M6281 Muscle weakness (generalized): Secondary | ICD-10-CM | POA: Diagnosis not present

## 2014-10-31 DIAGNOSIS — R270 Ataxia, unspecified: Secondary | ICD-10-CM | POA: Diagnosis not present

## 2014-10-31 DIAGNOSIS — G3281 Cerebellar ataxia in diseases classified elsewhere: Secondary | ICD-10-CM | POA: Diagnosis not present

## 2014-10-31 DIAGNOSIS — R260 Ataxic gait: Secondary | ICD-10-CM | POA: Diagnosis not present

## 2014-10-31 DIAGNOSIS — G35 Multiple sclerosis: Secondary | ICD-10-CM | POA: Diagnosis not present

## 2014-10-31 DIAGNOSIS — G8191 Hemiplegia, unspecified affecting right dominant side: Secondary | ICD-10-CM | POA: Diagnosis not present

## 2014-11-02 DIAGNOSIS — R260 Ataxic gait: Secondary | ICD-10-CM | POA: Diagnosis not present

## 2014-11-02 DIAGNOSIS — R270 Ataxia, unspecified: Secondary | ICD-10-CM | POA: Diagnosis not present

## 2014-11-02 DIAGNOSIS — G8191 Hemiplegia, unspecified affecting right dominant side: Secondary | ICD-10-CM | POA: Diagnosis not present

## 2014-11-02 DIAGNOSIS — G3281 Cerebellar ataxia in diseases classified elsewhere: Secondary | ICD-10-CM | POA: Diagnosis not present

## 2014-11-02 DIAGNOSIS — G35 Multiple sclerosis: Secondary | ICD-10-CM | POA: Diagnosis not present

## 2014-11-02 DIAGNOSIS — M6281 Muscle weakness (generalized): Secondary | ICD-10-CM | POA: Diagnosis not present

## 2014-11-04 ENCOUNTER — Ambulatory Visit: Payer: Medicare Other | Admitting: Internal Medicine

## 2014-11-04 DIAGNOSIS — R270 Ataxia, unspecified: Secondary | ICD-10-CM | POA: Diagnosis not present

## 2014-11-04 DIAGNOSIS — M6281 Muscle weakness (generalized): Secondary | ICD-10-CM | POA: Diagnosis not present

## 2014-11-04 DIAGNOSIS — G8191 Hemiplegia, unspecified affecting right dominant side: Secondary | ICD-10-CM | POA: Diagnosis not present

## 2014-11-04 DIAGNOSIS — G3281 Cerebellar ataxia in diseases classified elsewhere: Secondary | ICD-10-CM | POA: Diagnosis not present

## 2014-11-04 DIAGNOSIS — R260 Ataxic gait: Secondary | ICD-10-CM | POA: Diagnosis not present

## 2014-11-04 DIAGNOSIS — G35 Multiple sclerosis: Secondary | ICD-10-CM | POA: Diagnosis not present

## 2014-11-06 DIAGNOSIS — G3281 Cerebellar ataxia in diseases classified elsewhere: Secondary | ICD-10-CM | POA: Diagnosis not present

## 2014-11-06 DIAGNOSIS — G35 Multiple sclerosis: Secondary | ICD-10-CM | POA: Diagnosis not present

## 2014-11-06 DIAGNOSIS — R270 Ataxia, unspecified: Secondary | ICD-10-CM | POA: Diagnosis not present

## 2014-11-06 DIAGNOSIS — G8191 Hemiplegia, unspecified affecting right dominant side: Secondary | ICD-10-CM | POA: Diagnosis not present

## 2014-11-06 DIAGNOSIS — M6281 Muscle weakness (generalized): Secondary | ICD-10-CM | POA: Diagnosis not present

## 2014-11-06 DIAGNOSIS — R260 Ataxic gait: Secondary | ICD-10-CM | POA: Diagnosis not present

## 2014-11-11 DIAGNOSIS — R260 Ataxic gait: Secondary | ICD-10-CM | POA: Diagnosis not present

## 2014-11-11 DIAGNOSIS — G3281 Cerebellar ataxia in diseases classified elsewhere: Secondary | ICD-10-CM | POA: Diagnosis not present

## 2014-11-11 DIAGNOSIS — R270 Ataxia, unspecified: Secondary | ICD-10-CM | POA: Diagnosis not present

## 2014-11-11 DIAGNOSIS — G8191 Hemiplegia, unspecified affecting right dominant side: Secondary | ICD-10-CM | POA: Diagnosis not present

## 2014-11-11 DIAGNOSIS — M6281 Muscle weakness (generalized): Secondary | ICD-10-CM | POA: Diagnosis not present

## 2014-11-11 DIAGNOSIS — G35 Multiple sclerosis: Secondary | ICD-10-CM | POA: Diagnosis not present

## 2014-11-13 DIAGNOSIS — R270 Ataxia, unspecified: Secondary | ICD-10-CM | POA: Diagnosis not present

## 2014-11-13 DIAGNOSIS — G3281 Cerebellar ataxia in diseases classified elsewhere: Secondary | ICD-10-CM | POA: Diagnosis not present

## 2014-11-13 DIAGNOSIS — M6281 Muscle weakness (generalized): Secondary | ICD-10-CM | POA: Diagnosis not present

## 2014-11-13 DIAGNOSIS — G8191 Hemiplegia, unspecified affecting right dominant side: Secondary | ICD-10-CM | POA: Diagnosis not present

## 2014-11-13 DIAGNOSIS — G35 Multiple sclerosis: Secondary | ICD-10-CM | POA: Diagnosis not present

## 2014-11-13 DIAGNOSIS — R260 Ataxic gait: Secondary | ICD-10-CM | POA: Diagnosis not present

## 2014-11-14 DIAGNOSIS — G35 Multiple sclerosis: Secondary | ICD-10-CM | POA: Diagnosis not present

## 2014-11-14 DIAGNOSIS — R260 Ataxic gait: Secondary | ICD-10-CM | POA: Diagnosis not present

## 2014-11-14 DIAGNOSIS — R270 Ataxia, unspecified: Secondary | ICD-10-CM | POA: Diagnosis not present

## 2014-11-14 DIAGNOSIS — G3281 Cerebellar ataxia in diseases classified elsewhere: Secondary | ICD-10-CM | POA: Diagnosis not present

## 2014-11-14 DIAGNOSIS — M6281 Muscle weakness (generalized): Secondary | ICD-10-CM | POA: Diagnosis not present

## 2014-11-14 DIAGNOSIS — G8191 Hemiplegia, unspecified affecting right dominant side: Secondary | ICD-10-CM | POA: Diagnosis not present

## 2014-11-26 ENCOUNTER — Encounter: Payer: Medicare Other | Admitting: Nurse Practitioner

## 2014-12-09 ENCOUNTER — Telehealth: Payer: Self-pay | Admitting: Internal Medicine

## 2014-12-09 NOTE — Telephone Encounter (Signed)
Called pt regarding her Flu Shot for 2015, left message..[name]

## 2014-12-23 ENCOUNTER — Ambulatory Visit (INDEPENDENT_AMBULATORY_CARE_PROVIDER_SITE_OTHER): Payer: Medicare Other | Admitting: Diagnostic Neuroimaging

## 2014-12-23 ENCOUNTER — Encounter: Payer: Self-pay | Admitting: Diagnostic Neuroimaging

## 2014-12-23 VITALS — BP 100/67 | HR 74 | Ht 72.0 in | Wt 180.6 lb

## 2014-12-23 DIAGNOSIS — G35 Multiple sclerosis: Secondary | ICD-10-CM | POA: Diagnosis not present

## 2014-12-23 NOTE — Progress Notes (Signed)
[facility]  PATIENT: [name] DOB: Aug 29, 1941  REFERRING CLINICIAN:  HISTORY FROM: patient and wife  REASON FOR VISIT: urgent follow up    HISTORICAL  CHIEF COMPLAINT:  Chief Complaint  Patient presents with  . Follow-up    Multiple Sclerosis    HISTORY OF PRESENT ILLNESS:   UPDATE 12/23/14: Since last visit, no new events. Stable, and happy living at [facility]. Uses walker. Has new right AFO. Will establish with Dr. Mariea Clonts as PCP soon.  UPDATE 10/08/14: Since last visit, has moved to [place] and was doing well. Last Thurs was noted to have more weakness in right leg and diff walking. That evening had a fall. Next day went to PCP, and started on prednisone. Sxs had started to improve/resolve before 1st dose of prednisone. Now back to baseline. No triggering factors, infx or stresses.   UPDATE 07/24/14: Planning to transition [facility]. Continued progression of gait diff, more weakness in right leg. Still with short term memory loss. Now with more urinary urgency.   UPDATE 01/22/14: Since last visit, transitioned avonex to plegridy. Also saw Dr. George Hugh [facility] neurology/MS) in order to get referral to neuropsych cognitive therapy. Pt was rx'd amprya, but not yet started Herbalist). Overall, continued slow steady progression of symptoms.  UPDATE 07/25/13: Since last visit, continued progression of memory decline, gait difficulty. [name] recently at night, possibly related to [place] usage. Also is readying for transition to [facility] in future; has non-resident resident status.   UPDATE 01/22/13: since last visit patient was hospitalized for upper respiratory infection and fever for 3 days. Patient was discharged 1 week ago. Patient had some exacerbation of MS symptoms. MRI of the brain and cervical spine showed chronic MS plaques with no acute plaques.   PRIOR HPI (10/25/12, Dr. Erling Cruz): 74 year old right-handed white married male with a  ten-year history of progressive right arm and leg weakness without hyperreflexia and with atrophy of his right arm and right leg. Initial evaluation with EMG/NCV and blood studies for neuropathy were unremarkable. Brain MRI 8/08 and 08/11/2007 and cervical MRI 10/21/2006 showed bilateral white matter findings without cord abnormalities.VER was abnormal on the left with delay and BAER was normal. CSF 10/17/2007 showed elevated IgG index and 12 bands. He was started on [name] in March 2009. MRI studies of the brain and cervical spine 11/28/08 and 11/29/08 one year later showed no change. 05/21/09 he developed transient global amnesia after intercourse and had an MRI study of the brain and intracrainal MRA at the [facility] emergency room which was unchanged. Neuropsychological testing for memory loss 07/06/09 showed deficiencies in visual working memory capacity, consistentcy of acquisition and retention of visual information, speed of visual sequencing and procedural learning. 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 He was on Aricept for a while but this caused sleeplessness and he discontinued it. He had one fall while at [facility] when he got up at night. 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 He is no longer playing golf. He is not using his walking sticks, a cane or his short leg braces, a NESS 400 or AFO. He was on ampyra for one month and found benefit, noted by the patient and his Physiological scientist. He also believes he has improvement in memory. Ampyra was discontinued because of increasing prostate pain. He denies  any bowel or bladder dysfunction. He takes 5 mg of amitriptyline at night to help with bladder control, but is discontinuing it. He frequently sits on a pillow because of pain. He saw Dr. Alona Bene 10/26/2011 for cystoscopy and cystometrogram, consistent with MS  bladder. He is independent in his activities of daily living. CBC and CMP 06/11/12 were normal. MRI studies of the brain and cervical spine 10/05/2010 showed chronic demyelinating plaques and a new plaque in the left cerebellum versus 11/28/08. He had a chronic demyelinating plaque at C2 without change versus 11/29/08. He has fatigue and takes a 15-20 minute unscheduled nap during the day. He has nocturia once or twice per night .He underwent his fourth hernia operation 06/16/11. He had cataract surgery on the left 01/2012 and right 2013.Marland Kitchen He notes progression of right hand and arm weakness with difficulty in his handwriting.He is not using his left hand to feed himself, but he notices an awkward position with the fork in his right hand. He is driving limited distances. He tried taking Tecfidera but developed severe nausea and vomiting after going to 240 twice a day. He is on Rapaflow with better control of his bladder. His wife has noted memory problems.He has missed appointments. His father had Alzheimer's disease. 10/25/2012=(MMSE27/30. Clock drawing task 3/4. Animal fluency test 14. Ron Parker index of independence in activities of daily living 6. Bubba Camp instrumental activity of daily living scale 7.Neuropsychiatric inventory=0. Geriatric depression scale 3/15. Falls assessment tool score7. CNS-LS 8).   REVIEW OF SYSTEMS: Full 14 system review of systems performed and notable only for memory loss.   ALLERGIES: Allergies  Allergen Reactions  . Aricept [Donepezil Hcl]     HOME MEDICATIONS: Outpatient Prescriptions Prior to Visit  Medication Sig Dispense Refill  . Ascorbic Acid (VITAMIN C PO) Take by mouth daily.      Marland Kitchen aspirin 81 MG tablet Take 81 mg by mouth daily. Take one tablet daily    . Biotin 5 MG TABS Take by mouth. Take one tablet daily    . Cholecalciferol (VITAMIN D PO) Take by mouth daily. Take 2,000 daily    . docusate sodium (COLACE) 100 MG capsule Take 100 mg by mouth daily.    .  magnesium hydroxide (MILK OF MAGNESIA) 400 MG/5ML suspension Take 15 mLs by mouth daily as needed for mild constipation.    . Memantine HCl ER (NAMENDA XR) 28 MG CP24 Take 1 capsule by mouth daily.    . naproxen sodium (ANAPROX) 220 MG tablet Take 220 mg by mouth. Take one every 8 hours as needed for discomfort    . Omega-3 Fatty Acids (FISH OIL PO) Take 100 mg by mouth daily.    . saw palmetto 160 MG capsule Take 160 mg by mouth. Take one tablet daily    . sertraline (ZOLOFT) 50 MG tablet Take 50 mg by mouth daily. Take 1 1/2 tablets =75mg  daily    . silodosin (RAPAFLO) 8 MG CAPS capsule Take 8 mg by mouth daily with breakfast.    . traMADol (ULTRAM) 50 MG tablet Take one table twice daily as needed for pain    . zolpidem (AMBIEN) 5 MG tablet Take 5 mg by mouth at bedtime as needed for sleep.     No facility-administered medications prior to visit.    PAST MEDICAL HISTORY: Past Medical History  Diagnosis Date  . Multiple sclerosis 2008  . BPH (benign prostatic hyperplasia)   . Prostatitis, chronic   . Allergy   .  GERD (gastroesophageal reflux disease)   . COPD (chronic obstructive pulmonary disease)   . Adenomatous polyp   . Hyperlipidemia 07/31/2014  . Generalized weakness 01/12/2013  . 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 Elevated PSA 07/31/2014  . Foot drop, right 2008  . Dementia 2010    PAST SURGICAL HISTORY: Past Surgical History  Procedure Laterality Date  . Hernia repair  [id number]    3 inguinal hernia repairs on left  . Hernia repair  06/16/11    right inguinal hernia repair with mesh   . Prostate surgery  09/29/2008    Dr. Karsten Ro   . Esophagogastroduodenoscopy endoscopy  04/16/2002    Dr. Earle Gell  . Cataract extraction Left 02/06/12  . Cataract extraction Right 2013    FAMILY HISTORY: Family History  Problem Relation Age of Onset  . Depression Mother   . Dementia Father   . Pneumonia Father     SOCIAL HISTORY:  History   Social History  . Marital  Status: Married    Spouse Name: Vickii Chafe  . Number of Children: 0  . Years of Education: Law School   Occupational History  . Retired Chief Executive Officer    Social History Main Topics  . Smoking status: Former Smoker    Quit date: 09/20/1975  . Smokeless tobacco: Never Used  . Alcohol Use: No     Comment: Quit: 2003  . Drug Use: No  . Sexual Activity: No   Other Topics Concern  . Not on file   Social History Narrative   Pt lives at [place] and moved in 07/29/2014.   Spouse Peggy    Caffeine: Quit in 2003   Stopped smoking 1977   Exercise none        PHYSICAL EXAM  Filed Vitals:   12/23/14 1322  BP: 100/67  Pulse: 74  Height: 6' (1.829 m)  Weight: 180 lb 9.6 oz (81.92 kg)    Not recorded      Body mass index is 24.49 kg/(m^2).  GENERAL EXAM:  Patient is in no distress  CARDIOVASCULAR:  Regular rate and rhythm, no murmurs, no carotid bruits   NEUROLOGIC:  MENTAL STATUS: awake, alert, language fluent, comprehension intact, naming intact CRANIAL NERVE: pupils equal and reactive to light, visual fields full to confrontation, extraocular muscles intact, SACCADIC BREAKDOWN OF SMOOTH PURSUIT. facial sensation and strength symmetric, uvula midline, shoulder shrug symmetric, tongue midline.  MOTOR: INCREASED TONE IN RUE AND RLE; RIGHT TRICEPS 4, GRIP 4. RIGHT HF 1-2, KE/KF 2-3, RIGHT DF 1-2 (WITH AFO).  SENSORY: normal and symmetric to light touch  COORDINATION: finger-nose-finger, fine finger movements SLOW IN RUE  REFLEXES: BUE 2, KNEES 2, LEFT ANKLE 1; RIGHT ANKLE --> AFO GAIT/STATION: IN WHEELCHAIR.     DIAGNOSTIC DATA (LABS, IMAGING, TESTING) - I reviewed patient records, labs, notes, testing and imaging myself where available.  Lab Results  Component Value Date   WBC 7.7 05/13/2014   HGB 16.0 05/13/2014   HCT 46.5 05/13/2014   MCV 86.6 05/13/2014   PLT 174 05/13/2014      Component Value Date/Time   NA 140 09/09/2014   NA 136 05/13/2014 0924   K 3.6  09/09/2014   CL 102 05/13/2014 0924   CO2 24 05/13/2014 0924   GLUCOSE 87 05/13/2014 0924   BUN 14 09/09/2014   BUN 18 05/13/2014 0924   CREATININE 0.8 09/09/2014   CREATININE 0.88 05/13/2014 0924   CREATININE 0.85 01/15/2013 0519   CALCIUM 9.6 05/13/2014 0924   PROT  6.7 05/13/2014 0924   ALBUMIN 4.4 05/13/2014 0924   AST 17 05/13/2014 0924   ALT 14 05/13/2014 0924   ALKPHOS 58 05/13/2014 0924   BILITOT 0.6 05/13/2014 0924   GFRNONAA 85* 01/15/2013 0519   GFRAA >90 01/15/2013 0519   Lab Results  Component Value Date   CHOL 213* 05/13/2014   HDL 54 05/13/2014   LDLCALC 135* 05/13/2014   TRIG 120 05/13/2014   CHOLHDL 3.9 05/13/2014   Lab Results  Component Value Date   HGBA1C 5.7* 01/13/2013   Lab Results  Component Value Date   JOACZYSA63 016 01/21/2013   Lab Results  Component Value Date   TSH 0.484 01/13/2013    I reviewed images myself and agree with interpretation. -VRP  01/13/13 MRI brain (w/wo) - Moderate to advanced atrophy. Moderate to advanced chronic white matter changes similar to 2012. This may be due to multiple sclerosis and/or chronic microvascular ischemia. There is no restricted diffusion or abnormal enhancement. No change from the prior study.   01/13/13 MRI cervical (w/wo) - Subtle areas of signal abnormality at C2 on the right and C5-6 are unchanged from prior study. No enhancing lesions. Cervical spondylosis without spinal stenosis.     ASSESSMENT AND PLAN  74 y.o. year old male here with multiple sclerosis (? primary progressive vs. secondary progressive). Also with progressive short term memory loss and cognitive decline. Previously on plegridy, but now off therapy since not much benefit in poss primary progressive dz. Now stable at [facility].  PLAN:  - Change namenda XR to memantine 10mg  BID - Return if symptoms worsen or fail to improve, for return to PCP.    [name], MD 0/09/930, 3:55 PM Certified in Neurology,  Neurophysiology and Neuroimaging  [facility] [address], [place],  [address] [phone]

## 2015-01-13 DIAGNOSIS — N319 Neuromuscular dysfunction of bladder, unspecified: Secondary | ICD-10-CM | POA: Diagnosis not present

## 2015-01-13 DIAGNOSIS — G35 Multiple sclerosis: Secondary | ICD-10-CM | POA: Diagnosis not present

## 2015-02-09 ENCOUNTER — Encounter: Payer: Self-pay | Admitting: Internal Medicine

## 2015-02-10 ENCOUNTER — Non-Acute Institutional Stay: Payer: Medicare Other | Admitting: Internal Medicine

## 2015-02-10 ENCOUNTER — Encounter: Payer: Self-pay | Admitting: Internal Medicine

## 2015-02-10 VITALS — BP 104/60 | HR 60 | Temp 97.7°F | Wt 178.0 lb

## 2015-02-10 DIAGNOSIS — N319 Neuromuscular dysfunction of bladder, unspecified: Secondary | ICD-10-CM

## 2015-02-10 DIAGNOSIS — Z23 Encounter for immunization: Secondary | ICD-10-CM | POA: Diagnosis not present

## 2015-02-10 DIAGNOSIS — G35 Multiple sclerosis: Secondary | ICD-10-CM | POA: Diagnosis not present

## 2015-02-10 DIAGNOSIS — N400 Enlarged prostate without lower urinary tract symptoms: Secondary | ICD-10-CM

## 2015-02-10 DIAGNOSIS — R413 Other amnesia: Secondary | ICD-10-CM | POA: Diagnosis not present

## 2015-02-10 DIAGNOSIS — R269 Unspecified abnormalities of gait and mobility: Secondary | ICD-10-CM

## 2015-02-10 DIAGNOSIS — E785 Hyperlipidemia, unspecified: Secondary | ICD-10-CM | POA: Diagnosis not present

## 2015-02-10 NOTE — Progress Notes (Signed)
Patient ID: [name], male   DOB: January 06, 1941, 74 y.o.   MRN: [id number]   Location:  [facility]  Code Status: full code  Goals of Care:Advanced Directive information Does patient have an advance directive?: Yes, Type of Advance Directive: Jensen;Living will, Does patient want to make changes to advanced directive?: No - Patient declined  Chief Complaint  Patient presents with  . Medical Management of Chronic Issues    multiple sclerosis, memory, gait    HPI: Patient is a 74 y.o. male seen in the [facility] today for med mgt of chronic diseases.  He has MS, memory loss and gait disturbance.    Has difficulty going to the bathroom which has been on ongoing problem.  Improves with drinking a lot of water.  Lots of sign reminders and takes medication for this.    Bowels have been moving well.    Has not been going to dining room three times a day for meals.  Is an effort to get down there, then has urge to use restroom but does not necessarily go.  Mix of MS and BPH.  Has had a lot of testing at [facility].  Dr. Tish Frederickson is neurologist and Dr. Amalia Hailey is urologist.  Does sometimes have some incontinence when tries to make it to restroom.  Could use depend.  He says he does not wear them.  Tries to go urinate before leaving room.   He has no other concerns. He has slight swelling of his right hand and decreased mobility of his right side from his MS.  He uses a wheelchair to get around.  He lives in assisted living.    Review of Systems:  Review of Systems  Constitutional: Negative for fever and chills.  HENT: Negative for congestion and hearing loss.   Eyes: Negative for blurred vision.  Respiratory: Negative for shortness of breath.   Cardiovascular: Negative for chest pain and leg swelling.  Gastrointestinal: Negative for abdominal pain, constipation, blood in stool and melena.  Genitourinary: Positive for urgency and frequency. Negative for  dysuria and hematuria.       Urinary retention  Musculoskeletal: Negative for myalgias, joint pain and falls.  Skin: Negative for rash.  Neurological: Positive for focal weakness. Negative for dizziness, tingling, sensory change and speech change.  Endo/Heme/Allergies: Does not bruise/bleed easily.  Psychiatric/Behavioral: Positive for memory loss. Negative for depression. The patient is not nervous/anxious and does not have insomnia.     Past Medical History  Diagnosis Date  . Multiple sclerosis 2008  . BPH (benign prostatic hyperplasia)   . Prostatitis, chronic   . Allergy   . GERD (gastroesophageal reflux disease)   . COPD (chronic obstructive pulmonary disease)   . Adenomatous polyp   . Hyperlipidemia 07/31/2014  . Generalized weakness 01/12/2013  . 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 Elevated PSA 07/31/2014  . Foot drop, right 2008  . Dementia 2010    Past Surgical History  Procedure Laterality Date  . Hernia repair  [id number]    3 inguinal hernia repairs on left  . Hernia repair  06/16/11    right inguinal hernia repair with mesh   . Prostate surgery  09/29/2008    Dr. Karsten Ro   . Esophagogastroduodenoscopy endoscopy  04/16/2002    Dr. Earle Gell  . Cataract extraction Left 02/06/12  . Cataract extraction Right 2013    Social History:   reports that he quit smoking about 39 years ago. He  has never used smokeless tobacco. He reports that he does not drink alcohol or use illicit drugs.  Allergies  Allergen Reactions  . Aricept [Donepezil Hcl]     Medications: Patient's Medications  New Prescriptions   No medications on file  Previous Medications   ASCORBIC ACID (VITAMIN C PO)    Take by mouth daily.     ASPIRIN 81 MG TABLET    Take 81 mg by mouth daily. Take one tablet daily   BIOTIN 5 MG TABS    Take by mouth. Take one tablet daily   CHOLECALCIFEROL (VITAMIN D PO)    Take by mouth daily. Take 2,000 daily   DOCUSATE SODIUM (COLACE) 100 MG CAPSULE    Take 100 mg by  mouth daily.   MAGNESIUM HYDROXIDE (MILK OF MAGNESIA) 400 MG/5ML SUSPENSION    Take 15 mLs by mouth daily as needed for mild constipation.   MEMANTINE (NAMENDA) 10 MG TABLET    Take one twice daily for memory   NAPROXEN SODIUM (ANAPROX) 220 MG TABLET    Take 220 mg by mouth. Take one every 8 hours as needed for discomfort   OMEGA-3 FATTY ACIDS (FISH OIL PO)    Take 100 mg by mouth daily.   SAW PALMETTO 160 MG CAPSULE    Take 160 mg by mouth. Take one tablet daily   SERTRALINE (ZOLOFT) 50 MG TABLET    Take 50 mg by mouth daily. Take 1 1/2 tablets =75mg  daily   SILODOSIN (RAPAFLO) 8 MG CAPS CAPSULE    Take 8 mg by mouth daily with breakfast.   TRAMADOL (ULTRAM) 50 MG TABLET    Take one table twice daily as needed for pain   ZOLPIDEM (AMBIEN) 5 MG TABLET    Take 5 mg by mouth at bedtime as needed for sleep.  Modified Medications   No medications on file  Discontinued Medications   MEMANTINE HCL ER (NAMENDA XR) 28 MG CP24    Take 1 capsule by mouth daily.     Physical Exam: Filed Vitals:   02/10/15 1342  BP: 104/60  Pulse: 60  Temp: 97.7 F (36.5 C)  TempSrc: Oral  Weight: 178 lb (80.74 kg)  SpO2: 96%   Body mass index is 24.14 kg/(m^2). Physical Exam  Constitutional: He is oriented to person, place, and time. He appears well-developed and well-nourished. No distress.  Cardiovascular: Normal rate, regular rhythm, normal heart sounds and intact distal pulses.   Pulmonary/Chest: Effort normal and breath sounds normal. No respiratory distress.  Abdominal: Soft. Bowel sounds are normal. He exhibits no distension and no mass. There is no tenderness.  Musculoskeletal: He exhibits no tenderness.  Right-sided weakness; very slight right hand swelling  Neurological: He is alert and oriented to person, place, and time.  Skin: Skin is warm and dry.  Psychiatric: He has a normal mood and affect.     Labs reviewed: Basic Metabolic Panel:  Recent Labs  05/13/14 0924 09/09/14  NA 136 140   K 4.0 3.6  CL 102  --   CO2 24  --   GLUCOSE 87  --   BUN 18 14  CREATININE 0.88 0.8  CALCIUM 9.6  --    Liver Function Tests:  Recent Labs  05/13/14 0924  AST 17  ALT 14  ALKPHOS 58  BILITOT 0.6  PROT 6.7  ALBUMIN 4.4   No results for input(s): LIPASE, AMYLASE in the last 8760 hours. No results for input(s): AMMONIA in the last 8760  hours. CBC:  Recent Labs  05/13/14 0924  WBC 7.7  NEUTROABS 5.2  HGB 16.0  HCT 46.5  MCV 86.6  PLT 174   Lipid Panel:  Recent Labs  05/13/14 0924  CHOL 213*  HDL 54  LDLCALC 135*  TRIG 120  CHOLHDL 3.9   Lab Results  Component Value Date   HGBA1C 5.7* 01/13/2013    Procedures since last appt: none, but note from Dr. Leta Baptist reviewed  Patient Care Team: [name], DO as PCP - General (Geriatric Medicine) [name], MD as Consulting Physician (Neurology) [name], MD as Consulting Physician (Urology) [name], MD as Consulting Physician (General Surgery) [name], MD as Consulting Physician (Orthopedic Surgery) [name], MD as Consulting Physician (General Surgery) [name], MD as Consulting Physician (Gastroenterology) [name], DO as Consulting Physician (Geriatric Medicine)  Assessment/Plan 1. Neurogenic bladder -this is his most bothersome problem -continues his rapaflo and drinking plenty of water, admits to occasional urinary incontinence when his wife mentions seeing some PJs and underwear hanging over the rail in the restroom -he follows with Dr. Amalia Hailey for this and also has BPH  2. Multiple sclerosis -he follows with Dr. Tish Frederickson for this and has right-sided weakness from it and memory loss with mild functional loss--lives in [place] and uses wheelchair for transportation -he is a retired Chief Executive Officer (civil cases)  3. Memory loss due to medical condition -continues on namenda 10mg  po bid (changed from XR I suppose for cost purposes)  4. Gait disorder -uses  wheelchair for long distances, has weak right side with mild RUE edema present  5. BPH (benign prostatic hyperplasia) -cont rapaflo, d/c saw palmetto due to probable lack of benefit at this time  6. Hyperlipidemia -d/c fish oil, not on statin therapy, no known CAD so lipids are satisfactory considering his comorbid illnesses  7. Need for vaccination with 13-polyvalent pneumococcal conjugate vaccine -prevnar given today  We decided to d/c vitamin C, fish oil and saw palmetto.  No clear reason for c, HDL is good and eats well, using rapaflo for the prostate so saw palmetto is duplicate tx.  Labs/tests ordered:  Will order at his physical Next appt:  Annual exam is in November  Avina Eberle L. Klint Lezcano, D.O. [facility] [address], [place] [address] Cell Phone (Mon-Fri 8am-5pm):  [phone] On Call:  [phone] & follow prompts after 5pm & weekends Office Phone:  [phone] Office Fax:  [phone]

## 2015-03-06 DIAGNOSIS — G3281 Cerebellar ataxia in diseases classified elsewhere: Secondary | ICD-10-CM | POA: Diagnosis not present

## 2015-03-06 DIAGNOSIS — R278 Other lack of coordination: Secondary | ICD-10-CM | POA: Diagnosis not present

## 2015-03-06 DIAGNOSIS — R413 Other amnesia: Secondary | ICD-10-CM | POA: Diagnosis not present

## 2015-03-06 DIAGNOSIS — M6281 Muscle weakness (generalized): Secondary | ICD-10-CM | POA: Diagnosis not present

## 2015-03-06 DIAGNOSIS — G8191 Hemiplegia, unspecified affecting right dominant side: Secondary | ICD-10-CM | POA: Diagnosis not present

## 2015-03-09 DIAGNOSIS — M6281 Muscle weakness (generalized): Secondary | ICD-10-CM | POA: Diagnosis not present

## 2015-03-09 DIAGNOSIS — G8191 Hemiplegia, unspecified affecting right dominant side: Secondary | ICD-10-CM | POA: Diagnosis not present

## 2015-03-09 DIAGNOSIS — R413 Other amnesia: Secondary | ICD-10-CM | POA: Diagnosis not present

## 2015-03-09 DIAGNOSIS — G3281 Cerebellar ataxia in diseases classified elsewhere: Secondary | ICD-10-CM | POA: Diagnosis not present

## 2015-03-09 DIAGNOSIS — R278 Other lack of coordination: Secondary | ICD-10-CM | POA: Diagnosis not present

## 2015-03-10 DIAGNOSIS — R413 Other amnesia: Secondary | ICD-10-CM | POA: Diagnosis not present

## 2015-03-10 DIAGNOSIS — G3281 Cerebellar ataxia in diseases classified elsewhere: Secondary | ICD-10-CM | POA: Diagnosis not present

## 2015-03-10 DIAGNOSIS — M6281 Muscle weakness (generalized): Secondary | ICD-10-CM | POA: Diagnosis not present

## 2015-03-10 DIAGNOSIS — G8191 Hemiplegia, unspecified affecting right dominant side: Secondary | ICD-10-CM | POA: Diagnosis not present

## 2015-03-10 DIAGNOSIS — R278 Other lack of coordination: Secondary | ICD-10-CM | POA: Diagnosis not present

## 2015-03-12 DIAGNOSIS — G3281 Cerebellar ataxia in diseases classified elsewhere: Secondary | ICD-10-CM | POA: Diagnosis not present

## 2015-03-12 DIAGNOSIS — G8191 Hemiplegia, unspecified affecting right dominant side: Secondary | ICD-10-CM | POA: Diagnosis not present

## 2015-03-12 DIAGNOSIS — R278 Other lack of coordination: Secondary | ICD-10-CM | POA: Diagnosis not present

## 2015-03-12 DIAGNOSIS — R413 Other amnesia: Secondary | ICD-10-CM | POA: Diagnosis not present

## 2015-03-12 DIAGNOSIS — M6281 Muscle weakness (generalized): Secondary | ICD-10-CM | POA: Diagnosis not present

## 2015-03-16 DIAGNOSIS — R413 Other amnesia: Secondary | ICD-10-CM | POA: Diagnosis not present

## 2015-03-16 DIAGNOSIS — R278 Other lack of coordination: Secondary | ICD-10-CM | POA: Diagnosis not present

## 2015-03-16 DIAGNOSIS — G8191 Hemiplegia, unspecified affecting right dominant side: Secondary | ICD-10-CM | POA: Diagnosis not present

## 2015-03-16 DIAGNOSIS — G3281 Cerebellar ataxia in diseases classified elsewhere: Secondary | ICD-10-CM | POA: Diagnosis not present

## 2015-03-16 DIAGNOSIS — M6281 Muscle weakness (generalized): Secondary | ICD-10-CM | POA: Diagnosis not present

## 2015-03-18 DIAGNOSIS — R278 Other lack of coordination: Secondary | ICD-10-CM | POA: Diagnosis not present

## 2015-03-18 DIAGNOSIS — G8191 Hemiplegia, unspecified affecting right dominant side: Secondary | ICD-10-CM | POA: Diagnosis not present

## 2015-03-18 DIAGNOSIS — R413 Other amnesia: Secondary | ICD-10-CM | POA: Diagnosis not present

## 2015-03-18 DIAGNOSIS — G3281 Cerebellar ataxia in diseases classified elsewhere: Secondary | ICD-10-CM | POA: Diagnosis not present

## 2015-03-18 DIAGNOSIS — M6281 Muscle weakness (generalized): Secondary | ICD-10-CM | POA: Diagnosis not present

## 2015-03-19 DIAGNOSIS — R278 Other lack of coordination: Secondary | ICD-10-CM | POA: Diagnosis not present

## 2015-03-19 DIAGNOSIS — R413 Other amnesia: Secondary | ICD-10-CM | POA: Diagnosis not present

## 2015-03-19 DIAGNOSIS — G8191 Hemiplegia, unspecified affecting right dominant side: Secondary | ICD-10-CM | POA: Diagnosis not present

## 2015-03-19 DIAGNOSIS — M6281 Muscle weakness (generalized): Secondary | ICD-10-CM | POA: Diagnosis not present

## 2015-03-19 DIAGNOSIS — G3281 Cerebellar ataxia in diseases classified elsewhere: Secondary | ICD-10-CM | POA: Diagnosis not present

## 2015-03-24 DIAGNOSIS — G3281 Cerebellar ataxia in diseases classified elsewhere: Secondary | ICD-10-CM | POA: Diagnosis not present

## 2015-03-24 DIAGNOSIS — R413 Other amnesia: Secondary | ICD-10-CM | POA: Diagnosis not present

## 2015-03-24 DIAGNOSIS — G8191 Hemiplegia, unspecified affecting right dominant side: Secondary | ICD-10-CM | POA: Diagnosis not present

## 2015-03-24 DIAGNOSIS — R278 Other lack of coordination: Secondary | ICD-10-CM | POA: Diagnosis not present

## 2015-03-24 DIAGNOSIS — M6281 Muscle weakness (generalized): Secondary | ICD-10-CM | POA: Diagnosis not present

## 2015-03-25 DIAGNOSIS — R278 Other lack of coordination: Secondary | ICD-10-CM | POA: Diagnosis not present

## 2015-03-25 DIAGNOSIS — G8191 Hemiplegia, unspecified affecting right dominant side: Secondary | ICD-10-CM | POA: Diagnosis not present

## 2015-03-25 DIAGNOSIS — G3281 Cerebellar ataxia in diseases classified elsewhere: Secondary | ICD-10-CM | POA: Diagnosis not present

## 2015-03-25 DIAGNOSIS — R413 Other amnesia: Secondary | ICD-10-CM | POA: Diagnosis not present

## 2015-03-25 DIAGNOSIS — M6281 Muscle weakness (generalized): Secondary | ICD-10-CM | POA: Diagnosis not present

## 2015-03-26 DIAGNOSIS — R278 Other lack of coordination: Secondary | ICD-10-CM | POA: Diagnosis not present

## 2015-03-26 DIAGNOSIS — R413 Other amnesia: Secondary | ICD-10-CM | POA: Diagnosis not present

## 2015-03-26 DIAGNOSIS — M6281 Muscle weakness (generalized): Secondary | ICD-10-CM | POA: Diagnosis not present

## 2015-03-26 DIAGNOSIS — G3281 Cerebellar ataxia in diseases classified elsewhere: Secondary | ICD-10-CM | POA: Diagnosis not present

## 2015-03-26 DIAGNOSIS — G8191 Hemiplegia, unspecified affecting right dominant side: Secondary | ICD-10-CM | POA: Diagnosis not present

## 2015-03-30 DIAGNOSIS — M6281 Muscle weakness (generalized): Secondary | ICD-10-CM | POA: Diagnosis not present

## 2015-03-30 DIAGNOSIS — G3281 Cerebellar ataxia in diseases classified elsewhere: Secondary | ICD-10-CM | POA: Diagnosis not present

## 2015-03-30 DIAGNOSIS — R413 Other amnesia: Secondary | ICD-10-CM | POA: Diagnosis not present

## 2015-03-30 DIAGNOSIS — G8191 Hemiplegia, unspecified affecting right dominant side: Secondary | ICD-10-CM | POA: Diagnosis not present

## 2015-03-30 DIAGNOSIS — R278 Other lack of coordination: Secondary | ICD-10-CM | POA: Diagnosis not present

## 2015-04-01 DIAGNOSIS — G3281 Cerebellar ataxia in diseases classified elsewhere: Secondary | ICD-10-CM | POA: Diagnosis not present

## 2015-04-01 DIAGNOSIS — G8191 Hemiplegia, unspecified affecting right dominant side: Secondary | ICD-10-CM | POA: Diagnosis not present

## 2015-04-01 DIAGNOSIS — R413 Other amnesia: Secondary | ICD-10-CM | POA: Diagnosis not present

## 2015-04-01 DIAGNOSIS — R278 Other lack of coordination: Secondary | ICD-10-CM | POA: Diagnosis not present

## 2015-04-01 DIAGNOSIS — M6281 Muscle weakness (generalized): Secondary | ICD-10-CM | POA: Diagnosis not present

## 2015-04-02 DIAGNOSIS — G3281 Cerebellar ataxia in diseases classified elsewhere: Secondary | ICD-10-CM | POA: Diagnosis not present

## 2015-04-02 DIAGNOSIS — G8191 Hemiplegia, unspecified affecting right dominant side: Secondary | ICD-10-CM | POA: Diagnosis not present

## 2015-04-02 DIAGNOSIS — R278 Other lack of coordination: Secondary | ICD-10-CM | POA: Diagnosis not present

## 2015-04-02 DIAGNOSIS — R413 Other amnesia: Secondary | ICD-10-CM | POA: Diagnosis not present

## 2015-04-02 DIAGNOSIS — M6281 Muscle weakness (generalized): Secondary | ICD-10-CM | POA: Diagnosis not present

## 2015-04-06 DIAGNOSIS — G3281 Cerebellar ataxia in diseases classified elsewhere: Secondary | ICD-10-CM | POA: Diagnosis not present

## 2015-04-06 DIAGNOSIS — M6281 Muscle weakness (generalized): Secondary | ICD-10-CM | POA: Diagnosis not present

## 2015-04-06 DIAGNOSIS — G8191 Hemiplegia, unspecified affecting right dominant side: Secondary | ICD-10-CM | POA: Diagnosis not present

## 2015-04-06 DIAGNOSIS — R278 Other lack of coordination: Secondary | ICD-10-CM | POA: Diagnosis not present

## 2015-04-06 DIAGNOSIS — R413 Other amnesia: Secondary | ICD-10-CM | POA: Diagnosis not present

## 2015-04-08 DIAGNOSIS — R278 Other lack of coordination: Secondary | ICD-10-CM | POA: Diagnosis not present

## 2015-04-08 DIAGNOSIS — M6281 Muscle weakness (generalized): Secondary | ICD-10-CM | POA: Diagnosis not present

## 2015-04-08 DIAGNOSIS — G8191 Hemiplegia, unspecified affecting right dominant side: Secondary | ICD-10-CM | POA: Diagnosis not present

## 2015-04-08 DIAGNOSIS — R413 Other amnesia: Secondary | ICD-10-CM | POA: Diagnosis not present

## 2015-04-08 DIAGNOSIS — G3281 Cerebellar ataxia in diseases classified elsewhere: Secondary | ICD-10-CM | POA: Diagnosis not present

## 2015-04-09 DIAGNOSIS — G8191 Hemiplegia, unspecified affecting right dominant side: Secondary | ICD-10-CM | POA: Diagnosis not present

## 2015-04-09 DIAGNOSIS — M6281 Muscle weakness (generalized): Secondary | ICD-10-CM | POA: Diagnosis not present

## 2015-04-09 DIAGNOSIS — G3281 Cerebellar ataxia in diseases classified elsewhere: Secondary | ICD-10-CM | POA: Diagnosis not present

## 2015-04-09 DIAGNOSIS — R413 Other amnesia: Secondary | ICD-10-CM | POA: Diagnosis not present

## 2015-04-09 DIAGNOSIS — R278 Other lack of coordination: Secondary | ICD-10-CM | POA: Diagnosis not present

## 2015-04-13 DIAGNOSIS — G8191 Hemiplegia, unspecified affecting right dominant side: Secondary | ICD-10-CM | POA: Diagnosis not present

## 2015-04-13 DIAGNOSIS — R278 Other lack of coordination: Secondary | ICD-10-CM | POA: Diagnosis not present

## 2015-04-13 DIAGNOSIS — M6281 Muscle weakness (generalized): Secondary | ICD-10-CM | POA: Diagnosis not present

## 2015-04-13 DIAGNOSIS — R413 Other amnesia: Secondary | ICD-10-CM | POA: Diagnosis not present

## 2015-04-13 DIAGNOSIS — G3281 Cerebellar ataxia in diseases classified elsewhere: Secondary | ICD-10-CM | POA: Diagnosis not present

## 2015-04-15 DIAGNOSIS — G3281 Cerebellar ataxia in diseases classified elsewhere: Secondary | ICD-10-CM | POA: Diagnosis not present

## 2015-04-15 DIAGNOSIS — R278 Other lack of coordination: Secondary | ICD-10-CM | POA: Diagnosis not present

## 2015-04-15 DIAGNOSIS — M6281 Muscle weakness (generalized): Secondary | ICD-10-CM | POA: Diagnosis not present

## 2015-04-15 DIAGNOSIS — G8191 Hemiplegia, unspecified affecting right dominant side: Secondary | ICD-10-CM | POA: Diagnosis not present

## 2015-04-15 DIAGNOSIS — R413 Other amnesia: Secondary | ICD-10-CM | POA: Diagnosis not present

## 2015-04-17 DIAGNOSIS — R413 Other amnesia: Secondary | ICD-10-CM | POA: Diagnosis not present

## 2015-04-17 DIAGNOSIS — G8191 Hemiplegia, unspecified affecting right dominant side: Secondary | ICD-10-CM | POA: Diagnosis not present

## 2015-04-17 DIAGNOSIS — R278 Other lack of coordination: Secondary | ICD-10-CM | POA: Diagnosis not present

## 2015-04-17 DIAGNOSIS — G3281 Cerebellar ataxia in diseases classified elsewhere: Secondary | ICD-10-CM | POA: Diagnosis not present

## 2015-04-17 DIAGNOSIS — M6281 Muscle weakness (generalized): Secondary | ICD-10-CM | POA: Diagnosis not present

## 2015-04-20 DIAGNOSIS — G8191 Hemiplegia, unspecified affecting right dominant side: Secondary | ICD-10-CM | POA: Diagnosis not present

## 2015-04-20 DIAGNOSIS — R413 Other amnesia: Secondary | ICD-10-CM | POA: Diagnosis not present

## 2015-04-20 DIAGNOSIS — G3281 Cerebellar ataxia in diseases classified elsewhere: Secondary | ICD-10-CM | POA: Diagnosis not present

## 2015-04-20 DIAGNOSIS — M6281 Muscle weakness (generalized): Secondary | ICD-10-CM | POA: Diagnosis not present

## 2015-04-20 DIAGNOSIS — R278 Other lack of coordination: Secondary | ICD-10-CM | POA: Diagnosis not present

## 2015-04-21 DIAGNOSIS — M6281 Muscle weakness (generalized): Secondary | ICD-10-CM | POA: Diagnosis not present

## 2015-04-21 DIAGNOSIS — G8191 Hemiplegia, unspecified affecting right dominant side: Secondary | ICD-10-CM | POA: Diagnosis not present

## 2015-04-21 DIAGNOSIS — R278 Other lack of coordination: Secondary | ICD-10-CM | POA: Diagnosis not present

## 2015-04-21 DIAGNOSIS — R413 Other amnesia: Secondary | ICD-10-CM | POA: Diagnosis not present

## 2015-04-21 DIAGNOSIS — G3281 Cerebellar ataxia in diseases classified elsewhere: Secondary | ICD-10-CM | POA: Diagnosis not present

## 2015-04-24 DIAGNOSIS — R278 Other lack of coordination: Secondary | ICD-10-CM | POA: Diagnosis not present

## 2015-04-24 DIAGNOSIS — G8191 Hemiplegia, unspecified affecting right dominant side: Secondary | ICD-10-CM | POA: Diagnosis not present

## 2015-04-24 DIAGNOSIS — R413 Other amnesia: Secondary | ICD-10-CM | POA: Diagnosis not present

## 2015-04-24 DIAGNOSIS — G3281 Cerebellar ataxia in diseases classified elsewhere: Secondary | ICD-10-CM | POA: Diagnosis not present

## 2015-04-24 DIAGNOSIS — M6281 Muscle weakness (generalized): Secondary | ICD-10-CM | POA: Diagnosis not present

## 2015-04-27 DIAGNOSIS — G3281 Cerebellar ataxia in diseases classified elsewhere: Secondary | ICD-10-CM | POA: Diagnosis not present

## 2015-04-27 DIAGNOSIS — R278 Other lack of coordination: Secondary | ICD-10-CM | POA: Diagnosis not present

## 2015-04-27 DIAGNOSIS — R413 Other amnesia: Secondary | ICD-10-CM | POA: Diagnosis not present

## 2015-04-27 DIAGNOSIS — G8191 Hemiplegia, unspecified affecting right dominant side: Secondary | ICD-10-CM | POA: Diagnosis not present

## 2015-04-27 DIAGNOSIS — M6281 Muscle weakness (generalized): Secondary | ICD-10-CM | POA: Diagnosis not present

## 2015-04-30 DIAGNOSIS — G8191 Hemiplegia, unspecified affecting right dominant side: Secondary | ICD-10-CM | POA: Diagnosis not present

## 2015-04-30 DIAGNOSIS — G3281 Cerebellar ataxia in diseases classified elsewhere: Secondary | ICD-10-CM | POA: Diagnosis not present

## 2015-04-30 DIAGNOSIS — M6281 Muscle weakness (generalized): Secondary | ICD-10-CM | POA: Diagnosis not present

## 2015-04-30 DIAGNOSIS — R413 Other amnesia: Secondary | ICD-10-CM | POA: Diagnosis not present

## 2015-04-30 DIAGNOSIS — R278 Other lack of coordination: Secondary | ICD-10-CM | POA: Diagnosis not present

## 2015-05-04 DIAGNOSIS — G8191 Hemiplegia, unspecified affecting right dominant side: Secondary | ICD-10-CM | POA: Diagnosis not present

## 2015-05-04 DIAGNOSIS — R278 Other lack of coordination: Secondary | ICD-10-CM | POA: Diagnosis not present

## 2015-05-04 DIAGNOSIS — R413 Other amnesia: Secondary | ICD-10-CM | POA: Diagnosis not present

## 2015-05-04 DIAGNOSIS — M6281 Muscle weakness (generalized): Secondary | ICD-10-CM | POA: Diagnosis not present

## 2015-05-04 DIAGNOSIS — G3281 Cerebellar ataxia in diseases classified elsewhere: Secondary | ICD-10-CM | POA: Diagnosis not present

## 2015-05-06 DIAGNOSIS — G3281 Cerebellar ataxia in diseases classified elsewhere: Secondary | ICD-10-CM | POA: Diagnosis not present

## 2015-05-06 DIAGNOSIS — R278 Other lack of coordination: Secondary | ICD-10-CM | POA: Diagnosis not present

## 2015-05-06 DIAGNOSIS — R413 Other amnesia: Secondary | ICD-10-CM | POA: Diagnosis not present

## 2015-05-06 DIAGNOSIS — M6281 Muscle weakness (generalized): Secondary | ICD-10-CM | POA: Diagnosis not present

## 2015-05-06 DIAGNOSIS — G8191 Hemiplegia, unspecified affecting right dominant side: Secondary | ICD-10-CM | POA: Diagnosis not present

## 2015-05-13 DIAGNOSIS — S0093XA Contusion of unspecified part of head, initial encounter: Secondary | ICD-10-CM | POA: Diagnosis not present

## 2015-05-13 DIAGNOSIS — S0990XA Unspecified injury of head, initial encounter: Secondary | ICD-10-CM | POA: Diagnosis not present

## 2015-05-14 ENCOUNTER — Emergency Department (HOSPITAL_COMMUNITY): Payer: Medicare Other

## 2015-05-14 ENCOUNTER — Emergency Department (HOSPITAL_COMMUNITY)
Admission: EM | Admit: 2015-05-14 | Discharge: 2015-05-14 | Disposition: A | Discharge status: Home / Self Care | Payer: Medicare Other | Attending: Emergency Medicine | Admitting: Emergency Medicine

## 2015-05-14 ENCOUNTER — Encounter (HOSPITAL_COMMUNITY): Payer: Self-pay | Admitting: Nurse Practitioner

## 2015-05-14 DIAGNOSIS — Z87891 Personal history of nicotine dependence: Secondary | ICD-10-CM | POA: Insufficient documentation

## 2015-05-14 DIAGNOSIS — S199XXA Unspecified injury of neck, initial encounter: Secondary | ICD-10-CM | POA: Diagnosis not present

## 2015-05-14 DIAGNOSIS — S0101XA Laceration without foreign body of scalp, initial encounter: Secondary | ICD-10-CM | POA: Insufficient documentation

## 2015-05-14 DIAGNOSIS — Y9389 Activity, other specified: Secondary | ICD-10-CM | POA: Insufficient documentation

## 2015-05-14 DIAGNOSIS — W19XXXA Unspecified fall, initial encounter: Secondary | ICD-10-CM

## 2015-05-14 DIAGNOSIS — Y998 Other external cause status: Secondary | ICD-10-CM | POA: Insufficient documentation

## 2015-05-14 DIAGNOSIS — S0990XA Unspecified injury of head, initial encounter: Secondary | ICD-10-CM | POA: Diagnosis present

## 2015-05-14 DIAGNOSIS — Z79899 Other long term (current) drug therapy: Secondary | ICD-10-CM | POA: Diagnosis not present

## 2015-05-14 DIAGNOSIS — F0390 Unspecified dementia without behavioral disturbance: Secondary | ICD-10-CM | POA: Diagnosis not present

## 2015-05-14 DIAGNOSIS — Z7982 Long term (current) use of aspirin: Secondary | ICD-10-CM | POA: Diagnosis not present

## 2015-05-14 DIAGNOSIS — G3281 Cerebellar ataxia in diseases classified elsewhere: Secondary | ICD-10-CM | POA: Diagnosis not present

## 2015-05-14 DIAGNOSIS — Z86018 Personal history of other benign neoplasm: Secondary | ICD-10-CM | POA: Insufficient documentation

## 2015-05-14 DIAGNOSIS — R52 Pain, unspecified: Secondary | ICD-10-CM | POA: Diagnosis not present

## 2015-05-14 DIAGNOSIS — N400 Enlarged prostate without lower urinary tract symptoms: Secondary | ICD-10-CM | POA: Insufficient documentation

## 2015-05-14 DIAGNOSIS — Z8639 Personal history of other endocrine, nutritional and metabolic disease: Secondary | ICD-10-CM | POA: Insufficient documentation

## 2015-05-14 DIAGNOSIS — M6281 Muscle weakness (generalized): Secondary | ICD-10-CM | POA: Diagnosis not present

## 2015-05-14 DIAGNOSIS — Z8669 Personal history of other diseases of the nervous system and sense organs: Secondary | ICD-10-CM | POA: Insufficient documentation

## 2015-05-14 DIAGNOSIS — K219 Gastro-esophageal reflux disease without esophagitis: Secondary | ICD-10-CM | POA: Diagnosis not present

## 2015-05-14 DIAGNOSIS — J449 Chronic obstructive pulmonary disease, unspecified: Secondary | ICD-10-CM | POA: Diagnosis not present

## 2015-05-14 DIAGNOSIS — W1839XA Other fall on same level, initial encounter: Secondary | ICD-10-CM | POA: Diagnosis not present

## 2015-05-14 DIAGNOSIS — Y9289 Other specified places as the place of occurrence of the external cause: Secondary | ICD-10-CM | POA: Diagnosis not present

## 2015-05-14 DIAGNOSIS — R410 Disorientation, unspecified: Secondary | ICD-10-CM | POA: Diagnosis not present

## 2015-05-14 DIAGNOSIS — R413 Other amnesia: Secondary | ICD-10-CM | POA: Diagnosis not present

## 2015-05-14 DIAGNOSIS — G8191 Hemiplegia, unspecified affecting right dominant side: Secondary | ICD-10-CM | POA: Diagnosis not present

## 2015-05-14 DIAGNOSIS — R278 Other lack of coordination: Secondary | ICD-10-CM | POA: Diagnosis not present

## 2015-05-14 NOTE — Discharge Instructions (Signed)
Fall Prevention in Hospitals As a hospital patient, your condition and the treatments you receive can increase your risk for falls. Some additional risk factors for falls in a hospital include:  Being in an unfamiliar environment.  Being on bed rest.  Your surgery.  Taking certain medicines.  Your tubing requirements, such as intravenous (IV) therapy or catheters. It is important that you learn how to decrease fall risks while at the hospital. Below are important tips that can help prevent falls. SAFETY TIPS FOR PREVENTING FALLS Talk about your risk of falling.  Ask your caregiver why you are at risk for falling. Is it your medicine, illness, tubing placement, or something else?  Make a plan with your caregiver to keep you safe from falls.  Ask your caregiver or pharmacist about side effect of your medicines. Some medicines can make you dizzy or affect your coordination. Ask for help.  Ask for help before getting out of bed. You may need to press your call button.  Ask for assistance in getting you safely to the toilet.  Ask for a walker or cane to be put at your bedside. Ask that most of the side rails on your bed be placed up before your caregiver leaves the room.  Ask family or friends to sit with you.  Ask for things that are out of your reach, such as your glasses, hearing aids, telephone, bedside table, or call button. Follow these tips to avoid falling:  Stay lying or seated, rather than standing, while waiting for help.  Wear rubber-soled slippers or shoes whenever you walk in the hospital.  Avoid quick, sudden movements.  Change positions slowly.  Sit on the side of your bed before standing.  Stand up slowly and wait before you start to walk.  Let your caregiver know if there is a spill on the floor.  Pay careful attention to the medical equipment, electrical cords, and tubes around you.  When you need help, use your call button by your bed or in the  bathroom. Wait for one of your caregivers to help you.  If you feel dizzy or unsure of your footing, return to bed and wait for assistance.  Avoid being distracted by the TV, telephone, or another person in your room.  Do not lean or support yourself on rolling objects, such as IV poles or bedside tables. Document Released: 09/02/2000 Document Revised: 08/22/2012 Document Reviewed: 05/13/2012 [facility] Patient Information 2015 [place], [place]. This information is not intended to replace advice given to you by your health care provider. Make sure you discuss any questions you have with your health care provider. [name] head and neck CT Scan are normal He does have a 1.5 CM laceration to the top of his head that required 2 staples theywill need to be removed in 10 days

## 2015-05-14 NOTE — ED Notes (Signed)
Bed: [facility] Expected date:  Expected time:  Means of arrival:  Comments: EMS 74 yo male unwitnessed fall-from Assisted Living

## 2015-05-14 NOTE — ED Notes (Signed)
Pt is presented from [facility], report of an unwitnessed fall, hematoma in the occipital aspect of his head, c-collar stabilized on arrival, c/o head pain.

## 2015-05-14 NOTE — ED Notes (Signed)
[facility] here for transportation back to facility 

## 2015-05-14 NOTE — ED Provider Notes (Signed)
Patient fell at his memory unit and has a laceration on the scalp. He has what appears to be a 4 cm laceration of these posterior superior scalp with mild bleeding. Patient is awake and alert. He however asks the same questions repetitively.  Medical screening examination/treatment/procedure(s) were conducted as a shared visit with non-physician practitioner(s) and myself.  I personally evaluated the patient during the encounter.   EKG Interpretation None       Rolland Porter, MD, Barbette Or, MD 05/14/15 [phone]

## 2015-05-14 NOTE — ED Notes (Signed)
[facility] called for transport back to [name] and report given to [employer] at [facility]

## 2015-05-14 NOTE — ED Provider Notes (Signed)
CSN: [id number]     Arrival date & time 05/14/15  0007 History   First MD Initiated Contact with Patient 05/14/15 0114     Chief Complaint  Patient presents with  . Fall  . Head Injury     (Consider location/radiation/quality/duration/timing/severity/associated sxs/prior Treatment) HPI Comments: Patient lives in a memory care unit, was found with a hematoma and blood on the top of his head.  Patient has no recollection of events is denies any pain at this time.  Patient is a 74 y.o. male presenting with fall and head injury. The history is provided by the patient and the nursing home.  Fall This is a recurrent problem. The current episode started today. The problem has been unchanged. Pertinent negatives include no chest pain, fever, headaches, neck pain or vomiting. Nothing aggravates the symptoms. He has tried nothing for the symptoms. The treatment provided no relief.  Head Injury Associated symptoms: no headaches, no neck pain and no vomiting     Past Medical History  Diagnosis Date  . Multiple sclerosis 2008  . BPH (benign prostatic hyperplasia)   . Prostatitis, chronic   . Allergy   . GERD (gastroesophageal reflux disease)   . COPD (chronic obstructive pulmonary disease)   . Adenomatous polyp   . Hyperlipidemia 07/31/2014  . Generalized weakness 01/12/2013  . 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 Elevated PSA 07/31/2014  . Foot drop, right 2008  . Dementia 2010   Past Surgical History  Procedure Laterality Date  . Hernia repair  [id number]    3 inguinal hernia repairs on left  . Hernia repair  06/16/11    right inguinal hernia repair with mesh   . Prostate surgery  09/29/2008    Dr. Karsten Ro   . Esophagogastroduodenoscopy endoscopy  04/16/2002    Dr. Earle Gell  . Cataract extraction Left 02/06/12  . Cataract extraction Right 2013   Family History  Problem Relation Age of Onset  . Depression Mother   . Dementia Father   . Pneumonia Father    Social History  Substance  Use Topics  . Smoking status: Former Smoker    Quit date: 09/20/1975  . Smokeless tobacco: Never Used  . Alcohol Use: No     Comment: Quit: 2003    Review of Systems  Constitutional: Negative for fever.  Respiratory: Negative for shortness of breath.   Cardiovascular: Negative for chest pain.  Gastrointestinal: Negative for vomiting.  Musculoskeletal: Negative for neck pain.  Skin: Positive for wound.  Neurological: Negative for dizziness and headaches.  All other systems reviewed and are negative.     Allergies  Aricept  Home Medications   Prior to Admission medications   Medication Sig Start Date End Date Taking? Authorizing Provider  aspirin 81 MG tablet Take 81 mg by mouth daily.    Yes Historical Provider, MD  Biotin 5 MG TABS Take 1 tablet by mouth daily.    Yes Historical Provider, MD  Cholecalciferol (VITAMIN D PO) Take 2,000 mg by mouth daily.    Yes Historical Provider, MD  docusate sodium (COLACE) 100 MG capsule Take 100 mg by mouth daily.   Yes Historical Provider, MD  magnesium hydroxide (MILK OF MAGNESIA) 400 MG/5ML suspension Take 15 mLs by mouth daily as needed for mild constipation.   Yes Historical Provider, MD  memantine (NAMENDA) 10 MG tablet Take 10 mg by mouth 2 (two) times daily.  01/19/15  Yes Historical Provider, MD  naproxen sodium (ANAPROX) 220 MG tablet Take 220  mg by mouth every 8 (eight) hours as needed (discomfort).    Yes Historical Provider, MD  sertraline (ZOLOFT) 50 MG tablet Take 75 mg by mouth daily.    Yes Historical Provider, MD  silodosin (RAPAFLO) 8 MG CAPS capsule Take 8 mg by mouth daily with breakfast.   Yes Historical Provider, MD  traMADol (ULTRAM) 50 MG tablet Take one table twice daily as needed for pain 11/17/14  Yes Historical Provider, MD  zolpidem (AMBIEN) 5 MG tablet Take 5 mg by mouth at bedtime as needed for sleep.   Yes Historical Provider, MD   BP 130/79 mmHg  Pulse 78  Temp(Src) 97.5 F (36.4 C) (Oral)  Resp 17  SpO2  95% Physical Exam  Constitutional: He appears well-developed and well-nourished.  HENT:  Head: Normocephalic.  Eyes: Pupils are equal, round, and reactive to light.  Neck: Full passive range of motion without pain.  Cardiovascular: Normal rate and regular rhythm.   Pulmonary/Chest: Effort normal and breath sounds normal.  Abdominal: Soft. Bowel sounds are normal.  Musculoskeletal: Normal range of motion.  Neurological: He is alert.  Skin: Skin is warm and dry. No rash noted. No erythema.  Macerated to the occipital area with no active bleeding  Nursing note and vitals reviewed.   ED Course  LACERATION REPAIR Date/Time: 05/14/2015 3:17 AM Performed by: Junius Creamer Authorized by: Junius Creamer Consent: Verbal consent obtained. Written consent not obtained. Risks and benefits: risks, benefits and alternatives were discussed Consent given by: patient Patient understanding: patient states understanding of the procedure being performed Patient identity confirmed: arm band Time out: Immediately prior to procedure a "time out" was called to verify the correct patient, procedure, equipment, support staff and site/side marked as required. Body area: head/neck Location details: scalp Laceration length: 1.5 cm Foreign bodies: unknown Tendon involvement: none Nerve involvement: none Vascular damage: no Anesthetic total: 0 ml Patient sedated: no Irrigation solution: saline Amount of cleaning: standard Debridement: none Degree of undermining: none Skin closure: staples Number of sutures: 2 Technique: simple Approximation: close Patient tolerance: Patient tolerated the procedure well with no immediate complications   (including critical care time) Labs Review Labs Reviewed - No data to display  Imaging Review Ct Head Wo Contrast  05/14/2015   CLINICAL DATA:  Unwitnessed fall with hematoma to posterior head. Dementia patient.  EXAM: CT HEAD WITHOUT CONTRAST  CT CERVICAL SPINE  WITHOUT CONTRAST  TECHNIQUE: Multidetector CT imaging of the head and cervical spine was performed following the standard protocol without intravenous contrast. Multiplanar CT image reconstructions of the cervical spine were also generated.  COMPARISON:  Head CT and brain MRI 01/12/2013  FINDINGS: CT HEAD FINDINGS  Generalized atrophy, mildly progressed from prior. White matter change is stable. No intracranial hemorrhage, mass effect, or midline shift. No hydrocephalus. The basilar cisterns are patent. No evidence of territorial infarct. No intracranial fluid collection. Calvarium is intact. Included paranasal sinuses and mastoid air cells are well aerated.  CT CERVICAL SPINE FINDINGS  Cervical spine alignment is maintained. Vertebral body heights are preserved. There is no fracture. The dens is intact. There are no jumped or perched facets. Disc space narrowing at C3-C4 and C6-C7. Scattered facet arthropathy. No prevertebral soft tissue edema. Biapical pleural parenchymal scarring, right greater than left.  IMPRESSION: 1. No acute intracranial abnormality. Progressive atrophy from prior, white matter change is stable. 2. Mild degenerative change in the cervical spine without acute fracture.   Electronically Signed   By: Fonnie Birkenhead.D.  On: 05/14/2015 02:00   Ct Cervical Spine Wo Contrast  05/14/2015   CLINICAL DATA:  Unwitnessed fall with hematoma to posterior head. Dementia patient.  EXAM: CT HEAD WITHOUT CONTRAST  CT CERVICAL SPINE WITHOUT CONTRAST  TECHNIQUE: Multidetector CT imaging of the head and cervical spine was performed following the standard protocol without intravenous contrast. Multiplanar CT image reconstructions of the cervical spine were also generated.  COMPARISON:  Head CT and brain MRI 01/12/2013  FINDINGS: CT HEAD FINDINGS  Generalized atrophy, mildly progressed from prior. White matter change is stable. No intracranial hemorrhage, mass effect, or midline shift. No hydrocephalus.  The basilar cisterns are patent. No evidence of territorial infarct. No intracranial fluid collection. Calvarium is intact. Included paranasal sinuses and mastoid air cells are well aerated.  CT CERVICAL SPINE FINDINGS  Cervical spine alignment is maintained. Vertebral body heights are preserved. There is no fracture. The dens is intact. There are no jumped or perched facets. Disc space narrowing at C3-C4 and C6-C7. Scattered facet arthropathy. No prevertebral soft tissue edema. Biapical pleural parenchymal scarring, right greater than left.  IMPRESSION: 1. No acute intracranial abnormality. Progressive atrophy from prior, white matter change is stable. 2. Mild degenerative change in the cervical spine without acute fracture.   Electronically Signed   By: Jeb Levering M.D.   On: 05/14/2015 02:00   I have personally reviewed and evaluated these images and lab results as part of my medical decision-making.   EKG Interpretation None    laceration explored 1.5 cm with one end deeper than the rest when washed vigourisly bled slightly so staples placed to stem the bleeding They will need   MDM   Final diagnoses:  Fall, initial encounter  Scalp laceration, initial encounter         Junius Creamer, NP 05/14/15 2004  Rolland Porter, MD 05/15/15 [id number]

## 2015-05-15 DIAGNOSIS — G8191 Hemiplegia, unspecified affecting right dominant side: Secondary | ICD-10-CM | POA: Diagnosis not present

## 2015-05-15 DIAGNOSIS — M6281 Muscle weakness (generalized): Secondary | ICD-10-CM | POA: Diagnosis not present

## 2015-05-15 DIAGNOSIS — G3281 Cerebellar ataxia in diseases classified elsewhere: Secondary | ICD-10-CM | POA: Diagnosis not present

## 2015-05-15 DIAGNOSIS — R413 Other amnesia: Secondary | ICD-10-CM | POA: Diagnosis not present

## 2015-05-15 DIAGNOSIS — R278 Other lack of coordination: Secondary | ICD-10-CM | POA: Diagnosis not present

## 2015-05-20 DIAGNOSIS — R413 Other amnesia: Secondary | ICD-10-CM | POA: Diagnosis not present

## 2015-05-20 DIAGNOSIS — G8191 Hemiplegia, unspecified affecting right dominant side: Secondary | ICD-10-CM | POA: Diagnosis not present

## 2015-05-20 DIAGNOSIS — M6281 Muscle weakness (generalized): Secondary | ICD-10-CM | POA: Diagnosis not present

## 2015-05-20 DIAGNOSIS — R278 Other lack of coordination: Secondary | ICD-10-CM | POA: Diagnosis not present

## 2015-05-20 DIAGNOSIS — G3281 Cerebellar ataxia in diseases classified elsewhere: Secondary | ICD-10-CM | POA: Diagnosis not present

## 2015-05-22 DIAGNOSIS — M6281 Muscle weakness (generalized): Secondary | ICD-10-CM | POA: Diagnosis not present

## 2015-05-22 DIAGNOSIS — G8191 Hemiplegia, unspecified affecting right dominant side: Secondary | ICD-10-CM | POA: Diagnosis not present

## 2015-05-22 DIAGNOSIS — G3281 Cerebellar ataxia in diseases classified elsewhere: Secondary | ICD-10-CM | POA: Diagnosis not present

## 2015-05-22 DIAGNOSIS — R278 Other lack of coordination: Secondary | ICD-10-CM | POA: Diagnosis not present

## 2015-05-22 DIAGNOSIS — R413 Other amnesia: Secondary | ICD-10-CM | POA: Diagnosis not present

## 2015-05-27 DIAGNOSIS — R278 Other lack of coordination: Secondary | ICD-10-CM | POA: Diagnosis not present

## 2015-05-27 DIAGNOSIS — M6281 Muscle weakness (generalized): Secondary | ICD-10-CM | POA: Diagnosis not present

## 2015-05-27 DIAGNOSIS — G8191 Hemiplegia, unspecified affecting right dominant side: Secondary | ICD-10-CM | POA: Diagnosis not present

## 2015-05-27 DIAGNOSIS — G3281 Cerebellar ataxia in diseases classified elsewhere: Secondary | ICD-10-CM | POA: Diagnosis not present

## 2015-05-27 DIAGNOSIS — R413 Other amnesia: Secondary | ICD-10-CM | POA: Diagnosis not present

## 2015-05-28 DIAGNOSIS — G8191 Hemiplegia, unspecified affecting right dominant side: Secondary | ICD-10-CM | POA: Diagnosis not present

## 2015-05-28 DIAGNOSIS — R413 Other amnesia: Secondary | ICD-10-CM | POA: Diagnosis not present

## 2015-05-28 DIAGNOSIS — R278 Other lack of coordination: Secondary | ICD-10-CM | POA: Diagnosis not present

## 2015-05-28 DIAGNOSIS — G3281 Cerebellar ataxia in diseases classified elsewhere: Secondary | ICD-10-CM | POA: Diagnosis not present

## 2015-05-28 DIAGNOSIS — M6281 Muscle weakness (generalized): Secondary | ICD-10-CM | POA: Diagnosis not present

## 2015-06-02 DIAGNOSIS — M6281 Muscle weakness (generalized): Secondary | ICD-10-CM | POA: Diagnosis not present

## 2015-06-02 DIAGNOSIS — R278 Other lack of coordination: Secondary | ICD-10-CM | POA: Diagnosis not present

## 2015-06-02 DIAGNOSIS — G8191 Hemiplegia, unspecified affecting right dominant side: Secondary | ICD-10-CM | POA: Diagnosis not present

## 2015-06-02 DIAGNOSIS — R413 Other amnesia: Secondary | ICD-10-CM | POA: Diagnosis not present

## 2015-06-02 DIAGNOSIS — G3281 Cerebellar ataxia in diseases classified elsewhere: Secondary | ICD-10-CM | POA: Diagnosis not present

## 2015-06-04 DIAGNOSIS — R413 Other amnesia: Secondary | ICD-10-CM | POA: Diagnosis not present

## 2015-06-04 DIAGNOSIS — R278 Other lack of coordination: Secondary | ICD-10-CM | POA: Diagnosis not present

## 2015-06-04 DIAGNOSIS — G8191 Hemiplegia, unspecified affecting right dominant side: Secondary | ICD-10-CM | POA: Diagnosis not present

## 2015-06-04 DIAGNOSIS — G3281 Cerebellar ataxia in diseases classified elsewhere: Secondary | ICD-10-CM | POA: Diagnosis not present

## 2015-06-04 DIAGNOSIS — M6281 Muscle weakness (generalized): Secondary | ICD-10-CM | POA: Diagnosis not present

## 2015-06-09 DIAGNOSIS — R413 Other amnesia: Secondary | ICD-10-CM | POA: Diagnosis not present

## 2015-06-09 DIAGNOSIS — R278 Other lack of coordination: Secondary | ICD-10-CM | POA: Diagnosis not present

## 2015-06-09 DIAGNOSIS — G8191 Hemiplegia, unspecified affecting right dominant side: Secondary | ICD-10-CM | POA: Diagnosis not present

## 2015-06-09 DIAGNOSIS — M6281 Muscle weakness (generalized): Secondary | ICD-10-CM | POA: Diagnosis not present

## 2015-06-09 DIAGNOSIS — G3281 Cerebellar ataxia in diseases classified elsewhere: Secondary | ICD-10-CM | POA: Diagnosis not present

## 2015-06-10 DIAGNOSIS — R278 Other lack of coordination: Secondary | ICD-10-CM | POA: Diagnosis not present

## 2015-06-10 DIAGNOSIS — M6281 Muscle weakness (generalized): Secondary | ICD-10-CM | POA: Diagnosis not present

## 2015-06-10 DIAGNOSIS — G8191 Hemiplegia, unspecified affecting right dominant side: Secondary | ICD-10-CM | POA: Diagnosis not present

## 2015-06-10 DIAGNOSIS — R413 Other amnesia: Secondary | ICD-10-CM | POA: Diagnosis not present

## 2015-06-10 DIAGNOSIS — G3281 Cerebellar ataxia in diseases classified elsewhere: Secondary | ICD-10-CM | POA: Diagnosis not present

## 2015-06-12 DIAGNOSIS — G3281 Cerebellar ataxia in diseases classified elsewhere: Secondary | ICD-10-CM | POA: Diagnosis not present

## 2015-06-12 DIAGNOSIS — G8191 Hemiplegia, unspecified affecting right dominant side: Secondary | ICD-10-CM | POA: Diagnosis not present

## 2015-06-12 DIAGNOSIS — R278 Other lack of coordination: Secondary | ICD-10-CM | POA: Diagnosis not present

## 2015-06-12 DIAGNOSIS — M6281 Muscle weakness (generalized): Secondary | ICD-10-CM | POA: Diagnosis not present

## 2015-06-12 DIAGNOSIS — R413 Other amnesia: Secondary | ICD-10-CM | POA: Diagnosis not present

## 2015-06-16 DIAGNOSIS — R413 Other amnesia: Secondary | ICD-10-CM | POA: Diagnosis not present

## 2015-06-16 DIAGNOSIS — G3281 Cerebellar ataxia in diseases classified elsewhere: Secondary | ICD-10-CM | POA: Diagnosis not present

## 2015-06-16 DIAGNOSIS — G8191 Hemiplegia, unspecified affecting right dominant side: Secondary | ICD-10-CM | POA: Diagnosis not present

## 2015-06-16 DIAGNOSIS — R278 Other lack of coordination: Secondary | ICD-10-CM | POA: Diagnosis not present

## 2015-06-16 DIAGNOSIS — M6281 Muscle weakness (generalized): Secondary | ICD-10-CM | POA: Diagnosis not present

## 2015-06-17 DIAGNOSIS — R413 Other amnesia: Secondary | ICD-10-CM | POA: Diagnosis not present

## 2015-06-17 DIAGNOSIS — G8191 Hemiplegia, unspecified affecting right dominant side: Secondary | ICD-10-CM | POA: Diagnosis not present

## 2015-06-17 DIAGNOSIS — R278 Other lack of coordination: Secondary | ICD-10-CM | POA: Diagnosis not present

## 2015-06-17 DIAGNOSIS — M6281 Muscle weakness (generalized): Secondary | ICD-10-CM | POA: Diagnosis not present

## 2015-06-17 DIAGNOSIS — G3281 Cerebellar ataxia in diseases classified elsewhere: Secondary | ICD-10-CM | POA: Diagnosis not present

## 2015-06-18 DIAGNOSIS — G3281 Cerebellar ataxia in diseases classified elsewhere: Secondary | ICD-10-CM | POA: Diagnosis not present

## 2015-06-18 DIAGNOSIS — G8191 Hemiplegia, unspecified affecting right dominant side: Secondary | ICD-10-CM | POA: Diagnosis not present

## 2015-06-18 DIAGNOSIS — R278 Other lack of coordination: Secondary | ICD-10-CM | POA: Diagnosis not present

## 2015-06-18 DIAGNOSIS — R413 Other amnesia: Secondary | ICD-10-CM | POA: Diagnosis not present

## 2015-06-18 DIAGNOSIS — M6281 Muscle weakness (generalized): Secondary | ICD-10-CM | POA: Diagnosis not present

## 2015-06-19 DIAGNOSIS — M6281 Muscle weakness (generalized): Secondary | ICD-10-CM | POA: Diagnosis not present

## 2015-06-19 DIAGNOSIS — G3281 Cerebellar ataxia in diseases classified elsewhere: Secondary | ICD-10-CM | POA: Diagnosis not present

## 2015-06-19 DIAGNOSIS — G8191 Hemiplegia, unspecified affecting right dominant side: Secondary | ICD-10-CM | POA: Diagnosis not present

## 2015-06-19 DIAGNOSIS — L219 Seborrheic dermatitis, unspecified: Secondary | ICD-10-CM | POA: Diagnosis not present

## 2015-06-19 DIAGNOSIS — R278 Other lack of coordination: Secondary | ICD-10-CM | POA: Diagnosis not present

## 2015-06-19 DIAGNOSIS — L578 Other skin changes due to chronic exposure to nonionizing radiation: Secondary | ICD-10-CM | POA: Diagnosis not present

## 2015-06-19 DIAGNOSIS — L570 Actinic keratosis: Secondary | ICD-10-CM | POA: Diagnosis not present

## 2015-06-19 DIAGNOSIS — R413 Other amnesia: Secondary | ICD-10-CM | POA: Diagnosis not present

## 2015-06-19 DIAGNOSIS — L814 Other melanin hyperpigmentation: Secondary | ICD-10-CM | POA: Diagnosis not present

## 2015-06-22 DIAGNOSIS — M6281 Muscle weakness (generalized): Secondary | ICD-10-CM | POA: Diagnosis not present

## 2015-06-22 DIAGNOSIS — R413 Other amnesia: Secondary | ICD-10-CM | POA: Diagnosis not present

## 2015-06-22 DIAGNOSIS — R278 Other lack of coordination: Secondary | ICD-10-CM | POA: Diagnosis not present

## 2015-06-22 DIAGNOSIS — G3281 Cerebellar ataxia in diseases classified elsewhere: Secondary | ICD-10-CM | POA: Diagnosis not present

## 2015-06-22 DIAGNOSIS — G8191 Hemiplegia, unspecified affecting right dominant side: Secondary | ICD-10-CM | POA: Diagnosis not present

## 2015-06-23 DIAGNOSIS — R278 Other lack of coordination: Secondary | ICD-10-CM | POA: Diagnosis not present

## 2015-06-23 DIAGNOSIS — R413 Other amnesia: Secondary | ICD-10-CM | POA: Diagnosis not present

## 2015-06-23 DIAGNOSIS — G8191 Hemiplegia, unspecified affecting right dominant side: Secondary | ICD-10-CM | POA: Diagnosis not present

## 2015-06-23 DIAGNOSIS — G3281 Cerebellar ataxia in diseases classified elsewhere: Secondary | ICD-10-CM | POA: Diagnosis not present

## 2015-06-23 DIAGNOSIS — M6281 Muscle weakness (generalized): Secondary | ICD-10-CM | POA: Diagnosis not present

## 2015-06-24 DIAGNOSIS — Z23 Encounter for immunization: Secondary | ICD-10-CM | POA: Diagnosis not present

## 2015-06-24 DIAGNOSIS — G8191 Hemiplegia, unspecified affecting right dominant side: Secondary | ICD-10-CM | POA: Diagnosis not present

## 2015-06-24 DIAGNOSIS — M6281 Muscle weakness (generalized): Secondary | ICD-10-CM | POA: Diagnosis not present

## 2015-06-24 DIAGNOSIS — G3281 Cerebellar ataxia in diseases classified elsewhere: Secondary | ICD-10-CM | POA: Diagnosis not present

## 2015-06-24 DIAGNOSIS — R278 Other lack of coordination: Secondary | ICD-10-CM | POA: Diagnosis not present

## 2015-06-24 DIAGNOSIS — R413 Other amnesia: Secondary | ICD-10-CM | POA: Diagnosis not present

## 2015-06-25 DIAGNOSIS — R278 Other lack of coordination: Secondary | ICD-10-CM | POA: Diagnosis not present

## 2015-06-25 DIAGNOSIS — G3281 Cerebellar ataxia in diseases classified elsewhere: Secondary | ICD-10-CM | POA: Diagnosis not present

## 2015-06-25 DIAGNOSIS — G8191 Hemiplegia, unspecified affecting right dominant side: Secondary | ICD-10-CM | POA: Diagnosis not present

## 2015-06-25 DIAGNOSIS — M6281 Muscle weakness (generalized): Secondary | ICD-10-CM | POA: Diagnosis not present

## 2015-06-25 DIAGNOSIS — R413 Other amnesia: Secondary | ICD-10-CM | POA: Diagnosis not present

## 2015-06-26 DIAGNOSIS — R278 Other lack of coordination: Secondary | ICD-10-CM | POA: Diagnosis not present

## 2015-06-26 DIAGNOSIS — R413 Other amnesia: Secondary | ICD-10-CM | POA: Diagnosis not present

## 2015-06-26 DIAGNOSIS — M6281 Muscle weakness (generalized): Secondary | ICD-10-CM | POA: Diagnosis not present

## 2015-06-26 DIAGNOSIS — G8191 Hemiplegia, unspecified affecting right dominant side: Secondary | ICD-10-CM | POA: Diagnosis not present

## 2015-06-26 DIAGNOSIS — G3281 Cerebellar ataxia in diseases classified elsewhere: Secondary | ICD-10-CM | POA: Diagnosis not present

## 2015-06-29 DIAGNOSIS — R413 Other amnesia: Secondary | ICD-10-CM | POA: Diagnosis not present

## 2015-06-29 DIAGNOSIS — R278 Other lack of coordination: Secondary | ICD-10-CM | POA: Diagnosis not present

## 2015-06-29 DIAGNOSIS — G8191 Hemiplegia, unspecified affecting right dominant side: Secondary | ICD-10-CM | POA: Diagnosis not present

## 2015-06-29 DIAGNOSIS — G3281 Cerebellar ataxia in diseases classified elsewhere: Secondary | ICD-10-CM | POA: Diagnosis not present

## 2015-06-29 DIAGNOSIS — M6281 Muscle weakness (generalized): Secondary | ICD-10-CM | POA: Diagnosis not present

## 2015-06-30 DIAGNOSIS — R278 Other lack of coordination: Secondary | ICD-10-CM | POA: Diagnosis not present

## 2015-06-30 DIAGNOSIS — M6281 Muscle weakness (generalized): Secondary | ICD-10-CM | POA: Diagnosis not present

## 2015-06-30 DIAGNOSIS — R413 Other amnesia: Secondary | ICD-10-CM | POA: Diagnosis not present

## 2015-06-30 DIAGNOSIS — G8191 Hemiplegia, unspecified affecting right dominant side: Secondary | ICD-10-CM | POA: Diagnosis not present

## 2015-06-30 DIAGNOSIS — G3281 Cerebellar ataxia in diseases classified elsewhere: Secondary | ICD-10-CM | POA: Diagnosis not present

## 2015-07-01 DIAGNOSIS — G3281 Cerebellar ataxia in diseases classified elsewhere: Secondary | ICD-10-CM | POA: Diagnosis not present

## 2015-07-01 DIAGNOSIS — R413 Other amnesia: Secondary | ICD-10-CM | POA: Diagnosis not present

## 2015-07-01 DIAGNOSIS — M6281 Muscle weakness (generalized): Secondary | ICD-10-CM | POA: Diagnosis not present

## 2015-07-01 DIAGNOSIS — R278 Other lack of coordination: Secondary | ICD-10-CM | POA: Diagnosis not present

## 2015-07-01 DIAGNOSIS — G8191 Hemiplegia, unspecified affecting right dominant side: Secondary | ICD-10-CM | POA: Diagnosis not present

## 2015-07-02 DIAGNOSIS — G3281 Cerebellar ataxia in diseases classified elsewhere: Secondary | ICD-10-CM | POA: Diagnosis not present

## 2015-07-02 DIAGNOSIS — R278 Other lack of coordination: Secondary | ICD-10-CM | POA: Diagnosis not present

## 2015-07-02 DIAGNOSIS — R413 Other amnesia: Secondary | ICD-10-CM | POA: Diagnosis not present

## 2015-07-02 DIAGNOSIS — G8191 Hemiplegia, unspecified affecting right dominant side: Secondary | ICD-10-CM | POA: Diagnosis not present

## 2015-07-02 DIAGNOSIS — M6281 Muscle weakness (generalized): Secondary | ICD-10-CM | POA: Diagnosis not present

## 2015-07-03 DIAGNOSIS — G8191 Hemiplegia, unspecified affecting right dominant side: Secondary | ICD-10-CM | POA: Diagnosis not present

## 2015-07-03 DIAGNOSIS — M6281 Muscle weakness (generalized): Secondary | ICD-10-CM | POA: Diagnosis not present

## 2015-07-03 DIAGNOSIS — R413 Other amnesia: Secondary | ICD-10-CM | POA: Diagnosis not present

## 2015-07-03 DIAGNOSIS — R278 Other lack of coordination: Secondary | ICD-10-CM | POA: Diagnosis not present

## 2015-07-03 DIAGNOSIS — G3281 Cerebellar ataxia in diseases classified elsewhere: Secondary | ICD-10-CM | POA: Diagnosis not present

## 2015-07-06 DIAGNOSIS — R278 Other lack of coordination: Secondary | ICD-10-CM | POA: Diagnosis not present

## 2015-07-06 DIAGNOSIS — R413 Other amnesia: Secondary | ICD-10-CM | POA: Diagnosis not present

## 2015-07-06 DIAGNOSIS — G3281 Cerebellar ataxia in diseases classified elsewhere: Secondary | ICD-10-CM | POA: Diagnosis not present

## 2015-07-06 DIAGNOSIS — G8191 Hemiplegia, unspecified affecting right dominant side: Secondary | ICD-10-CM | POA: Diagnosis not present

## 2015-07-06 DIAGNOSIS — M6281 Muscle weakness (generalized): Secondary | ICD-10-CM | POA: Diagnosis not present

## 2015-07-07 DIAGNOSIS — M6281 Muscle weakness (generalized): Secondary | ICD-10-CM | POA: Diagnosis not present

## 2015-07-07 DIAGNOSIS — R278 Other lack of coordination: Secondary | ICD-10-CM | POA: Diagnosis not present

## 2015-07-07 DIAGNOSIS — R413 Other amnesia: Secondary | ICD-10-CM | POA: Diagnosis not present

## 2015-07-07 DIAGNOSIS — G3281 Cerebellar ataxia in diseases classified elsewhere: Secondary | ICD-10-CM | POA: Diagnosis not present

## 2015-07-07 DIAGNOSIS — G8191 Hemiplegia, unspecified affecting right dominant side: Secondary | ICD-10-CM | POA: Diagnosis not present

## 2015-07-08 DIAGNOSIS — G8191 Hemiplegia, unspecified affecting right dominant side: Secondary | ICD-10-CM | POA: Diagnosis not present

## 2015-07-08 DIAGNOSIS — R278 Other lack of coordination: Secondary | ICD-10-CM | POA: Diagnosis not present

## 2015-07-08 DIAGNOSIS — M6281 Muscle weakness (generalized): Secondary | ICD-10-CM | POA: Diagnosis not present

## 2015-07-08 DIAGNOSIS — G3281 Cerebellar ataxia in diseases classified elsewhere: Secondary | ICD-10-CM | POA: Diagnosis not present

## 2015-07-08 DIAGNOSIS — R413 Other amnesia: Secondary | ICD-10-CM | POA: Diagnosis not present

## 2015-07-09 DIAGNOSIS — M6281 Muscle weakness (generalized): Secondary | ICD-10-CM | POA: Diagnosis not present

## 2015-07-09 DIAGNOSIS — G3281 Cerebellar ataxia in diseases classified elsewhere: Secondary | ICD-10-CM | POA: Diagnosis not present

## 2015-07-09 DIAGNOSIS — R278 Other lack of coordination: Secondary | ICD-10-CM | POA: Diagnosis not present

## 2015-07-09 DIAGNOSIS — G8191 Hemiplegia, unspecified affecting right dominant side: Secondary | ICD-10-CM | POA: Diagnosis not present

## 2015-07-09 DIAGNOSIS — R413 Other amnesia: Secondary | ICD-10-CM | POA: Diagnosis not present

## 2015-07-10 DIAGNOSIS — G3281 Cerebellar ataxia in diseases classified elsewhere: Secondary | ICD-10-CM | POA: Diagnosis not present

## 2015-07-10 DIAGNOSIS — G8191 Hemiplegia, unspecified affecting right dominant side: Secondary | ICD-10-CM | POA: Diagnosis not present

## 2015-07-10 DIAGNOSIS — M6281 Muscle weakness (generalized): Secondary | ICD-10-CM | POA: Diagnosis not present

## 2015-07-10 DIAGNOSIS — R278 Other lack of coordination: Secondary | ICD-10-CM | POA: Diagnosis not present

## 2015-07-10 DIAGNOSIS — R413 Other amnesia: Secondary | ICD-10-CM | POA: Diagnosis not present

## 2015-07-13 DIAGNOSIS — R413 Other amnesia: Secondary | ICD-10-CM | POA: Diagnosis not present

## 2015-07-13 DIAGNOSIS — G8191 Hemiplegia, unspecified affecting right dominant side: Secondary | ICD-10-CM | POA: Diagnosis not present

## 2015-07-13 DIAGNOSIS — G3281 Cerebellar ataxia in diseases classified elsewhere: Secondary | ICD-10-CM | POA: Diagnosis not present

## 2015-07-13 DIAGNOSIS — R278 Other lack of coordination: Secondary | ICD-10-CM | POA: Diagnosis not present

## 2015-07-13 DIAGNOSIS — M6281 Muscle weakness (generalized): Secondary | ICD-10-CM | POA: Diagnosis not present

## 2015-07-14 DIAGNOSIS — G3281 Cerebellar ataxia in diseases classified elsewhere: Secondary | ICD-10-CM | POA: Diagnosis not present

## 2015-07-14 DIAGNOSIS — G8191 Hemiplegia, unspecified affecting right dominant side: Secondary | ICD-10-CM | POA: Diagnosis not present

## 2015-07-14 DIAGNOSIS — M6281 Muscle weakness (generalized): Secondary | ICD-10-CM | POA: Diagnosis not present

## 2015-07-14 DIAGNOSIS — R413 Other amnesia: Secondary | ICD-10-CM | POA: Diagnosis not present

## 2015-07-14 DIAGNOSIS — R278 Other lack of coordination: Secondary | ICD-10-CM | POA: Diagnosis not present

## 2015-07-15 DIAGNOSIS — G3281 Cerebellar ataxia in diseases classified elsewhere: Secondary | ICD-10-CM | POA: Diagnosis not present

## 2015-07-15 DIAGNOSIS — M6281 Muscle weakness (generalized): Secondary | ICD-10-CM | POA: Diagnosis not present

## 2015-07-15 DIAGNOSIS — R278 Other lack of coordination: Secondary | ICD-10-CM | POA: Diagnosis not present

## 2015-07-15 DIAGNOSIS — G8191 Hemiplegia, unspecified affecting right dominant side: Secondary | ICD-10-CM | POA: Diagnosis not present

## 2015-07-15 DIAGNOSIS — R413 Other amnesia: Secondary | ICD-10-CM | POA: Diagnosis not present

## 2015-07-16 DIAGNOSIS — R278 Other lack of coordination: Secondary | ICD-10-CM | POA: Diagnosis not present

## 2015-07-16 DIAGNOSIS — G8191 Hemiplegia, unspecified affecting right dominant side: Secondary | ICD-10-CM | POA: Diagnosis not present

## 2015-07-16 DIAGNOSIS — G3281 Cerebellar ataxia in diseases classified elsewhere: Secondary | ICD-10-CM | POA: Diagnosis not present

## 2015-07-16 DIAGNOSIS — R413 Other amnesia: Secondary | ICD-10-CM | POA: Diagnosis not present

## 2015-07-16 DIAGNOSIS — M6281 Muscle weakness (generalized): Secondary | ICD-10-CM | POA: Diagnosis not present

## 2015-07-17 DIAGNOSIS — G8191 Hemiplegia, unspecified affecting right dominant side: Secondary | ICD-10-CM | POA: Diagnosis not present

## 2015-07-17 DIAGNOSIS — M6281 Muscle weakness (generalized): Secondary | ICD-10-CM | POA: Diagnosis not present

## 2015-07-17 DIAGNOSIS — R413 Other amnesia: Secondary | ICD-10-CM | POA: Diagnosis not present

## 2015-07-17 DIAGNOSIS — G3281 Cerebellar ataxia in diseases classified elsewhere: Secondary | ICD-10-CM | POA: Diagnosis not present

## 2015-07-17 DIAGNOSIS — R278 Other lack of coordination: Secondary | ICD-10-CM | POA: Diagnosis not present

## 2015-07-20 DIAGNOSIS — G8191 Hemiplegia, unspecified affecting right dominant side: Secondary | ICD-10-CM | POA: Diagnosis not present

## 2015-07-20 DIAGNOSIS — R278 Other lack of coordination: Secondary | ICD-10-CM | POA: Diagnosis not present

## 2015-07-20 DIAGNOSIS — G3281 Cerebellar ataxia in diseases classified elsewhere: Secondary | ICD-10-CM | POA: Diagnosis not present

## 2015-07-20 DIAGNOSIS — M6281 Muscle weakness (generalized): Secondary | ICD-10-CM | POA: Diagnosis not present

## 2015-07-20 DIAGNOSIS — R413 Other amnesia: Secondary | ICD-10-CM | POA: Diagnosis not present

## 2015-07-21 DIAGNOSIS — M6281 Muscle weakness (generalized): Secondary | ICD-10-CM | POA: Diagnosis not present

## 2015-07-21 DIAGNOSIS — R413 Other amnesia: Secondary | ICD-10-CM | POA: Diagnosis not present

## 2015-07-21 DIAGNOSIS — G3281 Cerebellar ataxia in diseases classified elsewhere: Secondary | ICD-10-CM | POA: Diagnosis not present

## 2015-07-21 DIAGNOSIS — G8191 Hemiplegia, unspecified affecting right dominant side: Secondary | ICD-10-CM | POA: Diagnosis not present

## 2015-07-21 DIAGNOSIS — R278 Other lack of coordination: Secondary | ICD-10-CM | POA: Diagnosis not present

## 2015-07-22 DIAGNOSIS — R413 Other amnesia: Secondary | ICD-10-CM | POA: Diagnosis not present

## 2015-07-22 DIAGNOSIS — G8191 Hemiplegia, unspecified affecting right dominant side: Secondary | ICD-10-CM | POA: Diagnosis not present

## 2015-07-22 DIAGNOSIS — M6281 Muscle weakness (generalized): Secondary | ICD-10-CM | POA: Diagnosis not present

## 2015-07-22 DIAGNOSIS — R278 Other lack of coordination: Secondary | ICD-10-CM | POA: Diagnosis not present

## 2015-07-22 DIAGNOSIS — G3281 Cerebellar ataxia in diseases classified elsewhere: Secondary | ICD-10-CM | POA: Diagnosis not present

## 2015-07-23 DIAGNOSIS — R413 Other amnesia: Secondary | ICD-10-CM | POA: Diagnosis not present

## 2015-07-23 DIAGNOSIS — G8191 Hemiplegia, unspecified affecting right dominant side: Secondary | ICD-10-CM | POA: Diagnosis not present

## 2015-07-23 DIAGNOSIS — G3281 Cerebellar ataxia in diseases classified elsewhere: Secondary | ICD-10-CM | POA: Diagnosis not present

## 2015-07-23 DIAGNOSIS — M6281 Muscle weakness (generalized): Secondary | ICD-10-CM | POA: Diagnosis not present

## 2015-07-23 DIAGNOSIS — R278 Other lack of coordination: Secondary | ICD-10-CM | POA: Diagnosis not present

## 2015-07-24 DIAGNOSIS — M6281 Muscle weakness (generalized): Secondary | ICD-10-CM | POA: Diagnosis not present

## 2015-07-24 DIAGNOSIS — R278 Other lack of coordination: Secondary | ICD-10-CM | POA: Diagnosis not present

## 2015-07-24 DIAGNOSIS — G3281 Cerebellar ataxia in diseases classified elsewhere: Secondary | ICD-10-CM | POA: Diagnosis not present

## 2015-07-24 DIAGNOSIS — R413 Other amnesia: Secondary | ICD-10-CM | POA: Diagnosis not present

## 2015-07-24 DIAGNOSIS — G8191 Hemiplegia, unspecified affecting right dominant side: Secondary | ICD-10-CM | POA: Diagnosis not present

## 2015-07-27 DIAGNOSIS — R278 Other lack of coordination: Secondary | ICD-10-CM | POA: Diagnosis not present

## 2015-07-27 DIAGNOSIS — G8191 Hemiplegia, unspecified affecting right dominant side: Secondary | ICD-10-CM | POA: Diagnosis not present

## 2015-07-27 DIAGNOSIS — M6281 Muscle weakness (generalized): Secondary | ICD-10-CM | POA: Diagnosis not present

## 2015-07-27 DIAGNOSIS — G3281 Cerebellar ataxia in diseases classified elsewhere: Secondary | ICD-10-CM | POA: Diagnosis not present

## 2015-07-27 DIAGNOSIS — R413 Other amnesia: Secondary | ICD-10-CM | POA: Diagnosis not present

## 2015-07-29 DIAGNOSIS — G3281 Cerebellar ataxia in diseases classified elsewhere: Secondary | ICD-10-CM | POA: Diagnosis not present

## 2015-07-29 DIAGNOSIS — G8191 Hemiplegia, unspecified affecting right dominant side: Secondary | ICD-10-CM | POA: Diagnosis not present

## 2015-07-29 DIAGNOSIS — R278 Other lack of coordination: Secondary | ICD-10-CM | POA: Diagnosis not present

## 2015-07-29 DIAGNOSIS — M6281 Muscle weakness (generalized): Secondary | ICD-10-CM | POA: Diagnosis not present

## 2015-07-29 DIAGNOSIS — R413 Other amnesia: Secondary | ICD-10-CM | POA: Diagnosis not present

## 2015-07-30 DIAGNOSIS — R413 Other amnesia: Secondary | ICD-10-CM | POA: Diagnosis not present

## 2015-07-30 DIAGNOSIS — G8191 Hemiplegia, unspecified affecting right dominant side: Secondary | ICD-10-CM | POA: Diagnosis not present

## 2015-07-30 DIAGNOSIS — G3281 Cerebellar ataxia in diseases classified elsewhere: Secondary | ICD-10-CM | POA: Diagnosis not present

## 2015-07-30 DIAGNOSIS — R278 Other lack of coordination: Secondary | ICD-10-CM | POA: Diagnosis not present

## 2015-07-30 DIAGNOSIS — M6281 Muscle weakness (generalized): Secondary | ICD-10-CM | POA: Diagnosis not present

## 2015-07-31 DIAGNOSIS — G8191 Hemiplegia, unspecified affecting right dominant side: Secondary | ICD-10-CM | POA: Diagnosis not present

## 2015-07-31 DIAGNOSIS — G3281 Cerebellar ataxia in diseases classified elsewhere: Secondary | ICD-10-CM | POA: Diagnosis not present

## 2015-07-31 DIAGNOSIS — M6281 Muscle weakness (generalized): Secondary | ICD-10-CM | POA: Diagnosis not present

## 2015-07-31 DIAGNOSIS — R413 Other amnesia: Secondary | ICD-10-CM | POA: Diagnosis not present

## 2015-07-31 DIAGNOSIS — R278 Other lack of coordination: Secondary | ICD-10-CM | POA: Diagnosis not present

## 2015-08-01 DIAGNOSIS — R278 Other lack of coordination: Secondary | ICD-10-CM | POA: Diagnosis not present

## 2015-08-01 DIAGNOSIS — G8191 Hemiplegia, unspecified affecting right dominant side: Secondary | ICD-10-CM | POA: Diagnosis not present

## 2015-08-01 DIAGNOSIS — M6281 Muscle weakness (generalized): Secondary | ICD-10-CM | POA: Diagnosis not present

## 2015-08-01 DIAGNOSIS — G3281 Cerebellar ataxia in diseases classified elsewhere: Secondary | ICD-10-CM | POA: Diagnosis not present

## 2015-08-01 DIAGNOSIS — R413 Other amnesia: Secondary | ICD-10-CM | POA: Diagnosis not present

## 2015-08-03 DIAGNOSIS — R278 Other lack of coordination: Secondary | ICD-10-CM | POA: Diagnosis not present

## 2015-08-03 DIAGNOSIS — M6281 Muscle weakness (generalized): Secondary | ICD-10-CM | POA: Diagnosis not present

## 2015-08-03 DIAGNOSIS — G8191 Hemiplegia, unspecified affecting right dominant side: Secondary | ICD-10-CM | POA: Diagnosis not present

## 2015-08-03 DIAGNOSIS — R413 Other amnesia: Secondary | ICD-10-CM | POA: Diagnosis not present

## 2015-08-03 DIAGNOSIS — G3281 Cerebellar ataxia in diseases classified elsewhere: Secondary | ICD-10-CM | POA: Diagnosis not present

## 2015-08-04 DIAGNOSIS — R413 Other amnesia: Secondary | ICD-10-CM | POA: Diagnosis not present

## 2015-08-04 DIAGNOSIS — G8191 Hemiplegia, unspecified affecting right dominant side: Secondary | ICD-10-CM | POA: Diagnosis not present

## 2015-08-04 DIAGNOSIS — G3281 Cerebellar ataxia in diseases classified elsewhere: Secondary | ICD-10-CM | POA: Diagnosis not present

## 2015-08-04 DIAGNOSIS — R278 Other lack of coordination: Secondary | ICD-10-CM | POA: Diagnosis not present

## 2015-08-04 DIAGNOSIS — M6281 Muscle weakness (generalized): Secondary | ICD-10-CM | POA: Diagnosis not present

## 2015-08-05 DIAGNOSIS — M6281 Muscle weakness (generalized): Secondary | ICD-10-CM | POA: Diagnosis not present

## 2015-08-05 DIAGNOSIS — R278 Other lack of coordination: Secondary | ICD-10-CM | POA: Diagnosis not present

## 2015-08-05 DIAGNOSIS — G3281 Cerebellar ataxia in diseases classified elsewhere: Secondary | ICD-10-CM | POA: Diagnosis not present

## 2015-08-05 DIAGNOSIS — R413 Other amnesia: Secondary | ICD-10-CM | POA: Diagnosis not present

## 2015-08-05 DIAGNOSIS — G8191 Hemiplegia, unspecified affecting right dominant side: Secondary | ICD-10-CM | POA: Diagnosis not present

## 2015-08-06 DIAGNOSIS — G3281 Cerebellar ataxia in diseases classified elsewhere: Secondary | ICD-10-CM | POA: Diagnosis not present

## 2015-08-06 DIAGNOSIS — M6281 Muscle weakness (generalized): Secondary | ICD-10-CM | POA: Diagnosis not present

## 2015-08-06 DIAGNOSIS — R413 Other amnesia: Secondary | ICD-10-CM | POA: Diagnosis not present

## 2015-08-06 DIAGNOSIS — R278 Other lack of coordination: Secondary | ICD-10-CM | POA: Diagnosis not present

## 2015-08-06 DIAGNOSIS — G8191 Hemiplegia, unspecified affecting right dominant side: Secondary | ICD-10-CM | POA: Diagnosis not present

## 2015-08-07 DIAGNOSIS — R413 Other amnesia: Secondary | ICD-10-CM | POA: Diagnosis not present

## 2015-08-07 DIAGNOSIS — M6281 Muscle weakness (generalized): Secondary | ICD-10-CM | POA: Diagnosis not present

## 2015-08-07 DIAGNOSIS — L570 Actinic keratosis: Secondary | ICD-10-CM | POA: Diagnosis not present

## 2015-08-07 DIAGNOSIS — L820 Inflamed seborrheic keratosis: Secondary | ICD-10-CM | POA: Diagnosis not present

## 2015-08-07 DIAGNOSIS — L814 Other melanin hyperpigmentation: Secondary | ICD-10-CM | POA: Diagnosis not present

## 2015-08-07 DIAGNOSIS — L578 Other skin changes due to chronic exposure to nonionizing radiation: Secondary | ICD-10-CM | POA: Diagnosis not present

## 2015-08-07 DIAGNOSIS — G3281 Cerebellar ataxia in diseases classified elsewhere: Secondary | ICD-10-CM | POA: Diagnosis not present

## 2015-08-07 DIAGNOSIS — G8191 Hemiplegia, unspecified affecting right dominant side: Secondary | ICD-10-CM | POA: Diagnosis not present

## 2015-08-07 DIAGNOSIS — R278 Other lack of coordination: Secondary | ICD-10-CM | POA: Diagnosis not present

## 2015-08-10 DIAGNOSIS — G8191 Hemiplegia, unspecified affecting right dominant side: Secondary | ICD-10-CM | POA: Diagnosis not present

## 2015-08-10 DIAGNOSIS — M6281 Muscle weakness (generalized): Secondary | ICD-10-CM | POA: Diagnosis not present

## 2015-08-10 DIAGNOSIS — R413 Other amnesia: Secondary | ICD-10-CM | POA: Diagnosis not present

## 2015-08-10 DIAGNOSIS — G3281 Cerebellar ataxia in diseases classified elsewhere: Secondary | ICD-10-CM | POA: Diagnosis not present

## 2015-08-10 DIAGNOSIS — R278 Other lack of coordination: Secondary | ICD-10-CM | POA: Diagnosis not present

## 2015-08-11 DIAGNOSIS — M6281 Muscle weakness (generalized): Secondary | ICD-10-CM | POA: Diagnosis not present

## 2015-08-11 DIAGNOSIS — G8191 Hemiplegia, unspecified affecting right dominant side: Secondary | ICD-10-CM | POA: Diagnosis not present

## 2015-08-11 DIAGNOSIS — R413 Other amnesia: Secondary | ICD-10-CM | POA: Diagnosis not present

## 2015-08-11 DIAGNOSIS — G3281 Cerebellar ataxia in diseases classified elsewhere: Secondary | ICD-10-CM | POA: Diagnosis not present

## 2015-08-11 DIAGNOSIS — R278 Other lack of coordination: Secondary | ICD-10-CM | POA: Diagnosis not present

## 2015-08-12 DIAGNOSIS — R278 Other lack of coordination: Secondary | ICD-10-CM | POA: Diagnosis not present

## 2015-08-12 DIAGNOSIS — M6281 Muscle weakness (generalized): Secondary | ICD-10-CM | POA: Diagnosis not present

## 2015-08-12 DIAGNOSIS — G8191 Hemiplegia, unspecified affecting right dominant side: Secondary | ICD-10-CM | POA: Diagnosis not present

## 2015-08-12 DIAGNOSIS — R413 Other amnesia: Secondary | ICD-10-CM | POA: Diagnosis not present

## 2015-08-12 DIAGNOSIS — G3281 Cerebellar ataxia in diseases classified elsewhere: Secondary | ICD-10-CM | POA: Diagnosis not present

## 2015-08-13 DIAGNOSIS — R278 Other lack of coordination: Secondary | ICD-10-CM | POA: Diagnosis not present

## 2015-08-13 DIAGNOSIS — G8191 Hemiplegia, unspecified affecting right dominant side: Secondary | ICD-10-CM | POA: Diagnosis not present

## 2015-08-13 DIAGNOSIS — R413 Other amnesia: Secondary | ICD-10-CM | POA: Diagnosis not present

## 2015-08-13 DIAGNOSIS — G3281 Cerebellar ataxia in diseases classified elsewhere: Secondary | ICD-10-CM | POA: Diagnosis not present

## 2015-08-13 DIAGNOSIS — M6281 Muscle weakness (generalized): Secondary | ICD-10-CM | POA: Diagnosis not present

## 2015-08-17 DIAGNOSIS — G3281 Cerebellar ataxia in diseases classified elsewhere: Secondary | ICD-10-CM | POA: Diagnosis not present

## 2015-08-17 DIAGNOSIS — G8191 Hemiplegia, unspecified affecting right dominant side: Secondary | ICD-10-CM | POA: Diagnosis not present

## 2015-08-17 DIAGNOSIS — R278 Other lack of coordination: Secondary | ICD-10-CM | POA: Diagnosis not present

## 2015-08-17 DIAGNOSIS — R413 Other amnesia: Secondary | ICD-10-CM | POA: Diagnosis not present

## 2015-08-17 DIAGNOSIS — M6281 Muscle weakness (generalized): Secondary | ICD-10-CM | POA: Diagnosis not present

## 2015-08-18 ENCOUNTER — Encounter: Payer: Medicare Other | Admitting: Internal Medicine

## 2015-08-18 DIAGNOSIS — G8191 Hemiplegia, unspecified affecting right dominant side: Secondary | ICD-10-CM | POA: Diagnosis not present

## 2015-08-18 DIAGNOSIS — G3281 Cerebellar ataxia in diseases classified elsewhere: Secondary | ICD-10-CM | POA: Diagnosis not present

## 2015-08-18 DIAGNOSIS — R278 Other lack of coordination: Secondary | ICD-10-CM | POA: Diagnosis not present

## 2015-08-18 DIAGNOSIS — M6281 Muscle weakness (generalized): Secondary | ICD-10-CM | POA: Diagnosis not present

## 2015-08-18 DIAGNOSIS — R413 Other amnesia: Secondary | ICD-10-CM | POA: Diagnosis not present

## 2015-08-19 ENCOUNTER — Encounter: Payer: Self-pay | Admitting: Internal Medicine

## 2015-08-19 ENCOUNTER — Non-Acute Institutional Stay: Payer: Medicare Other | Admitting: Internal Medicine

## 2015-08-19 VITALS — BP 108/62 | HR 76 | Temp 97.2°F | Ht 72.0 in | Wt 187.0 lb

## 2015-08-19 DIAGNOSIS — N319 Neuromuscular dysfunction of bladder, unspecified: Secondary | ICD-10-CM | POA: Diagnosis not present

## 2015-08-19 DIAGNOSIS — Z0000 Encounter for general adult medical examination without abnormal findings: Secondary | ICD-10-CM | POA: Diagnosis not present

## 2015-08-19 DIAGNOSIS — G35 Multiple sclerosis: Secondary | ICD-10-CM

## 2015-08-19 DIAGNOSIS — R413 Other amnesia: Secondary | ICD-10-CM | POA: Diagnosis not present

## 2015-08-19 DIAGNOSIS — N400 Enlarged prostate without lower urinary tract symptoms: Secondary | ICD-10-CM | POA: Diagnosis not present

## 2015-08-19 DIAGNOSIS — E785 Hyperlipidemia, unspecified: Secondary | ICD-10-CM | POA: Diagnosis not present

## 2015-08-19 DIAGNOSIS — M6281 Muscle weakness (generalized): Secondary | ICD-10-CM | POA: Diagnosis not present

## 2015-08-19 DIAGNOSIS — M21371 Foot drop, right foot: Secondary | ICD-10-CM

## 2015-08-19 DIAGNOSIS — G8191 Hemiplegia, unspecified affecting right dominant side: Secondary | ICD-10-CM | POA: Diagnosis not present

## 2015-08-19 DIAGNOSIS — R278 Other lack of coordination: Secondary | ICD-10-CM | POA: Diagnosis not present

## 2015-08-19 DIAGNOSIS — R269 Unspecified abnormalities of gait and mobility: Secondary | ICD-10-CM | POA: Diagnosis not present

## 2015-08-19 DIAGNOSIS — G3281 Cerebellar ataxia in diseases classified elsewhere: Secondary | ICD-10-CM | POA: Diagnosis not present

## 2015-08-19 NOTE — Progress Notes (Signed)
Patient ID: [name], male   DOB: 1940/09/30, 74 y.o.   MRN: [id number]   Location: [facility] Provider: Ynez Eugenio L. Mariea Clonts, D.O., C.M.D.  Code Status: DNR Goals of Care: Advanced Directive information Does patient have an advance directive?: Yes, Type of Advance Directive: [place];Living will  Chief Complaint  Patient presents with  . Annual Exam    Comprehensive exam  . Medical Management of Chronic Issues    MS, COPD, dementia, cholesterol    HPI: Patient is a 74 y.o. male seen in the office today for an annual wellness exam.  He is here with his wife.  They are trying to teach him to use his wheelchair in his room with automatic brakes.  It does not always work to a tee.  He is also learning to transfer.    Depression screen [facility] 2/9 08/19/2015 08/11/2014 07/05/2014 08/19/2012 05/07/2012  Decreased Interest 0 0 1 0 0  Down, Depressed, Hopeless 0 0 1 0 0  PHQ - 2 Score 0 0 2 0 0  Altered sleeping - - 1 - -  Tired, decreased energy - - 3 - -  Change in appetite - - 0 - -  Feeling bad or failure about yourself  - - 0 - -  Trouble concentrating - - 3 - -  Moving slowly or fidgety/restless - - 3 - -  Suicidal thoughts - - 0 - -  PHQ-9 Score - - 12 - -    Fall Risk  08/19/2015 08/11/2014 07/05/2014 08/19/2012 05/07/2012  Falls in the past year? Yes Yes Yes Yes -  Number falls in past yr: 2 or more 2 or more - 1 -  Injury with Fall? Yes - - - -  Risk for fall due to : - - Impaired balance/gait Impaired balance/gait Impaired balance/gait   MMSE - Mini Mental State Exam 02/05/2015  Orientation to time 0  Orientation to Place 4  Registration 3  Attention/ Calculation 5  Recall 0  Language- name 2 objects 2  Language- repeat 1  Language- follow 3 step command 3  Language- read & follow direction 1  Write a sentence 1  Copy design 1  Total score 21    Health Maintenance  Topic Date Due  . TETANUS/TDAP  04/20/2015  . INFLUENZA VACCINE   04/19/2016  . COLONOSCOPY  01/14/2019  . ZOSTAVAX  Addressed  . PNA vac Low Risk Adult  Completed   Urinary incontinence?  Has some episodes of incontinence here and there and sometimes has trouble starting his stream sometimes.  He has a handheld urinal by the bed, but unclear if he is using it.  Also has depends in his room.  There is a care plan mtg tomorrow.    Functional status?  Learning to self-transfer, feeds himself in his room (goes to dining room if he has company that requires him to go), the staff help in am, afternoon and evening to help with his bathing, dressing, grooming  Exercise?  Trainer comes three times per week for a workout in his room or in the hallway.  Also has some in bed exercises.   Diet?  Does not follow a special diet.  [facility] diet.   Vision:  Wears glasses all of the time.  Mostly they are to read. Hearing:  Hears well.  No concerns from his wife.  He says he does what he's told, but she says he does not go to the dining room.  Dentition:  No difficulty with chewing or swallowing.  Went here to the dentist.    Review of Systems:  Review of Systems  Constitutional: Negative for fever, chills, weight loss and malaise/fatigue.  HENT: Negative for congestion and hearing loss.        Increased cerumen  Eyes: Negative for blurred vision.       Glasses  Respiratory: Negative for cough and shortness of breath.   Cardiovascular: Negative for chest pain, palpitations and leg swelling.  Gastrointestinal: Negative for abdominal pain, constipation, blood in stool and melena.  Genitourinary: Negative for dysuria.       Occasional incontinence and difficulty starting stream at times, has bedside urinal and depends are in room, but he's not wearing them today  Musculoskeletal: Positive for falls. Negative for myalgias.       One fall  Skin: Negative for itching and rash.  Neurological: Positive for tingling, sensory change and focal weakness. Negative for  dizziness, speech change, seizures, loss of consciousness and weakness.  Psychiatric/Behavioral: Positive for memory loss. Negative for depression.    Past Medical History  Diagnosis Date  . Multiple sclerosis ([place]) 2008  . BPH (benign prostatic hyperplasia)   . Prostatitis, chronic   . Allergy   . GERD (gastroesophageal reflux disease)   . COPD (chronic obstructive pulmonary disease) ([place])   . Adenomatous polyp   . Hyperlipidemia 07/31/2014  . Generalized weakness 01/12/2013  . 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 Elevated PSA 07/31/2014  . Foot drop, right 2008  . Dementia 2010    Past Surgical History  Procedure Laterality Date  . Hernia repair  [id number]    3 inguinal hernia repairs on left  . Hernia repair  06/16/11    right inguinal hernia repair with mesh   . Prostate surgery  09/29/2008    Dr. Karsten Ro   . Esophagogastroduodenoscopy endoscopy  04/16/2002    Dr. Earle Gell  . Cataract extraction Left 02/06/12  . Cataract extraction Right 2013    Allergies  Allergen Reactions  . Aricept [Donepezil Hcl] Other (See Comments)    Per [facility]      Medication List       This list is accurate as of: 08/19/15  3:22 PM.  Always use your most recent med list.               aspirin 81 MG tablet  Take 81 mg by mouth daily.     Biotin 5 MG Tabs  Take 1 tablet by mouth daily.     Ciclopirox 0.77 % gel  Apply topically 2 (two) times daily. To face     docusate sodium 100 MG capsule  Commonly known as:  COLACE  Take 100 mg by mouth daily.     ketoconazole 2 % shampoo  Commonly known as:  NIZORAL  Apply 1 application topically 2 (two) times a week.     magnesium hydroxide 400 MG/5ML suspension  Commonly known as:  MILK OF MAGNESIA  Take 15 mLs by mouth daily as needed for mild constipation.     memantine 10 MG tablet  Commonly known as:  NAMENDA  Take 10 mg by mouth 2 (two) times daily.     naproxen sodium 220 MG tablet  Commonly known as:  ANAPROX  Take 220 mg by  mouth every 8 (eight) hours as needed (discomfort).     sertraline 50 MG tablet  Commonly known as:  ZOLOFT  Take 75 mg by mouth daily.  silodosin 8 MG Caps capsule  Commonly known as:  RAPAFLO  Take 8 mg by mouth daily with breakfast.     VITAMIN D PO  Take 2,000 mg by mouth daily.     zolpidem 5 MG tablet  Commonly known as:  AMBIEN  Take 5 mg by mouth at bedtime as needed for sleep.        Physical Exam: Filed Vitals:   08/19/15 1500  BP: 108/62  Pulse: 76  Temp: 97.2 F (36.2 C)  TempSrc: Oral  Height: 6' (1.829 m)  Weight: 187 lb (84.823 kg)  SpO2: 97%   Body mass index is 25.36 kg/(m^2). Physical Exam  Constitutional: He appears well-developed and well-nourished. No distress.  HENT:  Head: Normocephalic and atraumatic.  Right Ear: External ear normal.  Left Ear: External ear normal.  Nose: Nose normal.  Mouth/Throat: Oropharynx is clear and moist. No oropharyngeal exudate.  Increased cerumen present  Eyes: Conjunctivae and EOM are normal. Pupils are equal, round, and reactive to light.  glasses  Neck: Normal range of motion. Neck supple. No JVD present. No thyromegaly present.  Cardiovascular: Normal rate, regular rhythm and normal heart sounds.   Pulmonary/Chest: Effort normal and breath sounds normal.  Abdominal: Soft. Bowel sounds are normal.  Musculoskeletal:  3/5 right LE, 4/5 left LE, unable to obtain patellar DTRs (positional though), also has right foot drop and wears brace  Lymphadenopathy:    He has no cervical adenopathy.  Neurological: He is alert.  Oriented to person; does not seem to realize we are still at [facility] when in clinic (keeps referring to [facility] as another place where he lives)  Skin: Skin is warm and dry.  Psychiatric: He has a normal mood and affect. His behavior is normal.  Is unaware of his mental limitations--wife assists with history    Labs reviewed: Basic Metabolic Panel:  Recent Labs  09/09/14  NA 140  K 3.6    BUN 14  CREATININE 0.8   Liver Function Tests: No results for input(s): AST, ALT, ALKPHOS, BILITOT, PROT, ALBUMIN in the last 8760 hours. No results for input(s): LIPASE, AMYLASE in the last 8760 hours. No results for input(s): AMMONIA in the last 8760 hours. CBC: No results for input(s): WBC, NEUTROABS, HGB, HCT, MCV, PLT in the last 8760 hours. Lipid Panel: No results for input(s): CHOL, HDL, LDLCALC, TRIG, CHOLHDL, LDLDIRECT in the last 8760 hours. Lab Results  Component Value Date   HGBA1C 5.7* 01/13/2013    Assessment/Plan 1. Medicare annual wellness visit, subsequent -see hpi above, he is up to date on all vaccinations except due to for tdap booster which would be done at pharmacy, screenings done today and labs ordered b/c he had none recently including flp  2. Multiple sclerosis ([place]) -primary progressive MS--seems he's been stable over the past 6 mos  -mmse was 21/30 in May and was not repeated--recheck next May unless staff have concerns for further cognitive and functional decline -not on meds for this at this point -has been on avonex -spirits have been good with the zoloft  3. Neurogenic bladder -due to MS--has some incontinence, but is sometimes able to use urinal or get to restroom in time  4. BPH (benign prostatic hyperplasia) -some difficulty starting stream at times, continues on rapaflo with great benefit, he reports  5. 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 Gait disorder -uses  wheelchair to get around and has new one with automatic braking that he's learning to use with OT -uses some naproxen for rare pains  7. Right foot drop -due to MS -cont use of brace  8. Hyperlipidemia -f/u flp  Labs/tests ordered:  Cbc, cmp, flp next draw Next appt:  6 mos med mgt Amaad Byers L. Aleyna Cueva, D.O. [facility] [address], [place] [address] Cell Phone (Mon-Fri 8am-5pm):  [phone] On Call:  [phone] & follow prompts after 5pm & weekends Office Phone:  [phone] Office Fax:  [phone]

## 2015-08-19 NOTE — Progress Notes (Signed)
Patient ID: [name], male   DOB: [birth year], 74 y.o.   MRN: [id number] 02/05/15 MMSE 21/30 failed clock drawing

## 2015-08-20 DIAGNOSIS — E785 Hyperlipidemia, unspecified: Secondary | ICD-10-CM | POA: Diagnosis not present

## 2015-08-20 DIAGNOSIS — R278 Other lack of coordination: Secondary | ICD-10-CM | POA: Diagnosis not present

## 2015-08-20 DIAGNOSIS — G35 Multiple sclerosis: Secondary | ICD-10-CM | POA: Diagnosis not present

## 2015-08-20 DIAGNOSIS — J449 Chronic obstructive pulmonary disease, unspecified: Secondary | ICD-10-CM | POA: Diagnosis not present

## 2015-08-20 DIAGNOSIS — M6281 Muscle weakness (generalized): Secondary | ICD-10-CM | POA: Diagnosis not present

## 2015-08-20 DIAGNOSIS — G3281 Cerebellar ataxia in diseases classified elsewhere: Secondary | ICD-10-CM | POA: Diagnosis not present

## 2015-08-20 DIAGNOSIS — R413 Other amnesia: Secondary | ICD-10-CM | POA: Diagnosis not present

## 2015-08-20 DIAGNOSIS — F0390 Unspecified dementia without behavioral disturbance: Secondary | ICD-10-CM | POA: Diagnosis not present

## 2015-08-20 DIAGNOSIS — G8191 Hemiplegia, unspecified affecting right dominant side: Secondary | ICD-10-CM | POA: Diagnosis not present

## 2015-08-20 LAB — LIPID PANEL
Cholesterol: 207 mg/dL — AB (ref 0–200)
HDL: 50 mg/dL (ref 35–70)
LDL Cholesterol: 135 mg/dL
Triglycerides: 111 mg/dL (ref 40–160)

## 2015-08-20 LAB — CBC AND DIFFERENTIAL
HCT: 44 % (ref 41–53)
Hemoglobin: 15 g/dL (ref 13.5–17.5)
Platelets: 149 10*3/uL — AB (ref 150–399)
WBC: 6 10^3/mL

## 2015-08-20 LAB — BASIC METABOLIC PANEL
BUN: 22 mg/dL — AB (ref 4–21)
Creatinine: 0.8 mg/dL (ref 0.6–1.3)
Glucose: 78 mg/dL
Potassium: 4.2 mmol/L (ref 3.4–5.3)
Sodium: 137 mmol/L (ref 137–147)

## 2015-08-20 LAB — HEPATIC FUNCTION PANEL
ALT: 10 U/L (ref 10–40)
AST: 17 U/L (ref 14–40)
Alkaline Phosphatase: 56 U/L (ref 25–125)
Bilirubin, Total: 0.5 mg/dL

## 2015-08-21 DIAGNOSIS — R278 Other lack of coordination: Secondary | ICD-10-CM | POA: Diagnosis not present

## 2015-08-21 DIAGNOSIS — M6281 Muscle weakness (generalized): Secondary | ICD-10-CM | POA: Diagnosis not present

## 2015-08-21 DIAGNOSIS — R413 Other amnesia: Secondary | ICD-10-CM | POA: Diagnosis not present

## 2015-08-21 DIAGNOSIS — G3281 Cerebellar ataxia in diseases classified elsewhere: Secondary | ICD-10-CM | POA: Diagnosis not present

## 2015-08-21 DIAGNOSIS — G8191 Hemiplegia, unspecified affecting right dominant side: Secondary | ICD-10-CM | POA: Diagnosis not present

## 2015-08-24 DIAGNOSIS — M6281 Muscle weakness (generalized): Secondary | ICD-10-CM | POA: Diagnosis not present

## 2015-08-24 DIAGNOSIS — R278 Other lack of coordination: Secondary | ICD-10-CM | POA: Diagnosis not present

## 2015-08-24 DIAGNOSIS — G8191 Hemiplegia, unspecified affecting right dominant side: Secondary | ICD-10-CM | POA: Diagnosis not present

## 2015-08-24 DIAGNOSIS — G3281 Cerebellar ataxia in diseases classified elsewhere: Secondary | ICD-10-CM | POA: Diagnosis not present

## 2015-08-24 DIAGNOSIS — R413 Other amnesia: Secondary | ICD-10-CM | POA: Diagnosis not present

## 2015-08-25 DIAGNOSIS — M6281 Muscle weakness (generalized): Secondary | ICD-10-CM | POA: Diagnosis not present

## 2015-08-25 DIAGNOSIS — R413 Other amnesia: Secondary | ICD-10-CM | POA: Diagnosis not present

## 2015-08-25 DIAGNOSIS — G8191 Hemiplegia, unspecified affecting right dominant side: Secondary | ICD-10-CM | POA: Diagnosis not present

## 2015-08-25 DIAGNOSIS — R278 Other lack of coordination: Secondary | ICD-10-CM | POA: Diagnosis not present

## 2015-08-25 DIAGNOSIS — G3281 Cerebellar ataxia in diseases classified elsewhere: Secondary | ICD-10-CM | POA: Diagnosis not present

## 2015-08-26 DIAGNOSIS — M6281 Muscle weakness (generalized): Secondary | ICD-10-CM | POA: Diagnosis not present

## 2015-08-26 DIAGNOSIS — R278 Other lack of coordination: Secondary | ICD-10-CM | POA: Diagnosis not present

## 2015-08-26 DIAGNOSIS — R413 Other amnesia: Secondary | ICD-10-CM | POA: Diagnosis not present

## 2015-08-26 DIAGNOSIS — G8191 Hemiplegia, unspecified affecting right dominant side: Secondary | ICD-10-CM | POA: Diagnosis not present

## 2015-08-26 DIAGNOSIS — G3281 Cerebellar ataxia in diseases classified elsewhere: Secondary | ICD-10-CM | POA: Diagnosis not present

## 2015-08-27 DIAGNOSIS — M6281 Muscle weakness (generalized): Secondary | ICD-10-CM | POA: Diagnosis not present

## 2015-08-27 DIAGNOSIS — R413 Other amnesia: Secondary | ICD-10-CM | POA: Diagnosis not present

## 2015-08-27 DIAGNOSIS — R278 Other lack of coordination: Secondary | ICD-10-CM | POA: Diagnosis not present

## 2015-08-27 DIAGNOSIS — G8191 Hemiplegia, unspecified affecting right dominant side: Secondary | ICD-10-CM | POA: Diagnosis not present

## 2015-08-27 DIAGNOSIS — G3281 Cerebellar ataxia in diseases classified elsewhere: Secondary | ICD-10-CM | POA: Diagnosis not present

## 2015-08-28 DIAGNOSIS — R413 Other amnesia: Secondary | ICD-10-CM | POA: Diagnosis not present

## 2015-08-28 DIAGNOSIS — G3281 Cerebellar ataxia in diseases classified elsewhere: Secondary | ICD-10-CM | POA: Diagnosis not present

## 2015-08-28 DIAGNOSIS — G8191 Hemiplegia, unspecified affecting right dominant side: Secondary | ICD-10-CM | POA: Diagnosis not present

## 2015-08-28 DIAGNOSIS — M6281 Muscle weakness (generalized): Secondary | ICD-10-CM | POA: Diagnosis not present

## 2015-08-28 DIAGNOSIS — R278 Other lack of coordination: Secondary | ICD-10-CM | POA: Diagnosis not present

## 2015-08-31 DIAGNOSIS — G3281 Cerebellar ataxia in diseases classified elsewhere: Secondary | ICD-10-CM | POA: Diagnosis not present

## 2015-08-31 DIAGNOSIS — M6281 Muscle weakness (generalized): Secondary | ICD-10-CM | POA: Diagnosis not present

## 2015-08-31 DIAGNOSIS — G8191 Hemiplegia, unspecified affecting right dominant side: Secondary | ICD-10-CM | POA: Diagnosis not present

## 2015-08-31 DIAGNOSIS — R278 Other lack of coordination: Secondary | ICD-10-CM | POA: Diagnosis not present

## 2015-08-31 DIAGNOSIS — R413 Other amnesia: Secondary | ICD-10-CM | POA: Diagnosis not present

## 2015-09-01 DIAGNOSIS — G3281 Cerebellar ataxia in diseases classified elsewhere: Secondary | ICD-10-CM | POA: Diagnosis not present

## 2015-09-01 DIAGNOSIS — R278 Other lack of coordination: Secondary | ICD-10-CM | POA: Diagnosis not present

## 2015-09-01 DIAGNOSIS — R413 Other amnesia: Secondary | ICD-10-CM | POA: Diagnosis not present

## 2015-09-01 DIAGNOSIS — M6281 Muscle weakness (generalized): Secondary | ICD-10-CM | POA: Diagnosis not present

## 2015-09-01 DIAGNOSIS — G8191 Hemiplegia, unspecified affecting right dominant side: Secondary | ICD-10-CM | POA: Diagnosis not present

## 2015-09-02 DIAGNOSIS — R413 Other amnesia: Secondary | ICD-10-CM | POA: Diagnosis not present

## 2015-09-02 DIAGNOSIS — G3281 Cerebellar ataxia in diseases classified elsewhere: Secondary | ICD-10-CM | POA: Diagnosis not present

## 2015-09-02 DIAGNOSIS — R278 Other lack of coordination: Secondary | ICD-10-CM | POA: Diagnosis not present

## 2015-09-02 DIAGNOSIS — G8191 Hemiplegia, unspecified affecting right dominant side: Secondary | ICD-10-CM | POA: Diagnosis not present

## 2015-09-02 DIAGNOSIS — M6281 Muscle weakness (generalized): Secondary | ICD-10-CM | POA: Diagnosis not present

## 2015-09-03 DIAGNOSIS — R413 Other amnesia: Secondary | ICD-10-CM | POA: Diagnosis not present

## 2015-09-03 DIAGNOSIS — R278 Other lack of coordination: Secondary | ICD-10-CM | POA: Diagnosis not present

## 2015-09-03 DIAGNOSIS — G8191 Hemiplegia, unspecified affecting right dominant side: Secondary | ICD-10-CM | POA: Diagnosis not present

## 2015-09-03 DIAGNOSIS — G3281 Cerebellar ataxia in diseases classified elsewhere: Secondary | ICD-10-CM | POA: Diagnosis not present

## 2015-09-03 DIAGNOSIS — M6281 Muscle weakness (generalized): Secondary | ICD-10-CM | POA: Diagnosis not present

## 2015-09-04 DIAGNOSIS — R413 Other amnesia: Secondary | ICD-10-CM | POA: Diagnosis not present

## 2015-09-04 DIAGNOSIS — G3281 Cerebellar ataxia in diseases classified elsewhere: Secondary | ICD-10-CM | POA: Diagnosis not present

## 2015-09-04 DIAGNOSIS — G8191 Hemiplegia, unspecified affecting right dominant side: Secondary | ICD-10-CM | POA: Diagnosis not present

## 2015-09-04 DIAGNOSIS — R278 Other lack of coordination: Secondary | ICD-10-CM | POA: Diagnosis not present

## 2015-09-04 DIAGNOSIS — M6281 Muscle weakness (generalized): Secondary | ICD-10-CM | POA: Diagnosis not present

## 2015-09-07 DIAGNOSIS — R413 Other amnesia: Secondary | ICD-10-CM | POA: Diagnosis not present

## 2015-09-07 DIAGNOSIS — G3281 Cerebellar ataxia in diseases classified elsewhere: Secondary | ICD-10-CM | POA: Diagnosis not present

## 2015-09-07 DIAGNOSIS — M6281 Muscle weakness (generalized): Secondary | ICD-10-CM | POA: Diagnosis not present

## 2015-09-07 DIAGNOSIS — R278 Other lack of coordination: Secondary | ICD-10-CM | POA: Diagnosis not present

## 2015-09-07 DIAGNOSIS — G8191 Hemiplegia, unspecified affecting right dominant side: Secondary | ICD-10-CM | POA: Diagnosis not present

## 2015-09-08 DIAGNOSIS — G3281 Cerebellar ataxia in diseases classified elsewhere: Secondary | ICD-10-CM | POA: Diagnosis not present

## 2015-09-08 DIAGNOSIS — M6281 Muscle weakness (generalized): Secondary | ICD-10-CM | POA: Diagnosis not present

## 2015-09-08 DIAGNOSIS — R278 Other lack of coordination: Secondary | ICD-10-CM | POA: Diagnosis not present

## 2015-09-08 DIAGNOSIS — G8191 Hemiplegia, unspecified affecting right dominant side: Secondary | ICD-10-CM | POA: Diagnosis not present

## 2015-09-08 DIAGNOSIS — R413 Other amnesia: Secondary | ICD-10-CM | POA: Diagnosis not present

## 2015-09-09 DIAGNOSIS — G8191 Hemiplegia, unspecified affecting right dominant side: Secondary | ICD-10-CM | POA: Diagnosis not present

## 2015-09-09 DIAGNOSIS — R413 Other amnesia: Secondary | ICD-10-CM | POA: Diagnosis not present

## 2015-09-09 DIAGNOSIS — R278 Other lack of coordination: Secondary | ICD-10-CM | POA: Diagnosis not present

## 2015-09-09 DIAGNOSIS — G3281 Cerebellar ataxia in diseases classified elsewhere: Secondary | ICD-10-CM | POA: Diagnosis not present

## 2015-09-09 DIAGNOSIS — M6281 Muscle weakness (generalized): Secondary | ICD-10-CM | POA: Diagnosis not present

## 2015-09-10 DIAGNOSIS — G8191 Hemiplegia, unspecified affecting right dominant side: Secondary | ICD-10-CM | POA: Diagnosis not present

## 2015-09-10 DIAGNOSIS — M6281 Muscle weakness (generalized): Secondary | ICD-10-CM | POA: Diagnosis not present

## 2015-09-10 DIAGNOSIS — R413 Other amnesia: Secondary | ICD-10-CM | POA: Diagnosis not present

## 2015-09-10 DIAGNOSIS — R278 Other lack of coordination: Secondary | ICD-10-CM | POA: Diagnosis not present

## 2015-09-10 DIAGNOSIS — G3281 Cerebellar ataxia in diseases classified elsewhere: Secondary | ICD-10-CM | POA: Diagnosis not present

## 2015-09-11 DIAGNOSIS — R278 Other lack of coordination: Secondary | ICD-10-CM | POA: Diagnosis not present

## 2015-09-11 DIAGNOSIS — R413 Other amnesia: Secondary | ICD-10-CM | POA: Diagnosis not present

## 2015-09-11 DIAGNOSIS — M6281 Muscle weakness (generalized): Secondary | ICD-10-CM | POA: Diagnosis not present

## 2015-09-11 DIAGNOSIS — G8191 Hemiplegia, unspecified affecting right dominant side: Secondary | ICD-10-CM | POA: Diagnosis not present

## 2015-09-11 DIAGNOSIS — G3281 Cerebellar ataxia in diseases classified elsewhere: Secondary | ICD-10-CM | POA: Diagnosis not present

## 2015-09-14 DIAGNOSIS — R278 Other lack of coordination: Secondary | ICD-10-CM | POA: Diagnosis not present

## 2015-09-14 DIAGNOSIS — M6281 Muscle weakness (generalized): Secondary | ICD-10-CM | POA: Diagnosis not present

## 2015-09-14 DIAGNOSIS — R413 Other amnesia: Secondary | ICD-10-CM | POA: Diagnosis not present

## 2015-09-14 DIAGNOSIS — G8191 Hemiplegia, unspecified affecting right dominant side: Secondary | ICD-10-CM | POA: Diagnosis not present

## 2015-09-14 DIAGNOSIS — G3281 Cerebellar ataxia in diseases classified elsewhere: Secondary | ICD-10-CM | POA: Diagnosis not present

## 2015-09-15 DIAGNOSIS — M6281 Muscle weakness (generalized): Secondary | ICD-10-CM | POA: Diagnosis not present

## 2015-09-15 DIAGNOSIS — R413 Other amnesia: Secondary | ICD-10-CM | POA: Diagnosis not present

## 2015-09-15 DIAGNOSIS — R278 Other lack of coordination: Secondary | ICD-10-CM | POA: Diagnosis not present

## 2015-09-15 DIAGNOSIS — G8191 Hemiplegia, unspecified affecting right dominant side: Secondary | ICD-10-CM | POA: Diagnosis not present

## 2015-09-15 DIAGNOSIS — G3281 Cerebellar ataxia in diseases classified elsewhere: Secondary | ICD-10-CM | POA: Diagnosis not present

## 2015-09-16 DIAGNOSIS — G8191 Hemiplegia, unspecified affecting right dominant side: Secondary | ICD-10-CM | POA: Diagnosis not present

## 2015-09-16 DIAGNOSIS — M6281 Muscle weakness (generalized): Secondary | ICD-10-CM | POA: Diagnosis not present

## 2015-09-16 DIAGNOSIS — G3281 Cerebellar ataxia in diseases classified elsewhere: Secondary | ICD-10-CM | POA: Diagnosis not present

## 2015-09-16 DIAGNOSIS — R278 Other lack of coordination: Secondary | ICD-10-CM | POA: Diagnosis not present

## 2015-09-16 DIAGNOSIS — R413 Other amnesia: Secondary | ICD-10-CM | POA: Diagnosis not present

## 2015-09-17 DIAGNOSIS — R413 Other amnesia: Secondary | ICD-10-CM | POA: Diagnosis not present

## 2015-09-17 DIAGNOSIS — R278 Other lack of coordination: Secondary | ICD-10-CM | POA: Diagnosis not present

## 2015-09-17 DIAGNOSIS — M6281 Muscle weakness (generalized): Secondary | ICD-10-CM | POA: Diagnosis not present

## 2015-09-17 DIAGNOSIS — G3281 Cerebellar ataxia in diseases classified elsewhere: Secondary | ICD-10-CM | POA: Diagnosis not present

## 2015-09-17 DIAGNOSIS — G8191 Hemiplegia, unspecified affecting right dominant side: Secondary | ICD-10-CM | POA: Diagnosis not present

## 2015-09-18 DIAGNOSIS — R413 Other amnesia: Secondary | ICD-10-CM | POA: Diagnosis not present

## 2015-09-18 DIAGNOSIS — M6281 Muscle weakness (generalized): Secondary | ICD-10-CM | POA: Diagnosis not present

## 2015-09-18 DIAGNOSIS — G8191 Hemiplegia, unspecified affecting right dominant side: Secondary | ICD-10-CM | POA: Diagnosis not present

## 2015-09-18 DIAGNOSIS — G3281 Cerebellar ataxia in diseases classified elsewhere: Secondary | ICD-10-CM | POA: Diagnosis not present

## 2015-09-18 DIAGNOSIS — R278 Other lack of coordination: Secondary | ICD-10-CM | POA: Diagnosis not present

## 2015-09-21 DIAGNOSIS — G3281 Cerebellar ataxia in diseases classified elsewhere: Secondary | ICD-10-CM | POA: Diagnosis not present

## 2015-09-21 DIAGNOSIS — G8191 Hemiplegia, unspecified affecting right dominant side: Secondary | ICD-10-CM | POA: Diagnosis not present

## 2015-09-21 DIAGNOSIS — R413 Other amnesia: Secondary | ICD-10-CM | POA: Diagnosis not present

## 2015-09-21 DIAGNOSIS — M6281 Muscle weakness (generalized): Secondary | ICD-10-CM | POA: Diagnosis not present

## 2015-09-21 DIAGNOSIS — R278 Other lack of coordination: Secondary | ICD-10-CM | POA: Diagnosis not present

## 2015-09-22 DIAGNOSIS — R278 Other lack of coordination: Secondary | ICD-10-CM | POA: Diagnosis not present

## 2015-09-22 DIAGNOSIS — G8191 Hemiplegia, unspecified affecting right dominant side: Secondary | ICD-10-CM | POA: Diagnosis not present

## 2015-09-22 DIAGNOSIS — M6281 Muscle weakness (generalized): Secondary | ICD-10-CM | POA: Diagnosis not present

## 2015-09-22 DIAGNOSIS — G3281 Cerebellar ataxia in diseases classified elsewhere: Secondary | ICD-10-CM | POA: Diagnosis not present

## 2015-09-22 DIAGNOSIS — R413 Other amnesia: Secondary | ICD-10-CM | POA: Diagnosis not present

## 2015-09-23 DIAGNOSIS — R278 Other lack of coordination: Secondary | ICD-10-CM | POA: Diagnosis not present

## 2015-09-23 DIAGNOSIS — G3281 Cerebellar ataxia in diseases classified elsewhere: Secondary | ICD-10-CM | POA: Diagnosis not present

## 2015-09-23 DIAGNOSIS — M6281 Muscle weakness (generalized): Secondary | ICD-10-CM | POA: Diagnosis not present

## 2015-09-23 DIAGNOSIS — G8191 Hemiplegia, unspecified affecting right dominant side: Secondary | ICD-10-CM | POA: Diagnosis not present

## 2015-09-23 DIAGNOSIS — R413 Other amnesia: Secondary | ICD-10-CM | POA: Diagnosis not present

## 2015-09-24 DIAGNOSIS — R413 Other amnesia: Secondary | ICD-10-CM | POA: Diagnosis not present

## 2015-09-24 DIAGNOSIS — M6281 Muscle weakness (generalized): Secondary | ICD-10-CM | POA: Diagnosis not present

## 2015-09-24 DIAGNOSIS — G3281 Cerebellar ataxia in diseases classified elsewhere: Secondary | ICD-10-CM | POA: Diagnosis not present

## 2015-09-24 DIAGNOSIS — R278 Other lack of coordination: Secondary | ICD-10-CM | POA: Diagnosis not present

## 2015-09-24 DIAGNOSIS — G8191 Hemiplegia, unspecified affecting right dominant side: Secondary | ICD-10-CM | POA: Diagnosis not present

## 2015-09-25 DIAGNOSIS — G3281 Cerebellar ataxia in diseases classified elsewhere: Secondary | ICD-10-CM | POA: Diagnosis not present

## 2015-09-25 DIAGNOSIS — R278 Other lack of coordination: Secondary | ICD-10-CM | POA: Diagnosis not present

## 2015-09-25 DIAGNOSIS — G8191 Hemiplegia, unspecified affecting right dominant side: Secondary | ICD-10-CM | POA: Diagnosis not present

## 2015-09-25 DIAGNOSIS — M6281 Muscle weakness (generalized): Secondary | ICD-10-CM | POA: Diagnosis not present

## 2015-09-25 DIAGNOSIS — R413 Other amnesia: Secondary | ICD-10-CM | POA: Diagnosis not present

## 2015-09-28 DIAGNOSIS — R413 Other amnesia: Secondary | ICD-10-CM | POA: Diagnosis not present

## 2015-09-28 DIAGNOSIS — M6281 Muscle weakness (generalized): Secondary | ICD-10-CM | POA: Diagnosis not present

## 2015-09-28 DIAGNOSIS — G8191 Hemiplegia, unspecified affecting right dominant side: Secondary | ICD-10-CM | POA: Diagnosis not present

## 2015-09-28 DIAGNOSIS — G3281 Cerebellar ataxia in diseases classified elsewhere: Secondary | ICD-10-CM | POA: Diagnosis not present

## 2015-09-28 DIAGNOSIS — R278 Other lack of coordination: Secondary | ICD-10-CM | POA: Diagnosis not present

## 2015-10-01 DIAGNOSIS — M6281 Muscle weakness (generalized): Secondary | ICD-10-CM | POA: Diagnosis not present

## 2015-10-01 DIAGNOSIS — G3281 Cerebellar ataxia in diseases classified elsewhere: Secondary | ICD-10-CM | POA: Diagnosis not present

## 2015-10-01 DIAGNOSIS — R413 Other amnesia: Secondary | ICD-10-CM | POA: Diagnosis not present

## 2015-10-01 DIAGNOSIS — G8191 Hemiplegia, unspecified affecting right dominant side: Secondary | ICD-10-CM | POA: Diagnosis not present

## 2015-10-01 DIAGNOSIS — R278 Other lack of coordination: Secondary | ICD-10-CM | POA: Diagnosis not present

## 2015-10-08 DIAGNOSIS — R278 Other lack of coordination: Secondary | ICD-10-CM | POA: Diagnosis not present

## 2015-10-08 DIAGNOSIS — M6281 Muscle weakness (generalized): Secondary | ICD-10-CM | POA: Diagnosis not present

## 2015-10-08 DIAGNOSIS — G3281 Cerebellar ataxia in diseases classified elsewhere: Secondary | ICD-10-CM | POA: Diagnosis not present

## 2015-10-08 DIAGNOSIS — G8191 Hemiplegia, unspecified affecting right dominant side: Secondary | ICD-10-CM | POA: Diagnosis not present

## 2015-10-08 DIAGNOSIS — R413 Other amnesia: Secondary | ICD-10-CM | POA: Diagnosis not present

## 2015-10-14 DIAGNOSIS — M6281 Muscle weakness (generalized): Secondary | ICD-10-CM | POA: Diagnosis not present

## 2015-10-14 DIAGNOSIS — G3281 Cerebellar ataxia in diseases classified elsewhere: Secondary | ICD-10-CM | POA: Diagnosis not present

## 2015-10-14 DIAGNOSIS — R413 Other amnesia: Secondary | ICD-10-CM | POA: Diagnosis not present

## 2015-10-14 DIAGNOSIS — G8191 Hemiplegia, unspecified affecting right dominant side: Secondary | ICD-10-CM | POA: Diagnosis not present

## 2015-10-14 DIAGNOSIS — R278 Other lack of coordination: Secondary | ICD-10-CM | POA: Diagnosis not present

## 2015-10-22 DIAGNOSIS — G8191 Hemiplegia, unspecified affecting right dominant side: Secondary | ICD-10-CM | POA: Diagnosis not present

## 2015-10-22 DIAGNOSIS — R278 Other lack of coordination: Secondary | ICD-10-CM | POA: Diagnosis not present

## 2015-10-22 DIAGNOSIS — R413 Other amnesia: Secondary | ICD-10-CM | POA: Diagnosis not present

## 2015-10-22 DIAGNOSIS — M6281 Muscle weakness (generalized): Secondary | ICD-10-CM | POA: Diagnosis not present

## 2015-10-22 DIAGNOSIS — G3281 Cerebellar ataxia in diseases classified elsewhere: Secondary | ICD-10-CM | POA: Diagnosis not present

## 2016-02-10 ENCOUNTER — Encounter: Payer: Self-pay | Admitting: Internal Medicine

## 2016-02-11 NOTE — Telephone Encounter (Signed)
Called wife Vickii Chafe, she will be in a meeting, but will get there as soon as she can.

## 2016-02-17 ENCOUNTER — Encounter: Payer: Self-pay | Admitting: Internal Medicine

## 2016-02-17 ENCOUNTER — Non-Acute Institutional Stay: Payer: Medicare Other | Admitting: Internal Medicine

## 2016-02-17 VITALS — BP 110/60 | HR 85 | Temp 97.6°F | Ht 72.0 in | Wt 188.0 lb

## 2016-02-17 DIAGNOSIS — N319 Neuromuscular dysfunction of bladder, unspecified: Secondary | ICD-10-CM

## 2016-02-17 DIAGNOSIS — G35 Multiple sclerosis: Secondary | ICD-10-CM

## 2016-02-17 DIAGNOSIS — R413 Other amnesia: Secondary | ICD-10-CM

## 2016-02-17 DIAGNOSIS — E785 Hyperlipidemia, unspecified: Secondary | ICD-10-CM | POA: Diagnosis not present

## 2016-02-17 DIAGNOSIS — N400 Enlarged prostate without lower urinary tract symptoms: Secondary | ICD-10-CM

## 2016-02-17 DIAGNOSIS — R600 Localized edema: Secondary | ICD-10-CM | POA: Diagnosis not present

## 2016-02-17 NOTE — Progress Notes (Signed)
Location:  Occupational psychologist of Service:  Clinic (12)  Provider: Jaleen Finch L. Mariea Clonts, D.O., C.M.D.  Code Status: Full code Goals of Care:  Advanced Directives 02/17/2016  Does patient have an advance directive? Yes  Type of Advance Directive Healthcare Power of Attorney  Copy of advanced directive(s) in chart? Yes   Chief Complaint  Patient presents with  . Medical Management of Chronic Issues    40mth follow-up    HPI: Patient is a 75 y.o. male seen today for medical management of chronic diseases.    Just used the bathroom when she arrived, he used the bathroom here and now he says he has to tee tee again.  Will take his full bottom clothing off to use the restroom.  Does not hydrate well.  His wife comes in and three drinks are still there from breakfast.  She encouraged him to drink today.  Hard for him to redress after each episode of tee tee.    Memory seems to be gradually worsening.  Sometimes forgets that he is staying at [facility].  Using namenda.    Sleeping well, eating well.  Spirits are good.    Says he "can do big jobs" which is having bms.    Only pain he remembers is when he has to "tee tee".  Rarely takes naproxen.    No dizziness with bp on low side.    Right foot swells at times.    No falls since he's back on the walker.    Past Medical History  Diagnosis Date  . Multiple sclerosis ([place]) 2008  . BPH (benign prostatic hyperplasia)   . Prostatitis, chronic   . Allergy   . GERD (gastroesophageal reflux disease)   . COPD (chronic obstructive pulmonary disease) ([place])   . Adenomatous polyp   . Hyperlipidemia 07/31/2014  . Generalized weakness 01/12/2013  . 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 Elevated PSA 07/31/2014  . Foot drop, right 2008  . Dementia 2010    Past Surgical History  Procedure Laterality Date  . Hernia repair  [id number]    3 inguinal hernia repairs on left  . Hernia repair  06/16/11    right inguinal hernia repair  with mesh   . Prostate surgery  09/29/2008    Dr. Karsten Ro   . Esophagogastroduodenoscopy endoscopy  04/16/2002    Dr. Earle Gell  . Cataract extraction Left 02/06/12  . Cataract extraction Right 2013    Allergies  Allergen Reactions  . Aricept [Donepezil Hcl] Other (See Comments)    Per MAR  . Dimethyl Fumarate Nausea And Vomiting      Medication List       This list is accurate as of: 02/17/16  2:27 PM.  Always use your most recent med list.               aspirin 81 MG tablet  Take 81 mg by mouth daily.     Biotin 5 MG Tabs  Take 1 tablet by mouth daily.     ciclopirox 0.77 % cream  Commonly known as:  LOPROX  Apply topically 2 (two) times daily.     docusate sodium 100 MG capsule  Commonly known as:  COLACE  Take 100 mg by mouth daily.     ketoconazole 2 % shampoo  Commonly known as:  NIZORAL  Apply 1 application topically 2 (two) times a week.     magnesium hydroxide 400 MG/5ML suspension  Commonly known as:  MILK OF MAGNESIA  Take 15 mLs by mouth daily as needed for mild constipation.     memantine 10 MG tablet  Commonly known as:  NAMENDA  Take 10 mg by mouth 2 (two) times daily.     naproxen sodium 220 MG tablet  Commonly known as:  ANAPROX  Take 220 mg by mouth every 8 (eight) hours as needed (discomfort).     sertraline 50 MG tablet  Commonly known as:  ZOLOFT  Take 75 mg by mouth daily.     silodosin 8 MG Caps capsule  Commonly known as:  RAPAFLO  Take 8 mg by mouth daily with breakfast.     VITAMIN D PO  Take 2,000 mg by mouth daily.     zolpidem 5 MG tablet  Commonly known as:  AMBIEN  Take 5 mg by mouth at bedtime as needed for sleep.        Review of Systems:  Review of Systems  Constitutional: Negative for fever and chills.  HENT: Negative for congestion.   Respiratory: Negative for cough and shortness of breath.   Cardiovascular: Positive for leg swelling. Negative for chest pain.       Mild of right foot due to his MS    Gastrointestinal: Negative for abdominal pain, constipation, blood in stool and melena.  Genitourinary: Positive for dysuria, urgency and frequency. Negative for hematuria and flank pain.  Musculoskeletal: Negative for falls.       None since back to walker use per pt's wife  Neurological: Positive for focal weakness. Negative for dizziness and loss of consciousness.       Right greater than left from MS  Psychiatric/Behavioral: Positive for memory loss. Negative for depression. The patient is not nervous/anxious and does not have insomnia.     Health Maintenance  Topic Date Due  . TETANUS/TDAP  04/20/2015  . INFLUENZA VACCINE  04/19/2016  . COLONOSCOPY  01/14/2019  . ZOSTAVAX  Addressed  . PNA vac Low Risk Adult  Completed    Physical Exam: Filed Vitals:   02/17/16 1415  BP: 110/60  Pulse: 85  Temp: 97.6 F (36.4 C)  TempSrc: Oral  Height: 6' (1.829 m)  Weight: 188 lb (85.276 kg)  SpO2: 97%   Body mass index is 25.49 kg/(m^2). Physical Exam  Constitutional: He appears well-developed and well-nourished. No distress.  Cardiovascular: Normal rate, regular rhythm, normal heart sounds and intact distal pulses.   Pulmonary/Chest: Effort normal and breath sounds normal. No respiratory distress.  Abdominal: Bowel sounds are normal. He exhibits no distension.  Musculoskeletal:  RLE weakness, has brace  Neurological: He is alert.  Short term memory loss, repeats same things  Skin: Skin is warm and dry.  seborrhea  Psychiatric: He has a normal mood and affect.    Labs reviewed: Basic Metabolic Panel:  Recent Labs  08/20/15 0546  NA 137  K 4.2  BUN 22*  CREATININE 0.8   Liver Function Tests:  Recent Labs  08/20/15 0546  AST 17  ALT 10  ALKPHOS 56   No results for input(s): LIPASE, AMYLASE in the last 8760 hours. No results for input(s): AMMONIA in the last 8760 hours. CBC:  Recent Labs  08/20/15 0546  WBC 6.0  HGB 15.0  HCT 44  PLT 149*   Lipid  Panel:  Recent Labs  08/20/15 0546  CHOL 207*  HDL 50  LDLCALC 135  TRIG 111   Lab Results  Component Value Date   HGBA1C 5.7*  01/13/2013    Assessment/Plan 1. Multiple sclerosis ([place]) -continues to use walker to ambulate--was falling too much when using wheelchair -requires help with bathing, dressing, grooming -really should be in SNF with increasing needs  2. Neurogenic bladder -worsening urges to urinate with little results -also has dysuria due to inadequate fluid intake--has several orders in place about encouraging fluids, but he is still not drinking enough and urine gets concentrated  3. BPH (benign prostatic hyperplasia) -persists, continues on rapaflo  4. Memory loss due to medical condition -seems this is worse functionally and getting more disoriented to place, but he scored one point better on his MMSE 02/08/16 than last year  5. Hyperlipidemia -lipids 5 pt about goal -given goals of care being good quality, does not make sense to treat this  6. Localized edema -mild of RLE which is weak leg from MS where he wears a brace--seems related to the MS to me, nonpitting, only mild and not positional  Labs/tests ordered:  Cbc, cmp, flp before annual Next appt:  6 mos for annual  Myer Bohlman L. Estiven Kohan, D.O. [facility] [address], [place] [address] Cell Phone (Mon-Fri 8am-5pm):  [phone] On Call:  [phone] & follow prompts after 5pm & weekends Office Phone:  [phone] Office Fax:  [phone]

## 2016-02-18 DIAGNOSIS — E784 Other hyperlipidemia: Secondary | ICD-10-CM | POA: Diagnosis not present

## 2016-02-18 DIAGNOSIS — G35 Multiple sclerosis: Secondary | ICD-10-CM | POA: Diagnosis not present

## 2016-03-17 DIAGNOSIS — N390 Urinary tract infection, site not specified: Secondary | ICD-10-CM | POA: Diagnosis not present

## 2016-03-17 DIAGNOSIS — R319 Hematuria, unspecified: Secondary | ICD-10-CM | POA: Diagnosis not present

## 2016-03-17 DIAGNOSIS — Z79899 Other long term (current) drug therapy: Secondary | ICD-10-CM | POA: Diagnosis not present

## 2016-03-17 DIAGNOSIS — R310 Gross hematuria: Secondary | ICD-10-CM | POA: Diagnosis not present

## 2016-05-02 ENCOUNTER — Encounter: Payer: Self-pay | Admitting: *Deleted

## 2016-05-03 DIAGNOSIS — M21371 Foot drop, right foot: Secondary | ICD-10-CM | POA: Diagnosis not present

## 2016-05-03 DIAGNOSIS — N319 Neuromuscular dysfunction of bladder, unspecified: Secondary | ICD-10-CM | POA: Diagnosis not present

## 2016-05-03 DIAGNOSIS — I69851 Hemiplegia and hemiparesis following other cerebrovascular disease affecting right dominant side: Secondary | ICD-10-CM | POA: Diagnosis not present

## 2016-05-03 DIAGNOSIS — R278 Other lack of coordination: Secondary | ICD-10-CM | POA: Diagnosis not present

## 2016-05-03 DIAGNOSIS — G35 Multiple sclerosis: Secondary | ICD-10-CM | POA: Diagnosis not present

## 2016-05-03 DIAGNOSIS — M6380 Disorders of muscle in diseases classified elsewhere, unspecified site: Secondary | ICD-10-CM | POA: Diagnosis not present

## 2016-05-03 DIAGNOSIS — R296 Repeated falls: Secondary | ICD-10-CM | POA: Diagnosis not present

## 2016-05-04 DIAGNOSIS — M6380 Disorders of muscle in diseases classified elsewhere, unspecified site: Secondary | ICD-10-CM | POA: Diagnosis not present

## 2016-05-04 DIAGNOSIS — I69851 Hemiplegia and hemiparesis following other cerebrovascular disease affecting right dominant side: Secondary | ICD-10-CM | POA: Diagnosis not present

## 2016-05-04 DIAGNOSIS — R278 Other lack of coordination: Secondary | ICD-10-CM | POA: Diagnosis not present

## 2016-05-04 DIAGNOSIS — R296 Repeated falls: Secondary | ICD-10-CM | POA: Diagnosis not present

## 2016-05-04 DIAGNOSIS — M21371 Foot drop, right foot: Secondary | ICD-10-CM | POA: Diagnosis not present

## 2016-05-04 DIAGNOSIS — G35 Multiple sclerosis: Secondary | ICD-10-CM | POA: Diagnosis not present

## 2016-05-05 DIAGNOSIS — M21371 Foot drop, right foot: Secondary | ICD-10-CM | POA: Diagnosis not present

## 2016-05-05 DIAGNOSIS — M6380 Disorders of muscle in diseases classified elsewhere, unspecified site: Secondary | ICD-10-CM | POA: Diagnosis not present

## 2016-05-05 DIAGNOSIS — R278 Other lack of coordination: Secondary | ICD-10-CM | POA: Diagnosis not present

## 2016-05-05 DIAGNOSIS — G35 Multiple sclerosis: Secondary | ICD-10-CM | POA: Diagnosis not present

## 2016-05-05 DIAGNOSIS — R296 Repeated falls: Secondary | ICD-10-CM | POA: Diagnosis not present

## 2016-05-05 DIAGNOSIS — I69851 Hemiplegia and hemiparesis following other cerebrovascular disease affecting right dominant side: Secondary | ICD-10-CM | POA: Diagnosis not present

## 2016-05-06 DIAGNOSIS — G35 Multiple sclerosis: Secondary | ICD-10-CM | POA: Diagnosis not present

## 2016-05-06 DIAGNOSIS — R296 Repeated falls: Secondary | ICD-10-CM | POA: Diagnosis not present

## 2016-05-06 DIAGNOSIS — R278 Other lack of coordination: Secondary | ICD-10-CM | POA: Diagnosis not present

## 2016-05-06 DIAGNOSIS — I69851 Hemiplegia and hemiparesis following other cerebrovascular disease affecting right dominant side: Secondary | ICD-10-CM | POA: Diagnosis not present

## 2016-05-06 DIAGNOSIS — M6380 Disorders of muscle in diseases classified elsewhere, unspecified site: Secondary | ICD-10-CM | POA: Diagnosis not present

## 2016-05-06 DIAGNOSIS — M21371 Foot drop, right foot: Secondary | ICD-10-CM | POA: Diagnosis not present

## 2016-05-07 ENCOUNTER — Encounter (HOSPITAL_COMMUNITY): Payer: Self-pay | Admitting: Nurse Practitioner

## 2016-05-07 ENCOUNTER — Emergency Department (HOSPITAL_COMMUNITY): Payer: Medicare Other

## 2016-05-07 ENCOUNTER — Emergency Department (HOSPITAL_COMMUNITY)
Admission: EM | Admit: 2016-05-07 | Discharge: 2016-05-07 | Disposition: A | Discharge status: Home / Self Care | Payer: Medicare Other | Attending: Emergency Medicine | Admitting: Emergency Medicine

## 2016-05-07 DIAGNOSIS — R410 Disorientation, unspecified: Secondary | ICD-10-CM | POA: Diagnosis not present

## 2016-05-07 DIAGNOSIS — R350 Frequency of micturition: Secondary | ICD-10-CM | POA: Diagnosis present

## 2016-05-07 DIAGNOSIS — Y999 Unspecified external cause status: Secondary | ICD-10-CM | POA: Diagnosis not present

## 2016-05-07 DIAGNOSIS — F0391 Unspecified dementia with behavioral disturbance: Secondary | ICD-10-CM | POA: Diagnosis not present

## 2016-05-07 DIAGNOSIS — Y92129 Unspecified place in nursing home as the place of occurrence of the external cause: Secondary | ICD-10-CM | POA: Diagnosis not present

## 2016-05-07 DIAGNOSIS — F29 Unspecified psychosis not due to a substance or known physiological condition: Secondary | ICD-10-CM | POA: Diagnosis not present

## 2016-05-07 DIAGNOSIS — F0390 Unspecified dementia without behavioral disturbance: Secondary | ICD-10-CM | POA: Diagnosis not present

## 2016-05-07 DIAGNOSIS — Y939 Activity, unspecified: Secondary | ICD-10-CM | POA: Insufficient documentation

## 2016-05-07 DIAGNOSIS — N390 Urinary tract infection, site not specified: Secondary | ICD-10-CM | POA: Diagnosis not present

## 2016-05-07 DIAGNOSIS — Z791 Long term (current) use of non-steroidal anti-inflammatories (NSAID): Secondary | ICD-10-CM | POA: Diagnosis not present

## 2016-05-07 DIAGNOSIS — Z79899 Other long term (current) drug therapy: Secondary | ICD-10-CM | POA: Diagnosis not present

## 2016-05-07 DIAGNOSIS — Z87891 Personal history of nicotine dependence: Secondary | ICD-10-CM | POA: Diagnosis not present

## 2016-05-07 DIAGNOSIS — S199XXA Unspecified injury of neck, initial encounter: Secondary | ICD-10-CM | POA: Diagnosis not present

## 2016-05-07 DIAGNOSIS — Z7982 Long term (current) use of aspirin: Secondary | ICD-10-CM | POA: Diagnosis not present

## 2016-05-07 DIAGNOSIS — J449 Chronic obstructive pulmonary disease, unspecified: Secondary | ICD-10-CM | POA: Diagnosis not present

## 2016-05-07 DIAGNOSIS — S0990XA Unspecified injury of head, initial encounter: Secondary | ICD-10-CM | POA: Diagnosis not present

## 2016-05-07 DIAGNOSIS — W19XXXA Unspecified fall, initial encounter: Secondary | ICD-10-CM | POA: Diagnosis not present

## 2016-05-07 DIAGNOSIS — S79911A Unspecified injury of right hip, initial encounter: Secondary | ICD-10-CM | POA: Diagnosis not present

## 2016-05-07 LAB — URINALYSIS, ROUTINE W REFLEX MICROSCOPIC
GLUCOSE, UA: NEGATIVE mg/dL
Hgb urine dipstick: NEGATIVE
KETONES UR: NEGATIVE mg/dL
LEUKOCYTES UA: NEGATIVE
NITRITE: NEGATIVE
PH: 6 (ref 5.0–8.0)
Protein, ur: NEGATIVE mg/dL
SPECIFIC GRAVITY, URINE: 1.021 (ref 1.005–1.030)

## 2016-05-07 NOTE — ED Triage Notes (Signed)
Pt is presented [name] for evaluation of a probable recurrent UTI. He complain of urinary frequency, hesitancy and malodorous urine. Of note, he adds that he was recently treated for UTI and symptoms he is experiencing are typical.

## 2016-05-07 NOTE — ED Notes (Signed)
Bed: [id number] Expected date: 05/07/16 Expected time: 3:42 PM Means of arrival:  Comments: UTI

## 2016-05-07 NOTE — ED Provider Notes (Signed)
[facility] DEPT Provider Note   CSN: [id number] Arrival date & time: 05/07/16  1545     History   Chief Complaint Chief Complaint  Patient presents with  . Urinary Frequency    R/o UTI    HPI [name] is a 75 y.o. male.  HPI  CC: difficulty urinating  Onset/Duration: on going for years. Last time he urinated was last night.  Timing: intermittent Location: urinary system (h/o neurogenic bladder) Quality: difficulty voiding Severity: moderate Modifying Factors:  Improved by: nothing  Worsened by: nothing Associated Signs/Symptoms:  Pertinent (+): abd distension, urgency  Pertinent (-): fevers, change in urine color, no dysuria, frequency. Context: pt with h/o MS, neurogenic bladder, and frequent UTIs presented for difficulty urinating.   Past Medical History:  Diagnosis Date  . Adenomatous polyp   . Allergy   . BPH (benign prostatic hyperplasia)   . COPD (chronic obstructive pulmonary disease) ([place])   . Dementia 2010  . Elevated PSA 07/31/2014  . Foot drop, right 2008  . 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 Generalized weakness 01/12/2013  . GERD (gastroesophageal reflux disease)   . Hyperlipidemia 07/31/2014  . Multiple sclerosis ([place]) 2008  . Prostatitis, chronic     Patient Active Problem List   Diagnosis Date Noted  . Edema 09/08/2014  . Diarrhea 09/08/2014  . Hyperlipidemia 07/31/2014  . Elevated PSA 07/31/2014  . Memory loss due to medical condition 07/05/2014  . Generalized weakness 01/12/2013  . Neurogenic bladder 05/07/2012  . Multiple sclerosis ([place]) 04/20/2011  . BPH (benign prostatic hyperplasia) 04/20/2011  . Adenomatous colon polyp 04/20/2011  . GE reflux 04/20/2011  . Allergic rhinitis 04/20/2011  . 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 CATARACT EXTRACTION Left 02/06/12  . CATARACT EXTRACTION Right 2013  . ESOPHAGOGASTRODUODENOSCOPY ENDOSCOPY  04/16/2002   Dr. Earle Gell  . HERNIA  REPAIR  1980'2000   3 inguinal hernia repairs on left  . HERNIA REPAIR  06/16/11   right inguinal hernia repair with mesh   . PROSTATE SURGERY  09/29/2008   Dr. Karsten Ro        Home Medications    Prior to Admission medications   Medication Sig Start Date End Date Taking? Authorizing Provider  aspirin 81 MG chewable tablet Chew 81 mg by mouth daily.   Yes Historical Provider, MD  Biotin 5 MG TABS Take 5 mg by mouth daily.    Yes Historical Provider, MD  chlorhexidine (PERIDEX) 0.12 % solution Use as directed 15 mLs in the mouth or throat 2 (two) times daily. Pt is to swish and spit.   Yes Historical Provider, MD  cholecalciferol (VITAMIN D) 1000 units tablet Take 2,000 Units by mouth daily.   Yes Historical Provider, MD  ciclopirox (LOPROX) 0.77 % cream Apply 1 application topically 2 (two) times daily. Pt applies to face.   Yes Historical Provider, MD  docusate sodium (COLACE) 100 MG capsule Take 100 mg by mouth at bedtime.    Yes Historical Provider, MD  ketoconazole (NIZORAL) 2 % shampoo Apply 1 application topically 2 (two) times a week. Pt uses on Tuesday and Friday.   Yes Historical Provider, MD  magnesium hydroxide (MILK OF MAGNESIA) 400 MG/5ML suspension Take 15 mLs by mouth daily as needed for mild constipation or moderate constipation.    Yes Historical Provider, MD  memantine (NAMENDA) 10 MG tablet Take 10 mg by mouth 2 (two) times daily.   Yes Historical Provider, MD  naproxen sodium (ANAPROX) 220 MG tablet Take 220 mg by mouth every 8 (eight) hours as needed (for discomfort).    Yes Historical Provider, MD  omega-3 acid ethyl esters (LOVAZA) 1 g capsule Take 1 g by mouth daily.   Yes Historical Provider, MD  saw palmetto 160 MG capsule Take 160 mg by mouth daily.   Yes Historical Provider, MD  sertraline (ZOLOFT) 50 MG tablet Take 75 mg by mouth daily.    Yes Historical Provider, MD  silodosin (RAPAFLO) 8 MG CAPS capsule Take 8 mg by mouth at bedtime.    Yes Historical Provider,  MD  vitamin C (ASCORBIC ACID) 500 MG tablet Take 500 mg by mouth daily.   Yes Historical Provider, MD  zolpidem (AMBIEN) 5 MG tablet Take 5 mg by mouth at bedtime as needed for sleep.   Yes Historical Provider, MD    Family History Family History  Problem Relation Age of Onset  . Depression Mother   . Dementia Father   . Pneumonia Father     Social History Social History  Substance Use Topics  . Smoking status: Former Smoker    Quit date: 09/20/1975  . Smokeless tobacco: Never Used  . Alcohol use No     Comment: Quit: 2003     Allergies   Aricept [donepezil hcl] and Dimethyl fumarate   Review of Systems Review of Systems  Constitutional: Negative for chills, fatigue and fever.  HENT: Negative for congestion.   Eyes: Negative for visual disturbance.  Respiratory: Negative for shortness of breath.   Cardiovascular: Negative for chest pain and leg swelling.  Gastrointestinal: Positive for abdominal distention. Negative for abdominal pain, diarrhea, nausea and vomiting.  Genitourinary: Positive for difficulty urinating and urgency. Negative for dysuria, flank pain and hematuria.  Musculoskeletal: Negative for neck pain.  Skin: Negative for wound.  Neurological: Negative for dizziness, light-headedness and headaches.     Physical Exam Updated Vital Signs BP 115/71 (BP Location: Right Arm)   Pulse 74   Temp 97.9 F (36.6 C) (Oral)   Resp 16   SpO2 95%   Physical Exam  Constitutional: He appears well-developed and well-nourished. No distress.  HENT:  Head: Normocephalic and atraumatic.  Right Ear: External ear normal.  Left Ear: External ear normal.  Nose: Nose normal.  Mouth/Throat: Oropharynx is clear and moist.  Eyes: Conjunctivae and EOM are normal. Pupils are equal, round, and reactive to light. Right eye exhibits no discharge. Left eye exhibits no discharge. No scleral icterus.  Neck: Normal range of motion. Neck supple.  Cardiovascular: Normal rate, regular  rhythm and normal heart sounds.  Exam reveals no gallop and no friction rub.   No murmur heard. Pulses:      Radial pulses are 2+ on the right side, and 2+ on the left side.       Dorsalis pedis pulses are 2+ on the right side, and 2+ on the left side.  Pulmonary/Chest: Effort normal and breath sounds normal. No stridor. No respiratory distress. He has no wheezes.  Abdominal: Soft. He exhibits distension. There is no tenderness.  Musculoskeletal: He exhibits no edema or tenderness.       Cervical back: He exhibits no bony tenderness.       Thoracic back: He exhibits no bony tenderness.       Lumbar back: He exhibits no bony tenderness.  Clavicle stable. Chest stable to AP/Lat compression Pelvis stable to Lat compression No obvious extremity deformity  Neurological: He is alert.  He is disoriented. GCS eye subscore is 4. GCS verbal subscore is 5. GCS motor subscore is 6.  Moving all extremities   Skin: Skin is warm and dry. No rash noted. He is not diaphoretic. No erythema.  Psychiatric: He has a normal mood and affect.     ED Treatments / Results  Labs (all labs ordered are listed, but only abnormal results are displayed) Labs Reviewed  URINALYSIS, ROUTINE W REFLEX MICROSCOPIC (NOT AT [facility]) - Abnormal; Notable for the following:       Result Value   Bilirubin Urine SMALL (*)    All other components within normal limits    EKG  EKG Interpretation None       Radiology Ct Head Wo Contrast  Result Date: 05/07/2016 CLINICAL DATA:  [name] at nursing home.  Dementia. EXAM: CT HEAD WITHOUT CONTRAST CT CERVICAL SPINE WITHOUT CONTRAST TECHNIQUE: Multidetector CT imaging of the head and cervical spine was performed following the standard protocol without intravenous contrast. Multiplanar CT image reconstructions of the cervical spine were also generated. COMPARISON:  05/14/2015 FINDINGS: CT HEAD FINDINGS The brain shows generalized atrophy. There are extensive chronic small vessel  ischemic changes affecting the pons, thalami, basal ganglia and cerebral hemispheric white matter. No large vessel territory infarction. No mass lesion, hemorrhage, hydrocephalus or extra-axial collection. No skull fracture. No fluid in the sinuses, middle ears or mastoids. CT CERVICAL SPINE FINDINGS No fracture or traumatic malalignment. 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 Facet osteoarthritis most pronounced on the right at C3-4 and on the left at C7-T1. IMPRESSION: Head CT: No acute or traumatic finding. Extensive chronic small vessel ischemic changes throughout the brain. CT cervical spine: No acute or traumatic finding. Chronic degenerative changes. Electronically Signed   By: Nelson Chimes M.D.   On: 05/07/2016 18:47   Ct Cervical Spine Wo Contrast  Result Date: 05/07/2016 CLINICAL DATA:  [name] at nursing home.  Dementia. EXAM: CT HEAD WITHOUT CONTRAST CT CERVICAL SPINE WITHOUT CONTRAST TECHNIQUE: Multidetector CT imaging of the head and cervical spine was performed following the standard protocol without intravenous contrast. Multiplanar CT image reconstructions of the cervical spine were also generated. COMPARISON:  05/14/2015 FINDINGS: CT HEAD FINDINGS The brain shows generalized atrophy. There are extensive chronic small vessel ischemic changes affecting the pons, thalami, basal ganglia and cerebral hemispheric white matter. No large vessel territory infarction. No mass lesion, hemorrhage, hydrocephalus or extra-axial collection. No skull fracture. No fluid in the sinuses, middle ears or mastoids. CT CERVICAL SPINE FINDINGS No fracture or traumatic malalignment. 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 Facet osteoarthritis most pronounced on the right at C3-4 and on the left at C7-T1. IMPRESSION: Head CT: No acute or traumatic finding. Extensive chronic small vessel  ischemic changes throughout the brain. CT cervical spine: No acute or traumatic finding. Chronic degenerative changes. Electronically Signed   By: Nelson Chimes M.D.   On: 05/07/2016 18:47   Dg Hip Unilat W Or Wo Pelvis 2-3 Views Right  Result Date: 05/07/2016 CLINICAL DATA:  Status post unwitnessed fall at nursing home, with concern for right hip injury. Initial encounter. EXAM: DG HIP (WITH OR WITHOUT PELVIS) 2-3V RIGHT COMPARISON:  None. FINDINGS: There is no evidence of fracture or dislocation. Both femoral heads are seated normally within their respective acetabula. The proximal right femur appears intact. No significant degenerative change is appreciated. The sacroiliac joints are unremarkable in appearance. The  visualized bowel gas pattern is grossly unremarkable in appearance. IMPRESSION: No evidence of fracture or dislocation. Electronically Signed   By: Garald Balding M.D.   On: 05/07/2016 18:48    Procedures Procedures (including critical care time)  Medications Ordered in ED Medications - No data to display   Initial Impression / Assessment and Plan / ED Course  I have reviewed the triage vital signs and the nursing notes.  Pertinent labs & imaging results that were available during my care of the patient were reviewed by me and considered in my medical decision making (see chart for details).  Clinical Course    I spoke with the wife who reported that the patient at best baseline is demented and severely confused. She did report that he focuses a lot on his ability to urinate. She reported that he lives in a skilled nursing facility but just Moved to a new unit at the clinical section and the staff there is not familiar with him. She did report that the patient did have a mechanical fall earlier this morning which they were not concerned about.  Bladder scan revealed small amount of fluid. In and out cath resulted in 40 mL of urine. No evidence suggesting of obstruction at this  time. Don't feel that her indwelling catheter is necessary at this time.  CT head and cervical spine negative. Plain film of the hip without acute fracture.  Patient is safe for discharge to return back to the skilled nursing facility.  Final Clinical Impressions(s) / ED Diagnoses   Final diagnoses:  Dementia, without behavioral disturbance  Fall, initial encounter   Disposition: Discharge  Condition: Good  I have discussed the results, Dx and Tx plan with the patient's wife who expressed understanding and agree(s) with the plan. Discharge instructions discussed at great length. The patient' wife was given strict return precautions who verbalized understanding of the instructions. No further questions at time of discharge.    Current Discharge Medication List      Follow Up: [name], DO [place]. [place] [address] [phone]  Call  As needed      Fatima Blank, MD 05/07/16 [phone]

## 2016-05-07 NOTE — ED Notes (Addendum)
Pt continues to c/o of not being able to urinate even after I&O catheter. Pt asked to be catheterized and I explained to him that I gave him a catheter about an hour prior and that I also did a bladder scan on him. Pt did not remember getting the catheter or bladder scan. RN made aware

## 2016-05-09 ENCOUNTER — Non-Acute Institutional Stay (SKILLED_NURSING_FACILITY): Payer: Medicare Other | Admitting: Adult Health

## 2016-05-09 ENCOUNTER — Encounter: Payer: Self-pay | Admitting: Adult Health

## 2016-05-09 DIAGNOSIS — R4182 Altered mental status, unspecified: Secondary | ICD-10-CM | POA: Diagnosis not present

## 2016-05-09 DIAGNOSIS — G4700 Insomnia, unspecified: Secondary | ICD-10-CM

## 2016-05-09 DIAGNOSIS — R350 Frequency of micturition: Secondary | ICD-10-CM

## 2016-05-09 DIAGNOSIS — F0391 Unspecified dementia with behavioral disturbance: Secondary | ICD-10-CM | POA: Diagnosis not present

## 2016-05-09 DIAGNOSIS — W19XXXA Unspecified fall, initial encounter: Secondary | ICD-10-CM

## 2016-05-09 LAB — BASIC METABOLIC PANEL
BUN: 18 mg/dL (ref 4–21)
Creatinine: 0.8 mg/dL (ref 0.6–1.3)
Glucose: 104 mg/dL
Potassium: 3.6 mmol/L (ref 3.4–5.3)
Sodium: 140 mmol/L (ref 137–147)

## 2016-05-09 LAB — CBC AND DIFFERENTIAL
HCT: 39 % — AB (ref 41–53)
Hemoglobin: 13.9 g/dL (ref 13.5–17.5)
Platelets: 231 10*3/uL (ref 150–399)
WBC: 5.8 10^3/mL

## 2016-05-09 LAB — HEPATIC FUNCTION PANEL
ALT: 17 U/L (ref 10–40)
AST: 19 U/L (ref 14–40)
Alkaline Phosphatase: 73 U/L (ref 25–125)
Bilirubin, Total: 0.3 mg/dL
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
## 2016-05-10 ENCOUNTER — Non-Acute Institutional Stay (SKILLED_NURSING_FACILITY): Payer: Medicare Other | Admitting: Internal Medicine

## 2016-05-10 ENCOUNTER — Encounter: Payer: Self-pay | Admitting: Internal Medicine

## 2016-05-10 DIAGNOSIS — W19XXXS Unspecified fall, sequela: Secondary | ICD-10-CM | POA: Diagnosis not present

## 2016-05-10 DIAGNOSIS — L219 Seborrheic dermatitis, unspecified: Secondary | ICD-10-CM

## 2016-05-10 DIAGNOSIS — G35 Multiple sclerosis: Secondary | ICD-10-CM

## 2016-05-10 DIAGNOSIS — R350 Frequency of micturition: Secondary | ICD-10-CM

## 2016-05-10 DIAGNOSIS — R278 Other lack of coordination: Secondary | ICD-10-CM | POA: Diagnosis not present

## 2016-05-10 DIAGNOSIS — Z7189 Other specified counseling: Secondary | ICD-10-CM | POA: Diagnosis not present

## 2016-05-10 DIAGNOSIS — R296 Repeated falls: Secondary | ICD-10-CM | POA: Diagnosis not present

## 2016-05-10 DIAGNOSIS — M6380 Disorders of muscle in diseases classified elsewhere, unspecified site: Secondary | ICD-10-CM | POA: Diagnosis not present

## 2016-05-10 DIAGNOSIS — I69851 Hemiplegia and hemiparesis following other cerebrovascular disease affecting right dominant side: Secondary | ICD-10-CM | POA: Diagnosis not present

## 2016-05-10 DIAGNOSIS — N319 Neuromuscular dysfunction of bladder, unspecified: Secondary | ICD-10-CM | POA: Diagnosis not present

## 2016-05-10 DIAGNOSIS — F0391 Unspecified dementia with behavioral disturbance: Secondary | ICD-10-CM | POA: Diagnosis not present

## 2016-05-10 DIAGNOSIS — N400 Enlarged prostate without lower urinary tract symptoms: Secondary | ICD-10-CM | POA: Diagnosis not present

## 2016-05-10 DIAGNOSIS — M21371 Foot drop, right foot: Secondary | ICD-10-CM | POA: Diagnosis not present

## 2016-05-10 NOTE — Progress Notes (Signed)
Patient ID: [name], male   DOB: [birth year], 75 y.o.   MRN: [id number]  Provider:  Rexene Edison. Mariea Clonts, D.O., C.M.D. Location:   [facility] Room Number: 671 IWPYK of Service:  SNF (31)  PCP: Hollace Kinnier, DO Patient Care Team: [name], DO as PCP - General (Geriatric Medicine) [name], MD as Consulting Physician (Neurology) [name], MD as Consulting Physician (Urology) [name], MD as Consulting Physician (General Surgery) [name], MD as Consulting Physician (Orthopedic Surgery) [name], MD as Consulting Physician (General Surgery) [name], MD as Consulting Physician (Gastroenterology) [name], DO as Consulting Physician (Geriatric Medicine)  Extended Emergency Contact Information Primary Emergency Contact: [name] Address: [address]          [place], [place] [address] [name] of [place] Phone: [phone] Mobile Phone: [phone] Relation: Spouse  Code Status: DNR, MOST done today Goals of Care: Advanced Directive information Advanced Directives 05/10/2016  Does patient have an advance directive? -  Type of Paramedic of [place];Living will  Does patient want to make changes to advanced directive? -  Copy of advanced directive(s) in chart? Yes  Would patient like information on creating an advanced directive? -  Pre-existing out of facility DNR order (yellow form or pink MOST form) -   Chief Complaint  Patient presents with  . New Admit To SNF    new admit    HPI: Patient is a 75 y.o. male seen today for [place] [facility]F [facility] [place].  He was admitted on 05/04/16.  He's had some increased behaviors with agitation and combativeness since the move.  His wife notes that he had a similar problem upon transfer to [facility] at [facility].  He gets disoriented and confused.  He wound up getting sent to the ED due to his behavior and c/o urinary frequency.  This has been  longstanding.  He has a neurogenic bladder from his multiple sclerosis and also BPH.  He does urinate frequently.  He historically was fully worked up for his problem and little could be done.  He had a fall in the midst of his behaviors as well, but no significant injuries.  We also met to discuss a "do not hospitalize" order where we would manage the majority of his care here unless he had a broken bone or his symptoms could not be managed here.  He and his wife were in agreement with that.  We also reviewed a MOST form and decided on DNR, comfort measures, IVFs and abx TBD and no feeding tubes.  He was able to actively and appropriately participate in the discussion and his wife was in agreement.     Past Medical History:  Diagnosis Date  . Adenomatous polyp   . Allergy   . BPH (benign prostatic hyperplasia)   . COPD (chronic obstructive pulmonary disease) ([place])   . Dementia 2010  . Elevated PSA 07/31/2014  . Foot drop, right 2008  . 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 Generalized weakness 01/12/2013  . GERD (gastroesophageal reflux disease)   . Hyperlipidemia 07/31/2014  . Multiple sclerosis ([place]) 2008  . Prostatitis, chronic    Past Surgical History:  Procedure Laterality Date  . CATARACT EXTRACTION Left 02/06/12  . CATARACT EXTRACTION Right 2013  . ESOPHAGOGASTRODUODENOSCOPY ENDOSCOPY  04/16/2002   Dr. Earle Gell  . HERNIA REPAIR  1980'2000   3 inguinal hernia repairs on left  . HERNIA REPAIR  06/16/11  right inguinal hernia repair with mesh   . PROSTATE SURGERY  09/29/2008   Dr. Karsten Ro     reports that he quit smoking about 40 years ago. He has never used smokeless tobacco. He reports that he does not drink alcohol or use drugs. Social History   Social History  . Marital status: Married    Spouse name: Vickii Chafe  . Number of children: 0  . Years of education: Law School   Occupational History  . Retired Chief Executive Officer    Social History Main Topics  . Smoking status: Former Smoker      Quit date: 09/20/1975  . Smokeless tobacco: Never Used  . Alcohol use No     Comment: Quit: 2003  . Drug use: No  . Sexual activity: No   Other Topics Concern  . Not on file   Social History Narrative   Pt lives at [facility] and moved in 07/29/2014.   Spouse Peggy    Caffeine: Quit in 2003   Stopped smoking 1977   Exercise none   POA, Living Will       Functional Status Survey:    Family History  Problem Relation Age of Onset  . Depression Mother   . Dementia Father   . Pneumonia Father     Health Maintenance  Topic Date Due  . TETANUS/TDAP  04/20/2015  . INFLUENZA VACCINE  04/19/2016  . COLONOSCOPY  01/14/2019  . ZOSTAVAX  Addressed  . PNA vac Low Risk Adult  Completed    Allergies  Allergen Reactions  . Aricept [Donepezil Hcl] Other (See Comments)    Reaction:  Unknown   . Dimethyl Fumarate Nausea And Vomiting      Medication List       Accurate as of 05/10/16 10:21 AM. Always use your most recent med list.          aspirin 81 MG chewable tablet Chew 81 mg by mouth daily.   Biotin 5 MG Tabs Take 5 mg by mouth daily.   chlorhexidine 0.12 % solution Commonly known as:  PERIDEX Use as directed 15 mLs in the mouth or throat 2 (two) times daily. Pt is to swish and spit.   cholecalciferol 1000 units tablet Commonly known as:  VITAMIN D Take 2,000 Units by mouth daily.   ciclopirox 0.77 % cream Commonly known as:  LOPROX Apply 1 application topically 2 (two) times daily. Pt applies to face.   docusate sodium 100 MG capsule Commonly known as:  COLACE Take 100 mg by mouth at bedtime as needed for mild constipation or moderate constipation.   ketoconazole 2 % shampoo Commonly known as:  NIZORAL Apply 1 application topically 2 (two) times a week. Pt uses on Tuesday and Friday.   loperamide 2 MG capsule Commonly known as:  IMODIUM Take 4 mg by mouth as needed for diarrhea or loose stools (at onset of loose stool and 1 tab (24m) after each  sucessive loose stool).   magnesium hydroxide 400 MG/5ML suspension Commonly known as:  MILK OF MAGNESIA Take 15 mLs by mouth daily as needed for mild constipation or moderate constipation.   memantine 10 MG tablet Commonly known as:  NAMENDA Take 10 mg by mouth 2 (two) times daily.   naproxen sodium 220 MG tablet Commonly known as:  ANAPROX Take 220 mg by mouth every 8 (eight) hours as needed (for discomfort).   sertraline 50 MG tablet Commonly known as:  ZOLOFT Take 75 mg by mouth daily.  silodosin 8 MG Caps capsule Commonly known as:  RAPAFLO Take 8 mg by mouth at bedtime.       Review of Systems  Constitutional: Negative for activity change, appetite change, chills and fever.  HENT: Negative for congestion and hearing loss.   Eyes: Negative for visual disturbance.       Glasses  Respiratory: Negative for chest tightness and shortness of breath.   Cardiovascular: Negative for chest pain and leg swelling.  Gastrointestinal: Negative for abdominal pain, constipation, diarrhea and vomiting.  Endocrine: Negative for cold intolerance and heat intolerance.  Genitourinary: Positive for dysuria and frequency. Negative for urgency.       Chronic neurogenic bladder and retention  Musculoskeletal: Positive for gait problem. Negative for arthralgias.  Skin: Negative for color change.  Neurological: Positive for weakness. Negative for dizziness.  Hematological: Negative for adenopathy. Does not bruise/bleed easily.  Psychiatric/Behavioral: Positive for behavioral problems and confusion. Negative for agitation, hallucinations, sleep disturbance and suicidal ideas. The patient is not nervous/anxious.     Vitals:   05/10/16 1005  BP: 100/64  Pulse: 74  Resp: 19  Temp: 98.1 F (36.7 C)  TempSrc: Oral  SpO2: 96%  Weight: 178 lb (80.7 kg)   Body mass index is 24.14 kg/m. Physical Exam  Constitutional: He appears well-developed and well-nourished.  HENT:  Head:  Normocephalic and atraumatic.  Right Ear: External ear normal.  Left Ear: External ear normal.  Nose: Nose normal.  Mouth/Throat: Oropharynx is clear and moist.  Eyes: Conjunctivae and EOM are normal. Pupils are equal, round, and reactive to light.  glasses  Neck: Normal range of motion. Neck supple. No JVD present. No tracheal deviation present. No thyromegaly present.  Cardiovascular: Normal rate, normal heart sounds and intact distal pulses.   Pulmonary/Chest: Effort normal and breath sounds normal. No respiratory distress.  Abdominal: Soft. Bowel sounds are normal. He exhibits no distension and no mass. There is no tenderness. There is no rebound and no guarding.  Musculoskeletal: Normal range of motion. He exhibits no edema or tenderness.  Lymphadenopathy:    He has no cervical adenopathy.  Neurological: He is alert. No cranial nerve deficit.  Oriented to person, not to place or time (thinks off and on during visit that he is at the hospital); chronic foot drop from MS  Skin: Skin is warm and dry.  Psychiatric: He has a normal mood and affect.  Very pleasant, calm and appropriate during visit    Labs reviewed: Basic Metabolic Panel:  Recent Labs  08/20/15 0546 05/09/16 1102  NA 137 140  K 4.2 3.6  BUN 22* 18  CREATININE 0.8 0.8   Liver Function Tests:  Recent Labs  08/20/15 0546 05/09/16 1102  AST 17 19  ALT 10 17  ALKPHOS 56 73   No results for input(s): LIPASE, AMYLASE in the last 8760 hours. No results for input(s): AMMONIA in the last 8760 hours. CBC:  Recent Labs  08/20/15 0546 05/09/16 1102  WBC 6.0 5.8  HGB 15.0 13.9  HCT 44 39*  PLT 149* 231   Cardiac Enzymes: No results for input(s): CKTOTAL, CKMB, CKMBINDEX, TROPONINI in the last 8760 hours. BNP: Invalid input(s): POCBNP Lab Results  Component Value Date   HGBA1C 5.7 (H) 01/13/2013   Lab Results  Component Value Date   TSH 0.484 01/13/2013   Lab Results  Component Value Date    VITAMINB12 679 01/21/2013   Lab Results  Component Value Date   FOLATE 16.9 01/21/2013  No results found for: IRON, TIBC, FERRITIN  Imaging and Procedures obtained prior to SNF admission: Ct Head Wo Contrast  Result Date: 05/07/2016 CLINICAL DATA:  [name] at nursing home.  Dementia. EXAM: CT HEAD WITHOUT CONTRAST CT CERVICAL SPINE WITHOUT CONTRAST TECHNIQUE: Multidetector CT imaging of the head and cervical spine was performed following the standard protocol without intravenous contrast. Multiplanar CT image reconstructions of the cervical spine were also generated. COMPARISON:  05/14/2015 FINDINGS: CT HEAD FINDINGS The brain shows generalized atrophy. There are extensive chronic small vessel ischemic changes affecting the pons, thalami, basal ganglia and cerebral hemispheric white matter. No large vessel territory infarction. No mass lesion, hemorrhage, hydrocephalus or extra-axial collection. No skull fracture. No fluid in the sinuses, middle ears or mastoids. CT CERVICAL SPINE FINDINGS No fracture or traumatic malalignment. 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 Facet osteoarthritis most pronounced on the right at C3-4 and on the left at C7-T1. IMPRESSION: Head CT: No acute or traumatic finding. Extensive chronic small vessel ischemic changes throughout the brain. CT cervical spine: No acute or traumatic finding. Chronic degenerative changes. Electronically Signed   By: Nelson Chimes M.D.   On: 05/07/2016 18:47   Ct Cervical Spine Wo Contrast  Result Date: 05/07/2016 CLINICAL DATA:  [name] at nursing home.  Dementia. EXAM: CT HEAD WITHOUT CONTRAST CT CERVICAL SPINE WITHOUT CONTRAST TECHNIQUE: Multidetector CT imaging of the head and cervical spine was performed following the standard protocol without intravenous contrast. Multiplanar CT image reconstructions of the cervical spine were also generated. COMPARISON:  05/14/2015 FINDINGS:  CT HEAD FINDINGS The brain shows generalized atrophy. There are extensive chronic small vessel ischemic changes affecting the pons, thalami, basal ganglia and cerebral hemispheric white matter. No large vessel territory infarction. No mass lesion, hemorrhage, hydrocephalus or extra-axial collection. No skull fracture. No fluid in the sinuses, middle ears or mastoids. CT CERVICAL SPINE FINDINGS No fracture or traumatic malalignment. 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 Facet osteoarthritis most pronounced on the right at C3-4 and on the left at C7-T1. IMPRESSION: Head CT: No acute or traumatic finding. Extensive chronic small vessel ischemic changes throughout the brain. CT cervical spine: No acute or traumatic finding. Chronic degenerative changes. Electronically Signed   By: Nelson Chimes M.D.   On: 05/07/2016 18:47   Dg Hip Unilat W Or Wo Pelvis 2-3 Views Right  Result Date: 05/07/2016 CLINICAL DATA:  Status post unwitnessed fall at nursing home, with concern for right hip injury. Initial encounter. EXAM: DG HIP (WITH OR WITHOUT PELVIS) 2-3V RIGHT COMPARISON:  None. FINDINGS: There is no evidence of fracture or dislocation. Both femoral heads are seated normally within their respective acetabula. The proximal right femur appears intact. No significant degenerative change is appreciated. The sacroiliac joints are unremarkable in appearance. The visualized bowel gas pattern is grossly unremarkable in appearance. IMPRESSION: No evidence of fracture or dislocation. Electronically Signed   By: Garald Balding M.D.   On: 05/07/2016 18:48    Assessment/Plan 1. Multiple sclerosis ([place]) -cont use of wheelchair, not on meds for this, has foot drop -is felt to be the cause of his dementia  2. Dementia, with behavioral disturbance -due to #1, cont namenda, did not tolerate aricept -has been progressing in adl needs and has just moved to SNF    -if agitation remains problematic despite regular toileting, may need to add depakote, but I'm trying to avoid adding anymore meds  to his regimen -had increased delirium during prior move [place], so this is not surprising--needs to get onto a regular schedule which should help him--a note was written to remind him where he is now and why  3. Falls, sequela -had a fall when he was agitated and c/o need to urinate--has frequency and needs to use the restroom regularly  4. Neurogenic bladder -due to MS -continues on rapaflo with some improvement -may eventually need I/O caths, but urology had no more ideas when he was last seen (as per his wife) so he's been managed the same way for a couple of years now  61. Urinary frequency -due to #4  6. BPH (benign prostatic hyperplasia) -cont rapaflo  7. Seborrheic dermatitis -continue with his loprox cream for this (it has flared probably due to stress of his move)  8. Counseling regarding advanced care planning and goals of care -see above, about 30 mins were spent completing a MOST form for him at [facility] and he has agreed to do not hospitalize unless he has a fracture or other severe condition where his wife agrees to him going out to the hospital (she must be called prior to any transfers)  Family/ staff Communication: discussed with his wife, SNF nursing, nurse manager  Labs/tests ordered:  No new labs (had full workup at ED which was negative)

## 2016-05-11 DIAGNOSIS — R278 Other lack of coordination: Secondary | ICD-10-CM | POA: Diagnosis not present

## 2016-05-11 DIAGNOSIS — W19XXXA Unspecified fall, initial encounter: Secondary | ICD-10-CM | POA: Insufficient documentation

## 2016-05-11 DIAGNOSIS — F0390 Unspecified dementia without behavioral disturbance: Secondary | ICD-10-CM | POA: Insufficient documentation

## 2016-05-11 DIAGNOSIS — R296 Repeated falls: Secondary | ICD-10-CM | POA: Diagnosis not present

## 2016-05-11 DIAGNOSIS — G35 Multiple sclerosis: Secondary | ICD-10-CM | POA: Diagnosis not present

## 2016-05-11 DIAGNOSIS — I69851 Hemiplegia and hemiparesis following other cerebrovascular disease affecting right dominant side: Secondary | ICD-10-CM | POA: Diagnosis not present

## 2016-05-11 DIAGNOSIS — M6380 Disorders of muscle in diseases classified elsewhere, unspecified site: Secondary | ICD-10-CM | POA: Diagnosis not present

## 2016-05-11 DIAGNOSIS — M21371 Foot drop, right foot: Secondary | ICD-10-CM | POA: Diagnosis not present

## 2016-05-12 DIAGNOSIS — R296 Repeated falls: Secondary | ICD-10-CM | POA: Diagnosis not present

## 2016-05-12 DIAGNOSIS — R278 Other lack of coordination: Secondary | ICD-10-CM | POA: Diagnosis not present

## 2016-05-12 DIAGNOSIS — G35 Multiple sclerosis: Secondary | ICD-10-CM | POA: Diagnosis not present

## 2016-05-12 DIAGNOSIS — M21371 Foot drop, right foot: Secondary | ICD-10-CM | POA: Diagnosis not present

## 2016-05-12 DIAGNOSIS — M6380 Disorders of muscle in diseases classified elsewhere, unspecified site: Secondary | ICD-10-CM | POA: Diagnosis not present

## 2016-05-12 DIAGNOSIS — I69851 Hemiplegia and hemiparesis following other cerebrovascular disease affecting right dominant side: Secondary | ICD-10-CM | POA: Diagnosis not present

## 2016-05-13 DIAGNOSIS — M21371 Foot drop, right foot: Secondary | ICD-10-CM | POA: Diagnosis not present

## 2016-05-13 DIAGNOSIS — I69851 Hemiplegia and hemiparesis following other cerebrovascular disease affecting right dominant side: Secondary | ICD-10-CM | POA: Diagnosis not present

## 2016-05-13 DIAGNOSIS — R296 Repeated falls: Secondary | ICD-10-CM | POA: Diagnosis not present

## 2016-05-13 DIAGNOSIS — M6380 Disorders of muscle in diseases classified elsewhere, unspecified site: Secondary | ICD-10-CM | POA: Diagnosis not present

## 2016-05-13 DIAGNOSIS — G35 Multiple sclerosis: Secondary | ICD-10-CM | POA: Diagnosis not present

## 2016-05-13 DIAGNOSIS — R278 Other lack of coordination: Secondary | ICD-10-CM | POA: Diagnosis not present

## 2016-05-16 DIAGNOSIS — G35 Multiple sclerosis: Secondary | ICD-10-CM | POA: Diagnosis not present

## 2016-05-16 DIAGNOSIS — M6380 Disorders of muscle in diseases classified elsewhere, unspecified site: Secondary | ICD-10-CM | POA: Diagnosis not present

## 2016-05-16 DIAGNOSIS — R296 Repeated falls: Secondary | ICD-10-CM | POA: Diagnosis not present

## 2016-05-16 DIAGNOSIS — I69851 Hemiplegia and hemiparesis following other cerebrovascular disease affecting right dominant side: Secondary | ICD-10-CM | POA: Diagnosis not present

## 2016-05-16 DIAGNOSIS — R278 Other lack of coordination: Secondary | ICD-10-CM | POA: Diagnosis not present

## 2016-05-16 DIAGNOSIS — M21371 Foot drop, right foot: Secondary | ICD-10-CM | POA: Diagnosis not present

## 2016-05-17 DIAGNOSIS — R278 Other lack of coordination: Secondary | ICD-10-CM | POA: Diagnosis not present

## 2016-05-17 DIAGNOSIS — I69851 Hemiplegia and hemiparesis following other cerebrovascular disease affecting right dominant side: Secondary | ICD-10-CM | POA: Diagnosis not present

## 2016-05-17 DIAGNOSIS — R296 Repeated falls: Secondary | ICD-10-CM | POA: Diagnosis not present

## 2016-05-17 DIAGNOSIS — M6380 Disorders of muscle in diseases classified elsewhere, unspecified site: Secondary | ICD-10-CM | POA: Diagnosis not present

## 2016-05-17 DIAGNOSIS — G35 Multiple sclerosis: Secondary | ICD-10-CM | POA: Diagnosis not present

## 2016-05-17 DIAGNOSIS — M21371 Foot drop, right foot: Secondary | ICD-10-CM | POA: Diagnosis not present

## 2016-05-18 DIAGNOSIS — I69851 Hemiplegia and hemiparesis following other cerebrovascular disease affecting right dominant side: Secondary | ICD-10-CM | POA: Diagnosis not present

## 2016-05-18 DIAGNOSIS — R278 Other lack of coordination: Secondary | ICD-10-CM | POA: Diagnosis not present

## 2016-05-18 DIAGNOSIS — M6380 Disorders of muscle in diseases classified elsewhere, unspecified site: Secondary | ICD-10-CM | POA: Diagnosis not present

## 2016-05-18 DIAGNOSIS — R296 Repeated falls: Secondary | ICD-10-CM | POA: Diagnosis not present

## 2016-05-18 DIAGNOSIS — G35 Multiple sclerosis: Secondary | ICD-10-CM | POA: Diagnosis not present

## 2016-05-18 DIAGNOSIS — M21371 Foot drop, right foot: Secondary | ICD-10-CM | POA: Diagnosis not present

## 2016-05-19 DIAGNOSIS — R278 Other lack of coordination: Secondary | ICD-10-CM | POA: Diagnosis not present

## 2016-05-19 DIAGNOSIS — R296 Repeated falls: Secondary | ICD-10-CM | POA: Diagnosis not present

## 2016-05-19 DIAGNOSIS — I69851 Hemiplegia and hemiparesis following other cerebrovascular disease affecting right dominant side: Secondary | ICD-10-CM | POA: Diagnosis not present

## 2016-05-19 DIAGNOSIS — G35 Multiple sclerosis: Secondary | ICD-10-CM | POA: Diagnosis not present

## 2016-05-19 DIAGNOSIS — M21371 Foot drop, right foot: Secondary | ICD-10-CM | POA: Diagnosis not present

## 2016-05-19 DIAGNOSIS — M6380 Disorders of muscle in diseases classified elsewhere, unspecified site: Secondary | ICD-10-CM | POA: Diagnosis not present

## 2016-05-20 DIAGNOSIS — M545 Low back pain: Secondary | ICD-10-CM | POA: Diagnosis not present

## 2016-05-23 DIAGNOSIS — G35 Multiple sclerosis: Secondary | ICD-10-CM | POA: Diagnosis not present

## 2016-05-23 DIAGNOSIS — I69851 Hemiplegia and hemiparesis following other cerebrovascular disease affecting right dominant side: Secondary | ICD-10-CM | POA: Diagnosis not present

## 2016-05-23 DIAGNOSIS — R296 Repeated falls: Secondary | ICD-10-CM | POA: Diagnosis not present

## 2016-05-23 DIAGNOSIS — M21371 Foot drop, right foot: Secondary | ICD-10-CM | POA: Diagnosis not present

## 2016-05-23 DIAGNOSIS — N319 Neuromuscular dysfunction of bladder, unspecified: Secondary | ICD-10-CM | POA: Diagnosis not present

## 2016-05-23 DIAGNOSIS — R278 Other lack of coordination: Secondary | ICD-10-CM | POA: Diagnosis not present

## 2016-05-23 DIAGNOSIS — M6380 Disorders of muscle in diseases classified elsewhere, unspecified site: Secondary | ICD-10-CM | POA: Diagnosis not present

## 2016-05-24 DIAGNOSIS — M6380 Disorders of muscle in diseases classified elsewhere, unspecified site: Secondary | ICD-10-CM | POA: Diagnosis not present

## 2016-05-24 DIAGNOSIS — R296 Repeated falls: Secondary | ICD-10-CM | POA: Diagnosis not present

## 2016-05-24 DIAGNOSIS — I69851 Hemiplegia and hemiparesis following other cerebrovascular disease affecting right dominant side: Secondary | ICD-10-CM | POA: Diagnosis not present

## 2016-05-24 DIAGNOSIS — R278 Other lack of coordination: Secondary | ICD-10-CM | POA: Diagnosis not present

## 2016-05-24 DIAGNOSIS — G35 Multiple sclerosis: Secondary | ICD-10-CM | POA: Diagnosis not present

## 2016-05-24 DIAGNOSIS — M21371 Foot drop, right foot: Secondary | ICD-10-CM | POA: Diagnosis not present

## 2016-05-25 DIAGNOSIS — M6380 Disorders of muscle in diseases classified elsewhere, unspecified site: Secondary | ICD-10-CM | POA: Diagnosis not present

## 2016-05-25 DIAGNOSIS — M21371 Foot drop, right foot: Secondary | ICD-10-CM | POA: Diagnosis not present

## 2016-05-25 DIAGNOSIS — I69851 Hemiplegia and hemiparesis following other cerebrovascular disease affecting right dominant side: Secondary | ICD-10-CM | POA: Diagnosis not present

## 2016-05-25 DIAGNOSIS — R278 Other lack of coordination: Secondary | ICD-10-CM | POA: Diagnosis not present

## 2016-05-25 DIAGNOSIS — G35 Multiple sclerosis: Secondary | ICD-10-CM | POA: Diagnosis not present

## 2016-05-25 DIAGNOSIS — R296 Repeated falls: Secondary | ICD-10-CM | POA: Diagnosis not present

## 2016-05-30 DIAGNOSIS — R278 Other lack of coordination: Secondary | ICD-10-CM | POA: Diagnosis not present

## 2016-05-30 DIAGNOSIS — I69851 Hemiplegia and hemiparesis following other cerebrovascular disease affecting right dominant side: Secondary | ICD-10-CM | POA: Diagnosis not present

## 2016-05-30 DIAGNOSIS — M6380 Disorders of muscle in diseases classified elsewhere, unspecified site: Secondary | ICD-10-CM | POA: Diagnosis not present

## 2016-05-30 DIAGNOSIS — R296 Repeated falls: Secondary | ICD-10-CM | POA: Diagnosis not present

## 2016-05-30 DIAGNOSIS — G35 Multiple sclerosis: Secondary | ICD-10-CM | POA: Diagnosis not present

## 2016-05-30 DIAGNOSIS — M21371 Foot drop, right foot: Secondary | ICD-10-CM | POA: Diagnosis not present

## 2016-05-31 DIAGNOSIS — G35 Multiple sclerosis: Secondary | ICD-10-CM | POA: Diagnosis not present

## 2016-05-31 DIAGNOSIS — M21371 Foot drop, right foot: Secondary | ICD-10-CM | POA: Diagnosis not present

## 2016-05-31 DIAGNOSIS — R296 Repeated falls: Secondary | ICD-10-CM | POA: Diagnosis not present

## 2016-05-31 DIAGNOSIS — M6380 Disorders of muscle in diseases classified elsewhere, unspecified site: Secondary | ICD-10-CM | POA: Diagnosis not present

## 2016-05-31 DIAGNOSIS — R278 Other lack of coordination: Secondary | ICD-10-CM | POA: Diagnosis not present

## 2016-05-31 DIAGNOSIS — I69851 Hemiplegia and hemiparesis following other cerebrovascular disease affecting right dominant side: Secondary | ICD-10-CM | POA: Diagnosis not present

## 2016-06-07 ENCOUNTER — Non-Acute Institutional Stay (SKILLED_NURSING_FACILITY): Payer: Medicare Other | Admitting: Internal Medicine

## 2016-06-07 ENCOUNTER — Encounter: Payer: Self-pay | Admitting: Internal Medicine

## 2016-06-07 DIAGNOSIS — L821 Other seborrheic keratosis: Secondary | ICD-10-CM | POA: Diagnosis not present

## 2016-06-07 DIAGNOSIS — M6380 Disorders of muscle in diseases classified elsewhere, unspecified site: Secondary | ICD-10-CM | POA: Diagnosis not present

## 2016-06-07 DIAGNOSIS — L814 Other melanin hyperpigmentation: Secondary | ICD-10-CM | POA: Diagnosis not present

## 2016-06-07 DIAGNOSIS — M21371 Foot drop, right foot: Secondary | ICD-10-CM | POA: Diagnosis not present

## 2016-06-07 DIAGNOSIS — R278 Other lack of coordination: Secondary | ICD-10-CM | POA: Diagnosis not present

## 2016-06-07 DIAGNOSIS — N319 Neuromuscular dysfunction of bladder, unspecified: Secondary | ICD-10-CM | POA: Diagnosis not present

## 2016-06-07 DIAGNOSIS — R531 Weakness: Secondary | ICD-10-CM | POA: Diagnosis not present

## 2016-06-07 DIAGNOSIS — N400 Enlarged prostate without lower urinary tract symptoms: Secondary | ICD-10-CM | POA: Diagnosis not present

## 2016-06-07 DIAGNOSIS — G35 Multiple sclerosis: Secondary | ICD-10-CM

## 2016-06-07 DIAGNOSIS — R296 Repeated falls: Secondary | ICD-10-CM | POA: Diagnosis not present

## 2016-06-07 DIAGNOSIS — I69851 Hemiplegia and hemiparesis following other cerebrovascular disease affecting right dominant side: Secondary | ICD-10-CM | POA: Diagnosis not present

## 2016-06-07 NOTE — Progress Notes (Signed)
Location:  [name] Room Number: 119 Place of Service:  SNF (31) Provider:   L. , D.O., C.M.D.  , , DO  Patient Care Team:  L , DO as PCP - General (Geriatric Medicine) Vikram R Penumalli, MD as Consulting Physician (Neurology) Robert J Evans, MD as Consulting Physician (Urology) Todd Rosenbower, MD as Consulting Physician (General Surgery) Daniel Caffrey, MD as Consulting Physician (Orthopedic Surgery) Haywood Ingram, MD as Consulting Physician (General Surgery) Martin K Johnson, MD as Consulting Physician (Gastroenterology)  L , DO as Consulting Physician (Geriatric Medicine)  Extended Emergency Contact Information Primary Emergency Contact: Broner,Peggy Address: [address]          [place], [place] [address] [place] Home Phone: [phone] Mobile Phone: [phone] Relation: Spouse  Code Status:  DNR Goals of care: Advanced Directive information Advanced Directives 06/07/2016  Does patient have an advance directive? Yes  Type of Advance Directive Healthcare Power of Attorney;Living will;Out of facility DNR (pink MOST or yellow form)  Does patient want to make changes to advanced directive? No - Patient declined  Copy of advanced directive(s) in chart? Yes  Would patient like information on creating an advanced directive? -  Pre-existing out of facility DNR order (yellow form or pink MOST form) Yellow form placed in chart (order not valid for inpatient use);Pink MOST form placed in chart (order not valid for inpatient use)    Chief Complaint  Patient presents with  . Acute Visit    persistent urinary frequency, need for a catheter    HPI:  Pt is a 75 y.o. male with a h/o MS, dementia, neurogenic bladder and BPH was seen today for an acute visit for ongoing difficulty with urinary frequency and calls to nursing staff.  Staff had discussed that a foley is needed to prevent  excessive trips to the bathroom and hopefully to ease resident's anxiety.  I spoke with Dr. Robert Evans, his urologist, by phone and we agreed that this would be in his best interest for his QOL.  We arranged for me to speak with his wife today about it.  The recommendation is short-term foley placement until a suprapubic catheter can be inserted at Dr. Evans' office.  I met with pt and his wife, Margaret.  We discussed the reasons for the catheter placement at this point.  They are in agreement.  She had some concerns about whether he could still work with the trainer 2x per week (yes) and if there might be difficulties with him forgetting why it's there and pulling on it (discussed that it would be painful if he pulls on it due to the balloon inflated inside, but that patients with dementia do sometimes do this at first).    Margaret also felt like a friend of his thought he was more sedated than usual; however, his medications have not been changed at all recently.    Past Medical History:  Diagnosis Date  . Adenomatous polyp   . Allergy   . BPH (benign prostatic hyperplasia)   . COPD (chronic obstructive pulmonary disease) (HCC)   . Dementia 2010  . Elevated PSA 07/31/2014  . Foot drop, right 2008  . 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 Generalized weakness 01/12/2013  . GERD (gastroesophageal reflux disease)   . Hyperlipidemia 07/31/2014  . Multiple sclerosis (HCC) 2008  . Prostatitis, chronic    Past Surgical History:  Procedure Laterality Date  . CATARACT EXTRACTION Left 02/06/12  . CATARACT EXTRACTION   Right 2013  . ESOPHAGOGASTRODUODENOSCOPY ENDOSCOPY  04/16/2002   Dr. Martin Johnson  . HERNIA REPAIR  1980'2000   3 inguinal hernia repairs on left  . HERNIA REPAIR  06/16/11   right inguinal hernia repair with mesh   . PROSTATE SURGERY  09/29/2008   Dr. Ottelin     Allergies  Allergen Reactions  . Aricept [Donepezil Hcl] Other (See Comments)    Reaction:  Unknown   . Dimethyl  Fumarate Nausea And Vomiting      Medication List       Accurate as of 06/07/16  9:58 AM. Always use your most recent med list.          aspirin 81 MG chewable tablet Chew 81 mg by mouth daily.   Biotin 5 MG Tabs Take 5 mg by mouth daily.   chlorhexidine 0.12 % solution Commonly known as:  PERIDEX Use as directed 15 mLs in the mouth or throat 2 (two) times daily. Pt is to swish and spit.   cholecalciferol 1000 units tablet Commonly known as:  VITAMIN D Take 2,000 Units by mouth daily.   ciclopirox 0.77 % cream Commonly known as:  LOPROX Apply 1 application topically 2 (two) times daily. Pt applies to face.   docusate sodium 100 MG capsule Commonly known as:  COLACE Take 100 mg by mouth at bedtime as needed for mild constipation or moderate constipation.   ketoconazole 2 % shampoo Commonly known as:  NIZORAL Apply 1 application topically 2 (two) times a week. Pt uses on Tuesday and Friday.   loperamide 2 MG capsule Commonly known as:  IMODIUM Take 4 mg by mouth as needed for diarrhea or loose stools (at onset of loose stool and 1 tab (2mg) after each sucessive loose stool).   magnesium hydroxide 400 MG/5ML suspension Commonly known as:  MILK OF MAGNESIA Take 15 mLs by mouth daily as needed for mild constipation or moderate constipation.   memantine 10 MG tablet Commonly known as:  NAMENDA Take 10 mg by mouth 2 (two) times daily.   naproxen sodium 220 MG tablet Commonly known as:  ANAPROX Take 220 mg by mouth every 8 (eight) hours as needed (for discomfort).   sertraline 50 MG tablet Commonly known as:  ZOLOFT Take 75 mg by mouth daily.   silodosin 8 MG Caps capsule Commonly known as:  RAPAFLO Take 8 mg by mouth at bedtime.       Review of Systems  Constitutional: Negative for chills and fever.  HENT: Negative for congestion and hearing loss.   Eyes: Negative for blurred vision.       Glasses  Respiratory: Negative for shortness of breath.     Cardiovascular: Negative for chest pain.  Gastrointestinal: Negative for abdominal pain, blood in stool, constipation, diarrhea and melena.  Genitourinary: Positive for frequency and urgency. Negative for dysuria, flank pain and hematuria.       Retention, difficulty starting stream  Musculoskeletal: Positive for falls.  Skin: Positive for itching. Negative for rash.       Dry scaly skin on face  Neurological: Negative for dizziness and loss of consciousness.  Psychiatric/Behavioral: Positive for memory loss. Negative for depression.    Immunization History  Administered Date(s) Administered  . Influenza-Unspecified 07/02/2015  . Pneumococcal Conjugate-13 02/10/2015  . Pneumococcal Polysaccharide-23 10/23/2006  . Tdap 04/19/2005   Pertinent  Health Maintenance Due  Topic Date Due  . INFLUENZA VACCINE  04/19/2016  . COLONOSCOPY  01/14/2019  . PNA vac Low   Risk Adult  Completed   Fall Risk  02/17/2016 08/23/2015 08/19/2015 08/11/2014 07/05/2014  Falls in the past year? No Yes Yes Yes Yes  Number falls in past yr: - 1 2 or more 2 or more -  Injury with Fall? - Yes Yes - -  Risk Factor Category  - High Fall Risk - - -  Risk for fall due to : - History of fall(s);Impaired balance/gait;Impaired mobility;Medication side effect;Mental status change - - Impaired balance/gait  Follow up - Falls evaluation completed;Education provided;Falls prevention discussed - - -   Functional Status Survey:    Vitals:   06/07/16 0957  BP: 118/75  Pulse: 86  Resp: 18  Temp: 97.6 F (36.4 C)  SpO2: 93%  Weight: 178 lb 12.8 oz (81.1 kg)  Height: 6' 1" (1.854 m)   Body mass index is 23.59 kg/m. Physical Exam  Constitutional: He appears well-developed and well-nourished. No distress.  Cardiovascular: Normal rate, regular rhythm, normal heart sounds and intact distal pulses.   Pulmonary/Chest: Effort normal and breath sounds normal.  Abdominal: Soft. Bowel sounds are normal.  Musculoskeletal:   Foot drop; sitting in recliner chair after breakfast  Neurological: He is alert.  Skin:  Seborrheic dermatitis of face  Psychiatric:  Very pleasant, laughs frequently    Labs reviewed:  Recent Labs  08/20/15 0546 05/09/16 1102  NA 137 140  K 4.2 3.6  BUN 22* 18  CREATININE 0.8 0.8    Recent Labs  08/20/15 0546 05/09/16 1102  AST 17 19  ALT 10 17  ALKPHOS 56 73    Recent Labs  08/20/15 0546 05/09/16 1102  WBC 6.0 5.8  HGB 15.0 13.9  HCT 44 39*  PLT 149* 231   Lab Results  Component Value Date   TSH 0.484 01/13/2013   Lab Results  Component Value Date   HGBA1C 5.7 (H) 01/13/2013   Lab Results  Component Value Date   CHOL 207 (A) 08/20/2015   HDL 50 08/20/2015   LDLCALC 135 08/20/2015   TRIG 111 08/20/2015   CHOLHDL 3.9 05/13/2014    Assessment/Plan 1. Neurogenic bladder -is worsening, he has been getting up to urinate 4-5x in a half day, he c/o difficulty going and goes small volumes at a time -he also notes some discomfort sometimes when he feels he has to go -discussed with Dr. Evans who agrees that we should attempt catheter placement at this time and discussed with his wife who is hesitant but agreed -place foley with leg bag today -arrange appt with Dr. Evans for suprapubic catheter placement thereafter as this is a longstanding problem and erosion becomes a risk with long term foleys  2. Multiple sclerosis (HCC) -has worsening of his dementia related to this -we will have to monitor him closely to avoid trauma after catheter placement  3. BPH (benign prostatic hyperplasia) -also contributory, has rapaflo -again, referred to Dr. Robert Evans for urologic f/u and suprapubic placement  4. Generalized weakness -due to advancing MS, leading to falls, needs assistance for safe transfers now which is a big part of why he moved to skilled care, continues to fall frequently and has poor insight into his illness due to dementia  Family/ staff  Communication: discussed with snf nursing and pt's wife also  Labs/tests ordered:  No new   L. , D.O. Geriatrics [facility] [address] [place], [place] [address] Cell Phone (Mon-Fri 8am-5pm):  [phone] On Call:  [phone] & follow   prompts after 5pm & weekends Office Phone:  [phone] Office Fax:  [phone]   

## 2016-06-09 DIAGNOSIS — N319 Neuromuscular dysfunction of bladder, unspecified: Secondary | ICD-10-CM | POA: Diagnosis not present

## 2016-06-09 DIAGNOSIS — R338 Other retention of urine: Secondary | ICD-10-CM | POA: Diagnosis not present

## 2016-06-09 DIAGNOSIS — G35 Multiple sclerosis: Secondary | ICD-10-CM | POA: Diagnosis not present

## 2016-06-10 DIAGNOSIS — R296 Repeated falls: Secondary | ICD-10-CM | POA: Diagnosis not present

## 2016-06-10 DIAGNOSIS — M21371 Foot drop, right foot: Secondary | ICD-10-CM | POA: Diagnosis not present

## 2016-06-10 DIAGNOSIS — M6380 Disorders of muscle in diseases classified elsewhere, unspecified site: Secondary | ICD-10-CM | POA: Diagnosis not present

## 2016-06-10 DIAGNOSIS — I69851 Hemiplegia and hemiparesis following other cerebrovascular disease affecting right dominant side: Secondary | ICD-10-CM | POA: Diagnosis not present

## 2016-06-10 DIAGNOSIS — R278 Other lack of coordination: Secondary | ICD-10-CM | POA: Diagnosis not present

## 2016-06-10 DIAGNOSIS — G35 Multiple sclerosis: Secondary | ICD-10-CM | POA: Diagnosis not present

## 2016-06-14 DIAGNOSIS — M21371 Foot drop, right foot: Secondary | ICD-10-CM | POA: Diagnosis not present

## 2016-06-14 DIAGNOSIS — I69851 Hemiplegia and hemiparesis following other cerebrovascular disease affecting right dominant side: Secondary | ICD-10-CM | POA: Diagnosis not present

## 2016-06-14 DIAGNOSIS — M6380 Disorders of muscle in diseases classified elsewhere, unspecified site: Secondary | ICD-10-CM | POA: Diagnosis not present

## 2016-06-14 DIAGNOSIS — G35 Multiple sclerosis: Secondary | ICD-10-CM | POA: Diagnosis not present

## 2016-06-14 DIAGNOSIS — R278 Other lack of coordination: Secondary | ICD-10-CM | POA: Diagnosis not present

## 2016-06-14 DIAGNOSIS — R296 Repeated falls: Secondary | ICD-10-CM | POA: Diagnosis not present

## 2016-06-16 DIAGNOSIS — R278 Other lack of coordination: Secondary | ICD-10-CM | POA: Diagnosis not present

## 2016-06-16 DIAGNOSIS — M21371 Foot drop, right foot: Secondary | ICD-10-CM | POA: Diagnosis not present

## 2016-06-16 DIAGNOSIS — I69851 Hemiplegia and hemiparesis following other cerebrovascular disease affecting right dominant side: Secondary | ICD-10-CM | POA: Diagnosis not present

## 2016-06-16 DIAGNOSIS — R296 Repeated falls: Secondary | ICD-10-CM | POA: Diagnosis not present

## 2016-06-16 DIAGNOSIS — M6380 Disorders of muscle in diseases classified elsewhere, unspecified site: Secondary | ICD-10-CM | POA: Diagnosis not present

## 2016-06-16 DIAGNOSIS — G35 Multiple sclerosis: Secondary | ICD-10-CM | POA: Diagnosis not present

## 2016-06-17 DIAGNOSIS — Z9889 Other specified postprocedural states: Secondary | ICD-10-CM | POA: Diagnosis not present

## 2016-06-17 DIAGNOSIS — G35 Multiple sclerosis: Secondary | ICD-10-CM | POA: Diagnosis not present

## 2016-06-17 DIAGNOSIS — Z7982 Long term (current) use of aspirin: Secondary | ICD-10-CM | POA: Diagnosis not present

## 2016-06-17 DIAGNOSIS — N319 Neuromuscular dysfunction of bladder, unspecified: Secondary | ICD-10-CM | POA: Diagnosis not present

## 2016-06-17 DIAGNOSIS — R338 Other retention of urine: Secondary | ICD-10-CM | POA: Diagnosis not present

## 2016-06-17 DIAGNOSIS — R339 Retention of urine, unspecified: Secondary | ICD-10-CM | POA: Diagnosis not present

## 2016-06-17 DIAGNOSIS — F0390 Unspecified dementia without behavioral disturbance: Secondary | ICD-10-CM | POA: Diagnosis not present

## 2016-06-20 ENCOUNTER — Non-Acute Institutional Stay (SKILLED_NURSING_FACILITY): Payer: Medicare Other | Admitting: Adult Health

## 2016-06-20 ENCOUNTER — Encounter: Payer: Self-pay | Admitting: Adult Health

## 2016-06-20 DIAGNOSIS — N319 Neuromuscular dysfunction of bladder, unspecified: Secondary | ICD-10-CM

## 2016-06-20 DIAGNOSIS — F0281 Dementia in other diseases classified elsewhere with behavioral disturbance: Secondary | ICD-10-CM | POA: Diagnosis not present

## 2016-06-20 DIAGNOSIS — F02818 Dementia in other diseases classified elsewhere, unspecified severity, with other behavioral disturbance: Secondary | ICD-10-CM

## 2016-06-20 DIAGNOSIS — Z9359 Other cystostomy status: Secondary | ICD-10-CM | POA: Insufficient documentation

## 2016-06-20 DIAGNOSIS — F0391 Unspecified dementia with behavioral disturbance: Secondary | ICD-10-CM | POA: Insufficient documentation

## 2016-06-20 DIAGNOSIS — F03918 Unspecified dementia, unspecified severity, with other behavioral disturbance: Secondary | ICD-10-CM | POA: Insufficient documentation

## 2016-06-20 NOTE — Progress Notes (Signed)
Patient ID: [name], male   DOB: 02-03-1941, 74 y.o.   MRN: [id number]  Location:   [name]   Place of Service:  SNF (31) Provider:   Cindi Carbon, ANP [place] [phone]   [name], [name], DO  Patient Care Team: [name], DO as PCP - General (Geriatric Medicine) [name], MD as Consulting Physician (Neurology) [name], MD as Consulting Physician (Urology) [name], MD as Consulting Physician (General Surgery) [name], MD as Consulting Physician (Orthopedic Surgery) [name], MD as Consulting Physician (General Surgery) [name], MD as Consulting Physician (Gastroenterology) [name], DO as Consulting Physician (Geriatric Medicine)  Extended Emergency Contact Information Primary Emergency Contact: [name] Address: [address]          [place], [place] [address] [name] Phone: [phone] Mobile Phone: [phone] Relation: Spouse  Code Status: DNR Goals of care: Advanced Directive information Advanced Directives 06/07/2016  Does patient have an advance directive? Yes  Type of Paramedic of [place];Living will;Out of facility DNR (pink MOST or yellow form)  Does patient want to make changes to advanced directive? No - Patient declined  Copy of advanced directive(s) in chart? Yes  Would patient like information on creating an advanced directive? -  Pre-existing out of facility DNR order (yellow form or pink MOST form) Yellow form placed in chart (order not valid for inpatient use);Pink MOST form placed in chart (order not valid for inpatient use)     Chief Complaint  Patient presents with  . Acute Visit    urinary urgency    HPI:  Pt is a 75 y.o. male seen today for an acute visit for urinary urgency and pain in penis as reported by staff. Staff also reports he has been agitated and aggressive .  He had a suprapubic catheter placed 9/29  for management of neurogenic bladder secondary to his MS. Prior to the suprapubic catheter placement, a foley catheter was inserted producing gross hematuria and trauma on insertion and the catheter was d/c'ed. He was started on Bactrim on 9/29 for UTI. He currently has a catheter bag which drains to gravity. Urine in bag is straw colored and clear. He currently denies urinary frequency.  He recently moved to skilled care from assisted living. The AL staff said that it is not unusual for him to become agitated. Staff reports that he has pulled off 4 stat-lock devices used to hold catheter against his leg since having suprapubic catheter placed. Today he is pleasant, follows commands.     Past Medical History:  Diagnosis Date  . Adenomatous polyp   . Allergy   . BPH (benign prostatic hyperplasia)   . COPD (chronic obstructive pulmonary disease) ([facility])   . Dementia 2010  . Elevated PSA 07/31/2014  . Foot drop, right 2008  . 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 Generalized weakness 01/12/2013  . GERD (gastroesophageal reflux disease)   . Hyperlipidemia 07/31/2014  . Multiple sclerosis ([facility] [address]) 2008  . Prostatitis, chronic    Past Surgical History:  Procedure Laterality Date  . CATARACT EXTRACTION Left 02/06/12  . CATARACT EXTRACTION Right 2013  . ESOPHAGOGASTRODUODENOSCOPY ENDOSCOPY  04/16/2002   Dr. Earle Gell  . HERNIA REPAIR  1980'2000   3 inguinal hernia repairs on left  . HERNIA REPAIR  06/16/11   right inguinal hernia repair with mesh   . PROSTATE SURGERY  09/29/2008   Dr. Karsten Ro  Allergies  Allergen Reactions  . Aricept [Donepezil Hcl] Other (See Comments)    Reaction:  Unknown   . Dimethyl Fumarate Nausea And Vomiting      Medication List       Accurate as of 06/20/16 11:10 AM. Always use your most recent med list.          aspirin 81 MG chewable tablet Chew 81 mg by mouth daily.   Biotin 5 MG Tabs Take 5 mg by mouth daily.   chlorhexidine 0.12 % solution Commonly  known as:  PERIDEX Use as directed 15 mLs in the mouth or throat 2 (two) times daily. Pt is to swish and spit.   cholecalciferol 1000 units tablet Commonly known as:  VITAMIN D Take 2,000 Units by mouth daily.   ciclopirox 0.77 % cream Commonly known as:  LOPROX Apply 1 application topically 2 (two) times daily. Pt applies to face.   docusate sodium 100 MG capsule Commonly known as:  COLACE Take 100 mg by mouth at bedtime as needed for mild constipation or moderate constipation.   ketoconazole 2 % shampoo Commonly known as:  NIZORAL Apply 1 application topically 2 (two) times a week. Pt uses on Tuesday and Friday.   loperamide 2 MG capsule Commonly known as:  IMODIUM Take 4 mg by mouth as needed for diarrhea or loose stools (at onset of loose stool and 1 tab (2mg ) after each sucessive loose stool).   magnesium hydroxide 400 MG/5ML suspension Commonly known as:  MILK OF MAGNESIA Take 15 mLs by mouth daily as needed for mild constipation or moderate constipation.   memantine 10 MG tablet Commonly known as:  NAMENDA Take 10 mg by mouth 2 (two) times daily.   naproxen sodium 220 MG tablet Commonly known as:  ANAPROX Take 220 mg by mouth every 8 (eight) hours as needed (for discomfort).   sertraline 50 MG tablet Commonly known as:  ZOLOFT Take 75 mg by mouth daily.   silodosin 8 MG Caps capsule Commonly known as:  RAPAFLO Take 8 mg by mouth at bedtime.       Review of Systems  Constitutional: Negative for activity change, appetite change, chills, diaphoresis, fatigue, fever and unexpected weight change.  Respiratory: Negative for cough, shortness of breath, wheezing and stridor.   Cardiovascular: Negative for chest pain, palpitations and leg swelling.  Gastrointestinal: Negative for abdominal distention, abdominal pain, constipation and diarrhea.  Genitourinary: Positive for frequency and urgency. Negative for decreased urine volume, difficulty urinating, dysuria, flank  pain, genital sores, hematuria, penile pain, scrotal swelling and testicular pain.  Musculoskeletal: Positive for gait problem. Negative for arthralgias, back pain, joint swelling and myalgias.  Neurological: Positive for weakness (right sided). Negative for dizziness, tremors, seizures, syncope, facial asymmetry, speech difficulty and headaches.  Hematological: Negative for adenopathy. Does not bruise/bleed easily.  Psychiatric/Behavioral: Positive for agitation and behavioral problems. Negative for hallucinations and sleep disturbance.    Immunization History  Administered Date(s) Administered  . Influenza-Unspecified 07/02/2015  . Pneumococcal Conjugate-13 02/10/2015  . Pneumococcal Polysaccharide-23 10/23/2006  . Tdap 04/19/2005   Pertinent  Health Maintenance Due  Topic Date Due  . INFLUENZA VACCINE  04/19/2016  . COLONOSCOPY  01/14/2019  . PNA vac Low Risk Adult  Completed   Fall Risk  02/17/2016 08/23/2015 08/19/2015 08/11/2014 07/05/2014  Falls in the past year? No Yes Yes Yes Yes  Number falls in past yr: - 1 2 or more 2 or more -  Injury with Fall? - Yes Yes - -  Risk Factor Category  - High Fall Risk - - -  Risk for fall due to : - History of fall(s);Impaired balance/gait;Impaired mobility;Medication side effect;Mental status change - - Impaired balance/gait  Follow up - Falls evaluation completed;Education provided;Falls prevention discussed - - -   Functional Status Survey:    Vitals:   06/20/16 1034  BP: 97/65  Pulse: 76  Resp: 16  Temp: 97.9 F (36.6 C)  SpO2: 97%  Weight: 173 lb 12.8 oz (78.8 kg)   Body mass index is 22.93 kg/m. Physical Exam  Constitutional: He appears well-developed and well-nourished. No distress.  HENT:  Head: Normocephalic and atraumatic.  Eyes: Right eye exhibits no discharge. Left eye exhibits no discharge.  Neck: Normal range of motion. Neck supple. No JVD present.  Cardiovascular: Normal rate, regular rhythm and normal heart  sounds.   No murmur heard. Pulmonary/Chest: Effort normal and breath sounds normal. No respiratory distress. He has no wheezes. He exhibits no tenderness.  Abdominal: Soft. Bowel sounds are normal. He exhibits no distension. There is no tenderness.  He has some slight serosanguinous drainage at insertion site of suprapubic cath. No pus drainage noted.    Musculoskeletal: Normal range of motion. He exhibits no edema or tenderness.  Lymphadenopathy:    He has no cervical adenopathy.  Neurological: He is alert. He displays normal reflexes. No cranial nerve deficit.  Oriented to self, place, and not time or situation. Able to f/c.  Weakness noted to RUE and RLE 3/5  Skin: Skin is warm and dry. He is not diaphoretic.  Psychiatric: He has a normal mood and affect.  pleasant  Nursing note and vitals reviewed.   Labs reviewed:  Recent Labs  08/20/15 0546 05/09/16 1102  NA 137 140  K 4.2 3.6  BUN 22* 18  CREATININE 0.8 0.8    Recent Labs  08/20/15 0546 05/09/16 1102  AST 17 19  ALT 10 17  ALKPHOS 56 73    Recent Labs  08/20/15 0546 05/09/16 1102  WBC 6.0 5.8  HGB 15.0 13.9  HCT 44 39*  PLT 149* 231   Lab Results  Component Value Date   TSH 0.484 01/13/2013   Lab Results  Component Value Date   HGBA1C 5.7 (H) 01/13/2013   Lab Results  Component Value Date   CHOL 207 (A) 08/20/2015   HDL 50 08/20/2015   LDLCALC 135 08/20/2015   TRIG 111 08/20/2015   CHOLHDL 3.9 05/13/2014    Significant Diagnostic Results in last 30 days:  No results found.  Assessment/Plan 1. Dementia associated with other underlying disease with behavioral disturbance - progressive requiring more assistance and monitoring, now in skilled care -continue Namenda -Suspect that his behavioral disturbances are the result of dementia as opposed to an infectious process. -Increase Zoloft dose to 100 mg q day. -Place abdominal binder in attempt to keep patient from manipulating suprapubic  catheter placement  2. Neurogenic bladder -Consulting with Urology in consideration of ordering Myrbetriq 25 mg q day for control of bladder spasms.   3. Suprapubic catheter [facility]) - He is followed by Urology.  -Next f/u appt in Nov. - Also consulting with urology to d/c Rapaflo for BPH since he has a suprapubic catheter.   Discussed plan of care with pt's wife. She is agreeable to the plan of care.

## 2016-06-21 DIAGNOSIS — B351 Tinea unguium: Secondary | ICD-10-CM | POA: Diagnosis not present

## 2016-06-21 DIAGNOSIS — M2041 Other hammer toe(s) (acquired), right foot: Secondary | ICD-10-CM | POA: Diagnosis not present

## 2016-06-23 DIAGNOSIS — R296 Repeated falls: Secondary | ICD-10-CM | POA: Diagnosis not present

## 2016-06-23 DIAGNOSIS — I69851 Hemiplegia and hemiparesis following other cerebrovascular disease affecting right dominant side: Secondary | ICD-10-CM | POA: Diagnosis not present

## 2016-06-23 DIAGNOSIS — M21371 Foot drop, right foot: Secondary | ICD-10-CM | POA: Diagnosis not present

## 2016-06-23 DIAGNOSIS — N319 Neuromuscular dysfunction of bladder, unspecified: Secondary | ICD-10-CM | POA: Diagnosis not present

## 2016-06-23 DIAGNOSIS — M6380 Disorders of muscle in diseases classified elsewhere, unspecified site: Secondary | ICD-10-CM | POA: Diagnosis not present

## 2016-06-23 DIAGNOSIS — G35 Multiple sclerosis: Secondary | ICD-10-CM | POA: Diagnosis not present

## 2016-06-23 DIAGNOSIS — R278 Other lack of coordination: Secondary | ICD-10-CM | POA: Diagnosis not present

## 2016-06-28 DIAGNOSIS — I69851 Hemiplegia and hemiparesis following other cerebrovascular disease affecting right dominant side: Secondary | ICD-10-CM | POA: Diagnosis not present

## 2016-06-28 DIAGNOSIS — G35 Multiple sclerosis: Secondary | ICD-10-CM | POA: Diagnosis not present

## 2016-06-28 DIAGNOSIS — R278 Other lack of coordination: Secondary | ICD-10-CM | POA: Diagnosis not present

## 2016-06-28 DIAGNOSIS — R296 Repeated falls: Secondary | ICD-10-CM | POA: Diagnosis not present

## 2016-06-28 DIAGNOSIS — M6380 Disorders of muscle in diseases classified elsewhere, unspecified site: Secondary | ICD-10-CM | POA: Diagnosis not present

## 2016-06-28 DIAGNOSIS — M21371 Foot drop, right foot: Secondary | ICD-10-CM | POA: Diagnosis not present

## 2016-06-30 DIAGNOSIS — R278 Other lack of coordination: Secondary | ICD-10-CM | POA: Diagnosis not present

## 2016-06-30 DIAGNOSIS — M6380 Disorders of muscle in diseases classified elsewhere, unspecified site: Secondary | ICD-10-CM | POA: Diagnosis not present

## 2016-06-30 DIAGNOSIS — G35 Multiple sclerosis: Secondary | ICD-10-CM | POA: Diagnosis not present

## 2016-06-30 DIAGNOSIS — I69851 Hemiplegia and hemiparesis following other cerebrovascular disease affecting right dominant side: Secondary | ICD-10-CM | POA: Diagnosis not present

## 2016-06-30 DIAGNOSIS — M21371 Foot drop, right foot: Secondary | ICD-10-CM | POA: Diagnosis not present

## 2016-06-30 DIAGNOSIS — R296 Repeated falls: Secondary | ICD-10-CM | POA: Diagnosis not present

## 2016-07-04 ENCOUNTER — Non-Acute Institutional Stay (SKILLED_NURSING_FACILITY): Payer: Medicare Other | Admitting: Adult Health

## 2016-07-04 ENCOUNTER — Encounter: Payer: Self-pay | Admitting: Adult Health

## 2016-07-04 DIAGNOSIS — G35 Multiple sclerosis: Secondary | ICD-10-CM

## 2016-07-04 DIAGNOSIS — R278 Other lack of coordination: Secondary | ICD-10-CM | POA: Diagnosis not present

## 2016-07-04 DIAGNOSIS — M21371 Foot drop, right foot: Secondary | ICD-10-CM | POA: Diagnosis not present

## 2016-07-04 DIAGNOSIS — L219 Seborrheic dermatitis, unspecified: Secondary | ICD-10-CM

## 2016-07-04 DIAGNOSIS — M6380 Disorders of muscle in diseases classified elsewhere, unspecified site: Secondary | ICD-10-CM | POA: Diagnosis not present

## 2016-07-04 DIAGNOSIS — N319 Neuromuscular dysfunction of bladder, unspecified: Secondary | ICD-10-CM | POA: Diagnosis not present

## 2016-07-04 DIAGNOSIS — I69851 Hemiplegia and hemiparesis following other cerebrovascular disease affecting right dominant side: Secondary | ICD-10-CM | POA: Diagnosis not present

## 2016-07-04 DIAGNOSIS — Z9359 Other cystostomy status: Secondary | ICD-10-CM

## 2016-07-04 DIAGNOSIS — F0281 Dementia in other diseases classified elsewhere with behavioral disturbance: Secondary | ICD-10-CM | POA: Diagnosis not present

## 2016-07-04 DIAGNOSIS — F02818 Dementia in other diseases classified elsewhere, unspecified severity, with other behavioral disturbance: Secondary | ICD-10-CM

## 2016-07-04 DIAGNOSIS — R296 Repeated falls: Secondary | ICD-10-CM | POA: Diagnosis not present

## 2016-07-04 NOTE — Progress Notes (Signed)
Patient ID: [name], male   DOB: March 08, 1941, 75 y.o.   MRN: [id number]  Location:   [name]   Place of Service:  SNF (31) Provider:   Cindi Carbon, ANP [place] [phone]   [name], [name], DO  Patient Care Team: [name], DO as PCP - General (Geriatric Medicine) [name], MD as Consulting Physician (Neurology) [name], MD as Consulting Physician (Urology) [name], MD as Consulting Physician (General Surgery) [name], MD as Consulting Physician (Orthopedic Surgery) [name], MD as Consulting Physician (General Surgery) [name], MD as Consulting Physician (Gastroenterology) [name], DO as Consulting Physician (Geriatric Medicine)  Extended Emergency Contact Information Primary Emergency Contact: [name] Address: [address]          [place], [place] [address] [name] of [place] Phone: [phone] Mobile Phone: [phone] Relation: Spouse  Code Status: DNR Goals of care: Advanced Directive information Advanced Directives 07/04/2016  Does patient have an advance directive? Yes  Type of Paramedic of [place];Living will;Out of facility DNR (pink MOST or yellow form)  Does patient want to make changes to advanced directive? -  Copy of advanced directive(s) in chart? Yes  Would patient like information on creating an advanced directive? -  Pre-existing out of facility DNR order (yellow form or pink MOST form) Yellow form placed in chart (order not valid for inpatient use);Pink MOST form placed in chart (order not valid for inpatient use)     Chief Complaint  Patient presents with  . Medical Management of Chronic Issues    HPI:  75 y.o.male  residing at [facility], skilled care. I am here to review his chronic medical issues.  VS have been stable over the past month. Weight has increased by 5 lbs in the past month. MMSE 02/08/16:  22/30 failed clock poor recall and orientation Using ativan daily due to frequent periods of agitation and requesting to urinate when s/p cath in place. Also using ditropan frequently for bladder spasms.  Aleve used 5 x in 2 weeks for bladder pain as well. 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 S/P cath place 9/29 but continues with symptoms. Staff feel ativan is effective.  He denies any bladder problems and does not remember having any issues in the past month.  He is pleasant for the visit.  Due to his MS he has right arm/leg weakness and wears a brace to the right leg, which seems to be worse. Has issues with dry, red itch rash to his face. Currently using ciclopirox BID and ketoconazole biweekly.   Past Medical History:  Diagnosis Date  . Adenomatous polyp   . Allergy   . BPH (benign prostatic hyperplasia)   . COPD (chronic obstructive pulmonary disease) ([place])   . Dementia 2010  . Elevated PSA 07/31/2014  . Foot drop, right 2008  . 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 Generalized weakness 01/12/2013  . GERD (gastroesophageal reflux disease)   . Hyperlipidemia 07/31/2014  . Multiple sclerosis ([place]) 2008  . Prostatitis, chronic    Past Surgical History:  Procedure Laterality Date  . CATARACT EXTRACTION Left 02/06/12  . CATARACT EXTRACTION Right 2013  . ESOPHAGOGASTRODUODENOSCOPY ENDOSCOPY  04/16/2002   Dr. Earle Gell  . HERNIA REPAIR  1980'2000   3 inguinal hernia repairs on left  . HERNIA REPAIR  06/16/11   right inguinal hernia repair with mesh   . PROSTATE  SURGERY  09/29/2008   Dr. Karsten Ro     Allergies  Allergen Reactions  . Aricept [Donepezil Hcl] Other (See Comments)    Reaction:  Unknown   . Dimethyl Fumarate Nausea And Vomiting      Medication List       Accurate as of 07/04/16 11:42 AM. Always use your most recent med list.          acetaminophen 500 MG tablet Commonly known as:  TYLENOL Take 500 mg by mouth every 8 (eight) hours  as needed. Use for 14 days for penile pain.   aspirin 81 MG chewable tablet Chew 81 mg by mouth daily.   Biotin 5 MG Tabs Take 5 mg by mouth daily.   chlorhexidine 0.12 % solution Commonly known as:  PERIDEX Use as directed 15 mLs in the mouth or throat 2 (two) times daily. Pt is to swish and spit.   cholecalciferol 1000 units tablet Commonly known as:  VITAMIN D Take 2,000 Units by mouth daily.   ciclopirox 0.77 % cream Commonly known as:  LOPROX Apply 1 application topically 2 (two) times daily. Pt applies to face.   docusate sodium 100 MG capsule Commonly known as:  COLACE Take 100 mg by mouth at bedtime as needed for mild constipation or moderate constipation.   ketoconazole 2 % shampoo Commonly known as:  NIZORAL Apply 1 application topically 2 (two) times a week. Pt uses on Tuesday and Friday.   loperamide 2 MG capsule Commonly known as:  IMODIUM Take 4 mg by mouth as needed for diarrhea or loose stools (at onset of loose stool and 1 tab (2mg ) after each sucessive loose stool).   LORazepam 0.5 MG tablet Commonly known as:  ATIVAN Take 0.25 mg by mouth every 8 (eight) hours as needed for anxiety.   magnesium hydroxide 400 MG/5ML suspension Commonly known as:  MILK OF MAGNESIA Take 15 mLs by mouth daily as needed for mild constipation or moderate constipation.   memantine 10 MG tablet Commonly known as:  NAMENDA Take 10 mg by mouth 2 (two) times daily.   naproxen sodium 220 MG tablet Commonly known as:  ANAPROX Take 220 mg by mouth every 8 (eight) hours as needed (for discomfort).   oxybutynin 5 MG tablet Commonly known as:  DITROPAN Take 5 mg by mouth every 8 (eight) hours as needed for bladder spasms.       Review of Systems  Constitutional: Negative for activity change, appetite change, chills, diaphoresis, fatigue, fever and unexpected weight change.  Respiratory: Negative for cough, shortness of breath, wheezing and stridor.   Cardiovascular:  Negative for chest pain, palpitations and leg swelling.  Gastrointestinal: Negative for abdominal distention, abdominal pain, constipation and diarrhea.  Genitourinary: Positive for frequency and urgency. Negative for decreased urine volume, difficulty urinating, dysuria, flank pain, genital sores, hematuria, penile pain, scrotal swelling and testicular pain.  Musculoskeletal: Positive for gait problem. Negative for arthralgias, back pain, joint swelling and myalgias.  Skin: Positive for rash.  Neurological: Positive for weakness (right sided). Negative for dizziness, tremors, seizures, syncope, facial asymmetry, speech difficulty and headaches.  Hematological: Negative for adenopathy. Does not bruise/bleed easily.  Psychiatric/Behavioral: Positive for agitation and behavioral problems. Negative for hallucinations and sleep disturbance.    Immunization History  Administered Date(s) Administered  . Influenza-Unspecified 07/02/2015  . Pneumococcal Conjugate-13 02/10/2015  . Pneumococcal Polysaccharide-23 10/23/2006  . Tdap 04/19/2005   Pertinent  Health Maintenance Due  Topic Date Due  . INFLUENZA VACCINE  04/19/2016  . COLONOSCOPY  01/14/2019  . PNA vac Low Risk Adult  Completed   Fall Risk  07/04/2016 02/17/2016 08/23/2015 08/19/2015 08/11/2014  Falls in the past year? Yes No Yes Yes Yes  Number falls in past yr: 2 or more - 1 2 or more 2 or more  Injury with Fall? Yes - Yes Yes -  Risk Factor Category  High Fall Risk - High Fall Risk - -  Risk for fall due to : History of fall(s);Impaired balance/gait - History of fall(s);Impaired balance/gait;Impaired mobility;Medication side effect;Mental status change - -  Follow up Falls evaluation completed - Falls evaluation completed;Education provided;Falls prevention discussed - -   Functional Status Survey:   Wt Readings from Last 3 Encounters:  07/04/16 178 lb (80.7 kg)  06/20/16 173 lb 12.8 oz (78.8 kg)  06/07/16 178 lb 12.8 oz (81.1  kg)    Vitals:   07/04/16 1123  BP: 97/64  Pulse: 72  Resp: 18  Temp: 98 F (36.7 C)  SpO2: 94%  Weight: 178 lb (80.7 kg)   Body mass index is 23.48 kg/m. Physical Exam  Constitutional: He appears well-developed and well-nourished. No distress.  HENT:  Head: Normocephalic and atraumatic.  Eyes: Right eye exhibits no discharge. Left eye exhibits no discharge.  Neck: Normal range of motion. Neck supple. No JVD present.  Cardiovascular: Normal rate, regular rhythm and normal heart sounds.   No murmur heard. Pulmonary/Chest: Effort normal and breath sounds normal. No respiratory distress. He has no wheezes. He exhibits no tenderness.  Abdominal: Soft. Bowel sounds are normal. He exhibits no distension. There is no tenderness.  He has some slight serosanguinous drainage at insertion site of suprapubic cath. No pus drainage noted.    Musculoskeletal: Normal range of motion. He exhibits no edema or tenderness.  Lymphadenopathy:    He has no cervical adenopathy.  Neurological: He is alert. He displays normal reflexes. No cranial nerve deficit.  Oriented to self, place, and not time or situation. Able to f/c.  Weakness noted to RUE and RLE 3/5  Skin: Skin is warm and dry. Rash (nasolabial folds, scalp, ears) noted. He is not diaphoretic.  Psychiatric: He has a normal mood and affect.  pleasant  Nursing note and vitals reviewed.   Labs reviewed:  Recent Labs  08/20/15 0546 05/09/16 1102  NA 137 140  K 4.2 3.6  BUN 22* 18  CREATININE 0.8 0.8    Recent Labs  08/20/15 0546 05/09/16 1102  AST 17 19  ALT 10 17  ALKPHOS 56 73    Recent Labs  08/20/15 0546 05/09/16 1102  WBC 6.0 5.8  HGB 15.0 13.9  HCT 44 39*  PLT 149* 231   Lab Results  Component Value Date   TSH 0.484 01/13/2013   Lab Results  Component Value Date   HGBA1C 5.7 (H) 01/13/2013   Lab Results  Component Value Date   CHOL 207 (A) 08/20/2015   HDL 50 08/20/2015   LDLCALC 135 08/20/2015    TRIG 111 08/20/2015   CHOLHDL 3.9 05/13/2014    Significant Diagnostic Results in last 30 days:  No results found.  Assessment/Plan  1. Neurogenic bladder Continues to with the sensation to urinate which we believe is behaviors since the behavior did not improve with catheter placement or bladder spasm meds. This causes significant agitation. He should be offered a urinal frequently despite the fact that he does need it to help sooth his anxiety about his bladder.  2. Suprapubic catheter ([place]) Qshift cath care to ensure patency  3. Multiple sclerosis (HCC) Worsening weakness on the right side. Consider consulting with Neuro, although I am not sure he would tolerate going out of the facility Consider therapy consult of resident will participate. The weakness on the right is contributing to his fall risk, as well as his dementia.    4. Seborrheic dermatitis Change the times of the ciclopirox to ensure delivery Place ketoconazole shampoo twice weekly to face as well as scalp and leave on for 5 min during during shower time  5. Dementia associated with other underlying disease with behavioral disturbance Continue namenda Increase ativan to 0.5 mg qam due to aggressive behaviors, agitation, pulling at catheter Continue prn ativan, consider depakote if no improvement   Cindi Carbon, ANP [facility] [phone]

## 2016-07-06 DIAGNOSIS — R278 Other lack of coordination: Secondary | ICD-10-CM | POA: Diagnosis not present

## 2016-07-06 DIAGNOSIS — G35 Multiple sclerosis: Secondary | ICD-10-CM | POA: Diagnosis not present

## 2016-07-06 DIAGNOSIS — M21371 Foot drop, right foot: Secondary | ICD-10-CM | POA: Diagnosis not present

## 2016-07-06 DIAGNOSIS — I69851 Hemiplegia and hemiparesis following other cerebrovascular disease affecting right dominant side: Secondary | ICD-10-CM | POA: Diagnosis not present

## 2016-07-06 DIAGNOSIS — R296 Repeated falls: Secondary | ICD-10-CM | POA: Diagnosis not present

## 2016-07-06 DIAGNOSIS — M6380 Disorders of muscle in diseases classified elsewhere, unspecified site: Secondary | ICD-10-CM | POA: Diagnosis not present

## 2016-07-07 DIAGNOSIS — Z23 Encounter for immunization: Secondary | ICD-10-CM | POA: Diagnosis not present

## 2016-07-12 DIAGNOSIS — R296 Repeated falls: Secondary | ICD-10-CM | POA: Diagnosis not present

## 2016-07-12 DIAGNOSIS — M21371 Foot drop, right foot: Secondary | ICD-10-CM | POA: Diagnosis not present

## 2016-07-12 DIAGNOSIS — G35 Multiple sclerosis: Secondary | ICD-10-CM | POA: Diagnosis not present

## 2016-07-12 DIAGNOSIS — M6380 Disorders of muscle in diseases classified elsewhere, unspecified site: Secondary | ICD-10-CM | POA: Diagnosis not present

## 2016-07-12 DIAGNOSIS — I69851 Hemiplegia and hemiparesis following other cerebrovascular disease affecting right dominant side: Secondary | ICD-10-CM | POA: Diagnosis not present

## 2016-07-12 DIAGNOSIS — R278 Other lack of coordination: Secondary | ICD-10-CM | POA: Diagnosis not present

## 2016-07-20 DIAGNOSIS — G35 Multiple sclerosis: Secondary | ICD-10-CM | POA: Diagnosis not present

## 2016-07-20 DIAGNOSIS — R488 Other symbolic dysfunctions: Secondary | ICD-10-CM | POA: Diagnosis not present

## 2016-07-20 DIAGNOSIS — R2689 Other abnormalities of gait and mobility: Secondary | ICD-10-CM | POA: Diagnosis not present

## 2016-07-20 DIAGNOSIS — F0391 Unspecified dementia with behavioral disturbance: Secondary | ICD-10-CM | POA: Diagnosis not present

## 2016-07-20 DIAGNOSIS — R296 Repeated falls: Secondary | ICD-10-CM | POA: Diagnosis not present

## 2016-07-20 DIAGNOSIS — Z9181 History of falling: Secondary | ICD-10-CM | POA: Diagnosis not present

## 2016-07-20 DIAGNOSIS — M21371 Foot drop, right foot: Secondary | ICD-10-CM | POA: Diagnosis not present

## 2016-07-20 DIAGNOSIS — M6259 Muscle wasting and atrophy, not elsewhere classified, multiple sites: Secondary | ICD-10-CM | POA: Diagnosis not present

## 2016-07-20 DIAGNOSIS — G3281 Cerebellar ataxia in diseases classified elsewhere: Secondary | ICD-10-CM | POA: Diagnosis not present

## 2016-07-21 ENCOUNTER — Telehealth: Payer: Self-pay | Admitting: Diagnostic Neuroimaging

## 2016-07-21 ENCOUNTER — Non-Acute Institutional Stay (SKILLED_NURSING_FACILITY): Payer: Medicare Other | Admitting: Adult Health

## 2016-07-21 DIAGNOSIS — F0281 Dementia in other diseases classified elsewhere with behavioral disturbance: Secondary | ICD-10-CM | POA: Diagnosis not present

## 2016-07-21 DIAGNOSIS — G3281 Cerebellar ataxia in diseases classified elsewhere: Secondary | ICD-10-CM | POA: Diagnosis not present

## 2016-07-21 DIAGNOSIS — F0391 Unspecified dementia with behavioral disturbance: Secondary | ICD-10-CM | POA: Diagnosis not present

## 2016-07-21 DIAGNOSIS — R488 Other symbolic dysfunctions: Secondary | ICD-10-CM | POA: Diagnosis not present

## 2016-07-21 DIAGNOSIS — Z9359 Other cystostomy status: Secondary | ICD-10-CM | POA: Diagnosis not present

## 2016-07-21 DIAGNOSIS — R2689 Other abnormalities of gait and mobility: Secondary | ICD-10-CM | POA: Diagnosis not present

## 2016-07-21 DIAGNOSIS — R296 Repeated falls: Secondary | ICD-10-CM | POA: Diagnosis not present

## 2016-07-21 DIAGNOSIS — F02818 Dementia in other diseases classified elsewhere, unspecified severity, with other behavioral disturbance: Secondary | ICD-10-CM

## 2016-07-21 DIAGNOSIS — M6259 Muscle wasting and atrophy, not elsewhere classified, multiple sites: Secondary | ICD-10-CM | POA: Diagnosis not present

## 2016-07-21 NOTE — Telephone Encounter (Signed)
Sam King/[name] [phone] called to advise, patient has become increasingly symptomatic, dragging his leg, emotional outbursts, "nurse practitioner is thinking of doing Depakote" would like to know Dr. Gladstone Lighter thoughts on this or is there something else that will help patient's progressive MS.

## 2016-07-21 NOTE — Telephone Encounter (Signed)
Spoke with Thomes Dinning and advised him that if patient is experiencing increased symptoms and new problems, Dr Leta Baptist would need to see him for evaluation. Patient last seen April 2016. Mr Edison Pace stated he would call patient's wife to discuss and let this office know. Advised him that unless he had additional information, he can schedule the follow up with phone staff. He verbalized understanding, appreciation.

## 2016-07-22 ENCOUNTER — Encounter: Payer: Self-pay | Admitting: Adult Health

## 2016-07-22 DIAGNOSIS — M6259 Muscle wasting and atrophy, not elsewhere classified, multiple sites: Secondary | ICD-10-CM | POA: Diagnosis not present

## 2016-07-22 DIAGNOSIS — F0391 Unspecified dementia with behavioral disturbance: Secondary | ICD-10-CM | POA: Diagnosis not present

## 2016-07-22 DIAGNOSIS — R2689 Other abnormalities of gait and mobility: Secondary | ICD-10-CM | POA: Diagnosis not present

## 2016-07-22 DIAGNOSIS — R296 Repeated falls: Secondary | ICD-10-CM | POA: Diagnosis not present

## 2016-07-22 DIAGNOSIS — R488 Other symbolic dysfunctions: Secondary | ICD-10-CM | POA: Diagnosis not present

## 2016-07-22 DIAGNOSIS — G3281 Cerebellar ataxia in diseases classified elsewhere: Secondary | ICD-10-CM | POA: Diagnosis not present

## 2016-07-22 NOTE — Progress Notes (Signed)
Patient ID: [name], male   DOB: [birth year], 75 y.o.   MRN: [id number]   Location:  [place]:  SNF (31) Provider:   Cindi Carbon, ANP [place] [phone]   [name], [name], DO  Patient Care Team: [name], DO as PCP - General (Geriatric Medicine) [name], MD as Consulting Physician (Neurology) [name], MD as Consulting Physician (Urology) [name], MD as Consulting Physician (General Surgery) [name], MD as Consulting Physician (Orthopedic Surgery) [name], MD as Consulting Physician (General Surgery) [name], MD as Consulting Physician (Gastroenterology) [name], DO as Consulting Physician (Geriatric Medicine)  Extended Emergency Contact Information Primary Emergency Contact: [name] Address: [address]          [place], [place] [address] [name] of [place] Phone: [phone] Mobile Phone: [phone] Relation: Spouse  Code Status:  DNR Goals of care: Advanced Directive information Advanced Directives 07/04/2016  Does patient have an advance directive? Yes  Type of Paramedic of [place];Living will;Out of facility DNR (pink MOST or yellow form)  Does patient want to make changes to advanced directive? -  Copy of advanced directive(s) in chart? Yes  Would patient like information on creating an advanced directive? -  Pre-existing out of facility DNR order (yellow form or pink MOST form) Yellow form placed in chart (order not valid for inpatient use);Pink MOST form placed in chart (order not valid for inpatient use)     Chief Complaint  Patient presents with  . Acute Visit    agitation, check s/p site    HPI:  Pt is a 75 y.o. male seen today for an acute visit for agitation, attempts to get oob without help, and s/p site check. He has MS and underlying dementia. Makes frequent attempts to get up without  help and falls. Typically he is trying to urinate despite having an s/p cath and walks with his pants around his ankles. His MS is worsening and he drags his right leg more when he walks with his walker. The staff are attempting to make an apt with the neurology.   He is currently on scheduled ativan qam with only mild benefit. He can become angry easily and combative.  He has a s/p cath due to neurogenic bladder but it has not helped his attempts to urinate. Ditropan has has been discontinued due to possible s/e and inefficacy.  S/P site has drainage and erythema. Staff wants be to check the sutures as well which are to be remove in 2 weeks.   Past Medical History:  Diagnosis Date  . Adenomatous polyp   . Allergy   . BPH (benign prostatic hyperplasia)   . COPD (chronic obstructive pulmonary disease) ([place])   . Dementia 2010  . Elevated PSA 07/31/2014  . Foot drop, right 2008  . 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 Generalized weakness 01/12/2013  . GERD (gastroesophageal reflux disease)   . Hyperlipidemia 07/31/2014  . Multiple sclerosis ([place]) 2008  . Prostatitis, chronic    Past Surgical History:  Procedure Laterality Date  . CATARACT EXTRACTION Left 02/06/12  . CATARACT EXTRACTION Right 2013  . ESOPHAGOGASTRODUODENOSCOPY ENDOSCOPY  04/16/2002   Dr. Earle Gell  . HERNIA REPAIR  1980'2000   3 inguinal hernia repairs on left  . HERNIA REPAIR  06/16/11   right inguinal hernia repair with mesh   . PROSTATE SURGERY  09/29/2008   Dr. Karsten Ro  Allergies  Allergen Reactions  . Aricept [Donepezil Hcl] Other (See Comments)    Reaction:  Unknown   . Dimethyl Fumarate Nausea And Vomiting      Medication List       Accurate as of 07/21/16 11:59 PM. Always use your most recent med list.          acetaminophen 500 MG tablet Commonly known as:  TYLENOL Take 500 mg by mouth every 8 (eight) hours as needed. Use for 14 days for penile pain.   aspirin 81 MG chewable tablet Chew 81 mg by  mouth daily.   Biotin 5 MG Tabs Take 5 mg by mouth daily.   chlorhexidine 0.12 % solution Commonly known as:  PERIDEX Use as directed 15 mLs in the mouth or throat 2 (two) times daily. Pt is to swish and spit.   cholecalciferol 1000 units tablet Commonly known as:  VITAMIN D Take 2,000 Units by mouth daily.   ciclopirox 0.77 % cream Commonly known as:  LOPROX Apply 1 application topically 2 (two) times daily. Pt applies to face.   docusate sodium 100 MG capsule Commonly known as:  COLACE Take 100 mg by mouth at bedtime as needed for mild constipation or moderate constipation.   ketoconazole 2 % shampoo Commonly known as:  NIZORAL Apply 1 application topically 2 (two) times a week. Pt uses on Tuesday and Friday.   loperamide 2 MG capsule Commonly known as:  IMODIUM Take 4 mg by mouth as needed for diarrhea or loose stools (at onset of loose stool and 1 tab (2mg ) after each sucessive loose stool).   LORazepam 0.5 MG tablet Commonly known as:  ATIVAN Take 0.5 mg by mouth every 8 (eight) hours as needed for anxiety.   magnesium hydroxide 400 MG/5ML suspension Commonly known as:  MILK OF MAGNESIA Take 15 mLs by mouth daily as needed for mild constipation or moderate constipation.   memantine 10 MG tablet Commonly known as:  NAMENDA Take 10 mg by mouth 2 (two) times daily.   naproxen sodium 220 MG tablet Commonly known as:  ANAPROX Take 220 mg by mouth every 8 (eight) hours as needed (for discomfort).       Review of Systems  Constitutional: Negative for activity change, appetite change, chills, diaphoresis, fatigue, fever and unexpected weight change.  Genitourinary: Positive for difficulty urinating and frequency. Negative for discharge, dysuria, enuresis, genital sores, hematuria and penile pain.  Musculoskeletal: Positive for gait problem.  Skin: Positive for rash.  Neurological: Positive for weakness.  Psychiatric/Behavioral: Positive for agitation, behavioral  problems and confusion.    Immunization History  Administered Date(s) Administered  . Influenza Inj Mdck Quad Pf 07/07/2016  . Influenza-Unspecified 07/02/2015  . Pneumococcal Conjugate-13 02/10/2015  . Pneumococcal Polysaccharide-23 10/23/2006  . Tdap 04/19/2005   Pertinent  Health Maintenance Due  Topic Date Due  . COLONOSCOPY  01/14/2019  . INFLUENZA VACCINE  Addressed  . PNA vac Low Risk Adult  Completed   Fall Risk  07/04/2016 02/17/2016 08/23/2015 08/19/2015 08/11/2014  Falls in the past year? Yes No Yes Yes Yes  Number falls in past yr: 2 or more - 1 2 or more 2 or more  Injury with Fall? Yes - Yes Yes -  Risk Factor Category  High Fall Risk - High Fall Risk - -  Risk for fall due to : History of fall(s);Impaired balance/gait - History of fall(s);Impaired balance/gait;Impaired mobility;Medication side effect;Mental status change - -  Follow up Falls evaluation completed -  Falls evaluation completed;Education provided;Falls prevention discussed - -   Functional Status Survey:    Vitals:   07/22/16 1349  BP: 101/64  Pulse: 81  Resp: (!) 22  Temp: 98 F (36.7 C)  SpO2: 93%   There is no height or weight on file to calculate BMI. Physical Exam  Constitutional: No distress.  Cardiovascular: Normal rate and regular rhythm.   No murmur heard. Pulmonary/Chest: Effort normal and breath sounds normal.  Abdominal: Soft. Bowel sounds are normal. He exhibits no distension. There is no tenderness.  Neurological: He is alert.  Oriented to self only, wants to go home  Skin: He is not diaphoretic.  Erythema and mild purulent drainage around s/p site  Psychiatric: He has a normal mood and affect.    Labs reviewed:  Recent Labs  08/20/15 0546 05/09/16 1102  NA 137 140  K 4.2 3.6  BUN 22* 18  CREATININE 0.8 0.8    Recent Labs  08/20/15 0546 05/09/16 1102  AST 17 19  ALT 10 17  ALKPHOS 56 73    Recent Labs  08/20/15 0546 05/09/16 1102  WBC 6.0 5.8  HGB  15.0 13.9  HCT 44 39*  PLT 149* 231   Lab Results  Component Value Date   TSH 0.484 01/13/2013   Lab Results  Component Value Date   HGBA1C 5.7 (H) 01/13/2013   Lab Results  Component Value Date   CHOL 207 (A) 08/20/2015   HDL 50 08/20/2015   LDLCALC 135 08/20/2015   TRIG 111 08/20/2015   CHOLHDL 3.9 05/13/2014    Significant Diagnostic Results in last 30 days:  No results found.  Assessment/Plan 1. Dementia associated with other underlying disease with behavioral disturbance Only mild improvement in behavior with ativan, continue to make frequent attempts to get oob and gets agitated with staff, can be combative Begin depakote 125 mg qam and titrate to achieve efficacy.  Monitor dep level, CBC, CMP periodically Discussed this med and s/e with his wife, as well as his high fall risk.  She will be hiring sitters to help assist him and prevent falls.  Change ativan to prn, not scheduled  2. Suprapubic catheter ([place]) Sutures are ok but have been manipulated by the resident Site has some yellow drainage and erythema so will begin Bactroban BID to site x 7 days and clean site with H202 and NS qd (1/2 1/2 mixture)    Family/ staff Communication: discussed with staff/resident  Labs/tests ordered:  NA

## 2016-07-25 DIAGNOSIS — R296 Repeated falls: Secondary | ICD-10-CM | POA: Diagnosis not present

## 2016-07-25 DIAGNOSIS — R2689 Other abnormalities of gait and mobility: Secondary | ICD-10-CM | POA: Diagnosis not present

## 2016-07-25 DIAGNOSIS — R488 Other symbolic dysfunctions: Secondary | ICD-10-CM | POA: Diagnosis not present

## 2016-07-25 DIAGNOSIS — M6259 Muscle wasting and atrophy, not elsewhere classified, multiple sites: Secondary | ICD-10-CM | POA: Diagnosis not present

## 2016-07-25 DIAGNOSIS — F0391 Unspecified dementia with behavioral disturbance: Secondary | ICD-10-CM | POA: Diagnosis not present

## 2016-07-25 DIAGNOSIS — G3281 Cerebellar ataxia in diseases classified elsewhere: Secondary | ICD-10-CM | POA: Diagnosis not present

## 2016-07-26 DIAGNOSIS — R296 Repeated falls: Secondary | ICD-10-CM | POA: Diagnosis not present

## 2016-07-26 DIAGNOSIS — M6259 Muscle wasting and atrophy, not elsewhere classified, multiple sites: Secondary | ICD-10-CM | POA: Diagnosis not present

## 2016-07-26 DIAGNOSIS — F0391 Unspecified dementia with behavioral disturbance: Secondary | ICD-10-CM | POA: Diagnosis not present

## 2016-07-26 DIAGNOSIS — R2689 Other abnormalities of gait and mobility: Secondary | ICD-10-CM | POA: Diagnosis not present

## 2016-07-26 DIAGNOSIS — G3281 Cerebellar ataxia in diseases classified elsewhere: Secondary | ICD-10-CM | POA: Diagnosis not present

## 2016-07-26 DIAGNOSIS — R488 Other symbolic dysfunctions: Secondary | ICD-10-CM | POA: Diagnosis not present

## 2016-07-28 ENCOUNTER — Non-Acute Institutional Stay (SKILLED_NURSING_FACILITY): Payer: Medicare Other | Admitting: Adult Health

## 2016-07-28 ENCOUNTER — Encounter: Payer: Self-pay | Admitting: Adult Health

## 2016-07-28 DIAGNOSIS — M6259 Muscle wasting and atrophy, not elsewhere classified, multiple sites: Secondary | ICD-10-CM | POA: Diagnosis not present

## 2016-07-28 DIAGNOSIS — F02818 Dementia in other diseases classified elsewhere, unspecified severity, with other behavioral disturbance: Secondary | ICD-10-CM

## 2016-07-28 DIAGNOSIS — F0281 Dementia in other diseases classified elsewhere with behavioral disturbance: Secondary | ICD-10-CM

## 2016-07-28 DIAGNOSIS — G3281 Cerebellar ataxia in diseases classified elsewhere: Secondary | ICD-10-CM | POA: Diagnosis not present

## 2016-07-28 DIAGNOSIS — R2689 Other abnormalities of gait and mobility: Secondary | ICD-10-CM | POA: Diagnosis not present

## 2016-07-28 DIAGNOSIS — R488 Other symbolic dysfunctions: Secondary | ICD-10-CM | POA: Diagnosis not present

## 2016-07-28 DIAGNOSIS — R296 Repeated falls: Secondary | ICD-10-CM | POA: Diagnosis not present

## 2016-07-28 DIAGNOSIS — F0391 Unspecified dementia with behavioral disturbance: Secondary | ICD-10-CM | POA: Diagnosis not present

## 2016-07-28 NOTE — Progress Notes (Signed)
Patient ID: [name], male   DOB: 08-Sep-1941, 75 y.o.   MRN: [id number]   Location:  [facility] of Service:   SNF Provider:   Cindi Carbon, ANP [facility] [phone]   [name], [name], DO  Patient Care Team: [name], DO as PCP - General (Geriatric Medicine) [name], MD as Consulting Physician (Neurology) [name], MD as Consulting Physician (Urology) [name], MD as Consulting Physician (General Surgery) [name], MD as Consulting Physician (Orthopedic Surgery) [name], MD as Consulting Physician (General Surgery) [name], MD as Consulting Physician (Gastroenterology) [name], DO as Consulting Physician (Geriatric Medicine)  Extended Emergency Contact Information Primary Emergency Contact: [name] Address: [address]          [place], [place] [address] [name] of [place] Phone: [phone] Mobile Phone: [phone] Relation: Spouse  Code Status:  DNR Goals of care: Advanced Directive information Advanced Directives 07/04/2016  Does patient have an advance directive? Yes  Type of Paramedic of [place];Living will;Out of facility DNR (pink MOST or yellow form)  Does patient want to make changes to advanced directive? -  Copy of advanced directive(s) in chart? Yes  Would patient like information on creating an advanced directive? -  Pre-existing out of facility DNR order (yellow form or pink MOST form) Yellow form placed in chart (order not valid for inpatient use);Pink MOST form placed in chart (order not valid for inpatient use)     Chief Complaint  Patient presents with  . Acute Visit    behaviors    HPI:  Pt is a 75 y.o. male seen today for an acute visit for agitation, attempts to get oob without help, and need for frequent assistance.   He has MS and underlying dementia. Makes frequent attempts to get up without help  and falls. Typically he is trying to urinate despite having an s/p cath and walks with his pants around his ankles. His MS is worsening and he drags his right leg more when he walks with his walker.  He was started on depakote 125 mg qd on 11/2 and increased to BID on 11/6 due to increased agitation. The staff indicates that they are in his room every 15 min as he is fixated on urinating and can not be redirected.  This is a long term issue and he has no other signs to indicated UTI.  We suggested 1:1 caregivers to his wife to help prevent falls but she declined.    Past Medical History:  Diagnosis Date  . Adenomatous polyp   . Allergy   . BPH (benign prostatic hyperplasia)   . COPD (chronic obstructive pulmonary disease) ([place])   . Dementia 2010  . Elevated PSA 07/31/2014  . Foot drop, right 2008  . 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 Generalized weakness 01/12/2013  . GERD (gastroesophageal reflux disease)   . Hyperlipidemia 07/31/2014  . Multiple sclerosis ([place]) 2008  . Prostatitis, chronic    Past Surgical History:  Procedure Laterality Date  . CATARACT EXTRACTION Left 02/06/12  . CATARACT EXTRACTION Right 2013  . ESOPHAGOGASTRODUODENOSCOPY ENDOSCOPY  04/16/2002   Dr. Earle Gell  . HERNIA REPAIR  1980'2000   3 inguinal hernia repairs on left  . HERNIA REPAIR  06/16/11   right inguinal hernia repair with mesh   . PROSTATE SURGERY  09/29/2008   Dr. Karsten Ro     Allergies  Allergen Reactions  .  Aricept [Donepezil Hcl] Other (See Comments)    Reaction:  Unknown   . Dimethyl Fumarate Nausea And Vomiting      Medication List       Accurate as of 07/28/16 11:44 AM. Always use your most recent med list.          acetaminophen 500 MG tablet Commonly known as:  TYLENOL Take 500 mg by mouth every 8 (eight) hours as needed. Use for 14 days for penile pain.   aspirin 81 MG chewable tablet Chew 81 mg by mouth daily.   Biotin 5 MG Tabs Take 5 mg by mouth daily.   chlorhexidine  0.12 % solution Commonly known as:  PERIDEX Use as directed 15 mLs in the mouth or throat 2 (two) times daily. Pt is to swish and spit.   cholecalciferol 1000 units tablet Commonly known as:  VITAMIN D Take 2,000 Units by mouth daily.   ciclopirox 0.77 % cream Commonly known as:  LOPROX Apply 1 application topically 2 (two) times daily. Pt applies to face.   docusate sodium 100 MG capsule Commonly known as:  COLACE Take 100 mg by mouth at bedtime as needed for mild constipation or moderate constipation.   ketoconazole 2 % shampoo Commonly known as:  NIZORAL Apply 1 application topically 2 (two) times a week. Pt uses on Tuesday and Friday.   loperamide 2 MG capsule Commonly known as:  IMODIUM Take 4 mg by mouth as needed for diarrhea or loose stools (at onset of loose stool and 1 tab (2mg ) after each sucessive loose stool).   LORazepam 0.5 MG tablet Commonly known as:  ATIVAN Take 0.5 mg by mouth every 8 (eight) hours as needed for anxiety.   magnesium hydroxide 400 MG/5ML suspension Commonly known as:  MILK OF MAGNESIA Take 15 mLs by mouth daily as needed for mild constipation or moderate constipation.   memantine 10 MG tablet Commonly known as:  NAMENDA Take 10 mg by mouth 2 (two) times daily.   naproxen sodium 220 MG tablet Commonly known as:  ANAPROX Take 220 mg by mouth every 8 (eight) hours as needed (for discomfort).       Review of Systems  Constitutional: Negative for activity change, appetite change, chills, diaphoresis, fatigue, fever and unexpected weight change.  Genitourinary: Positive for difficulty urinating and frequency. Negative for discharge, dysuria, enuresis, genital sores, hematuria and penile pain.  Musculoskeletal: Positive for gait problem.  Skin: Positive for rash.  Neurological: Positive for weakness.  Psychiatric/Behavioral: Positive for agitation, behavioral problems and confusion.    Immunization History  Administered Date(s)  Administered  . Influenza Inj Mdck Quad Pf 07/07/2016  . Influenza-Unspecified 07/02/2015  . Pneumococcal Conjugate-13 02/10/2015  . Pneumococcal Polysaccharide-23 10/23/2006  . Tdap 04/19/2005   Pertinent  Health Maintenance Due  Topic Date Due  . COLONOSCOPY  01/14/2019  . INFLUENZA VACCINE  Addressed  . PNA vac Low Risk Adult  Completed   Fall Risk  07/04/2016 02/17/2016 08/23/2015 08/19/2015 08/11/2014  Falls in the past year? Yes No Yes Yes Yes  Number falls in past yr: 2 or more - 1 2 or more 2 or more  Injury with Fall? Yes - Yes Yes -  Risk Factor Category  High Fall Risk - High Fall Risk - -  Risk for fall due to : History of fall(s);Impaired balance/gait - History of fall(s);Impaired balance/gait;Impaired mobility;Medication side effect;Mental status change - -  Follow up Falls evaluation completed - Falls evaluation completed;Education provided;Falls prevention discussed - -  Functional Status Survey:    Vitals:   07/28/16 1138  BP: 110/67  Pulse: 93  Resp: 20  Temp: 97.3 F (36.3 C)  SpO2: 94%  Weight: 167 lb (75.8 kg)   Body mass index is 22.03 kg/m. Physical Exam  Constitutional: No distress.  Cardiovascular: Normal rate and regular rhythm.   No murmur heard. Pulmonary/Chest: Effort normal and breath sounds normal.  Abdominal: Soft. Bowel sounds are normal. He exhibits no distension. There is no tenderness.  Musculoskeletal:  Weakness to right arm and right leg  Neurological: He is alert.  Oriented to self and place  Skin: He is not diaphoretic.  Psychiatric: He has a normal mood and affect.  Nursing note and vitals reviewed.   Labs reviewed:  Recent Labs  08/20/15 0546 05/09/16 1102  NA 137 140  K 4.2 3.6  BUN 22* 18  CREATININE 0.8 0.8    Recent Labs  08/20/15 0546 05/09/16 1102  AST 17 19  ALT 10 17  ALKPHOS 56 73    Recent Labs  08/20/15 0546 05/09/16 1102  WBC 6.0 5.8  HGB 15.0 13.9  HCT 44 39*  PLT 149* 231   Lab  Results  Component Value Date   TSH 0.484 01/13/2013   Lab Results  Component Value Date   HGBA1C 5.7 (H) 01/13/2013   Lab Results  Component Value Date   CHOL 207 (A) 08/20/2015   HDL 50 08/20/2015   LDLCALC 135 08/20/2015   TRIG 111 08/20/2015   CHOLHDL 3.9 05/13/2014    Significant Diagnostic Results in last 30 days:  No results found.  Assessment/Plan  1. Dementia associated with other underlying disease with behavioral disturbance Minimal improvement in behaviors Increase Depakote to 125 mg TID due to continue agitation and attempts to get out of bed and urinate without help    Family/ staff Communication: discussed with staff/resident  Labs/tests ordered:  CBC CMP and Dep level are due next week

## 2016-07-31 DIAGNOSIS — F0391 Unspecified dementia with behavioral disturbance: Secondary | ICD-10-CM | POA: Diagnosis not present

## 2016-07-31 DIAGNOSIS — R488 Other symbolic dysfunctions: Secondary | ICD-10-CM | POA: Diagnosis not present

## 2016-07-31 DIAGNOSIS — G3281 Cerebellar ataxia in diseases classified elsewhere: Secondary | ICD-10-CM | POA: Diagnosis not present

## 2016-07-31 DIAGNOSIS — R2689 Other abnormalities of gait and mobility: Secondary | ICD-10-CM | POA: Diagnosis not present

## 2016-07-31 DIAGNOSIS — M6259 Muscle wasting and atrophy, not elsewhere classified, multiple sites: Secondary | ICD-10-CM | POA: Diagnosis not present

## 2016-07-31 DIAGNOSIS — R296 Repeated falls: Secondary | ICD-10-CM | POA: Diagnosis not present

## 2016-08-01 DIAGNOSIS — R488 Other symbolic dysfunctions: Secondary | ICD-10-CM | POA: Diagnosis not present

## 2016-08-01 DIAGNOSIS — R296 Repeated falls: Secondary | ICD-10-CM | POA: Diagnosis not present

## 2016-08-01 DIAGNOSIS — F0391 Unspecified dementia with behavioral disturbance: Secondary | ICD-10-CM | POA: Diagnosis not present

## 2016-08-01 DIAGNOSIS — G3281 Cerebellar ataxia in diseases classified elsewhere: Secondary | ICD-10-CM | POA: Diagnosis not present

## 2016-08-01 DIAGNOSIS — M6259 Muscle wasting and atrophy, not elsewhere classified, multiple sites: Secondary | ICD-10-CM | POA: Diagnosis not present

## 2016-08-01 DIAGNOSIS — R2689 Other abnormalities of gait and mobility: Secondary | ICD-10-CM | POA: Diagnosis not present

## 2016-08-02 DIAGNOSIS — M6259 Muscle wasting and atrophy, not elsewhere classified, multiple sites: Secondary | ICD-10-CM | POA: Diagnosis not present

## 2016-08-02 DIAGNOSIS — R488 Other symbolic dysfunctions: Secondary | ICD-10-CM | POA: Diagnosis not present

## 2016-08-02 DIAGNOSIS — F0391 Unspecified dementia with behavioral disturbance: Secondary | ICD-10-CM | POA: Diagnosis not present

## 2016-08-02 DIAGNOSIS — G3281 Cerebellar ataxia in diseases classified elsewhere: Secondary | ICD-10-CM | POA: Diagnosis not present

## 2016-08-02 DIAGNOSIS — R2689 Other abnormalities of gait and mobility: Secondary | ICD-10-CM | POA: Diagnosis not present

## 2016-08-02 DIAGNOSIS — R296 Repeated falls: Secondary | ICD-10-CM | POA: Diagnosis not present

## 2016-08-03 DIAGNOSIS — G3281 Cerebellar ataxia in diseases classified elsewhere: Secondary | ICD-10-CM | POA: Diagnosis not present

## 2016-08-03 DIAGNOSIS — R2689 Other abnormalities of gait and mobility: Secondary | ICD-10-CM | POA: Diagnosis not present

## 2016-08-03 DIAGNOSIS — N319 Neuromuscular dysfunction of bladder, unspecified: Secondary | ICD-10-CM | POA: Diagnosis not present

## 2016-08-03 DIAGNOSIS — F0391 Unspecified dementia with behavioral disturbance: Secondary | ICD-10-CM | POA: Diagnosis not present

## 2016-08-03 DIAGNOSIS — R488 Other symbolic dysfunctions: Secondary | ICD-10-CM | POA: Diagnosis not present

## 2016-08-03 DIAGNOSIS — R296 Repeated falls: Secondary | ICD-10-CM | POA: Diagnosis not present

## 2016-08-03 DIAGNOSIS — M6259 Muscle wasting and atrophy, not elsewhere classified, multiple sites: Secondary | ICD-10-CM | POA: Diagnosis not present

## 2016-08-04 DIAGNOSIS — R488 Other symbolic dysfunctions: Secondary | ICD-10-CM | POA: Diagnosis not present

## 2016-08-04 DIAGNOSIS — R296 Repeated falls: Secondary | ICD-10-CM | POA: Diagnosis not present

## 2016-08-04 DIAGNOSIS — F0391 Unspecified dementia with behavioral disturbance: Secondary | ICD-10-CM | POA: Diagnosis not present

## 2016-08-04 DIAGNOSIS — M6259 Muscle wasting and atrophy, not elsewhere classified, multiple sites: Secondary | ICD-10-CM | POA: Diagnosis not present

## 2016-08-04 DIAGNOSIS — R2689 Other abnormalities of gait and mobility: Secondary | ICD-10-CM | POA: Diagnosis not present

## 2016-08-04 DIAGNOSIS — G3281 Cerebellar ataxia in diseases classified elsewhere: Secondary | ICD-10-CM | POA: Diagnosis not present

## 2016-08-05 DIAGNOSIS — F0391 Unspecified dementia with behavioral disturbance: Secondary | ICD-10-CM | POA: Diagnosis not present

## 2016-08-05 DIAGNOSIS — M6259 Muscle wasting and atrophy, not elsewhere classified, multiple sites: Secondary | ICD-10-CM | POA: Diagnosis not present

## 2016-08-05 DIAGNOSIS — R296 Repeated falls: Secondary | ICD-10-CM | POA: Diagnosis not present

## 2016-08-05 DIAGNOSIS — R488 Other symbolic dysfunctions: Secondary | ICD-10-CM | POA: Diagnosis not present

## 2016-08-05 DIAGNOSIS — R2689 Other abnormalities of gait and mobility: Secondary | ICD-10-CM | POA: Diagnosis not present

## 2016-08-05 DIAGNOSIS — G3281 Cerebellar ataxia in diseases classified elsewhere: Secondary | ICD-10-CM | POA: Diagnosis not present

## 2016-08-08 DIAGNOSIS — R350 Frequency of micturition: Secondary | ICD-10-CM | POA: Diagnosis not present

## 2016-08-08 DIAGNOSIS — M6259 Muscle wasting and atrophy, not elsewhere classified, multiple sites: Secondary | ICD-10-CM | POA: Diagnosis not present

## 2016-08-08 DIAGNOSIS — N319 Neuromuscular dysfunction of bladder, unspecified: Secondary | ICD-10-CM | POA: Diagnosis not present

## 2016-08-08 DIAGNOSIS — R296 Repeated falls: Secondary | ICD-10-CM | POA: Diagnosis not present

## 2016-08-08 DIAGNOSIS — F0391 Unspecified dementia with behavioral disturbance: Secondary | ICD-10-CM | POA: Diagnosis not present

## 2016-08-08 DIAGNOSIS — G35 Multiple sclerosis: Secondary | ICD-10-CM | POA: Diagnosis not present

## 2016-08-08 DIAGNOSIS — G3281 Cerebellar ataxia in diseases classified elsewhere: Secondary | ICD-10-CM | POA: Diagnosis not present

## 2016-08-08 DIAGNOSIS — R2689 Other abnormalities of gait and mobility: Secondary | ICD-10-CM | POA: Diagnosis not present

## 2016-08-08 DIAGNOSIS — R488 Other symbolic dysfunctions: Secondary | ICD-10-CM | POA: Diagnosis not present

## 2016-08-09 DIAGNOSIS — M6259 Muscle wasting and atrophy, not elsewhere classified, multiple sites: Secondary | ICD-10-CM | POA: Diagnosis not present

## 2016-08-09 DIAGNOSIS — G3281 Cerebellar ataxia in diseases classified elsewhere: Secondary | ICD-10-CM | POA: Diagnosis not present

## 2016-08-09 DIAGNOSIS — L820 Inflamed seborrheic keratosis: Secondary | ICD-10-CM | POA: Diagnosis not present

## 2016-08-09 DIAGNOSIS — F0391 Unspecified dementia with behavioral disturbance: Secondary | ICD-10-CM | POA: Diagnosis not present

## 2016-08-09 DIAGNOSIS — R2689 Other abnormalities of gait and mobility: Secondary | ICD-10-CM | POA: Diagnosis not present

## 2016-08-09 DIAGNOSIS — D485 Neoplasm of uncertain behavior of skin: Secondary | ICD-10-CM | POA: Diagnosis not present

## 2016-08-09 DIAGNOSIS — R488 Other symbolic dysfunctions: Secondary | ICD-10-CM | POA: Diagnosis not present

## 2016-08-09 DIAGNOSIS — R296 Repeated falls: Secondary | ICD-10-CM | POA: Diagnosis not present

## 2016-08-10 DIAGNOSIS — G3281 Cerebellar ataxia in diseases classified elsewhere: Secondary | ICD-10-CM | POA: Diagnosis not present

## 2016-08-10 DIAGNOSIS — M6259 Muscle wasting and atrophy, not elsewhere classified, multiple sites: Secondary | ICD-10-CM | POA: Diagnosis not present

## 2016-08-10 DIAGNOSIS — E860 Dehydration: Secondary | ICD-10-CM | POA: Diagnosis not present

## 2016-08-10 DIAGNOSIS — R2689 Other abnormalities of gait and mobility: Secondary | ICD-10-CM | POA: Diagnosis not present

## 2016-08-10 DIAGNOSIS — F0391 Unspecified dementia with behavioral disturbance: Secondary | ICD-10-CM | POA: Diagnosis not present

## 2016-08-10 DIAGNOSIS — R296 Repeated falls: Secondary | ICD-10-CM | POA: Diagnosis not present

## 2016-08-10 DIAGNOSIS — R488 Other symbolic dysfunctions: Secondary | ICD-10-CM | POA: Diagnosis not present

## 2016-08-15 DIAGNOSIS — R488 Other symbolic dysfunctions: Secondary | ICD-10-CM | POA: Diagnosis not present

## 2016-08-15 DIAGNOSIS — R2689 Other abnormalities of gait and mobility: Secondary | ICD-10-CM | POA: Diagnosis not present

## 2016-08-15 DIAGNOSIS — G3281 Cerebellar ataxia in diseases classified elsewhere: Secondary | ICD-10-CM | POA: Diagnosis not present

## 2016-08-15 DIAGNOSIS — M6259 Muscle wasting and atrophy, not elsewhere classified, multiple sites: Secondary | ICD-10-CM | POA: Diagnosis not present

## 2016-08-15 DIAGNOSIS — F0391 Unspecified dementia with behavioral disturbance: Secondary | ICD-10-CM | POA: Diagnosis not present

## 2016-08-15 DIAGNOSIS — R296 Repeated falls: Secondary | ICD-10-CM | POA: Diagnosis not present

## 2016-08-16 DIAGNOSIS — R296 Repeated falls: Secondary | ICD-10-CM | POA: Diagnosis not present

## 2016-08-16 DIAGNOSIS — R2689 Other abnormalities of gait and mobility: Secondary | ICD-10-CM | POA: Diagnosis not present

## 2016-08-16 DIAGNOSIS — F0391 Unspecified dementia with behavioral disturbance: Secondary | ICD-10-CM | POA: Diagnosis not present

## 2016-08-16 DIAGNOSIS — M6259 Muscle wasting and atrophy, not elsewhere classified, multiple sites: Secondary | ICD-10-CM | POA: Diagnosis not present

## 2016-08-16 DIAGNOSIS — R488 Other symbolic dysfunctions: Secondary | ICD-10-CM | POA: Diagnosis not present

## 2016-08-16 DIAGNOSIS — G3281 Cerebellar ataxia in diseases classified elsewhere: Secondary | ICD-10-CM | POA: Diagnosis not present

## 2016-08-17 ENCOUNTER — Encounter: Payer: Self-pay | Admitting: Internal Medicine

## 2016-08-17 DIAGNOSIS — R488 Other symbolic dysfunctions: Secondary | ICD-10-CM | POA: Diagnosis not present

## 2016-08-17 DIAGNOSIS — R2689 Other abnormalities of gait and mobility: Secondary | ICD-10-CM | POA: Diagnosis not present

## 2016-08-17 DIAGNOSIS — G3281 Cerebellar ataxia in diseases classified elsewhere: Secondary | ICD-10-CM | POA: Diagnosis not present

## 2016-08-17 DIAGNOSIS — E784 Other hyperlipidemia: Secondary | ICD-10-CM | POA: Diagnosis not present

## 2016-08-17 DIAGNOSIS — F0391 Unspecified dementia with behavioral disturbance: Secondary | ICD-10-CM | POA: Diagnosis not present

## 2016-08-17 DIAGNOSIS — M6259 Muscle wasting and atrophy, not elsewhere classified, multiple sites: Secondary | ICD-10-CM | POA: Diagnosis not present

## 2016-08-17 DIAGNOSIS — R296 Repeated falls: Secondary | ICD-10-CM | POA: Diagnosis not present

## 2016-08-17 DIAGNOSIS — G35 Multiple sclerosis: Secondary | ICD-10-CM | POA: Diagnosis not present

## 2016-08-17 LAB — BASIC METABOLIC PANEL
BUN: 26 mg/dL — AB (ref 4–21)
Creatinine: 0.7 mg/dL (ref 0.6–1.3)
GLUCOSE: 89 mg/dL
Potassium: 4 mmol/L (ref 3.4–5.3)
Sodium: 142 mmol/L (ref 137–147)

## 2016-08-17 LAB — LIPID PANEL
CHOLESTEROL: 193 mg/dL (ref 0–200)
HDL: 46 mg/dL (ref 35–70)
LDL CALC: 133 mg/dL
Triglycerides: 86 mg/dL (ref 40–160)

## 2016-08-18 DIAGNOSIS — F0391 Unspecified dementia with behavioral disturbance: Secondary | ICD-10-CM | POA: Diagnosis not present

## 2016-08-18 DIAGNOSIS — R296 Repeated falls: Secondary | ICD-10-CM | POA: Diagnosis not present

## 2016-08-18 DIAGNOSIS — G3281 Cerebellar ataxia in diseases classified elsewhere: Secondary | ICD-10-CM | POA: Diagnosis not present

## 2016-08-18 DIAGNOSIS — R2689 Other abnormalities of gait and mobility: Secondary | ICD-10-CM | POA: Diagnosis not present

## 2016-08-18 DIAGNOSIS — R488 Other symbolic dysfunctions: Secondary | ICD-10-CM | POA: Diagnosis not present

## 2016-08-18 DIAGNOSIS — M6259 Muscle wasting and atrophy, not elsewhere classified, multiple sites: Secondary | ICD-10-CM | POA: Diagnosis not present

## 2016-08-23 DIAGNOSIS — F0391 Unspecified dementia with behavioral disturbance: Secondary | ICD-10-CM | POA: Diagnosis not present

## 2016-08-23 DIAGNOSIS — R296 Repeated falls: Secondary | ICD-10-CM | POA: Diagnosis not present

## 2016-08-23 DIAGNOSIS — R488 Other symbolic dysfunctions: Secondary | ICD-10-CM | POA: Diagnosis not present

## 2016-08-23 DIAGNOSIS — G35 Multiple sclerosis: Secondary | ICD-10-CM | POA: Diagnosis not present

## 2016-08-23 DIAGNOSIS — M6259 Muscle wasting and atrophy, not elsewhere classified, multiple sites: Secondary | ICD-10-CM | POA: Diagnosis not present

## 2016-08-23 DIAGNOSIS — G3281 Cerebellar ataxia in diseases classified elsewhere: Secondary | ICD-10-CM | POA: Diagnosis not present

## 2016-08-24 DIAGNOSIS — G35 Multiple sclerosis: Secondary | ICD-10-CM | POA: Diagnosis not present

## 2016-08-24 DIAGNOSIS — M6259 Muscle wasting and atrophy, not elsewhere classified, multiple sites: Secondary | ICD-10-CM | POA: Diagnosis not present

## 2016-08-24 DIAGNOSIS — F0391 Unspecified dementia with behavioral disturbance: Secondary | ICD-10-CM | POA: Diagnosis not present

## 2016-08-24 DIAGNOSIS — R296 Repeated falls: Secondary | ICD-10-CM | POA: Diagnosis not present

## 2016-08-24 DIAGNOSIS — G3281 Cerebellar ataxia in diseases classified elsewhere: Secondary | ICD-10-CM | POA: Diagnosis not present

## 2016-08-24 DIAGNOSIS — R488 Other symbolic dysfunctions: Secondary | ICD-10-CM | POA: Diagnosis not present

## 2016-08-25 ENCOUNTER — Non-Acute Institutional Stay (SKILLED_NURSING_FACILITY): Payer: Medicare Other | Admitting: Adult Health

## 2016-08-25 ENCOUNTER — Encounter: Payer: Self-pay | Admitting: Adult Health

## 2016-08-25 DIAGNOSIS — L219 Seborrheic dermatitis, unspecified: Secondary | ICD-10-CM | POA: Diagnosis not present

## 2016-08-25 DIAGNOSIS — F0281 Dementia in other diseases classified elsewhere with behavioral disturbance: Secondary | ICD-10-CM

## 2016-08-25 DIAGNOSIS — R296 Repeated falls: Secondary | ICD-10-CM | POA: Diagnosis not present

## 2016-08-25 DIAGNOSIS — G3281 Cerebellar ataxia in diseases classified elsewhere: Secondary | ICD-10-CM | POA: Diagnosis not present

## 2016-08-25 DIAGNOSIS — R488 Other symbolic dysfunctions: Secondary | ICD-10-CM | POA: Diagnosis not present

## 2016-08-25 DIAGNOSIS — G35 Multiple sclerosis: Secondary | ICD-10-CM

## 2016-08-25 DIAGNOSIS — E782 Mixed hyperlipidemia: Secondary | ICD-10-CM

## 2016-08-25 DIAGNOSIS — F02818 Dementia in other diseases classified elsewhere, unspecified severity, with other behavioral disturbance: Secondary | ICD-10-CM

## 2016-08-25 DIAGNOSIS — R634 Abnormal weight loss: Secondary | ICD-10-CM | POA: Diagnosis not present

## 2016-08-25 DIAGNOSIS — F0391 Unspecified dementia with behavioral disturbance: Secondary | ICD-10-CM | POA: Diagnosis not present

## 2016-08-25 DIAGNOSIS — W19XXXS Unspecified fall, sequela: Secondary | ICD-10-CM

## 2016-08-25 DIAGNOSIS — K5900 Constipation, unspecified: Secondary | ICD-10-CM | POA: Insufficient documentation

## 2016-08-25 DIAGNOSIS — N319 Neuromuscular dysfunction of bladder, unspecified: Secondary | ICD-10-CM | POA: Diagnosis not present

## 2016-08-25 DIAGNOSIS — K5901 Slow transit constipation: Secondary | ICD-10-CM | POA: Diagnosis not present

## 2016-08-25 DIAGNOSIS — M6259 Muscle wasting and atrophy, not elsewhere classified, multiple sites: Secondary | ICD-10-CM | POA: Diagnosis not present

## 2016-08-25 NOTE — Progress Notes (Signed)
Patient ID: [name], male   DOB: 10/23/1940, 75 y.o.   MRN: [id number]   Location:  [facility] of Service:  SNF (31)[place] Provider:   Cindi Carbon, ANP [place] [phone]   [name], [name], DO  Patient Care Team: [name], DO as PCP - General (Geriatric Medicine) [name], MD as Consulting Physician (Neurology) [name], MD as Consulting Physician (Urology) [name], MD as Consulting Physician (General Surgery) [name], MD as Consulting Physician (Orthopedic Surgery) [name], MD as Consulting Physician (General Surgery) [name], MD as Consulting Physician (Gastroenterology) [name], DO as Consulting Physician (Geriatric Medicine)  Extended Emergency Contact Information Primary Emergency Contact: [name] Address: [address]          [place], [place] [address] [name] of [place] Phone: [phone] Mobile Phone: [phone] Relation: Spouse  Code Status:  DNR Goals of care: Advanced Directive information Advanced Directives 08/25/2016  Does Patient Have a Medical Advance Directive? Yes  Type of Paramedic of [place];Living will;Out of facility DNR (pink MOST or yellow form)  Does patient want to make changes to medical advance directive? -  Copy of [place] in Chart? Yes  Would patient like information on creating a medical advance directive? -  Pre-existing out of facility DNR order (yellow form or pink MOST form) Yellow form placed in chart (order not valid for inpatient use);Pink MOST form placed in chart (order not valid for inpatient use)     Chief Complaint  Patient presents with  . Medical Management of Chronic Issues    HPI:  Pt is a 75 y.o. male seen today for medical management of chronic diseases.   He has MS and underlying dementia. He was started on depakote due to frequent attempts to  get oob and agitation as we had exhausted other non pharmacologic measures.  His behaviors have improved but the staff reports that he is sleepy.  He has been losing weight over the past several months, even prior to the depakote.  Records indicate that he eats 50-100% of his meals.  He is walking very little now and needs additional assistance for all ADL's except feeding. His goals of care are comfort based and his most form indicates no hospitalizations.    He has seborrheic dermatitis that is not responsive to ketoconazole and ciclopirox.   Urology started him on vesicare for frequent urination attempts.  Difficulty to assess if this has helped vs. The depakote He has lost 12 lbs in the past two months.    Past Medical History:  Diagnosis Date  . Adenomatous polyp   . Allergy   . BPH (benign prostatic hyperplasia)   . COPD (chronic obstructive pulmonary disease) ([facility])   . Dementia 2010  . Elevated PSA 07/31/2014  . Foot drop, right 2008  . 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 Generalized weakness 01/12/2013  . GERD (gastroesophageal reflux disease)   . Hyperlipidemia 07/31/2014  . Multiple sclerosis ([facility]) 2008  . Prostatitis, chronic    Past Surgical History:  Procedure Laterality Date  . CATARACT EXTRACTION Left 02/06/12  . CATARACT EXTRACTION Right 2013  . ESOPHAGOGASTRODUODENOSCOPY ENDOSCOPY  04/16/2002   Dr. Earle Gell  . HERNIA REPAIR  1980'2000   3 inguinal hernia repairs on left  . HERNIA REPAIR  06/16/11   right inguinal hernia repair with mesh   . PROSTATE SURGERY  09/29/2008   Dr.  Ottelin     Allergies  Allergen Reactions  . Aricept [Donepezil Hcl] Other (See Comments)    Reaction:  Unknown   . Dimethyl Fumarate Nausea And Vomiting      Medication List       Accurate as of 08/25/16 10:34 AM. Always use your most recent med list.          acetaminophen 500 MG tablet Commonly known as:  TYLENOL Take 500 mg by mouth every 8 (eight) hours as needed. Use for 14  days for penile pain.   aspirin 81 MG chewable tablet Chew 81 mg by mouth daily.   Biotin 5 MG Tabs Take 5 mg by mouth daily.   chlorhexidine 0.12 % solution Commonly known as:  PERIDEX Use as directed 15 mLs in the mouth or throat 2 (two) times daily. Pt is to swish and spit.   cholecalciferol 1000 units tablet Commonly known as:  VITAMIN D Take 2,000 Units by mouth daily.   ciclopirox 0.77 % cream Commonly known as:  LOPROX Apply 1 application topically 2 (two) times daily. Pt applies to face.   divalproex 125 MG capsule Commonly known as:  DEPAKOTE SPRINKLE Take by mouth 2 (two) times daily.   docusate sodium 100 MG capsule Commonly known as:  COLACE Take 100 mg by mouth at bedtime as needed for mild constipation or moderate constipation.   ketoconazole 2 % shampoo Commonly known as:  NIZORAL Apply 1 application topically 2 (two) times a week. Pt uses on Tuesday and Friday.   loperamide 2 MG capsule Commonly known as:  IMODIUM Take 4 mg by mouth as needed for diarrhea or loose stools (at onset of loose stool and 1 tab (2mg ) after each sucessive loose stool).   LORazepam 0.5 MG tablet Commonly known as:  ATIVAN Take 0.5 mg by mouth every 8 (eight) hours as needed for anxiety.   magnesium hydroxide 400 MG/5ML suspension Commonly known as:  MILK OF MAGNESIA Take 15 mLs by mouth daily as needed for mild constipation or moderate constipation.   memantine 10 MG tablet Commonly known as:  NAMENDA Take 10 mg by mouth 2 (two) times daily.   naproxen sodium 220 MG tablet Commonly known as:  ANAPROX Take 220 mg by mouth every 8 (eight) hours as needed (for discomfort).   sertraline 100 MG tablet Commonly known as:  ZOLOFT Take 100 mg by mouth daily.   solifenacin 10 MG tablet Commonly known as:  VESICARE Take 10 mg by mouth daily.       Review of Systems  Constitutional: Positive for activity change and unexpected weight change. Negative for appetite change,  chills, diaphoresis, fatigue and fever.  HENT: Negative for congestion.   Respiratory: Negative for cough, shortness of breath and wheezing.   Cardiovascular: Negative for chest pain, palpitations and leg swelling.  Gastrointestinal: Negative for abdominal distention, constipation and diarrhea.  Genitourinary: Positive for frequency. Negative for difficulty urinating, discharge, dysuria, enuresis, genital sores, hematuria and penile pain.  Musculoskeletal: Positive for gait problem.  Skin: Positive for rash.  Neurological: Positive for weakness.  Psychiatric/Behavioral: Positive for confusion. Negative for agitation and behavioral problems.    Immunization History  Administered Date(s) Administered  . Influenza Inj Mdck Quad Pf 07/07/2016  . Influenza-Unspecified 07/02/2015  . Pneumococcal Conjugate-13 02/10/2015  . Pneumococcal Polysaccharide-23 10/23/2006  . Tdap 04/19/2005   Pertinent  Health Maintenance Due  Topic Date Due  . COLONOSCOPY  01/14/2019  . INFLUENZA VACCINE  Addressed  .  PNA vac Low Risk Adult  Completed   Fall Risk  07/04/2016 02/17/2016 08/23/2015 08/19/2015 08/11/2014  Falls in the past year? Yes No Yes Yes Yes  Number falls in past yr: 2 or more - 1 2 or more 2 or more  Injury with Fall? Yes - Yes Yes -  Risk Factor Category  High Fall Risk - High Fall Risk - -  Risk for fall due to : History of fall(s);Impaired balance/gait - History of fall(s);Impaired balance/gait;Impaired mobility;Medication side effect;Mental status change - -  Follow up Falls evaluation completed - Falls evaluation completed;Education provided;Falls prevention discussed - -   Functional Status Survey:    Vitals:   08/25/16 1028  BP: 122/75  Pulse: 64  Resp: 20  Temp: 98.7 F (37.1 C)  SpO2: 96%  Weight: 166 lb 4.8 oz (75.4 kg)   Wt Readings from Last 3 Encounters:  08/25/16 166 lb 4.8 oz (75.4 kg)  07/28/16 167 lb (75.8 kg)  07/04/16 178 lb (80.7 kg)   Body mass index is  21.94 kg/m. Physical Exam  Constitutional: No distress.  HENT:  Head: Normocephalic and atraumatic.  Cardiovascular: Normal rate and regular rhythm.   No murmur heard. Pulmonary/Chest: Effort normal and breath sounds normal.  Abdominal: Soft. Bowel sounds are normal. He exhibits no distension. There is no tenderness.  Musculoskeletal:  Weakness to right arm and right leg  Neurological: He is alert.  Oriented to self and place  Skin: Rash (scaly dry skin to nasolabial folds , cheeks, ears) noted. He is not diaphoretic.  Psychiatric: He has a normal mood and affect.  Nursing note and vitals reviewed.   Labs reviewed:  Recent Labs  05/09/16 1102  NA 140  K 3.6  BUN 18  CREATININE 0.8    Recent Labs  05/09/16 1102  AST 19  ALT 17  ALKPHOS 73    Recent Labs  05/09/16 1102  WBC 5.8  HGB 13.9  HCT 39*  PLT 231   Lab Results  Component Value Date   TSH 0.484 01/13/2013   Lab Results  Component Value Date   HGBA1C 5.7 (H) 01/13/2013   Lab Results  Component Value Date   CHOL 207 (A) 08/20/2015   HDL 50 08/20/2015   LDLCALC 135 08/20/2015   TRIG 111 08/20/2015   CHOLHDL 3.9 05/13/2014    Significant Diagnostic Results in last 30 days:  No results found.  Assessment/Plan  1. Dementia associated with other underlying disease with behavioral disturbance Slightly sleepy Improved agitation and attempts to get oob Would decrease depakote to 125 mg qhs  2. Multiple sclerosis ([place]) Advanced, minimally ambulatory with weakening on the left Comfort care  3. Neurogenic bladder S/p suprapubic Continue vesicare 10 mg qd per urology  4. Mixed hyperlipidemia Elevated LDL but would not treat given his goals of care and declining status  5. Fall, sequela Continue PT and OT Proper chair position Fall prec with chair alarm  6. Slow transit constipation Stable Colace 100 mg prn  7. Seborrheic dermatitis Unchanged with current regimen Add triamcinolone  0.1% BID for one week to affected areas of face, neck , ears  8. Weight loss Most likely due to poor intake Check TSH Nutritional consult     Family/ staff Communication: discussed with staff/resident  Labs/tests ordered:  TSH

## 2016-08-26 DIAGNOSIS — R634 Abnormal weight loss: Secondary | ICD-10-CM | POA: Diagnosis not present

## 2016-08-30 DIAGNOSIS — R5383 Other fatigue: Secondary | ICD-10-CM | POA: Diagnosis not present

## 2016-08-30 DIAGNOSIS — R5381 Other malaise: Secondary | ICD-10-CM | POA: Diagnosis not present

## 2016-08-30 DIAGNOSIS — G3281 Cerebellar ataxia in diseases classified elsewhere: Secondary | ICD-10-CM | POA: Diagnosis not present

## 2016-08-30 DIAGNOSIS — M6259 Muscle wasting and atrophy, not elsewhere classified, multiple sites: Secondary | ICD-10-CM | POA: Diagnosis not present

## 2016-08-30 DIAGNOSIS — F0391 Unspecified dementia with behavioral disturbance: Secondary | ICD-10-CM | POA: Diagnosis not present

## 2016-08-30 DIAGNOSIS — G35 Multiple sclerosis: Secondary | ICD-10-CM | POA: Diagnosis not present

## 2016-08-30 DIAGNOSIS — R488 Other symbolic dysfunctions: Secondary | ICD-10-CM | POA: Diagnosis not present

## 2016-08-30 DIAGNOSIS — R296 Repeated falls: Secondary | ICD-10-CM | POA: Diagnosis not present

## 2016-08-30 LAB — TSH: TSH: 1.47 u[IU]/mL (ref 0.41–5.90)

## 2016-08-30 LAB — BASIC METABOLIC PANEL
BUN: 18 mg/dL (ref 4–21)
CREATININE: 0.7 mg/dL (ref 0.6–1.3)
GLUCOSE: 80 mg/dL
Potassium: 4.5 mmol/L (ref 3.4–5.3)
SODIUM: 141 mmol/L (ref 137–147)

## 2016-09-01 DIAGNOSIS — M6259 Muscle wasting and atrophy, not elsewhere classified, multiple sites: Secondary | ICD-10-CM | POA: Diagnosis not present

## 2016-09-01 DIAGNOSIS — R488 Other symbolic dysfunctions: Secondary | ICD-10-CM | POA: Diagnosis not present

## 2016-09-01 DIAGNOSIS — R296 Repeated falls: Secondary | ICD-10-CM | POA: Diagnosis not present

## 2016-09-01 DIAGNOSIS — F0391 Unspecified dementia with behavioral disturbance: Secondary | ICD-10-CM | POA: Diagnosis not present

## 2016-09-01 DIAGNOSIS — G35 Multiple sclerosis: Secondary | ICD-10-CM | POA: Diagnosis not present

## 2016-09-01 DIAGNOSIS — G3281 Cerebellar ataxia in diseases classified elsewhere: Secondary | ICD-10-CM | POA: Diagnosis not present

## 2016-09-02 DIAGNOSIS — F0391 Unspecified dementia with behavioral disturbance: Secondary | ICD-10-CM | POA: Diagnosis not present

## 2016-09-02 DIAGNOSIS — R488 Other symbolic dysfunctions: Secondary | ICD-10-CM | POA: Diagnosis not present

## 2016-09-02 DIAGNOSIS — M6259 Muscle wasting and atrophy, not elsewhere classified, multiple sites: Secondary | ICD-10-CM | POA: Diagnosis not present

## 2016-09-02 DIAGNOSIS — R296 Repeated falls: Secondary | ICD-10-CM | POA: Diagnosis not present

## 2016-09-02 DIAGNOSIS — G3281 Cerebellar ataxia in diseases classified elsewhere: Secondary | ICD-10-CM | POA: Diagnosis not present

## 2016-09-02 DIAGNOSIS — G35 Multiple sclerosis: Secondary | ICD-10-CM | POA: Diagnosis not present

## 2016-09-05 DIAGNOSIS — G3281 Cerebellar ataxia in diseases classified elsewhere: Secondary | ICD-10-CM | POA: Diagnosis not present

## 2016-09-05 DIAGNOSIS — F0391 Unspecified dementia with behavioral disturbance: Secondary | ICD-10-CM | POA: Diagnosis not present

## 2016-09-05 DIAGNOSIS — R296 Repeated falls: Secondary | ICD-10-CM | POA: Diagnosis not present

## 2016-09-05 DIAGNOSIS — R488 Other symbolic dysfunctions: Secondary | ICD-10-CM | POA: Diagnosis not present

## 2016-09-05 DIAGNOSIS — G35 Multiple sclerosis: Secondary | ICD-10-CM | POA: Diagnosis not present

## 2016-09-05 DIAGNOSIS — M6259 Muscle wasting and atrophy, not elsewhere classified, multiple sites: Secondary | ICD-10-CM | POA: Diagnosis not present

## 2016-09-06 DIAGNOSIS — M6259 Muscle wasting and atrophy, not elsewhere classified, multiple sites: Secondary | ICD-10-CM | POA: Diagnosis not present

## 2016-09-06 DIAGNOSIS — G35 Multiple sclerosis: Secondary | ICD-10-CM | POA: Diagnosis not present

## 2016-09-06 DIAGNOSIS — R296 Repeated falls: Secondary | ICD-10-CM | POA: Diagnosis not present

## 2016-09-06 DIAGNOSIS — G3281 Cerebellar ataxia in diseases classified elsewhere: Secondary | ICD-10-CM | POA: Diagnosis not present

## 2016-09-06 DIAGNOSIS — F0391 Unspecified dementia with behavioral disturbance: Secondary | ICD-10-CM | POA: Diagnosis not present

## 2016-09-06 DIAGNOSIS — R488 Other symbolic dysfunctions: Secondary | ICD-10-CM | POA: Diagnosis not present

## 2016-09-07 DIAGNOSIS — G3281 Cerebellar ataxia in diseases classified elsewhere: Secondary | ICD-10-CM | POA: Diagnosis not present

## 2016-09-07 DIAGNOSIS — R488 Other symbolic dysfunctions: Secondary | ICD-10-CM | POA: Diagnosis not present

## 2016-09-07 DIAGNOSIS — F0391 Unspecified dementia with behavioral disturbance: Secondary | ICD-10-CM | POA: Diagnosis not present

## 2016-09-07 DIAGNOSIS — G35 Multiple sclerosis: Secondary | ICD-10-CM | POA: Diagnosis not present

## 2016-09-07 DIAGNOSIS — M6259 Muscle wasting and atrophy, not elsewhere classified, multiple sites: Secondary | ICD-10-CM | POA: Diagnosis not present

## 2016-09-07 DIAGNOSIS — R296 Repeated falls: Secondary | ICD-10-CM | POA: Diagnosis not present

## 2016-09-14 DIAGNOSIS — R488 Other symbolic dysfunctions: Secondary | ICD-10-CM | POA: Diagnosis not present

## 2016-09-14 DIAGNOSIS — G35 Multiple sclerosis: Secondary | ICD-10-CM | POA: Diagnosis not present

## 2016-09-14 DIAGNOSIS — F0391 Unspecified dementia with behavioral disturbance: Secondary | ICD-10-CM | POA: Diagnosis not present

## 2016-09-14 DIAGNOSIS — R296 Repeated falls: Secondary | ICD-10-CM | POA: Diagnosis not present

## 2016-09-14 DIAGNOSIS — M6259 Muscle wasting and atrophy, not elsewhere classified, multiple sites: Secondary | ICD-10-CM | POA: Diagnosis not present

## 2016-09-14 DIAGNOSIS — G3281 Cerebellar ataxia in diseases classified elsewhere: Secondary | ICD-10-CM | POA: Diagnosis not present

## 2016-09-16 DIAGNOSIS — G3281 Cerebellar ataxia in diseases classified elsewhere: Secondary | ICD-10-CM | POA: Diagnosis not present

## 2016-09-16 DIAGNOSIS — R488 Other symbolic dysfunctions: Secondary | ICD-10-CM | POA: Diagnosis not present

## 2016-09-16 DIAGNOSIS — G35 Multiple sclerosis: Secondary | ICD-10-CM | POA: Diagnosis not present

## 2016-09-16 DIAGNOSIS — R296 Repeated falls: Secondary | ICD-10-CM | POA: Diagnosis not present

## 2016-09-16 DIAGNOSIS — F0391 Unspecified dementia with behavioral disturbance: Secondary | ICD-10-CM | POA: Diagnosis not present

## 2016-09-16 DIAGNOSIS — M6259 Muscle wasting and atrophy, not elsewhere classified, multiple sites: Secondary | ICD-10-CM | POA: Diagnosis not present

## 2016-09-20 DIAGNOSIS — R296 Repeated falls: Secondary | ICD-10-CM | POA: Diagnosis not present

## 2016-09-20 DIAGNOSIS — G3281 Cerebellar ataxia in diseases classified elsewhere: Secondary | ICD-10-CM | POA: Diagnosis not present

## 2016-09-20 DIAGNOSIS — G35 Multiple sclerosis: Secondary | ICD-10-CM | POA: Diagnosis not present

## 2016-09-20 DIAGNOSIS — M6259 Muscle wasting and atrophy, not elsewhere classified, multiple sites: Secondary | ICD-10-CM | POA: Diagnosis not present

## 2016-09-21 DIAGNOSIS — M6259 Muscle wasting and atrophy, not elsewhere classified, multiple sites: Secondary | ICD-10-CM | POA: Diagnosis not present

## 2016-09-21 DIAGNOSIS — R296 Repeated falls: Secondary | ICD-10-CM | POA: Diagnosis not present

## 2016-09-21 DIAGNOSIS — G35 Multiple sclerosis: Secondary | ICD-10-CM | POA: Diagnosis not present

## 2016-09-21 DIAGNOSIS — G3281 Cerebellar ataxia in diseases classified elsewhere: Secondary | ICD-10-CM | POA: Diagnosis not present

## 2016-09-23 DIAGNOSIS — R296 Repeated falls: Secondary | ICD-10-CM | POA: Diagnosis not present

## 2016-09-23 DIAGNOSIS — G3281 Cerebellar ataxia in diseases classified elsewhere: Secondary | ICD-10-CM | POA: Diagnosis not present

## 2016-09-23 DIAGNOSIS — G35 Multiple sclerosis: Secondary | ICD-10-CM | POA: Diagnosis not present

## 2016-09-23 DIAGNOSIS — M6259 Muscle wasting and atrophy, not elsewhere classified, multiple sites: Secondary | ICD-10-CM | POA: Diagnosis not present

## 2016-09-26 ENCOUNTER — Encounter: Payer: Self-pay | Admitting: Adult Health

## 2016-09-26 ENCOUNTER — Non-Acute Institutional Stay (SKILLED_NURSING_FACILITY): Payer: Medicare Other | Admitting: Adult Health

## 2016-09-26 DIAGNOSIS — L219 Seborrheic dermatitis, unspecified: Secondary | ICD-10-CM | POA: Diagnosis not present

## 2016-09-26 DIAGNOSIS — I519 Heart disease, unspecified: Secondary | ICD-10-CM

## 2016-09-26 DIAGNOSIS — R634 Abnormal weight loss: Secondary | ICD-10-CM

## 2016-09-26 DIAGNOSIS — R350 Frequency of micturition: Secondary | ICD-10-CM | POA: Diagnosis not present

## 2016-09-26 DIAGNOSIS — I5189 Other ill-defined heart diseases: Secondary | ICD-10-CM | POA: Insufficient documentation

## 2016-09-26 DIAGNOSIS — F02818 Dementia in other diseases classified elsewhere, unspecified severity, with other behavioral disturbance: Secondary | ICD-10-CM

## 2016-09-26 DIAGNOSIS — F0281 Dementia in other diseases classified elsewhere with behavioral disturbance: Secondary | ICD-10-CM | POA: Diagnosis not present

## 2016-09-26 DIAGNOSIS — N401 Enlarged prostate with lower urinary tract symptoms: Secondary | ICD-10-CM

## 2016-09-26 NOTE — Progress Notes (Signed)
Patient ID: [name], male   DOB: 11-26-1940, 76 y.o.   MRN: [id number]   Location:  [facility]:  SNF (31) Provider:   Cindi Carbon, ANP [place] [phone]   [name], [name], DO  Patient Care Team: [name], DO as PCP - General (Geriatric Medicine) [name], MD as Consulting Physician (Neurology) [name], MD as Consulting Physician (Urology) [name], MD as Consulting Physician (General Surgery) [name], MD as Consulting Physician (Orthopedic Surgery) [name], MD as Consulting Physician (General Surgery) [name], MD as Consulting Physician (Gastroenterology) [name], DO as Consulting Physician (Geriatric Medicine)  Extended Emergency Contact Information Primary Emergency Contact: [name] Address: [address]          [place], [place] [address] [name] of [place] Phone: [phone] Mobile Phone: [phone] Relation: Spouse  Code Status:  DNR Goals of care: Advanced Directive information Advanced Directives 09/26/2016  Does Patient Have a Medical Advance Directive? Yes  Type of Paramedic of [place];Living will;Out of facility DNR (pink MOST or yellow form)  Does patient want to make changes to medical advance directive? -  Copy of [place] in Chart? Yes  Would patient like information on creating a medical advance directive? -  Pre-existing out of facility DNR order (yellow form or pink MOST form) Yellow form placed in chart (order not valid for inpatient use);Pink MOST form placed in chart (order not valid for inpatient use)     Chief Complaint  Patient presents with  . Medical Management of Chronic Issues    HPI:  Pt is a 76 y.o. male seen today for medical management of chronic diseases.  He has a hx of MS and dementia, residing in skilled care at [facility].    1.  Dementia associated with other underlying disease with behavioral disturbance On namenda for memory loss and zoloft/anxiety for behaviors Off depakote with improved alertness and few behaviors MMSE 22/30 on 02/08/16, poor orientation and recall  2. Benign prostatic hyperplasia with urinary frequency Has a suprapubic cath due to this and neurogenic bladder Continues to feel that he has urinate but overall this has improved Uses vesicare 10 mg qd    3. Loss of weight Continues to lose weight, down 5 lbs in the past month TSH WNL Improved intake per staff  4. Seborrheic dermatitis Improved after a round of triamcinolone Continues on ketoconazole shampoo and ciclopirox cream  5. Diastolic dysfunction Noted on echo as grade 2 diastolic dysfunction in [id number] Previously had edema which subsided, no sob or cp      Past Medical History:  Diagnosis Date  . Adenomatous polyp   . Allergy   . BPH (benign prostatic hyperplasia)   . COPD (chronic obstructive pulmonary disease) ([place])   . Dementia 2010  . Elevated PSA 07/31/2014  . Foot drop, right 2008  . 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 Generalized weakness 01/12/2013  . GERD (gastroesophageal reflux disease)   . Hyperlipidemia 07/31/2014  . Multiple sclerosis ([place]) 2008  . Prostatitis, chronic    Past Surgical History:  Procedure Laterality Date  . CATARACT EXTRACTION Left 02/06/12  . CATARACT EXTRACTION Right 2013  . ESOPHAGOGASTRODUODENOSCOPY ENDOSCOPY  04/16/2002   Dr. Earle Gell  . HERNIA REPAIR  1980'2000   3 inguinal hernia repairs on left  . HERNIA REPAIR  06/16/11   right inguinal hernia repair with mesh   .  PROSTATE SURGERY  09/29/2008   Dr. Karsten Ro     Allergies  Allergen Reactions  . Aricept [Donepezil Hcl] Other (See Comments)    Reaction:  Unknown   . Dimethyl Fumarate Nausea And Vomiting    Allergies as of 09/26/2016      Reactions   Aricept [donepezil Hcl] Other (See Comments)   Reaction:  Unknown    Dimethyl  Fumarate Nausea And Vomiting      Medication List       Accurate as of 09/26/16  2:58 PM. Always use your most recent med list.          acetaminophen 500 MG tablet Commonly known as:  TYLENOL Take 500 mg by mouth every 8 (eight) hours as needed. Use for 14 days for penile pain.   aspirin 81 MG chewable tablet Chew 81 mg by mouth daily.   Biotin 5 MG Tabs Take 5 mg by mouth daily.   chlorhexidine 0.12 % solution Commonly known as:  PERIDEX Use as directed 15 mLs in the mouth or throat 2 (two) times daily. Pt is to swish and spit.   cholecalciferol 1000 units tablet Commonly known as:  VITAMIN D Take 2,000 Units by mouth daily.   ciclopirox 0.77 % cream Commonly known as:  LOPROX Apply 1 application topically 2 (two) times daily. Pt applies to face.   divalproex 125 MG capsule Commonly known as:  DEPAKOTE SPRINKLE Take by mouth 2 (two) times daily.   docusate sodium 100 MG capsule Commonly known as:  COLACE Take 100 mg by mouth at bedtime as needed for mild constipation or moderate constipation.   ketoconazole 2 % shampoo Commonly known as:  NIZORAL Apply 1 application topically 2 (two) times a week. Pt uses on Tuesday and Friday.   loperamide 2 MG capsule Commonly known as:  IMODIUM Take 4 mg by mouth as needed for diarrhea or loose stools (at onset of loose stool and 1 tab (2mg ) after each sucessive loose stool).   LORazepam 0.5 MG tablet Commonly known as:  ATIVAN Take 0.5 mg by mouth every 8 (eight) hours as needed for anxiety.   magnesium hydroxide 400 MG/5ML suspension Commonly known as:  MILK OF MAGNESIA Take 15 mLs by mouth daily as needed for mild constipation or moderate constipation.   memantine 10 MG tablet Commonly known as:  NAMENDA Take 10 mg by mouth 2 (two) times daily.   naproxen sodium 220 MG tablet Commonly known as:  ANAPROX Take 220 mg by mouth every 8 (eight) hours as needed (for discomfort).   sertraline 100 MG tablet Commonly  known as:  ZOLOFT Take 100 mg by mouth daily.   solifenacin 10 MG tablet Commonly known as:  VESICARE Take 10 mg by mouth daily.       Review of Systems  Constitutional: Positive for unexpected weight change. Negative for activity change, appetite change, chills, diaphoresis, fatigue and fever.  HENT: Negative for congestion.   Respiratory: Negative for cough, shortness of breath and wheezing.   Cardiovascular: Negative for chest pain, palpitations and leg swelling.  Gastrointestinal: Negative for abdominal distention, constipation and diarrhea.  Genitourinary: Positive for frequency. Negative for difficulty urinating, discharge, dysuria, enuresis, genital sores, hematuria and penile pain.  Musculoskeletal: Positive for gait problem.  Skin: Positive for rash.  Neurological: Positive for weakness.  Psychiatric/Behavioral: Positive for confusion. Negative for agitation and behavioral problems.    Immunization History  Administered Date(s) Administered  . Influenza Inj Mdck Quad Pf 07/07/2016  .  Influenza-Unspecified 07/02/2015  . Pneumococcal Conjugate-13 02/10/2015  . Pneumococcal Polysaccharide-23 10/23/2006  . Tdap 04/19/2005   Pertinent  Health Maintenance Due  Topic Date Due  . COLONOSCOPY  01/14/2019  . INFLUENZA VACCINE  Addressed  . PNA vac Low Risk Adult  Completed   Fall Risk  07/04/2016 02/17/2016 08/23/2015 08/19/2015 08/11/2014  Falls in the past year? Yes No Yes Yes Yes  Number falls in past yr: 2 or more - 1 2 or more 2 or more  Injury with Fall? Yes - Yes Yes -  Risk Factor Category  High Fall Risk - High Fall Risk - -  Risk for fall due to : History of fall(s);Impaired balance/gait - History of fall(s);Impaired balance/gait;Impaired mobility;Medication side effect;Mental status change - -  Follow up Falls evaluation completed - Falls evaluation completed;Education provided;Falls prevention discussed - -   Functional Status Survey:    Vitals:   09/26/16  1453  BP: 117/70  Pulse: 84  Resp: 18  Temp: 97.8 F (36.6 C)  SpO2: 97%  Weight: 161 lb 11.2 oz (73.3 kg)   Wt Readings from Last 3 Encounters:  09/26/16 161 lb 11.2 oz (73.3 kg)  08/25/16 166 lb 4.8 oz (75.4 kg)  07/28/16 167 lb (75.8 kg)   Body mass index is 21.33 kg/m. Physical Exam  Constitutional: No distress.  HENT:  Head: Normocephalic and atraumatic.  Cardiovascular: Normal rate and regular rhythm.   No murmur heard. Pulmonary/Chest: Effort normal and breath sounds normal.  Abdominal: Soft. Bowel sounds are normal. He exhibits no distension. There is no tenderness.  Musculoskeletal:  Weakness to right arm and right leg  Neurological: He is alert.  Oriented to self, situation, and place not time  Skin: Skin is warm and dry. No rash (scaly dry skin to nasolabial folds , cheeks, ears but no redness, improved) noted. He is not diaphoretic.  Psychiatric: He has a normal mood and affect.  Nursing note and vitals reviewed.   Labs reviewed:  Recent Labs  05/09/16 1102 08/17/16 08/30/16  NA 140 142 141  K 3.6 4.0 4.5  BUN 18 26* 18  CREATININE 0.8 0.7 0.7    Recent Labs  05/09/16 1102  AST 19  ALT 17  ALKPHOS 73    Recent Labs  05/09/16 1102  WBC 5.8  HGB 13.9  HCT 39*  PLT 231   Lab Results  Component Value Date   TSH 1.47 08/30/2016   Lab Results  Component Value Date   HGBA1C 5.7 (H) 01/13/2013   Lab Results  Component Value Date   CHOL 193 08/17/2016   HDL 46 08/17/2016   LDLCALC 133 08/17/2016   TRIG 86 08/17/2016   CHOLHDL 3.9 05/13/2014    Significant Diagnostic Results in last 30 days:  No results found.  Assessment/Plan  1. Dementia associated with other underlying disease with behavioral disturbance Progressive but improved behaviors Continue namenda 10 mg bid as he still is participating in ADLs  2. Benign prostatic hyperplasia with urinary frequency Improved frequent attempts to urinate Continue Vesicare 10 mg  qd  3. Loss of weight ? If this is due to progressive disease state or an underlying issue as it continues even though staff report improved eating patterns in the dining room Continue larger portions w/meals Continue milk shake after meals No additional intervention due to goals of care  4. Seborrheic dermatitis Improved Continue Ciclopirox BID and ketoconazole shampoo twice weekly  5. Diastolic dysfunction Noted but asymptomatic Losing weight as  well      Family/ staff Communication: discussed with staff/resident  Labs/tests ordered: NA

## 2016-09-27 DIAGNOSIS — G35 Multiple sclerosis: Secondary | ICD-10-CM | POA: Diagnosis not present

## 2016-09-27 DIAGNOSIS — R296 Repeated falls: Secondary | ICD-10-CM | POA: Diagnosis not present

## 2016-09-27 DIAGNOSIS — G3281 Cerebellar ataxia in diseases classified elsewhere: Secondary | ICD-10-CM | POA: Diagnosis not present

## 2016-09-27 DIAGNOSIS — M6259 Muscle wasting and atrophy, not elsewhere classified, multiple sites: Secondary | ICD-10-CM | POA: Diagnosis not present

## 2016-09-29 DIAGNOSIS — M6259 Muscle wasting and atrophy, not elsewhere classified, multiple sites: Secondary | ICD-10-CM | POA: Diagnosis not present

## 2016-09-29 DIAGNOSIS — R296 Repeated falls: Secondary | ICD-10-CM | POA: Diagnosis not present

## 2016-09-29 DIAGNOSIS — G35 Multiple sclerosis: Secondary | ICD-10-CM | POA: Diagnosis not present

## 2016-09-29 DIAGNOSIS — G3281 Cerebellar ataxia in diseases classified elsewhere: Secondary | ICD-10-CM | POA: Diagnosis not present

## 2016-10-03 DIAGNOSIS — G3281 Cerebellar ataxia in diseases classified elsewhere: Secondary | ICD-10-CM | POA: Diagnosis not present

## 2016-10-03 DIAGNOSIS — M6259 Muscle wasting and atrophy, not elsewhere classified, multiple sites: Secondary | ICD-10-CM | POA: Diagnosis not present

## 2016-10-03 DIAGNOSIS — R296 Repeated falls: Secondary | ICD-10-CM | POA: Diagnosis not present

## 2016-10-03 DIAGNOSIS — G35 Multiple sclerosis: Secondary | ICD-10-CM | POA: Diagnosis not present

## 2016-10-07 DIAGNOSIS — R296 Repeated falls: Secondary | ICD-10-CM | POA: Diagnosis not present

## 2016-10-07 DIAGNOSIS — M6259 Muscle wasting and atrophy, not elsewhere classified, multiple sites: Secondary | ICD-10-CM | POA: Diagnosis not present

## 2016-10-07 DIAGNOSIS — G35 Multiple sclerosis: Secondary | ICD-10-CM | POA: Diagnosis not present

## 2016-10-07 DIAGNOSIS — G3281 Cerebellar ataxia in diseases classified elsewhere: Secondary | ICD-10-CM | POA: Diagnosis not present

## 2016-10-10 DIAGNOSIS — G35 Multiple sclerosis: Secondary | ICD-10-CM | POA: Diagnosis not present

## 2016-10-10 DIAGNOSIS — G3281 Cerebellar ataxia in diseases classified elsewhere: Secondary | ICD-10-CM | POA: Diagnosis not present

## 2016-10-10 DIAGNOSIS — R296 Repeated falls: Secondary | ICD-10-CM | POA: Diagnosis not present

## 2016-10-10 DIAGNOSIS — M6259 Muscle wasting and atrophy, not elsewhere classified, multiple sites: Secondary | ICD-10-CM | POA: Diagnosis not present

## 2016-10-11 ENCOUNTER — Encounter: Payer: Self-pay | Admitting: Internal Medicine

## 2016-10-11 ENCOUNTER — Non-Acute Institutional Stay (SKILLED_NURSING_FACILITY): Payer: Medicare Other | Admitting: Internal Medicine

## 2016-10-11 DIAGNOSIS — R8299 Other abnormal findings in urine: Secondary | ICD-10-CM | POA: Diagnosis not present

## 2016-10-11 DIAGNOSIS — N390 Urinary tract infection, site not specified: Secondary | ICD-10-CM | POA: Diagnosis not present

## 2016-10-11 DIAGNOSIS — N319 Neuromuscular dysfunction of bladder, unspecified: Secondary | ICD-10-CM | POA: Diagnosis not present

## 2016-10-11 DIAGNOSIS — R410 Disorientation, unspecified: Secondary | ICD-10-CM | POA: Diagnosis not present

## 2016-10-11 DIAGNOSIS — G35 Multiple sclerosis: Secondary | ICD-10-CM

## 2016-10-11 DIAGNOSIS — Z79899 Other long term (current) drug therapy: Secondary | ICD-10-CM | POA: Diagnosis not present

## 2016-10-11 DIAGNOSIS — Z9359 Other cystostomy status: Secondary | ICD-10-CM

## 2016-10-11 DIAGNOSIS — F02818 Dementia in other diseases classified elsewhere, unspecified severity, with other behavioral disturbance: Secondary | ICD-10-CM

## 2016-10-11 DIAGNOSIS — R319 Hematuria, unspecified: Secondary | ICD-10-CM | POA: Diagnosis not present

## 2016-10-11 DIAGNOSIS — R6889 Other general symptoms and signs: Secondary | ICD-10-CM | POA: Diagnosis not present

## 2016-10-11 DIAGNOSIS — M6281 Muscle weakness (generalized): Secondary | ICD-10-CM | POA: Diagnosis not present

## 2016-10-11 DIAGNOSIS — F0281 Dementia in other diseases classified elsewhere with behavioral disturbance: Secondary | ICD-10-CM

## 2016-10-11 DIAGNOSIS — R531 Weakness: Secondary | ICD-10-CM | POA: Diagnosis not present

## 2016-10-11 DIAGNOSIS — R5383 Other fatigue: Secondary | ICD-10-CM | POA: Diagnosis not present

## 2016-10-11 LAB — CBC AND DIFFERENTIAL
HEMATOCRIT: 40 % — AB (ref 41–53)
HEMOGLOBIN: 13 g/dL — AB (ref 13.5–17.5)
PLATELETS: 165 10*3/uL (ref 150–399)
WBC: 18.1 10*3/mL

## 2016-10-11 LAB — BASIC METABOLIC PANEL
BUN: 24 mg/dL — AB (ref 4–21)
Creatinine: 0.8 mg/dL (ref 0.6–1.3)
Glucose: 127 mg/dL
Potassium: 3.7 mmol/L (ref 3.4–5.3)
SODIUM: 138 mmol/L (ref 137–147)

## 2016-10-11 NOTE — Progress Notes (Signed)
Patient ID: [name], male   DOB: February 19, 1941, 76 y.o.   MRN: [id number]  Location:  [facility] Room Number: [id number] Place of Service:  SNF (31) Provider:  Donnie Panik L. Mariea Clonts, D.O., C.M.D.  Hollace Kinnier, DO  Patient Care Team: [name], DO as PCP - General (Geriatric Medicine) [name], MD as Consulting Physician (Neurology) [name], MD as Consulting Physician (Urology) [name], MD as Consulting Physician (General Surgery) [name], MD as Consulting Physician (Orthopedic Surgery) [name], MD as Consulting Physician (General Surgery) [name], MD as Consulting Physician (Gastroenterology) [name], DO as Consulting Physician (Geriatric Medicine)  Extended Emergency Contact Information Primary Emergency Contact: [name] Address: [address]          [place], [place] [address] [name] of [place] [employer] Phone: [phone] Mobile Phone: [phone] Relation: Spouse  Code Status:  DNR Goals of care: Advanced Directive information Advanced Directives 10/11/2016  Does Patient Have a Medical Advance Directive? Yes  Type of Advance Directive Out of facility DNR (pink MOST or yellow form);Living will;Healthcare Power of Attorney  Does patient want to make changes to medical advance directive? -  Copy of [place] in Chart? Yes  Would patient like information on creating a medical advance directive? -  Pre-existing out of facility DNR order (yellow form or pink MOST form) Yellow form placed in chart (order not valid for inpatient use);Pink MOST form placed in chart (order not valid for inpatient use)     Chief Complaint  Patient presents with  . Acute Visit    HPI:  Pt is a 76 y.o. male seen today for an acute visit for weakness, decreased mentation, positive urine dipstick for wbc, nitrates. Mr. 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  This am, he's been more confused than usual and has recently become verbally unresponsive to voice and even sternal rub, but then is able to follow some commands to move his left leg and both arms, squeeze my fingers.  He would not open his eyes for me.  Of note, he also took a fall last night w/o injury.  He also only slept 2 hours last night.  He had no complaints to help localize the cause of his delirium (outside of the dipstick).   Past Medical History:  Diagnosis Date  . Adenomatous polyp   . Allergy   . BPH (benign prostatic hyperplasia)   . COPD (chronic obstructive pulmonary disease) ([place])   . Dementia 2010  . Elevated PSA 07/31/2014  . Foot drop, right 2008  . 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 Generalized weakness 01/12/2013  . GERD (gastroesophageal reflux disease)   . Hyperlipidemia 07/31/2014  . Multiple sclerosis ([place]) 2008  . Prostatitis, chronic    Past Surgical History:  Procedure Laterality Date  . CATARACT EXTRACTION Left 02/06/12  . CATARACT EXTRACTION Right 2013  . ESOPHAGOGASTRODUODENOSCOPY ENDOSCOPY  04/16/2002   Dr. Earle Gell  . HERNIA REPAIR  1980'2000   3 inguinal hernia repairs on left  . HERNIA REPAIR  06/16/11   right inguinal hernia repair with mesh   . PROSTATE SURGERY  09/29/2008   Dr. Karsten Ro     Allergies  Allergen Reactions  . Aricept [Donepezil Hcl] Other (See Comments)    Reaction:  Unknown   . Dimethyl Fumarate Nausea And Vomiting  Allergies as of 10/11/2016      Reactions   Aricept [donepezil Hcl] Other (See Comments)   Reaction:  Unknown    Dimethyl Fumarate Nausea And Vomiting      Medication List       Accurate as of 10/11/16  1:39 PM. Always use your most recent med list.          acetaminophen 500 MG tablet Commonly known as:  TYLENOL Take 500 mg by mouth every 8 (eight) hours as needed. Use for 14 days for penile pain.   aspirin 81 MG chewable  tablet Chew 81 mg by mouth daily.   Biotin 5 MG Tabs Take 5 mg by mouth daily.   chlorhexidine 0.12 % solution Commonly known as:  PERIDEX Use as directed 15 mLs in the mouth or throat 2 (two) times daily. Pt is to swish and spit.   cholecalciferol 1000 units tablet Commonly known as:  VITAMIN D Take 2,000 Units by mouth daily.   ciclopirox 0.77 % cream Commonly known as:  LOPROX Apply 1 application topically 2 (two) times daily. Pt applies to face.   docusate sodium 100 MG capsule Commonly known as:  COLACE Take 100 mg by mouth at bedtime as needed for mild constipation or moderate constipation.   ketoconazole 2 % shampoo Commonly known as:  NIZORAL Apply 1 application topically 2 (two) times a week. Pt uses on Tuesday and Friday.   loperamide 2 MG capsule Commonly known as:  IMODIUM Take 4 mg by mouth as needed for diarrhea or loose stools (at onset of loose stool and 1 tab (2mg ) after each sucessive loose stool).   LORazepam 0.5 MG tablet Commonly known as:  ATIVAN Take 0.5 mg by mouth every 8 (eight) hours as needed for anxiety.   magnesium hydroxide 400 MG/5ML suspension Commonly known as:  MILK OF MAGNESIA Take 15 mLs by mouth daily as needed for mild constipation or moderate constipation.   memantine 10 MG tablet Commonly known as:  NAMENDA Take 10 mg by mouth 2 (two) times daily.   naproxen sodium 220 MG tablet Commonly known as:  ANAPROX Take 220 mg by mouth every 8 (eight) hours as needed (for discomfort).   sertraline 100 MG tablet Commonly known as:  ZOLOFT Take 100 mg by mouth daily.   solifenacin 10 MG tablet Commonly known as:  VESICARE Take 10 mg by mouth daily.       Review of Systems  Constitutional: Positive for activity change. Negative for appetite change, chills and fever.  Eyes: Negative for visual disturbance.  Respiratory: Negative for shortness of breath.   Cardiovascular: Negative for chest pain and leg swelling.    Gastrointestinal: Negative for abdominal pain.  Genitourinary: Negative for decreased urine volume.       Suprapubic catheter in place  Musculoskeletal: Positive for gait problem. Negative for arthralgias.  Skin: Positive for pallor.    Immunization History  Administered Date(s) Administered  . Influenza Inj Mdck Quad Pf 07/07/2016  . Influenza-Unspecified 07/02/2015  . Pneumococcal Conjugate-13 02/10/2015  . Pneumococcal Polysaccharide-23 10/23/2006  . Tdap 04/19/2005   Pertinent  Health Maintenance Due  Topic Date Due  . COLONOSCOPY  01/14/2019  . INFLUENZA VACCINE  Addressed  . PNA vac Low Risk Adult  Completed   Fall Risk  07/04/2016 02/17/2016 08/23/2015 08/19/2015 08/11/2014  Falls in the past year? Yes No Yes Yes Yes  Number falls in past yr: 2 or more - 1 2 or more 2 or  more  Injury with Fall? Yes - Yes Yes -  Risk Factor Category  High Fall Risk - High Fall Risk - -  Risk for fall due to : History of fall(s);Impaired balance/gait - History of fall(s);Impaired balance/gait;Impaired mobility;Medication side effect;Mental status change - -  Follow up Falls evaluation completed - Falls evaluation completed;Education provided;Falls prevention discussed - -   Functional Status Survey:    Vitals:   10/11/16 1334  BP: 108/64  Pulse: 86  Resp: 20  Temp: 97.5 F (36.4 C)  TempSrc: Oral  SpO2: 92%   There is no height or weight on file to calculate BMI. Physical Exam  Constitutional: No distress.  Cardiovascular: Normal rate, regular rhythm, normal heart sounds and intact distal pulses.   Pulmonary/Chest: Effort normal and breath sounds normal. No respiratory distress. He has no wheezes. He has no rales.  Abdominal: Soft. Bowel sounds are normal. He exhibits no distension. There is no tenderness.  Musculoskeletal: He exhibits no edema or tenderness.  Right LE foot drop chronic  Neurological: He exhibits abnormal muscle tone. Coordination abnormal.  Not speaking, did  follow commands as in hpi  Skin: Skin is warm and dry.    Labs reviewed:  Recent Labs  05/09/16 1102 08/17/16 08/30/16  NA 140 142 141  K 3.6 4.0 4.5  BUN 18 26* 18  CREATININE 0.8 0.7 0.7    Recent Labs  05/09/16 1102  AST 19  ALT 17  ALKPHOS 73    Recent Labs  05/09/16 1102  WBC 5.8  HGB 13.9  HCT 39*  PLT 231   Lab Results  Component Value Date   TSH 1.47 08/30/2016   Lab Results  Component Value Date   HGBA1C 5.7 (H) 01/13/2013   Lab Results  Component Value Date   CHOL 193 08/17/2016   HDL 46 08/17/2016   LDLCALC 133 08/17/2016   TRIG 86 08/17/2016   CHOLHDL 3.9 05/13/2014    Assessment/Plan 1. Acute delirium -cbc, cmp, CXR, UA c+s -NS at 70cc/hr x 1L, then maintain saline lock until reassessed or check with DO/NP -VS q shift WA x 72 hr -decided to begin on cipro due to positive UA and severe symptoms--pending culture  2. Dementia associated with other underlying disease with behavioral disturbance -has been progressive lately, no med changes at present -avoid ativan as much as possible (none given lately to explain lethargy)  3. Multiple sclerosis (HCC) -cause of dementia and right foot drop -no recent neuro visits b/c his wife reported nothing more could be done  4. Neurogenic bladder -ongoing, leading to more infections  5. Suprapubic catheter ([place]) -cont catheter, tx suspected UTI as cause of delirium -keep urology f/u in May  Family/ staff Communication: discussed with SNF nurse, nurse manager notified his wife of plans/condition  Labs/tests ordered:  See #1

## 2016-10-12 DIAGNOSIS — M6259 Muscle wasting and atrophy, not elsewhere classified, multiple sites: Secondary | ICD-10-CM | POA: Diagnosis not present

## 2016-10-12 DIAGNOSIS — R296 Repeated falls: Secondary | ICD-10-CM | POA: Diagnosis not present

## 2016-10-12 DIAGNOSIS — G3281 Cerebellar ataxia in diseases classified elsewhere: Secondary | ICD-10-CM | POA: Diagnosis not present

## 2016-10-12 DIAGNOSIS — G35 Multiple sclerosis: Secondary | ICD-10-CM | POA: Diagnosis not present

## 2016-10-14 DIAGNOSIS — R296 Repeated falls: Secondary | ICD-10-CM | POA: Diagnosis not present

## 2016-10-14 DIAGNOSIS — G3281 Cerebellar ataxia in diseases classified elsewhere: Secondary | ICD-10-CM | POA: Diagnosis not present

## 2016-10-14 DIAGNOSIS — G35 Multiple sclerosis: Secondary | ICD-10-CM | POA: Diagnosis not present

## 2016-10-14 DIAGNOSIS — M6259 Muscle wasting and atrophy, not elsewhere classified, multiple sites: Secondary | ICD-10-CM | POA: Diagnosis not present

## 2016-10-15 DIAGNOSIS — M25551 Pain in right hip: Secondary | ICD-10-CM | POA: Diagnosis not present

## 2016-10-18 DIAGNOSIS — M6259 Muscle wasting and atrophy, not elsewhere classified, multiple sites: Secondary | ICD-10-CM | POA: Diagnosis not present

## 2016-10-18 DIAGNOSIS — R296 Repeated falls: Secondary | ICD-10-CM | POA: Diagnosis not present

## 2016-10-18 DIAGNOSIS — G3281 Cerebellar ataxia in diseases classified elsewhere: Secondary | ICD-10-CM | POA: Diagnosis not present

## 2016-10-18 DIAGNOSIS — G35 Multiple sclerosis: Secondary | ICD-10-CM | POA: Diagnosis not present

## 2016-10-19 DIAGNOSIS — M6259 Muscle wasting and atrophy, not elsewhere classified, multiple sites: Secondary | ICD-10-CM | POA: Diagnosis not present

## 2016-10-19 DIAGNOSIS — G35 Multiple sclerosis: Secondary | ICD-10-CM | POA: Diagnosis not present

## 2016-10-19 DIAGNOSIS — R296 Repeated falls: Secondary | ICD-10-CM | POA: Diagnosis not present

## 2016-10-19 DIAGNOSIS — G3281 Cerebellar ataxia in diseases classified elsewhere: Secondary | ICD-10-CM | POA: Diagnosis not present

## 2016-10-20 DIAGNOSIS — M6259 Muscle wasting and atrophy, not elsewhere classified, multiple sites: Secondary | ICD-10-CM | POA: Diagnosis not present

## 2016-10-20 DIAGNOSIS — G3281 Cerebellar ataxia in diseases classified elsewhere: Secondary | ICD-10-CM | POA: Diagnosis not present

## 2016-10-20 DIAGNOSIS — R296 Repeated falls: Secondary | ICD-10-CM | POA: Diagnosis not present

## 2016-10-20 DIAGNOSIS — G35 Multiple sclerosis: Secondary | ICD-10-CM | POA: Diagnosis not present

## 2016-10-24 ENCOUNTER — Encounter: Payer: Self-pay | Admitting: Adult Health

## 2016-10-24 ENCOUNTER — Non-Acute Institutional Stay (SKILLED_NURSING_FACILITY): Payer: Medicare Other | Admitting: Adult Health

## 2016-10-24 DIAGNOSIS — N319 Neuromuscular dysfunction of bladder, unspecified: Secondary | ICD-10-CM | POA: Diagnosis not present

## 2016-10-24 DIAGNOSIS — E559 Vitamin D deficiency, unspecified: Secondary | ICD-10-CM

## 2016-10-24 DIAGNOSIS — R269 Unspecified abnormalities of gait and mobility: Secondary | ICD-10-CM

## 2016-10-24 DIAGNOSIS — F0281 Dementia in other diseases classified elsewhere with behavioral disturbance: Secondary | ICD-10-CM

## 2016-10-24 DIAGNOSIS — Z23 Encounter for immunization: Secondary | ICD-10-CM | POA: Diagnosis not present

## 2016-10-24 DIAGNOSIS — F02818 Dementia in other diseases classified elsewhere, unspecified severity, with other behavioral disturbance: Secondary | ICD-10-CM

## 2016-10-24 DIAGNOSIS — R634 Abnormal weight loss: Secondary | ICD-10-CM

## 2016-10-24 DIAGNOSIS — K5901 Slow transit constipation: Secondary | ICD-10-CM | POA: Diagnosis not present

## 2016-10-24 NOTE — Progress Notes (Signed)
Patient ID: [name], male   DOB: [birth year], 76 y.o.   MRN: [id number]   Location:  [facility]:  SNF (31) Provider:   Cindi Carbon, ANP [place] [phone]   [name], [name], DO  Patient Care Team: [name], DO as PCP - General (Geriatric Medicine) [name], MD as Consulting Physician (Neurology) [name], MD as Consulting Physician (Urology) [name], MD as Consulting Physician (General Surgery) [name], MD as Consulting Physician (Orthopedic Surgery) [name], MD as Consulting Physician (General Surgery) [name], MD as Consulting Physician (Gastroenterology) [name], DO as Consulting Physician (Geriatric Medicine)  Extended Emergency Contact Information Primary Emergency Contact: [name] Address: [address]          [place], [place] [address] [name] of [place] Phone: [phone] Mobile Phone: [phone] Relation: Spouse  Code Status:  DNR Goals of care: Advanced Directive information Advanced Directives 10/24/2016  Does Patient Have a Medical Advance Directive? Yes  Type of Advance Directive Out of facility DNR (pink MOST or yellow form);Living will;Healthcare Power of Attorney  Does patient want to make changes to medical advance directive? -  Copy of [place] in Chart? Yes  Would patient like information on creating a medical advance directive? -  Pre-existing out of facility DNR order (yellow form or pink MOST form) Yellow form placed in chart (order not valid for inpatient use);Pink MOST form placed in chart (order not valid for inpatient use)     Chief Complaint  Patient presents with  . Medical Management of Chronic Issues    HPI:  Pt is a 76 y.o. male seen today for medical management of chronic diseases.  He has a hx of MS and dementia, residing in skilled care at [facility].   He was  treated for a UTI with cipro on 10/11/16.  Urine grew >100,000 colonies of pseudomonas and sensitive enterococcus.  CXR 10/11/16 showed no active CP dz. Staff reports indicate that his behavior has overall improved although he still has periods of agitation. One dose of ativan has been used in the past 14 days. He has progressive MS and is ambulating less over time due to right leg weakness. He wears a brace to the right leg and uses a walker when ambulating but needs assistance due to falls. On Namenda for dementia, last MMSE 22/30 02/08/16.   He has been losing weight for unclear reasons. Down 13 lbs since Oct of of 2017.  He currently is on boost milkshakes and eats fairly well per staff. TSH WNL. His family does not want aggressive care and would not be interested in working this issue up as he has no other symptoms.  He has a suprapubic cath due to neurogenic bladder and occasional reports bladder pain. This has been an ongoing symptom but it is much better over the past few months. He is on vesicare for this issue and uses    Past Medical History:  Diagnosis Date  . Adenomatous polyp   . Allergy   . BPH (benign prostatic hyperplasia)   . COPD (chronic obstructive pulmonary disease) ([place])   . Dementia 2010  . Elevated PSA 07/31/2014  . Foot drop, right 2008  . 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 Generalized weakness 01/12/2013  . GERD (gastroesophageal reflux disease)   . Hyperlipidemia 07/31/2014  . Multiple sclerosis ([place]) 2008  . Prostatitis, chronic  Past Surgical History:  Procedure Laterality Date  . CATARACT EXTRACTION Left 02/06/12  . CATARACT EXTRACTION Right 2013  . ESOPHAGOGASTRODUODENOSCOPY ENDOSCOPY  04/16/2002   Dr. Earle Gell  . HERNIA REPAIR  1980'2000   3 inguinal hernia repairs on left  . HERNIA REPAIR  06/16/11   right inguinal hernia repair with mesh   . PROSTATE SURGERY  09/29/2008   Dr. Karsten Ro     Allergies  Allergen Reactions  . Aricept [Donepezil Hcl] Other  (See Comments)    Reaction:  Unknown   . Dimethyl Fumarate Nausea And Vomiting    Allergies as of 10/24/2016      Reactions   Aricept [donepezil Hcl] Other (See Comments)   Reaction:  Unknown    Dimethyl Fumarate Nausea And Vomiting      Medication List       Accurate as of 10/24/16  4:41 PM. Always use your most recent med list.          aspirin 81 MG chewable tablet Chew 81 mg by mouth daily.   Biotin 5 MG Tabs Take 5 mg by mouth daily.   cholecalciferol 1000 units tablet Commonly known as:  VITAMIN D Take 2,000 Units by mouth daily.   ciclopirox 0.77 % cream Commonly known as:  LOPROX Apply 1 application topically 2 (two) times daily. Pt applies to face.   docusate sodium 100 MG capsule Commonly known as:  COLACE Take 100 mg by mouth at bedtime as needed for mild constipation or moderate constipation.   ketoconazole 2 % shampoo Commonly known as:  NIZORAL Apply 1 application topically 2 (two) times a week. Pt uses on Tuesday and Friday.   loperamide 2 MG capsule Commonly known as:  IMODIUM Take 4 mg by mouth as needed for diarrhea or loose stools (at onset of loose stool and 1 tab (2mg ) after each sucessive loose stool).   LORazepam 0.5 MG tablet Commonly known as:  ATIVAN Take 0.5 mg by mouth every 8 (eight) hours as needed for anxiety.   magnesium hydroxide 400 MG/5ML suspension Commonly known as:  MILK OF MAGNESIA Take 15 mLs by mouth daily as needed for mild constipation or moderate constipation.   memantine 10 MG tablet Commonly known as:  NAMENDA Take 10 mg by mouth 2 (two) times daily.   naproxen sodium 220 MG tablet Commonly known as:  ANAPROX Take 220 mg by mouth every 8 (eight) hours as needed (for discomfort).   sertraline 100 MG tablet Commonly known as:  ZOLOFT Take 100 mg by mouth daily.   solifenacin 10 MG tablet Commonly known as:  VESICARE Take 10 mg by mouth daily.       Review of Systems  Constitutional: Positive for  unexpected weight change. Negative for activity change, appetite change, chills, diaphoresis, fatigue and fever.  HENT: Negative for congestion.   Respiratory: Negative for cough, shortness of breath and wheezing.   Cardiovascular: Negative for chest pain, palpitations and leg swelling.  Gastrointestinal: Negative for abdominal distention, constipation and diarrhea.  Genitourinary: Positive for frequency. Negative for difficulty urinating, discharge, dysuria, enuresis, genital sores, hematuria and penile pain.  Musculoskeletal: Positive for gait problem.  Skin: Positive for rash.  Neurological: Positive for weakness.  Psychiatric/Behavioral: Positive for confusion. Negative for agitation and behavioral problems.    Immunization History  Administered Date(s) Administered  . Influenza Inj Mdck Quad Pf 07/07/2016  . Influenza-Unspecified 07/02/2015  . Pneumococcal Conjugate-13 02/10/2015  . Pneumococcal Polysaccharide-23 10/23/2006  . Tdap 04/19/2005  Pertinent  Health Maintenance Due  Topic Date Due  . COLONOSCOPY  01/14/2019  . INFLUENZA VACCINE  Addressed  . PNA vac Low Risk Adult  Completed   Fall Risk  07/04/2016 02/17/2016 08/23/2015 08/19/2015 08/11/2014  Falls in the past year? Yes No Yes Yes Yes  Number falls in past yr: 2 or more - 1 2 or more 2 or more  Injury with Fall? Yes - Yes Yes -  Risk Factor Category  High Fall Risk - High Fall Risk - -  Risk for fall due to : History of fall(s);Impaired balance/gait - History of fall(s);Impaired balance/gait;Impaired mobility;Medication side effect;Mental status change - -  Follow up Falls evaluation completed - Falls evaluation completed;Education provided;Falls prevention discussed - -   Functional Status Survey:    Vitals:   10/24/16 1609  BP: 117/71  Pulse: 75  Resp: 18  Temp: 97.8 F (36.6 C)  SpO2: 98%  Weight: 160 lb 6.4 oz (72.8 kg)   Wt Readings from Last 3 Encounters:  10/24/16 160 lb 6.4 oz (72.8 kg)    09/26/16 161 lb 11.2 oz (73.3 kg)  08/25/16 166 lb 4.8 oz (75.4 kg)   Body mass index is 21.16 kg/m. Physical Exam  Constitutional: No distress.  HENT:  Head: Normocephalic and atraumatic.  Cardiovascular: Normal rate and regular rhythm.   No murmur heard. Pulmonary/Chest: Effort normal and breath sounds normal.  Abdominal: Soft. Bowel sounds are normal. He exhibits no distension. There is no tenderness.  Musculoskeletal:  Weakness to right arm and right leg  Neurological: He is alert.  Oriented to self, situation, and place not time  Skin: Skin is warm and dry. No rash noted. He is not diaphoretic.  Psychiatric: He has a normal mood and affect.  Nursing note and vitals reviewed.   Labs reviewed:  Recent Labs  05/09/16 1102 08/17/16 08/30/16  NA 140 142 141  K 3.6 4.0 4.5  BUN 18 26* 18  CREATININE 0.8 0.7 0.7    Recent Labs  05/09/16 1102  AST 19  ALT 17  ALKPHOS 73    Recent Labs  05/09/16 1102  WBC 5.8  HGB 13.9  HCT 39*  PLT 231   Lab Results  Component Value Date   TSH 1.47 08/30/2016   Lab Results  Component Value Date   HGBA1C 5.7 (H) 01/13/2013   Lab Results  Component Value Date   CHOL 193 08/17/2016   HDL 46 08/17/2016   LDLCALC 133 08/17/2016   TRIG 86 08/17/2016   CHOLHDL 3.9 05/13/2014    Significant Diagnostic Results in last 30 days:  No results found.  Assessment/Plan  1. Loss of weight Noted, has some muscle wasting which may contribute to this problem Appetite is good so no need for appetite stimulant His goals of care are comfort based with no aggressive treatment, would not look into this further He is not anemic, has normal CXR, TSH WNL, no s/s to address  2. Neurogenic bladder Has suprapubic Continue vesicare Followed by Urology  3. Dementia associated with other underlying disease with behavioral disturbance Progressive but improved behaviors over time Continue prn ativan and namenda 10 mg BID Continue  zoloft 100 mg qd, would not taper due to risk of relapse in behaviors  4. Slow transit constipation Denies any issues Continue prn colace  5. 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 Vitamin D deficiency Continue 2000 units qd  7. Immunization due  Tdap given IM x 1 dose  Family/ staff Communication: discussed with staff/resident  Labs/tests ordered: NA

## 2016-10-25 DIAGNOSIS — G3281 Cerebellar ataxia in diseases classified elsewhere: Secondary | ICD-10-CM | POA: Diagnosis not present

## 2016-10-25 DIAGNOSIS — G35 Multiple sclerosis: Secondary | ICD-10-CM | POA: Diagnosis not present

## 2016-10-25 DIAGNOSIS — R296 Repeated falls: Secondary | ICD-10-CM | POA: Diagnosis not present

## 2016-10-25 DIAGNOSIS — M6259 Muscle wasting and atrophy, not elsewhere classified, multiple sites: Secondary | ICD-10-CM | POA: Diagnosis not present

## 2016-10-26 DIAGNOSIS — G3281 Cerebellar ataxia in diseases classified elsewhere: Secondary | ICD-10-CM | POA: Diagnosis not present

## 2016-10-26 DIAGNOSIS — M6259 Muscle wasting and atrophy, not elsewhere classified, multiple sites: Secondary | ICD-10-CM | POA: Diagnosis not present

## 2016-10-26 DIAGNOSIS — Z961 Presence of intraocular lens: Secondary | ICD-10-CM | POA: Diagnosis not present

## 2016-10-26 DIAGNOSIS — G35 Multiple sclerosis: Secondary | ICD-10-CM | POA: Diagnosis not present

## 2016-10-26 DIAGNOSIS — R296 Repeated falls: Secondary | ICD-10-CM | POA: Diagnosis not present

## 2016-10-26 DIAGNOSIS — H524 Presbyopia: Secondary | ICD-10-CM | POA: Diagnosis not present

## 2016-10-27 DIAGNOSIS — G35 Multiple sclerosis: Secondary | ICD-10-CM | POA: Diagnosis not present

## 2016-10-27 DIAGNOSIS — M6259 Muscle wasting and atrophy, not elsewhere classified, multiple sites: Secondary | ICD-10-CM | POA: Diagnosis not present

## 2016-10-27 DIAGNOSIS — G3281 Cerebellar ataxia in diseases classified elsewhere: Secondary | ICD-10-CM | POA: Diagnosis not present

## 2016-10-27 DIAGNOSIS — R296 Repeated falls: Secondary | ICD-10-CM | POA: Diagnosis not present

## 2016-11-01 DIAGNOSIS — G35 Multiple sclerosis: Secondary | ICD-10-CM | POA: Diagnosis not present

## 2016-11-01 DIAGNOSIS — R296 Repeated falls: Secondary | ICD-10-CM | POA: Diagnosis not present

## 2016-11-01 DIAGNOSIS — G3281 Cerebellar ataxia in diseases classified elsewhere: Secondary | ICD-10-CM | POA: Diagnosis not present

## 2016-11-01 DIAGNOSIS — M6259 Muscle wasting and atrophy, not elsewhere classified, multiple sites: Secondary | ICD-10-CM | POA: Diagnosis not present

## 2016-11-02 DIAGNOSIS — G3281 Cerebellar ataxia in diseases classified elsewhere: Secondary | ICD-10-CM | POA: Diagnosis not present

## 2016-11-02 DIAGNOSIS — R296 Repeated falls: Secondary | ICD-10-CM | POA: Diagnosis not present

## 2016-11-02 DIAGNOSIS — G35 Multiple sclerosis: Secondary | ICD-10-CM | POA: Diagnosis not present

## 2016-11-02 DIAGNOSIS — M6259 Muscle wasting and atrophy, not elsewhere classified, multiple sites: Secondary | ICD-10-CM | POA: Diagnosis not present

## 2016-11-03 DIAGNOSIS — G3281 Cerebellar ataxia in diseases classified elsewhere: Secondary | ICD-10-CM | POA: Diagnosis not present

## 2016-11-03 DIAGNOSIS — R296 Repeated falls: Secondary | ICD-10-CM | POA: Diagnosis not present

## 2016-11-03 DIAGNOSIS — M6259 Muscle wasting and atrophy, not elsewhere classified, multiple sites: Secondary | ICD-10-CM | POA: Diagnosis not present

## 2016-11-03 DIAGNOSIS — G35 Multiple sclerosis: Secondary | ICD-10-CM | POA: Diagnosis not present

## 2016-11-06 ENCOUNTER — Emergency Department (HOSPITAL_COMMUNITY)
Admission: EM | Admit: 2016-11-06 | Discharge: 2016-11-06 | Disposition: A | Discharge status: Home / Self Care | Payer: Medicare Other | Attending: Emergency Medicine | Admitting: Emergency Medicine

## 2016-11-06 ENCOUNTER — Encounter (HOSPITAL_COMMUNITY): Payer: Self-pay | Admitting: Nurse Practitioner

## 2016-11-06 DIAGNOSIS — T83098A Other mechanical complication of other indwelling urethral catheter, initial encounter: Secondary | ICD-10-CM | POA: Insufficient documentation

## 2016-11-06 DIAGNOSIS — Z4803 Encounter for change or removal of drains: Secondary | ICD-10-CM | POA: Diagnosis not present

## 2016-11-06 DIAGNOSIS — Z87891 Personal history of nicotine dependence: Secondary | ICD-10-CM | POA: Insufficient documentation

## 2016-11-06 DIAGNOSIS — T83010A Breakdown (mechanical) of cystostomy catheter, initial encounter: Secondary | ICD-10-CM

## 2016-11-06 DIAGNOSIS — R531 Weakness: Secondary | ICD-10-CM | POA: Diagnosis not present

## 2016-11-06 DIAGNOSIS — Z79899 Other long term (current) drug therapy: Secondary | ICD-10-CM | POA: Diagnosis not present

## 2016-11-06 DIAGNOSIS — J449 Chronic obstructive pulmonary disease, unspecified: Secondary | ICD-10-CM | POA: Insufficient documentation

## 2016-11-06 DIAGNOSIS — R1030 Lower abdominal pain, unspecified: Secondary | ICD-10-CM | POA: Diagnosis not present

## 2016-11-06 DIAGNOSIS — Y732 Prosthetic and other implants, materials and accessory gastroenterology and urology devices associated with adverse incidents: Secondary | ICD-10-CM | POA: Diagnosis not present

## 2016-11-06 DIAGNOSIS — Z7982 Long term (current) use of aspirin: Secondary | ICD-10-CM | POA: Diagnosis not present

## 2016-11-06 NOTE — ED Notes (Signed)
Bed: WA06 Expected date:  Expected time:  Means of arrival:  Comments: Pulled supra pubic cath out

## 2016-11-06 NOTE — ED Provider Notes (Signed)
[facility] DEPT Provider Note   CSN: [id number] Arrival date & time: 11/06/16  [id number]     History   Chief Complaint No chief complaint on file.   HPI [name] is a 76 y.o. male.  HPI Patient presents emergency department after accidentally pulling out his suprapubic catheter today.  He has multiple sclerosis.  He has a 20 [place] catheter and at baseline.  This was placed in 2017.  No other complaints at this time.  Nursing facility attempted replacement without success.  Said to the ER for replacement of suprapubic catheter.  Patient is otherwise been in his normal state of health per the wife.   Past Medical History:  Diagnosis Date  . Adenomatous polyp   . Allergy   . BPH (benign prostatic hyperplasia)   . COPD (chronic obstructive pulmonary disease) ([place])   . Dementia 2010  . Elevated PSA 07/31/2014  . Foot drop, right 2008  . 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 Generalized weakness 01/12/2013  . GERD (gastroesophageal reflux disease)   . Hyperlipidemia 07/31/2014  . Multiple sclerosis ([place]) 2008  . Prostatitis, chronic     Patient Active Problem List   Diagnosis Date Noted  . Vitamin D deficiency 10/24/2016  . Diastolic dysfunction 0000000  . Constipation 08/25/2016  . Loss of weight 08/25/2016  . Seborrheic dermatitis 07/04/2016  . Dementia with behavioral disturbance 06/20/2016  . Suprapubic catheter ([place]) 06/20/2016  . Falls 05/11/2016  . Diarrhea 09/08/2014  . Hyperlipidemia 07/31/2014  . Elevated PSA 07/31/2014  . Generalized weakness 01/12/2013  . Neurogenic bladder 05/07/2012  . Multiple sclerosis ([place]) 04/20/2011  . BPH (benign prostatic hyperplasia) 04/20/2011  . Adenomatous colon polyp 04/20/2011  . GE reflux 04/20/2011  . Allergic rhinitis 04/20/2011  . 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 CATARACT EXTRACTION Left 02/06/12  . CATARACT EXTRACTION Right 2013  . ESOPHAGOGASTRODUODENOSCOPY  ENDOSCOPY  04/16/2002   Dr. Earle Gell  . HERNIA REPAIR  1980'2000   3 inguinal hernia repairs on left  . HERNIA REPAIR  06/16/11   right inguinal hernia repair with mesh   . PROSTATE SURGERY  09/29/2008   Dr. Karsten Ro        Home Medications    Prior to Admission medications   Medication Sig Start Date End Date Taking? Authorizing Provider  aspirin 81 MG chewable tablet Chew 81 mg by mouth daily.    Historical Provider, MD  Biotin 5 MG TABS Take 5 mg by mouth daily.     Historical Provider, MD  cholecalciferol (VITAMIN D) 1000 units tablet Take 2,000 Units by mouth daily.    Historical Provider, MD  ciclopirox (LOPROX) 0.77 % cream Apply 1 application topically 2 (two) times daily. Pt applies to face.    Historical Provider, MD  docusate sodium (COLACE) 100 MG capsule Take 100 mg by mouth at bedtime as needed for mild constipation or moderate constipation.     Historical Provider, MD  ketoconazole (NIZORAL) 2 % shampoo Apply 1 application topically 2 (two) times a week. Pt uses on Tuesday and Friday.    Historical Provider, MD  loperamide (IMODIUM) 2 MG capsule Take 4 mg by mouth as needed for diarrhea or loose stools (at onset of loose stool and 1 tab (2mg ) after each sucessive loose stool).     Historical Provider, MD  LORazepam (ATIVAN) 0.5 MG tablet Take 0.5 mg by mouth every 8 (eight) hours as needed for anxiety.  Historical Provider, MD  magnesium hydroxide (MILK OF MAGNESIA) 400 MG/5ML suspension Take 15 mLs by mouth daily as needed for mild constipation or moderate constipation.     Historical Provider, MD  memantine (NAMENDA) 10 MG tablet Take 10 mg by mouth 2 (two) times daily.    Historical Provider, MD  naproxen sodium (ANAPROX) 220 MG tablet Take 220 mg by mouth every 8 (eight) hours as needed (for discomfort).     Historical Provider, MD  sertraline (ZOLOFT) 100 MG tablet Take 100 mg by mouth daily.    Historical Provider, MD  solifenacin (VESICARE) 10 MG tablet Take 10  mg by mouth daily.    Historical Provider, MD    Family History Family History  Problem Relation Age of Onset  . Depression Mother   . Dementia Father   . Pneumonia Father     Social History Social History  Substance Use Topics  . Smoking status: Former Smoker    Quit date: 09/20/1975  . Smokeless tobacco: Never Used  . Alcohol use No     Comment: Quit: 2003     Allergies   Aricept [donepezil hcl] and Dimethyl fumarate   Review of Systems Review of Systems  All other systems reviewed and are negative.    Physical Exam Updated Vital Signs BP 126/69 (BP Location: Right Arm)   Pulse 67   Temp 97.6 F (36.4 C) (Oral)   Resp 15   Ht 6\' 1"  (1.854 m)   Wt 160 lb (72.6 kg)   SpO2 99%   BMI 21.11 kg/m   Physical Exam  Constitutional: He is oriented to person, place, and time. He appears well-developed and well-nourished.  HENT:  Head: Normocephalic.  Eyes: EOM are normal.  Neck: Normal range of motion.  Pulmonary/Chest: Effort normal.  Abdominal: He exhibits no distension.  Suprapubic ostomy without surrounding signs of infection or bleeding  Musculoskeletal: Normal range of motion.  Neurological: He is alert and oriented to person, place, and time.  Psychiatric: He has a normal mood and affect.  Nursing note and vitals reviewed.    ED Treatments / Results  Labs (all labs ordered are listed, but only abnormal results are displayed) Labs Reviewed - No data to display  EKG  EKG Interpretation None       Radiology No results found.  Procedures  SUPRAPUBIC CATHETER PLACEMENT BLADDER CATHETERIZATION Performed by: Jola Schmidt Authorized by: Jola Schmidt   Consent:    Consent obtained:  Verbal   Consent given by:  Patient and spouse Pre-procedure details:    Procedure purpose:  Therapeutic Anesthesia (see MAR for exact dosages):    Anesthesia method:  None Procedure details:    Provider performed due to:  Nurse unable to complete (SUPRAPUBIC  CATHETER)   Catheter insertion: SUPRAPUBIC    Catheter size:  20 Fr   Bladder irrigation: no     Number of attempts:  1   Urine characteristics:  Yellow Post-procedure details:    Patient tolerance of procedure:  Tolerated well, no immediate complications   (including critical care time)  Medications Ordered in ED Medications - No data to display   Initial Impression / Assessment and Plan / ED Course  I have reviewed the triage vital signs and the nursing notes.  Pertinent labs & imaging results that were available during my care of the patient were reviewed by me and considered in my medical decision making (see chart for details).     Tolerated procedure well.  Suprapubic placed without difficulty.  Patient will return to the nursing facility  Final Clinical Impressions(s) / ED Diagnoses   Final diagnoses:  Suprapubic catheter dysfunction, initial encounter [facility])    New Prescriptions New Prescriptions   No medications on file     Jola Schmidt, MD 11/06/16 1045

## 2016-11-06 NOTE — ED Triage Notes (Signed)
Patient pulled out his super-pubic catheter about an hour ago. Staff could not re-place.

## 2016-11-06 NOTE — ED Notes (Signed)
Patient pulled super pubic cath out

## 2016-11-06 NOTE — ED Notes (Signed)
Bed: [facility] Expected date:  Expected time:  Means of arrival:  Comments: 

## 2016-11-07 DIAGNOSIS — R296 Repeated falls: Secondary | ICD-10-CM | POA: Diagnosis not present

## 2016-11-07 DIAGNOSIS — M6259 Muscle wasting and atrophy, not elsewhere classified, multiple sites: Secondary | ICD-10-CM | POA: Diagnosis not present

## 2016-11-07 DIAGNOSIS — G3281 Cerebellar ataxia in diseases classified elsewhere: Secondary | ICD-10-CM | POA: Diagnosis not present

## 2016-11-07 DIAGNOSIS — G35 Multiple sclerosis: Secondary | ICD-10-CM | POA: Diagnosis not present

## 2016-11-09 DIAGNOSIS — G3281 Cerebellar ataxia in diseases classified elsewhere: Secondary | ICD-10-CM | POA: Diagnosis not present

## 2016-11-09 DIAGNOSIS — R296 Repeated falls: Secondary | ICD-10-CM | POA: Diagnosis not present

## 2016-11-09 DIAGNOSIS — G35 Multiple sclerosis: Secondary | ICD-10-CM | POA: Diagnosis not present

## 2016-11-09 DIAGNOSIS — M6259 Muscle wasting and atrophy, not elsewhere classified, multiple sites: Secondary | ICD-10-CM | POA: Diagnosis not present

## 2016-11-10 DIAGNOSIS — R296 Repeated falls: Secondary | ICD-10-CM | POA: Diagnosis not present

## 2016-11-10 DIAGNOSIS — M6259 Muscle wasting and atrophy, not elsewhere classified, multiple sites: Secondary | ICD-10-CM | POA: Diagnosis not present

## 2016-11-10 DIAGNOSIS — G3281 Cerebellar ataxia in diseases classified elsewhere: Secondary | ICD-10-CM | POA: Diagnosis not present

## 2016-11-10 DIAGNOSIS — G35 Multiple sclerosis: Secondary | ICD-10-CM | POA: Diagnosis not present

## 2016-11-11 DIAGNOSIS — M6259 Muscle wasting and atrophy, not elsewhere classified, multiple sites: Secondary | ICD-10-CM | POA: Diagnosis not present

## 2016-11-11 DIAGNOSIS — G35 Multiple sclerosis: Secondary | ICD-10-CM | POA: Diagnosis not present

## 2016-11-11 DIAGNOSIS — G3281 Cerebellar ataxia in diseases classified elsewhere: Secondary | ICD-10-CM | POA: Diagnosis not present

## 2016-11-11 DIAGNOSIS — R296 Repeated falls: Secondary | ICD-10-CM | POA: Diagnosis not present

## 2016-11-15 DIAGNOSIS — M6259 Muscle wasting and atrophy, not elsewhere classified, multiple sites: Secondary | ICD-10-CM | POA: Diagnosis not present

## 2016-11-15 DIAGNOSIS — R296 Repeated falls: Secondary | ICD-10-CM | POA: Diagnosis not present

## 2016-11-15 DIAGNOSIS — G3281 Cerebellar ataxia in diseases classified elsewhere: Secondary | ICD-10-CM | POA: Diagnosis not present

## 2016-11-15 DIAGNOSIS — G35 Multiple sclerosis: Secondary | ICD-10-CM | POA: Diagnosis not present

## 2016-11-17 DIAGNOSIS — M6259 Muscle wasting and atrophy, not elsewhere classified, multiple sites: Secondary | ICD-10-CM | POA: Diagnosis not present

## 2016-11-17 DIAGNOSIS — R296 Repeated falls: Secondary | ICD-10-CM | POA: Diagnosis not present

## 2016-11-17 DIAGNOSIS — G3281 Cerebellar ataxia in diseases classified elsewhere: Secondary | ICD-10-CM | POA: Diagnosis not present

## 2016-11-17 DIAGNOSIS — G35 Multiple sclerosis: Secondary | ICD-10-CM | POA: Diagnosis not present

## 2016-11-22 ENCOUNTER — Non-Acute Institutional Stay (SKILLED_NURSING_FACILITY): Payer: Medicare Other | Admitting: Internal Medicine

## 2016-11-22 ENCOUNTER — Encounter: Payer: Self-pay | Admitting: Internal Medicine

## 2016-11-22 DIAGNOSIS — R350 Frequency of micturition: Secondary | ICD-10-CM | POA: Diagnosis not present

## 2016-11-22 DIAGNOSIS — N319 Neuromuscular dysfunction of bladder, unspecified: Secondary | ICD-10-CM

## 2016-11-22 DIAGNOSIS — M6259 Muscle wasting and atrophy, not elsewhere classified, multiple sites: Secondary | ICD-10-CM | POA: Diagnosis not present

## 2016-11-22 DIAGNOSIS — G35 Multiple sclerosis: Secondary | ICD-10-CM | POA: Diagnosis not present

## 2016-11-22 DIAGNOSIS — N401 Enlarged prostate with lower urinary tract symptoms: Secondary | ICD-10-CM

## 2016-11-22 DIAGNOSIS — R531 Weakness: Secondary | ICD-10-CM

## 2016-11-22 DIAGNOSIS — F02818 Dementia in other diseases classified elsewhere, unspecified severity, with other behavioral disturbance: Secondary | ICD-10-CM

## 2016-11-22 DIAGNOSIS — Z9359 Other cystostomy status: Secondary | ICD-10-CM | POA: Diagnosis not present

## 2016-11-22 DIAGNOSIS — R634 Abnormal weight loss: Secondary | ICD-10-CM | POA: Diagnosis not present

## 2016-11-22 DIAGNOSIS — G3281 Cerebellar ataxia in diseases classified elsewhere: Secondary | ICD-10-CM | POA: Diagnosis not present

## 2016-11-22 DIAGNOSIS — R296 Repeated falls: Secondary | ICD-10-CM | POA: Diagnosis not present

## 2016-11-22 DIAGNOSIS — F0281 Dementia in other diseases classified elsewhere with behavioral disturbance: Secondary | ICD-10-CM

## 2016-11-22 NOTE — Progress Notes (Signed)
Patient ID: [name], male   DOB: [birth year], 76 y.o.   MRN: [id number]  Location:  [place] Room Number: [id number] Place of Service:  SNF (31) Provider:  Davita Sublett L. Mariea Clonts, D.O., C.M.D.  Hollace Kinnier, DO  Patient Care Team: [name], DO as PCP - General (Geriatric Medicine) [name], MD as Consulting Physician (Neurology) [name], MD as Consulting Physician (Urology) [name], MD as Consulting Physician (General Surgery) [name], MD as Consulting Physician (Orthopedic Surgery) [name], MD as Consulting Physician (General Surgery) [name], MD as Consulting Physician (Gastroenterology) [name], DO as Consulting Physician (Geriatric Medicine)  Extended Emergency Contact Information Primary Emergency Contact: [name] Address: [address]          [place], [place] [address] [name] of [place] Phone: [phone] Mobile Phone: [phone] Relation: Spouse  Code Status:  DNR Goals of care: Advanced Directive information Advanced Directives 11/22/2016  Does Patient Have a Medical Advance Directive? Yes  Type of Advance Directive Out of facility DNR (pink MOST or yellow form);Living will;Healthcare Power of Attorney  Does patient want to make changes to medical advance directive? No - Patient declined  Copy of [place] in Chart? Yes  Would patient like information on creating a medical advance directive? -  Pre-existing out of facility DNR order (yellow form or pink MOST form) Yellow form placed in chart (order not valid for inpatient use);Pink MOST form placed in chart (order not valid for inpatient use)     Chief Complaint  Patient presents with  . Medical Management of Chronic Issues    Routine Visit    HPI:  Pt is a 76 y.o. male seen today for medical management of chronic diseases.    Lives in SNF, using stand-up lift now, Pt evaluating.  His pole  and tray were removed that he used to attempt to get up on his own.  He is doing better in his [place] chair.  He's doing considerably less yelling except on second shift.  He is hydrating better with help in the dining room which was a problem for a while.  He sometimes falls asleep during meals.  MS:  Felt to be either primary or secondary progressive disease.  Was seen by Dr. Tish Frederickson in 2016 and it was felt that supportive care here at [facility] was the best we could offer him.    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  Cognitive losses continue to progress with worsening short term memory and increased adl needs (see above).  Last MMSE was still 22/30 02/08/16 (suspect considerably worse now).  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  Neurogenic bladder:  Has suprapubic cath but forgets he has it and still tries to go to the restroom to urinate, calling for help especially late afternoon early evening.    Weight loss:  Intake had declined including fluids for a while and he had an episode of uti and dehydration requiring ivfs and pushing po fluids.  Doing better on this lately.  Weight had trended down from his baseline in the upper 180s to 190 about 3 years ago.  Had been in [place] thru 2017 and now down to low of 160 lbs last month 2/18.  Doing better with help in dining room.  Past Medical History:  Diagnosis Date  . Adenomatous polyp   . Allergy   . BPH (  benign prostatic hyperplasia)   . COPD (chronic obstructive pulmonary disease) ([place])   . Dementia 2010  . Elevated PSA 07/31/2014  . Foot drop, right 2008  . 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 Generalized weakness 01/12/2013  . GERD (gastroesophageal reflux disease)   . Hyperlipidemia 07/31/2014  . Multiple sclerosis ([place]) 2008  . Prostatitis, chronic    Past Surgical History:  Procedure Laterality Date  . CATARACT EXTRACTION Left 02/06/12  . CATARACT EXTRACTION Right 2013  . ESOPHAGOGASTRODUODENOSCOPY ENDOSCOPY   04/16/2002   Dr. Earle Gell  . HERNIA REPAIR  1980'2000   3 inguinal hernia repairs on left  . HERNIA REPAIR  06/16/11   right inguinal hernia repair with mesh   . PROSTATE SURGERY  09/29/2008   Dr. Karsten Ro     Allergies  Allergen Reactions  . Aricept [Donepezil Hcl] Other (See Comments)    Reaction:  Unknown   . Dimethyl Fumarate Nausea And Vomiting    Allergies as of 11/22/2016      Reactions   Aricept [donepezil Hcl] Other (See Comments)   Reaction:  Unknown    Dimethyl Fumarate Nausea And Vomiting      Medication List       Accurate as of 11/22/16 12:13 PM. Always use your most recent med list.          aspirin 81 MG chewable tablet Chew 81 mg by mouth daily.   Biotin 5 MG Tabs Take 5 mg by mouth daily.   chlorhexidine 0.12 % solution Commonly known as:  PERIDEX Use as directed 15 mLs in the mouth or throat 2 (two) times daily.   cholecalciferol 1000 units tablet Commonly known as:  VITAMIN D Take 2,000 Units by mouth daily.   ciclopirox 0.77 % cream Commonly known as:  LOPROX Apply 1 application topically 2 (two) times daily. Pt applies to face.   docusate sodium 100 MG capsule Commonly known as:  COLACE Take 100 mg by mouth at bedtime as needed for mild constipation or moderate constipation.   ketoconazole 2 % shampoo Commonly known as:  NIZORAL Apply 1 application topically 2 (two) times a week. Pt uses on Tuesday and Friday.   loperamide 2 MG capsule Commonly known as:  IMODIUM Take 4 mg by mouth as needed for diarrhea or loose stools (at onset of loose stool and 1 tab (2mg ) after each sucessive loose stool).   LORazepam 0.5 MG tablet Commonly known as:  ATIVAN Take 0.5 mg by mouth every 8 (eight) hours as needed for anxiety.   magnesium hydroxide 400 MG/5ML suspension Commonly known as:  MILK OF MAGNESIA Take 15 mLs by mouth daily as needed for mild constipation or moderate constipation.   memantine 10 MG tablet Commonly known as:   NAMENDA Take 10 mg by mouth 2 (two) times daily.   naproxen sodium 220 MG tablet Commonly known as:  ANAPROX Take 220 mg by mouth every 8 (eight) hours as needed (for discomfort).   sertraline 100 MG tablet Commonly known as:  ZOLOFT Take 100 mg by mouth daily.   solifenacin 10 MG tablet Commonly known as:  VESICARE Take 10 mg by mouth daily.       Review of Systems  Constitutional: Negative for activity change, appetite change, chills and fever.  HENT: Negative for congestion.   Eyes: Negative for visual disturbance.       Glasses  Respiratory: Negative for apnea and shortness of breath.   Cardiovascular: Negative for chest pain and leg swelling.  Gastrointestinal: Negative for abdominal pain.  Genitourinary:       Neurogenic bladder with spasms, suprapubic cath in place  Musculoskeletal: Positive for gait problem. Negative for arthralgias.  Skin: Negative for color change.  Neurological: Positive for weakness. Negative for speech difficulty and light-headedness.       Foot drop  Psychiatric/Behavioral: Positive for agitation and confusion. Negative for hallucinations and sleep disturbance.       Occasional agitation when he is very confused on second shift    Immunization History  Administered Date(s) Administered  . Influenza Inj Mdck Quad Pf 07/07/2016  . Influenza-Unspecified 07/02/2015  . Pneumococcal Conjugate-13 02/10/2015  . Pneumococcal Polysaccharide-23 10/23/2006  . Tdap 04/19/2005, 10/25/2016   Pertinent  Health Maintenance Due  Topic Date Due  . COLONOSCOPY  01/14/2019  . INFLUENZA VACCINE  Addressed  . PNA vac Low Risk Adult  Completed   Fall Risk  07/04/2016 02/17/2016 08/23/2015 08/19/2015 08/11/2014  Falls in the past year? Yes No Yes Yes Yes  Number falls in past yr: 2 or more - 1 2 or more 2 or more  Injury with Fall? Yes - Yes Yes -  Risk Factor Category  High Fall Risk - High Fall Risk - -  Risk for fall due to : History of fall(s);Impaired  balance/gait - History of fall(s);Impaired balance/gait;Impaired mobility;Medication side effect;Mental status change - -  Follow up Falls evaluation completed - Falls evaluation completed;Education provided;Falls prevention discussed - -   Functional Status Survey:    Vitals:   11/22/16 1145  BP: 113/72  Pulse: 70  Resp: 20  Temp: 97.6 F (36.4 C)  TempSrc: Oral  SpO2: 96%  Weight: 161 lb (73 kg)   Body mass index is 21.24 kg/m. Physical Exam  Constitutional: No distress.  Thin white male sitting up in broda chair  Cardiovascular: Normal rate, regular rhythm, normal heart sounds and intact distal pulses.  Friction rub: suprapubic cath in place draining dark yellow urine.   Pulmonary/Chest: Effort normal and breath sounds normal. No respiratory distress.  Abdominal: Soft. Bowel sounds are normal. He exhibits no distension. There is no tenderness.  Musculoskeletal:  Right foot drop  Neurological: He is alert.  Oriented to person only  Skin: Skin is warm and dry.  Psychiatric: He has a normal mood and affect.    Labs reviewed:  Recent Labs  08/17/16 08/30/16 10/11/16  NA 142 141 138  K 4.0 4.5 3.7  BUN 26* 18 24*  CREATININE 0.7 0.7 0.8    Recent Labs  05/09/16 1102  AST 19  ALT 17  ALKPHOS 73    Recent Labs  05/09/16 1102 10/11/16  WBC 5.8 18.1  HGB 13.9 13.0*  HCT 39* 40*  PLT 231 165   Lab Results  Component Value Date   TSH 1.47 08/30/2016   Lab Results  Component Value Date   HGBA1C 5.7 (H) 01/13/2013   Lab Results  Component Value Date   CHOL 193 08/17/2016   HDL 46 08/17/2016   LDLCALC 133 08/17/2016   TRIG 86 08/17/2016   CHOLHDL 3.9 05/13/2014    Assessment/Plan 1. Multiple sclerosis ([place]) -felt to be primary progressive or secondary progressive, not felt to be a candidate for additional therapy due to type of MS and dementia -cont supportive care in SNF  2. Dementia associated with other underlying disease with behavioral  disturbance -progressing -remains on namenda due to thought it may be helping behavior/agitation about wanting to use bathroom--received rare ativan for  this behavior when he cannot be redirected and is in danger of hurting himself or staff  3. Neurogenic bladder -ongoing,cont suprapubic catheter which was put in due to his frequency that was leading to more falls  -cont vesicare for his bladder spasms--less anticholinergic that ditropan  4. Benign prostatic hyperplasia with urinary frequency -now has catheter in place  5. Suprapubic catheter [facility]) -for neurogenic bladder, followed at [facility]  6. Generalized weakness -worsening, getting PT, now using standup lift and has broda chair   7. Weight loss, unintentional -seems it has stabilized at this point--intake improved with encouragement and assistance in dining room--cont this added support  Family/ staff Communication: discussed with snf nurse  Labs/tests ordered:  No new

## 2016-12-05 ENCOUNTER — Non-Acute Institutional Stay (SKILLED_NURSING_FACILITY): Payer: Medicare Other | Admitting: Adult Health

## 2016-12-05 DIAGNOSIS — F0281 Dementia in other diseases classified elsewhere with behavioral disturbance: Secondary | ICD-10-CM

## 2016-12-05 DIAGNOSIS — N319 Neuromuscular dysfunction of bladder, unspecified: Secondary | ICD-10-CM | POA: Diagnosis not present

## 2016-12-05 DIAGNOSIS — F02818 Dementia in other diseases classified elsewhere, unspecified severity, with other behavioral disturbance: Secondary | ICD-10-CM

## 2016-12-05 NOTE — Progress Notes (Signed)
Patient ID: [name], male   DOB: 1940-11-24, 76 y.o.   MRN: [id number]   Location:  [place]:  SNF (31) Provider:   Cindi Carbon, ANP [place] [phone]   [name], [name], DO  Patient Care Team: [name], DO as PCP - General (Geriatric Medicine) [name], MD as Consulting Physician (Neurology) [name], MD as Consulting Physician (Urology) [name], MD as Consulting Physician (General Surgery) [name], MD as Consulting Physician (Orthopedic Surgery) [name], MD as Consulting Physician (General Surgery) [name], MD as Consulting Physician (Gastroenterology) [name], DO as Consulting Physician (Geriatric Medicine)  Extended Emergency Contact Information Primary Emergency Contact: [name] Address: [address]          [place], [place] [address] [name] of [place] Phone: [phone] Mobile Phone: [phone] Relation: Spouse  Code Status:  DNR Goals of care: Advanced Directive information Advanced Directives 11/22/2016  Does Patient Have a Medical Advance Directive? Yes  Type of Advance Directive Out of facility DNR (pink MOST or yellow form);Living will;Healthcare Power of Attorney  Does patient want to make changes to medical advance directive? No - Patient declined  Copy of [place] in Chart? Yes  Would patient like information on creating a medical advance directive? -  Pre-existing out of facility DNR order (yellow form or pink MOST form) Yellow form placed in chart (order not valid for inpatient use);Pink MOST form placed in chart (order not valid for inpatient use)     Chief Complaint  Patient presents with  . Acute Visit    agitation    HPI:  Pt is a 76 y.o. male seen today for increased agitation.  He has a hx of MS and dementia, residing in skilled care at [facility].  I was asked to  see him today due to reports of agitation. He yells out for help frequently, appears anxious and reports he needs to urinate even though he is reminded that he has a catheter. He attempts to get out of bed and scoot out of his chair without assistance regularly.  He was previously on Depakote which helped considerably but was later tapered off due to excess sedation. Of note he was sent to the ER in Feb for pulling out his suprapubic cath. He has progressive MS and is ambulating less over time due to right leg weakness.  On Namenda for dementia, last MMSE 22/30 02/08/16.      Past Medical History:  Diagnosis Date  . Adenomatous polyp   . Allergy   . BPH (benign prostatic hyperplasia)   . COPD (chronic obstructive pulmonary disease) ([place])   . Dementia 2010  . Elevated PSA 07/31/2014  . Foot drop, right 2008  . 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 Generalized weakness 01/12/2013  . GERD (gastroesophageal reflux disease)   . Hyperlipidemia 07/31/2014  . Multiple sclerosis ([place]) 2008  . Prostatitis, chronic    Past Surgical History:  Procedure Laterality Date  . CATARACT EXTRACTION Left 02/06/12  . CATARACT EXTRACTION Right 2013  . ESOPHAGOGASTRODUODENOSCOPY ENDOSCOPY  04/16/2002   Dr. Earle Gell  . HERNIA REPAIR  1980'2000   3 inguinal hernia repairs on left  . HERNIA REPAIR  06/16/11   right inguinal hernia repair with mesh   . PROSTATE SURGERY  09/29/2008   Dr. Karsten Ro     Allergies  Allergen Reactions  . Aricept [Donepezil Hcl] Other (  See Comments)    Reaction:  Unknown   . Dimethyl Fumarate Nausea And Vomiting    Allergies as of 12/05/2016      Reactions   Aricept [donepezil Hcl] Other (See Comments)   Reaction:  Unknown    Dimethyl Fumarate Nausea And Vomiting      Medication List       Accurate as of 12/05/16  1:49 PM. Always use your most recent med list.          aspirin 81 MG chewable tablet Chew 81 mg by mouth daily.   Biotin 5 MG Tabs Take 5 mg by mouth  daily.   chlorhexidine 0.12 % solution Commonly known as:  PERIDEX Use as directed 15 mLs in the mouth or throat 2 (two) times daily.   cholecalciferol 1000 units tablet Commonly known as:  VITAMIN D Take 2,000 Units by mouth daily.   ciclopirox 0.77 % cream Commonly known as:  LOPROX Apply 1 application topically 2 (two) times daily. Pt applies to face.   docusate sodium 100 MG capsule Commonly known as:  COLACE Take 100 mg by mouth at bedtime as needed for mild constipation or moderate constipation.   ketoconazole 2 % shampoo Commonly known as:  NIZORAL Apply 1 application topically 2 (two) times a week. Pt uses on Tuesday and Friday.   loperamide 2 MG capsule Commonly known as:  IMODIUM Take 4 mg by mouth as needed for diarrhea or loose stools (at onset of loose stool and 1 tab (2mg ) after each sucessive loose stool).   LORazepam 0.5 MG tablet Commonly known as:  ATIVAN Take 0.5 mg by mouth every 8 (eight) hours as needed for anxiety.   magnesium hydroxide 400 MG/5ML suspension Commonly known as:  MILK OF MAGNESIA Take 15 mLs by mouth daily as needed for mild constipation or moderate constipation.   memantine 10 MG tablet Commonly known as:  NAMENDA Take 10 mg by mouth 2 (two) times daily.   naproxen sodium 220 MG tablet Commonly known as:  ANAPROX Take 220 mg by mouth every 8 (eight) hours as needed (for discomfort).   sertraline 100 MG tablet Commonly known as:  ZOLOFT Take 100 mg by mouth daily.   solifenacin 10 MG tablet Commonly known as:  VESICARE Take 10 mg by mouth daily.      Review of Systems  Constitutional: Positive for weight loss. Negative for chills, diaphoresis, fever and malaise/fatigue.  Respiratory: Negative for cough, hemoptysis, sputum production and wheezing.   Cardiovascular: Negative for chest pain, leg swelling and PND.  Gastrointestinal: Negative for abdominal pain, constipation, diarrhea, nausea and vomiting.  Genitourinary:  Positive for dysuria, frequency and urgency. Negative for flank pain and hematuria.       Chronic bladder issues due to MS  Musculoskeletal: Positive for falls. Negative for back pain, joint pain, myalgias and neck pain.  Skin: Negative for itching and rash.  Neurological: Positive for weakness. Negative for dizziness, tremors, seizures and headaches.       Right sided weakness due to MS  Psychiatric/Behavioral: Positive for memory loss. Negative for depression. The patient is nervous/anxious.      Immunization History  Administered Date(s) Administered  . Influenza Inj Mdck Quad Pf 07/07/2016  . Influenza-Unspecified 07/02/2015  . Pneumococcal Conjugate-13 02/10/2015  . Pneumococcal Polysaccharide-23 10/23/2006  . Tdap 04/19/2005, 10/25/2016   Pertinent  Health Maintenance Due  Topic Date Due  . COLONOSCOPY  01/14/2019  . INFLUENZA VACCINE  Addressed  . PNA vac  Low Risk Adult  Completed   Fall Risk  07/04/2016 02/17/2016 08/23/2015 08/19/2015 08/11/2014  Falls in the past year? Yes No Yes Yes Yes  Number falls in past yr: 2 or more - 1 2 or more 2 or more  Injury with Fall? Yes - Yes Yes -  Risk Factor Category  High Fall Risk - High Fall Risk - -  Risk for fall due to : History of fall(s);Impaired balance/gait - History of fall(s);Impaired balance/gait;Impaired mobility;Medication side effect;Mental status change - -  Follow up Falls evaluation completed - Falls evaluation completed;Education provided;Falls prevention discussed - -   Functional Status Survey:    Vitals:   12/05/16 1347  BP: (!) 95/57  Pulse: 70  Resp: 17  Temp: 97.5 F (36.4 C)  SpO2: 97%  Weight: 161 lb 3.2 oz (73.1 kg)   Wt Readings from Last 3 Encounters:  12/05/16 161 lb 3.2 oz (73.1 kg)  11/22/16 161 lb (73 kg)  11/06/16 160 lb (72.6 kg)   Body mass index is 21.27 kg/m. Physical Exam  Constitutional: No distress.  HENT:  Head: Normocephalic and atraumatic.  Cardiovascular: Normal rate and  regular rhythm.   No murmur heard. Pulmonary/Chest: Effort normal and breath sounds normal.  Abdominal: Soft. Bowel sounds are normal. He exhibits no distension. There is no tenderness.  Musculoskeletal:  Weakness to right arm and right leg  Neurological: He is alert.  Oriented to self, situation, and place not time  Skin: Skin is warm and dry. No rash noted. He is not diaphoretic.  Psychiatric: He has a normal mood and affect.  Nursing note and vitals reviewed.   Labs reviewed:  Recent Labs  08/17/16 08/30/16 10/11/16  NA 142 141 138  K 4.0 4.5 3.7  BUN 26* 18 24*  CREATININE 0.7 0.7 0.8    Recent Labs  05/09/16 1102  AST 19  ALT 17  ALKPHOS 73    Recent Labs  05/09/16 1102 10/11/16  WBC 5.8 18.1  HGB 13.9 13.0*  HCT 39* 40*  PLT 231 165   Lab Results  Component Value Date   TSH 1.47 08/30/2016   Lab Results  Component Value Date   HGBA1C 5.7 (H) 01/13/2013   Lab Results  Component Value Date   CHOL 193 08/17/2016   HDL 46 08/17/2016   LDLCALC 133 08/17/2016   TRIG 86 08/17/2016   CHOLHDL 3.9 05/13/2014    Significant Diagnostic Results in last 30 days:  No results found.  Assessment/Plan  1. Dementia associated with other underlying disease with behavioral disturbance Worsening agitation Add Depakote 125 mg qhs  2. Neurogenic bladder No signs of acute infection Continue vesicare and S/P cath maintenance per staff   Family/ staff Communication: discussed with staff/resident  Labs/tests ordered: NA

## 2016-12-16 DIAGNOSIS — R634 Abnormal weight loss: Secondary | ICD-10-CM | POA: Insufficient documentation

## 2017-01-02 ENCOUNTER — Non-Acute Institutional Stay (SKILLED_NURSING_FACILITY): Payer: Medicare Other | Admitting: Adult Health

## 2017-01-02 DIAGNOSIS — F0281 Dementia in other diseases classified elsewhere with behavioral disturbance: Secondary | ICD-10-CM | POA: Diagnosis not present

## 2017-01-02 DIAGNOSIS — L219 Seborrheic dermatitis, unspecified: Secondary | ICD-10-CM

## 2017-01-02 DIAGNOSIS — F02818 Dementia in other diseases classified elsewhere, unspecified severity, with other behavioral disturbance: Secondary | ICD-10-CM

## 2017-01-02 DIAGNOSIS — G35 Multiple sclerosis: Secondary | ICD-10-CM | POA: Diagnosis not present

## 2017-01-02 DIAGNOSIS — R634 Abnormal weight loss: Secondary | ICD-10-CM

## 2017-01-02 DIAGNOSIS — N319 Neuromuscular dysfunction of bladder, unspecified: Secondary | ICD-10-CM

## 2017-01-03 ENCOUNTER — Encounter: Payer: Self-pay | Admitting: Adult Health

## 2017-01-03 NOTE — Progress Notes (Signed)
Patient ID: [name], male   DOB: Jan 30, 1941, 76 y.o.   MRN: [id number]   Location:  [facility]:  SNF (31) Provider:   Cindi Carbon, ANP [place] [phone]   [name], [name], DO  Patient Care Team: [name], DO as PCP - General (Geriatric Medicine) [name], MD as Consulting Physician (Neurology) [name], MD as Consulting Physician (Urology) [name], MD as Consulting Physician (General Surgery) [name], MD as Consulting Physician (Orthopedic Surgery) [name], MD as Consulting Physician (General Surgery) [name], MD as Consulting Physician (Gastroenterology) [name], DO as Consulting Physician (Geriatric Medicine)  Extended Emergency Contact Information Primary Emergency Contact: [name] Address: [address]          [place], [place] [address] [name] of [place] Phone: [phone] Mobile Phone: [phone] Relation: Spouse  Code Status:  DNR Goals of care: Advanced Directive information Advanced Directives 01/03/2017  Does Patient Have a Medical Advance Directive? -  Type of Advance Directive Out of facility DNR (pink MOST or yellow form);Living will;Healthcare Power of Attorney  Does patient want to make changes to medical advance directive? -  Copy of [place] in Chart? Yes  Would patient like information on creating a medical advance directive? -  Pre-existing out of facility DNR order (yellow form or pink MOST form) Yellow form placed in chart (order not valid for inpatient use);Pink MOST form placed in chart (order not valid for inpatient use)     Chief Complaint  Patient presents with  . Medical Management of Chronic Issues    HPI:  Pt is a 76 y.o. male seen for management of chronic diseases.   He has a hx of MS and dementia, residing in skilled care at [facility].  Staff report  continued agitation, attempts to get OOB without help and yelling "help,help" frequently.  Prn is used regularly with very little benefit. He is currently on Depakote 125 mg qhs.   He frequently reports needing to go to the BR and bladder pain. This is related to his MS/dementia and having a suprapubic cath has not helped, nor has trying vesicare or aleve.  Of note he was sent to the ER in Feb for pulling out his suprapubic cath. On Namenda for dementia, last MMSE 22/30 02/08/16.   He is no longer ambulatory and has a new supportive chair. He is weaker on the right side and needs support to prevent leaning over. He is no longer on meds for MS due to lack of benefit.  Previously he had issues with losing weight precipitously but has put back on 5 lb in the past month.   Past Medical History:  Diagnosis Date  . Adenomatous polyp   . Allergy   . BPH (benign prostatic hyperplasia)   . COPD (chronic obstructive pulmonary disease) ([place])   . Dementia 2010  . Elevated PSA 07/31/2014  . Foot drop, right 2008  . 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 Generalized weakness 01/12/2013  . GERD (gastroesophageal reflux disease)   . Hyperlipidemia 07/31/2014  . Multiple sclerosis ([place]) 2008  . Prostatitis, chronic    Past Surgical History:  Procedure Laterality Date  . CATARACT EXTRACTION Left 02/06/12  . CATARACT EXTRACTION Right 2013  . ESOPHAGOGASTRODUODENOSCOPY ENDOSCOPY  04/16/2002   Dr. Earle Gell  . HERNIA REPAIR  1980'2000   3 inguinal hernia repairs on left  . HERNIA REPAIR  06/16/11   right inguinal hernia repair with mesh   . PROSTATE SURGERY  09/29/2008   Dr. Karsten Ro     Allergies  Allergen Reactions  . Aricept [Donepezil Hcl] Other (See Comments)    Reaction:  Unknown   . Dimethyl Fumarate Nausea And Vomiting    Allergies as of 01/02/2017      Reactions   Aricept [donepezil Hcl] Other (See Comments)   Reaction:  Unknown    Dimethyl Fumarate Nausea And Vomiting      Medication List         Accurate as of 01/02/17 11:59 PM. Always use your most recent med list.          aspirin 81 MG chewable tablet Chew 81 mg by mouth daily.   Biotin 5 MG Tabs Take 5 mg by mouth daily.   chlorhexidine 0.12 % solution Commonly known as:  PERIDEX Use as directed 15 mLs in the mouth or throat 2 (two) times daily.   cholecalciferol 1000 units tablet Commonly known as:  VITAMIN D Take 2,000 Units by mouth daily.   ciclopirox 0.77 % cream Commonly known as:  LOPROX Apply 1 application topically 2 (two) times daily. Pt applies to face.   docusate sodium 100 MG capsule Commonly known as:  COLACE Take 100 mg by mouth at bedtime as needed for mild constipation or moderate constipation.   ketoconazole 2 % shampoo Commonly known as:  NIZORAL Apply 1 application topically 2 (two) times a week. Pt uses on Tuesday and Friday.   loperamide 2 MG capsule Commonly known as:  IMODIUM Take 4 mg by mouth as needed for diarrhea or loose stools (at onset of loose stool and 1 tab (2mg ) after each sucessive loose stool).   LORazepam 0.5 MG tablet Commonly known as:  ATIVAN Take 0.5 mg by mouth every 8 (eight) hours as needed for anxiety.   magnesium hydroxide 400 MG/5ML suspension Commonly known as:  MILK OF MAGNESIA Take 15 mLs by mouth daily as needed for mild constipation or moderate constipation.   memantine 10 MG tablet Commonly known as:  NAMENDA Take 10 mg by mouth 2 (two) times daily.   naproxen sodium 220 MG tablet Commonly known as:  ANAPROX Take 220 mg by mouth every 8 (eight) hours as needed (for discomfort).   sertraline 100 MG tablet Commonly known as:  ZOLOFT Take 100 mg by mouth daily.   solifenacin 10 MG tablet Commonly known as:  VESICARE Take 10 mg by mouth daily.      Review of Systems  Constitutional: Positive for weight loss. Negative for chills, diaphoresis, fever and malaise/fatigue.  Respiratory: Negative for cough, hemoptysis, sputum production and  wheezing.   Cardiovascular: Negative for chest pain, leg swelling and PND.  Gastrointestinal: Negative for abdominal pain, constipation, diarrhea, nausea and vomiting.  Genitourinary: Positive for dysuria, frequency and urgency. Negative for flank pain and hematuria.       Chronic bladder issues due to MS  Musculoskeletal: Positive for falls. Negative for back pain, joint pain, myalgias and neck pain.  Skin: Negative for itching and rash.  Neurological: Positive for weakness. Negative for dizziness, tremors, seizures and headaches.       Right sided weakness due to MS  Psychiatric/Behavioral: Positive for memory loss. Negative for depression. The patient is nervous/anxious.      Immunization History  Administered Date(s) Administered  . Influenza Inj Mdck Quad Pf 07/07/2016  . Influenza-Unspecified 07/02/2015  . Pneumococcal Conjugate-13 02/10/2015  .  Pneumococcal Polysaccharide-23 10/23/2006  . Tdap 04/19/2005, 10/25/2016   Pertinent  Health Maintenance Due  Topic Date Due  . INFLUENZA VACCINE  04/19/2017  . PNA vac Low Risk Adult  Completed   Fall Risk  07/04/2016 02/17/2016 08/23/2015 08/19/2015 08/11/2014  Falls in the past year? Yes No Yes Yes Yes  Number falls in past yr: 2 or more - 1 2 or more 2 or more  Injury with Fall? Yes - Yes Yes -  Risk Factor Category  High Fall Risk - High Fall Risk - -  Risk for fall due to : History of fall(s);Impaired balance/gait - History of fall(s);Impaired balance/gait;Impaired mobility;Medication side effect;Mental status change - -  Follow up Falls evaluation completed - Falls evaluation completed;Education provided;Falls prevention discussed - -   Functional Status Survey:    Vitals:   01/02/17 1432  Weight: 166 lb 11.2 oz (75.6 kg)   Wt Readings from Last 3 Encounters:  01/02/17 166 lb 11.2 oz (75.6 kg)  12/05/16 161 lb 3.2 oz (73.1 kg)  11/22/16 161 lb (73 kg)   Body mass index is 21.99 kg/m. Physical Exam  Constitutional:  No distress.  HENT:  Head: Normocephalic and atraumatic.  Cardiovascular: Normal rate and regular rhythm.   No murmur heard. Pulmonary/Chest: Effort normal and breath sounds normal.  Abdominal: Soft. Bowel sounds are normal. He exhibits no distension. There is no tenderness.  Musculoskeletal:  Weakness to right arm and right leg  Neurological: He is alert.  Oriented to self, situation, and place not time  Skin: Skin is warm and dry. No rash noted. He is not diaphoretic.  Psychiatric: He has a normal mood and affect.  Nursing note and vitals reviewed.   Labs reviewed:  Recent Labs  08/17/16 08/30/16 10/11/16  NA 142 141 138  K 4.0 4.5 3.7  BUN 26* 18 24*  CREATININE 0.7 0.7 0.8    Recent Labs  05/09/16 1102  AST 19  ALT 17  ALKPHOS 73    Recent Labs  05/09/16 1102 10/11/16  WBC 5.8 18.1  HGB 13.9 13.0*  HCT 39* 40*  PLT 231 165   Lab Results  Component Value Date   TSH 1.47 08/30/2016   Lab Results  Component Value Date   HGBA1C 5.7 (H) 01/13/2013   Lab Results  Component Value Date   CHOL 193 08/17/2016   HDL 46 08/17/2016   LDLCALC 133 08/17/2016   TRIG 86 08/17/2016   CHOLHDL 3.9 05/13/2014    Significant Diagnostic Results in last 30 days:  No results found.  Assessment/Plan  1. Dementia associated with other underlying disease with behavioral disturbance Increase Depakote to 125 mg BID, if no improvement consider Seroquel as higher doses of Depakote have proved to cause excess sedation for this gentlemen. Continue Namenda 10 mg BID  2. Multiple sclerosis (HCC) Progressive weakness and functional decline No longer followed by neurology due to difficulty leaving the facility Continue supportive care  3. Weight loss, unintentional Improved  4. Neurogenic bladder s/p suprapubic catheter Continue cath care per facility protocol Continue vesicare for any benefit it may provide  5. Seborrheic dermatitis Continue ciclopirox 0.77% BID to  face Nizoral shampoo twice week to scalp   Family/ staff Communication: discussed with staff/resident  Labs/tests ordered: NA

## 2017-01-04 ENCOUNTER — Non-Acute Institutional Stay (SKILLED_NURSING_FACILITY): Payer: Medicare Other

## 2017-01-04 DIAGNOSIS — Z0000 Encounter for general adult medical examination without abnormal findings: Secondary | ICD-10-CM | POA: Diagnosis not present

## 2017-01-04 NOTE — Progress Notes (Signed)
Subjective:   [name] is a 76 y.o. male who presents for Medicare Annual/Subsequent preventive examination at [facility].     Objective:    Vitals: BP (!) 98/58 (BP Location: Left Arm, Patient Position: Sitting)   Pulse 67   Temp 97.8 F (36.6 C) (Oral)   Ht 6\' 1"  (1.854 m)   Wt 166 lb 11.2 oz (75.6 kg)   BMI 21.99 kg/m   Body mass index is 21.99 kg/m.  Tobacco History  Smoking Status  . Former Smoker  . Packs/day: 0.50  . Years: 20.00  . Quit date: 09/20/1975  Smokeless Tobacco  . Never Used     Counseling given: Not Answered   Past Medical History:  Diagnosis Date  . Adenomatous polyp   . Allergy   . BPH (benign prostatic hyperplasia)   . COPD (chronic obstructive pulmonary disease) ([place])   . Dementia 2010  . Elevated PSA 07/31/2014  . Foot drop, right 2008  . 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 Generalized weakness 01/12/2013  . GERD (gastroesophageal reflux disease)   . Hyperlipidemia 07/31/2014  . Multiple sclerosis ([place]) 2008  . Prostatitis, chronic    Past Surgical History:  Procedure Laterality Date  . CATARACT EXTRACTION Left 02/06/12  . CATARACT EXTRACTION Right 2013  . ESOPHAGOGASTRODUODENOSCOPY ENDOSCOPY  04/16/2002   Dr. Earle Gell  . HERNIA REPAIR  1980'2000   3 inguinal hernia repairs on left  . HERNIA REPAIR  06/16/11   right inguinal hernia repair with mesh   . PROSTATE SURGERY  09/29/2008   Dr. Karsten Ro    Family History  Problem Relation Age of Onset  . Depression Mother   . Dementia Father   . Pneumonia Father    History  Sexual Activity  . Sexual activity: No    Outpatient Encounter Prescriptions as of 01/04/2017  Medication Sig  . aspirin 81 MG chewable tablet Chew 81 mg by mouth daily.  . Biotin 5 MG TABS Take 5 mg by mouth daily.   . chlorhexidine (PERIDEX) 0.12 % solution Use as directed 15 mLs in the mouth or throat 2 (two) times daily.  . cholecalciferol (VITAMIN D) 1000 units tablet Take 2,000 Units by mouth  daily.  . ciclopirox (LOPROX) 0.77 % cream Apply 1 application topically 2 (two) times daily. Pt applies to face.  . docusate sodium (COLACE) 100 MG capsule Take 100 mg by mouth at bedtime as needed for mild constipation or moderate constipation.   Marland Kitchen ketoconazole (NIZORAL) 2 % shampoo Apply 1 application topically 2 (two) times a week. Pt uses on Tuesday and Friday.  . loperamide (IMODIUM) 2 MG capsule Take 4 mg by mouth as needed for diarrhea or loose stools (at onset of loose stool and 1 tab (2mg ) after each sucessive loose stool).   . LORazepam (ATIVAN) 0.5 MG tablet Take 0.5 mg by mouth every 8 (eight) hours as needed for anxiety.   . magnesium hydroxide (MILK OF MAGNESIA) 400 MG/5ML suspension Take 15 mLs by mouth daily as needed for mild constipation or moderate constipation.   . memantine (NAMENDA) 10 MG tablet Take 10 mg by mouth 2 (two) times daily.  . naproxen sodium (ANAPROX) 220 MG tablet Take 220 mg by mouth every 8 (eight) hours as needed (for discomfort).   . sertraline (ZOLOFT) 100 MG tablet Take 100 mg by mouth daily.  . solifenacin (VESICARE) 10 MG tablet Take 10 mg by mouth daily.   No facility-administered encounter medications on file as  of 01/04/2017.     Activities of Daily Living In your present state of health, do you have any difficulty performing the following activities: 01/04/2017  Difficulty concentrating or making decisions? N  In the past six months, have you accidently leaked urine? N  Do you have problems with loss of bowel control? N  Some recent data might be hidden    Patient Care Team: [name], DO as PCP - General (Geriatric Medicine) [name], MD as Consulting Physician (Neurology) [name], MD as Consulting Physician (Urology) [name], MD as Consulting Physician (General Surgery) [name], MD as Consulting Physician (Orthopedic Surgery) [name], MD as Consulting Physician (General Surgery) [name], MD as Consulting Physician (Gastroenterology) [name], DO as Consulting Physician (Geriatric Medicine)   Assessment:    Exercise Activities and Dietary recommendations Current Exercise Habits: Home exercise routine, Type of exercise: Other - see comments;strength training/weights (golf), Time (Minutes): 40, Frequency (Times/Week): 3, Weekly Exercise (Minutes/Week): 120, Intensity: Mild, Exercise limited by: None identified  Goals    None     Fall Risk Fall Risk  01/04/2017 07/04/2016 02/17/2016 08/23/2015 08/19/2015  Falls in the past year? No Yes No Yes Yes  Number falls in past yr: - 2 or more - 1 2 or more  Injury with Fall? - Yes - Yes Yes  Risk Factor Category  - High Fall Risk - High Fall Risk -  Risk for fall due to : - History of fall(s);Impaired balance/gait - History of fall(s);Impaired balance/gait;Impaired mobility;Medication side effect;Mental status change -  Follow up - Falls evaluation completed - Falls evaluation completed;Education provided;Falls prevention discussed -   Depression Screen PHQ 2/9 Scores 01/04/2017 07/04/2016 02/17/2016 08/23/2015  PHQ - 2 Score 0 0 0 0  PHQ- 9 Score - - - -    Cognitive Function MMSE - Mini Mental State Exam 01/04/2017 02/17/2016 02/05/2015  Not completed: Refused - -  Orientation to time - 2 0  Orientation to Place - 4 4  Registration - 3 3  Attention/ Calculation - 5 5  Recall - 0 0  Language- name 2 objects - 2 2  Language- repeat - 1 1  Language- follow 3 step command - 2 3  Language- read & follow direction - 1 1  Write a sentence - 1 1  Copy design - 1 1  Total score - 22 21        Immunization History  Administered Date(s) Administered  . Influenza Inj Mdck Quad Pf 07/07/2016  . Influenza-Unspecified 07/02/2015  . Pneumococcal Conjugate-13 02/10/2015  . Pneumococcal Polysaccharide-23 10/23/2006  . Tdap 04/19/2005, 10/25/2016   Screening Tests Health Maintenance  Topic Date Due  . INFLUENZA  VACCINE  04/19/2017  . TETANUS/TDAP  10/25/2026  . PNA vac Low Risk Adult  Completed      Plan:  I have personally reviewed and addressed the Medicare Annual Wellness questionnaire and have noted the following in the patient's chart:  A. Medical and social history B. Use of alcohol, tobacco or illicit drugs  C. Current medications and supplements D. Functional ability and status E.  Nutritional status F.  Physical activity G. Advance directives H. List of other physicians I.  Hospitalizations, surgeries, and ER visits in previous 12 months J.  [place] to include hearing, vision, cognitive, depression L. Referrals and appointments - none  In addition, I have reviewed and discussed with patient certain preventive protocols, quality metrics, and best  practice recommendations. A written personalized care plan for preventive services as well as general preventive health recommendations were provided to patient.  See attached scanned questionnaire for additional information.   Signed,   Rich Reining, RN Nurse Health Advisor

## 2017-01-04 NOTE — Patient Instructions (Signed)
Mr. [name] , Thank you for taking time to come for your Medicare Wellness Visit. I appreciate your ongoing commitment to your health goals. Please review the following plan we discussed and let me know if I can assist you in the future.   Screening recommendations/referrals: Colonoscopy up to date Recommended yearly ophthalmology/optometry visit for glaucoma screening and checkup Recommended yearly dental visit for hygiene and checkup  Vaccinations: Influenza vaccine up to date Pneumococcal vaccine up to date Tdap vaccine up to date. Due 10/25/2016 Shingles vaccine due.  Advanced directives: In Chart  Conditions/risks identified: None  Next appointment: none upcoming  Preventive Care 58 Years and Older, Male Preventive care refers to lifestyle choices and visits with your health care provider that can promote health and wellness. What does preventive care include?  A yearly physical exam. This is also called an annual well check.  Dental exams once or twice a year.  Routine eye exams. Ask your health care provider how often you should have your eyes checked.  Personal lifestyle choices, including:  Daily care of your teeth and gums.  Regular physical activity.  Eating a healthy diet.  Avoiding tobacco and drug use.  Limiting alcohol use.  Practicing safe sex.  Taking low doses of aspirin every day.  Taking vitamin and mineral supplements as recommended by your health care provider. What happens during an annual well check? The services and screenings done by your health care provider during your annual well check will depend on your age, overall health, lifestyle risk factors, and family history of disease. Counseling  Your health care provider may ask you questions about your:  Alcohol use.  Tobacco use.  Drug use.  Emotional well-being.  Home and relationship well-being.  Sexual activity.  Eating habits.  History of falls.  Memory and ability to  understand (cognition).  Work and work Statistician. Screening  You may have the following tests or measurements:  Height, weight, and BMI.  Blood pressure.  Lipid and cholesterol levels. These may be checked every 5 years, or more frequently if you are over 64 years old.  Skin check.  Lung cancer screening. You may have this screening every year starting at age 50 if you have a 30-pack-year history of smoking and currently smoke or have quit within the past 15 years.  Fecal occult blood test (FOBT) of the stool. You may have this test every year starting at age 74.  Flexible sigmoidoscopy or colonoscopy. You may have a sigmoidoscopy every 5 years or a colonoscopy every 10 years starting at age 76.  Prostate cancer screening. Recommendations will vary depending on your family history and other risks.  Hepatitis C blood test.  Hepatitis B blood test.  Sexually transmitted disease (STD) testing.  Diabetes screening. This is done by checking your blood sugar (glucose) after you have not eaten for a while (fasting). You may have this done every 1-3 years.  Abdominal aortic aneurysm (AAA) screening. You may need this if you are a current or former smoker.  Osteoporosis. You may be screened starting at age 57 if you are at high risk. Talk with your health care provider about your test results, treatment options, and if necessary, the need for more tests. Vaccines  Your health care provider may recommend certain vaccines, such as:  Influenza vaccine. This is recommended every year.  Tetanus, diphtheria, and acellular pertussis (Tdap, Td) vaccine. You may need a Td booster every 10 years.  Zoster vaccine. You may need this after  age 76.  Pneumococcal 13-valent conjugate (PCV13) vaccine. One dose is recommended after age 19.  Pneumococcal polysaccharide (PPSV23) vaccine. One dose is recommended after age 53. Talk to your health care provider about which screenings and vaccines you  need and how often you need them. This information is not intended to replace advice given to you by your health care provider. Make sure you discuss any questions you have with your health care provider. Document Released: 10/02/2015 Document Revised: 05/25/2016 Document Reviewed: 07/07/2015 Elsevier Interactive Patient Education  2017 [place] Prevention in the Home Falls can cause injuries. They can happen to people of all ages. There are many things you can do to make your home safe and to help prevent falls. What can I do on the outside of my home?  Regularly fix the edges of walkways and driveways and fix any cracks.  Remove anything that might make you trip as you walk through a door, such as a raised step or threshold.  Trim any bushes or trees on the path to your home.  Use bright outdoor lighting.  Clear any walking paths of anything that might make someone trip, such as rocks or tools.  Regularly check to see if handrails are loose or broken. Make sure that both sides of any steps have handrails.  Any raised decks and porches should have guardrails on the edges.  Have any leaves, snow, or ice cleared regularly.  Use sand or salt on walking paths during winter.  Clean up any spills in your garage right away. This includes oil or grease spills. What can I do in the bathroom?  Use night lights.  Install grab bars by the toilet and in the tub and shower. Do not use towel bars as grab bars.  Use non-skid mats or decals in the tub or shower.  If you need to sit down in the shower, use a plastic, non-slip stool.  Keep the floor dry. Clean up any water that spills on the floor as soon as it happens.  Remove soap buildup in the tub or shower regularly.  Attach bath mats securely with double-sided non-slip rug tape.  Do not have throw rugs and other things on the floor that can make you trip. What can I do in the bedroom?  Use night lights.  Make sure  that you have a light by your bed that is easy to reach.  Do not use any sheets or blankets that are too big for your bed. They should not hang down onto the floor.  Have a firm chair that has side arms. You can use this for support while you get dressed.  Do not have throw rugs and other things on the floor that can make you trip. What can I do in the kitchen?  Clean up any spills right away.  Avoid walking on wet floors.  Keep items that you use a lot in easy-to-reach places.  If you need to reach something above you, use a strong step stool that has a grab bar.  Keep electrical cords out of the way.  Do not use floor polish or wax that makes floors slippery. If you must use wax, use non-skid floor wax.  Do not have throw rugs and other things on the floor that can make you trip. What can I do with my stairs?  Do not leave any items on the stairs.  Make sure that there are handrails on both sides of the stairs  and use them. Fix handrails that are broken or loose. Make sure that handrails are as long as the stairways.  Check any carpeting to make sure that it is firmly attached to the stairs. Fix any carpet that is loose or worn.  Avoid having throw rugs at the top or bottom of the stairs. If you do have throw rugs, attach them to the floor with carpet tape.  Make sure that you have a light switch at the top of the stairs and the bottom of the stairs. If you do not have them, ask someone to add them for you. What else can I do to help prevent falls?  Wear shoes that:  Do not have high heels.  Have rubber bottoms.  Are comfortable and fit you well.  Are closed at the toe. Do not wear sandals.  If you use a stepladder:  Make sure that it is fully opened. Do not climb a closed stepladder.  Make sure that both sides of the stepladder are locked into place.  Ask someone to hold it for you, if possible.  Clearly mark and make sure that you can see:  Any grab bars or  handrails.  First and last steps.  Where the edge of each step is.  Use tools that help you move around (mobility aids) if they are needed. These include:  Canes.  Walkers.  Scooters.  Crutches.  Turn on the lights when you go into a dark area. Replace any light bulbs as soon as they burn out.  Set up your furniture so you have a clear path. Avoid moving your furniture around.  If any of your floors are uneven, fix them.  If there are any pets around you, be aware of where they are.  Review your medicines with your doctor. Some medicines can make you feel dizzy. This can increase your chance of falling. Ask your doctor what other things that you can do to help prevent falls. This information is not intended to replace advice given to you by your health care provider. Make sure you discuss any questions you have with your health care provider. Document Released: 07/02/2009 Document Revised: 02/11/2016 Document Reviewed: 10/10/2014 Elsevier Interactive Patient Education  2017 [employer].

## 2017-01-04 NOTE — Progress Notes (Signed)
   I reviewed health advisor's note, was available for consultation and agree with the assessment and plan as written.  shingrix clinic will be arranged.    Gaige Fussner L. Bular Hickok, D.O. [facility] [address], [place] [address] Cell Phone (Mon-Fri 8am-5pm):  [phone] On Call:  [phone] & follow prompts after 5pm & weekends Office Phone:  [phone] Office Fax:  [phone]   Quick Notes   Health Maintenance: Shingles vaccine due.     Abnormal Screen: MMSE 13/30. Did not pass clock drawing.     Patient Concerns: None     Nurse Concerns: none

## 2017-01-26 DIAGNOSIS — R319 Hematuria, unspecified: Secondary | ICD-10-CM | POA: Diagnosis not present

## 2017-01-26 DIAGNOSIS — N390 Urinary tract infection, site not specified: Secondary | ICD-10-CM | POA: Diagnosis not present

## 2017-01-30 ENCOUNTER — Non-Acute Institutional Stay (SKILLED_NURSING_FACILITY): Payer: Medicare Other | Admitting: Adult Health

## 2017-01-30 DIAGNOSIS — N319 Neuromuscular dysfunction of bladder, unspecified: Secondary | ICD-10-CM | POA: Diagnosis not present

## 2017-01-30 DIAGNOSIS — F0281 Dementia in other diseases classified elsewhere with behavioral disturbance: Secondary | ICD-10-CM

## 2017-01-30 DIAGNOSIS — F02818 Dementia in other diseases classified elsewhere, unspecified severity, with other behavioral disturbance: Secondary | ICD-10-CM

## 2017-01-30 DIAGNOSIS — W19XXXS Unspecified fall, sequela: Secondary | ICD-10-CM

## 2017-01-30 DIAGNOSIS — R269 Unspecified abnormalities of gait and mobility: Secondary | ICD-10-CM | POA: Diagnosis not present

## 2017-01-30 DIAGNOSIS — I519 Heart disease, unspecified: Secondary | ICD-10-CM | POA: Diagnosis not present

## 2017-01-30 DIAGNOSIS — G35 Multiple sclerosis: Secondary | ICD-10-CM | POA: Diagnosis not present

## 2017-01-30 DIAGNOSIS — K5901 Slow transit constipation: Secondary | ICD-10-CM

## 2017-01-30 DIAGNOSIS — I5189 Other ill-defined heart diseases: Secondary | ICD-10-CM

## 2017-01-30 NOTE — Progress Notes (Signed)
Patient ID: [name], male   DOB: 1940/12/31, 76 y.o.   MRN: [id number]   Location:  [place] of Service:  SNF (31) Provider:   Cindi Carbon, ANP [place] [phone]   [name], DO  Patient Care Team: [name], DO as PCP - General (Geriatric Medicine) [name], MD as Consulting Physician (Neurology) [name], MD as Consulting Physician (Urology) [name], MD as Consulting Physician (General Surgery) [name], MD as Consulting Physician (Orthopedic Surgery) [name], MD as Consulting Physician ([place] [facility]) [name], MD as Consulting Physician (Gastroenterology) [name], DO as Consulting Physician (Geriatric Medicine)  Extended Emergency Contact Information Primary Emergency Contact: [name] Address: [address]          [place], [place] [address] [name] of [place] Phone: [phone] Mobile Phone: [phone] Relation: Spouse  Code Status:  DNR Goals of care: Advanced Directive information Advanced Directives 01/31/2017  Does Patient Have a Medical Advance Directive? Yes  Type of Paramedic of [place];Living will;Out of facility DNR (pink MOST or yellow form)  Does patient want to make changes to medical advance directive? -  Copy of [place] in Chart? Yes  Would patient like information on creating a medical advance directive? -  Pre-existing out of facility DNR order (yellow form or pink MOST form) Yellow form placed in chart (order not valid for inpatient use);Pink MOST form placed in chart (order not valid for inpatient use)     Chief Complaint  Patient presents with  . Medical Management of Chronic Issues    HPI:  Pt is a 76 y.o. male seen for management of chronic diseases.   He has a hx of MS and dementia, residing in skilled care at [facility].  He has  issues with behavioral disturbances and has needed ativan 11 x in the past two weeks. He typically yells "help help" but when approached doesn't not need anything or just needs to go to the BR.  He frequently reports bladder spasms and feels the need to urinate even though he has an s/p catheter. A urine was obtained for this reason with multiple organisms present. He was not treated as he did not meet criteria for obtaining the urine originally. He is currently on Depakote 125 mg BID.  Seroquel was tried for behaviors but his wife thought he was too sleepy after 1 prn dose of Seroquel.  Overall the nurse feels his behaviors are slightly better this week. He has a caregiver now in the afternoon which seems to help.   On Namenda for dementia, last MMSE 22/30 02/08/16.    His MS has progressed to the point where he is no longer ambulatory, more so on the right side. He wears a brace to his right leg.  He is no longer on MS meds due to lack of benefit.    Constipation: prn colace and MOM have only been used 1 x in the past 14 days  Diastolic dysfunction: weight stable, no sob or cp, no edema.  01/14/13 2D echo Suboptimal image quality making interpretation difficult. - Left ventricle: Systolic function was normal. The estimated ejection fraction was in the range of 60% to 65%. Features are consistent with a pseudonormal left ventricular filling pattern, with concomitant abnormal relaxation and increased filling pressure (grade 2 diastolic dysfunction).   Past Medical History:  Diagnosis Date  . Adenomatous  polyp   . Allergy   . BPH (benign prostatic hyperplasia)   . COPD (chronic obstructive pulmonary disease) ([place])   . Dementia 2010  . Elevated PSA 07/31/2014  . Foot drop, right 2008  . 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 Generalized weakness 01/12/2013  . GERD (gastroesophageal reflux disease)   . Hyperlipidemia 07/31/2014  . Multiple sclerosis ([place]) 2008  . Prostatitis, chronic     Past Surgical History:  Procedure Laterality Date  . CATARACT EXTRACTION Left 02/06/12  . CATARACT EXTRACTION Right 2013  . ESOPHAGOGASTRODUODENOSCOPY ENDOSCOPY  04/16/2002   Dr. Earle Gell  . HERNIA REPAIR  1980'2000   3 inguinal hernia repairs on left  . HERNIA REPAIR  06/16/11   right inguinal hernia repair with mesh   . PROSTATE SURGERY  09/29/2008   Dr. Karsten Ro     Allergies  Allergen Reactions  . Aricept [Donepezil Hcl] Other (See Comments)    Reaction:  Unknown   . Dimethyl Fumarate Nausea And Vomiting    Allergies as of 01/30/2017      Reactions   Aricept [donepezil Hcl] Other (See Comments)   Reaction:  Unknown    Dimethyl Fumarate Nausea And Vomiting      Medication List       Accurate as of 01/30/17 11:16 AM. Always use your most recent med list.          aspirin 81 MG chewable tablet Chew 81 mg by mouth daily.   Biotin 5 MG Tabs Take 5 mg by mouth daily.   cholecalciferol 1000 units tablet Commonly known as:  VITAMIN D Take 2,000 Units by mouth daily.   ciclopirox 0.77 % cream Commonly known as:  LOPROX Apply 1 application topically 2 (two) times daily. Pt applies to face.   divalproex 125 MG capsule Commonly known as:  DEPAKOTE SPRINKLE Take 125 mg by mouth 2 (two) times daily.   docusate sodium 100 MG capsule Commonly known as:  COLACE Take 100 mg by mouth at bedtime as needed for mild constipation or moderate constipation.   ketoconazole 2 % shampoo Commonly known as:  NIZORAL Apply 1 application topically 2 (two) times a week. Pt uses on Tuesday and Friday.   loperamide 2 MG capsule Commonly known as:  IMODIUM Take 4 mg by mouth as needed for diarrhea or loose stools (at onset of loose stool and 1 tab (2mg ) after each sucessive loose stool).   LORazepam 0.5 MG tablet Commonly known as:  ATIVAN Take 0.5 mg by mouth every 8 (eight) hours as needed for anxiety.   magnesium hydroxide 400 MG/5ML suspension Commonly known as:  MILK OF  MAGNESIA Take 15 mLs by mouth daily as needed for mild constipation or moderate constipation.   memantine 10 MG tablet Commonly known as:  NAMENDA Take 10 mg by mouth 2 (two) times daily.   naproxen sodium 220 MG tablet Commonly known as:  ANAPROX Take 220 mg by mouth every 8 (eight) hours as needed (for discomfort).   sertraline 100 MG tablet Commonly known as:  ZOLOFT Take 100 mg by mouth daily.   solifenacin 10 MG tablet Commonly known as:  VESICARE Take 10 mg by mouth daily.      Review of Systems  Constitutional: Negative for chills, diaphoresis, fever, malaise/fatigue and weight loss.  Respiratory: Negative for cough, hemoptysis, sputum production and wheezing.   Cardiovascular: Negative for chest pain, leg swelling and PND.  Gastrointestinal: Negative for abdominal pain, constipation, diarrhea, nausea and vomiting.  Genitourinary:  Positive for dysuria, frequency and urgency. Negative for flank pain and hematuria.       Chronic bladder issues due to MS  Musculoskeletal: Negative for back pain, falls, joint pain, myalgias and neck pain.  Skin: Negative for itching and rash.  Neurological: Positive for weakness. Negative for dizziness, tremors, seizures and headaches.       Right sided weakness due to MS  Psychiatric/Behavioral: Positive for memory loss. Negative for depression. The patient is nervous/anxious.      Immunization History  Administered Date(s) Administered  . Influenza Inj Mdck Quad Pf 07/07/2016  . Influenza-Unspecified 07/02/2015  . Pneumococcal Conjugate-13 02/10/2015  . Pneumococcal Polysaccharide-23 10/23/2006  . Tdap 04/19/2005, 10/25/2016   Pertinent  Health Maintenance Due  Topic Date Due  . INFLUENZA VACCINE  04/19/2017  . PNA vac Low Risk Adult  Completed   Fall Risk  01/04/2017 07/04/2016 02/17/2016 08/23/2015 08/19/2015  Falls in the past year? No Yes No Yes Yes  Number falls in past yr: - 2 or more - 1 2 or more  Injury with Fall? - Yes  - Yes Yes  Risk Factor Category  - High Fall Risk - High Fall Risk -  Risk for fall due to : - History of fall(s);Impaired balance/gait - History of fall(s);Impaired balance/gait;Impaired mobility;Medication side effect;Mental status change -  Follow up - Falls evaluation completed - Falls evaluation completed;Education provided;Falls prevention discussed -   Functional Status Survey:    Vitals:   01/31/17 0733  BP: 110/73  Pulse: 65  Resp: 20  Temp: 97.7 F (36.5 C)  SpO2: 100%  Weight: 165 lb (74.8 kg)   Wt Readings from Last 3 Encounters:  01/31/17 165 lb (74.8 kg)  01/04/17 166 lb 11.2 oz (75.6 kg)  01/02/17 166 lb 11.2 oz (75.6 kg)   There is no height or weight on file to calculate BMI.  Physical Exam  Constitutional: No distress.  HENT:  Head: Normocephalic and atraumatic.  Neck: Normal range of motion. Neck supple. No JVD present. No tracheal deviation present. No thyromegaly present.  Cardiovascular: Normal rate and regular rhythm.   No murmur heard. Pulmonary/Chest: Effort normal and breath sounds normal. No respiratory distress. He has no wheezes.  Abdominal: Soft. Bowel sounds are normal. He exhibits no distension. There is no tenderness.  Musculoskeletal: He exhibits no edema or tenderness.  Decreased strength to RUE/RLE 3/5  Lymphadenopathy:    He has no cervical adenopathy.  Neurological: He is alert. No cranial nerve deficit.  Oriented to self and place, not time. Follows commands, pleasant  Skin: Skin is warm and dry. He is not diaphoretic.  Psychiatric: He has a normal mood and affect.    Labs reviewed:  Recent Labs  08/17/16 08/30/16 10/11/16  NA 142 141 138  K 4.0 4.5 3.7  BUN 26* 18 24*  CREATININE 0.7 0.7 0.8    Recent Labs  05/09/16 1102  AST 19  ALT 17  ALKPHOS 73    Recent Labs  05/09/16 1102 10/11/16  WBC 5.8 18.1  HGB 13.9 13.0*  HCT 39* 40*  PLT 231 165   Lab Results  Component Value Date   TSH 1.47 08/30/2016   Lab  Results  Component Value Date   HGBA1C 5.7 (H) 01/13/2013   Lab Results  Component Value Date   CHOL 193 08/17/2016   HDL 46 08/17/2016   LDLCALC 133 08/17/2016   TRIG 86 08/17/2016   CHOLHDL 3.9 05/13/2014    Significant Diagnostic  Results in last 30 days:  No results found.  Assessment/Plan  1. Dementia associated with other underlying disease with behavioral disturbance Improved behaviors per staff Continue Depakote ate 125 mg BID Appreciate caregivers in the afternoon I have asked the staff to encourage him to participate in activities on memory care unit.  2. Multiple sclerosis ([place]) Progressive function losses No longer on meds Supportive care  3. Slow transit constipation Controlled Continue prn colace and MOM  4. Neurogenic bladder Suprapubic cath in place Continues with "bladder pain" and frequent attempts to urinate despite patent s/p cath This is not different than his usual assessment and seems to be exacerbated by his dementia  5. Fall, sequela Less falls at this point as he is weaker and making less attempts to get up without help Fall prec in place Resident is close to the nsg station  6. Diastolic dysfunction Appears compensated on exam Due to his goals of care risk factor modification are not considered  7. Gait disorder No longer ambulatory  Has supportive chair, wears brace to RLE   Family/ staff Communication: discussed with staff/resident  Labs/tests ordered: labs due this week CBC CMP dep level

## 2017-01-31 ENCOUNTER — Encounter: Payer: Self-pay | Admitting: Adult Health

## 2017-02-01 DIAGNOSIS — R569 Unspecified convulsions: Secondary | ICD-10-CM | POA: Diagnosis not present

## 2017-02-01 DIAGNOSIS — F329 Major depressive disorder, single episode, unspecified: Secondary | ICD-10-CM | POA: Diagnosis not present

## 2017-02-01 DIAGNOSIS — F3289 Other specified depressive episodes: Secondary | ICD-10-CM | POA: Diagnosis not present

## 2017-02-01 LAB — HEPATIC FUNCTION PANEL
ALT: 14 (ref 10–40)
AST: 17 (ref 14–40)
Alkaline Phosphatase: 73 (ref 25–125)
Bilirubin, Total: 0.2

## 2017-02-01 LAB — BASIC METABOLIC PANEL
BUN: 21 (ref 4–21)
Creatinine: 0.6 (ref 0.6–1.3)
Glucose: 90
Potassium: 4.7 (ref 3.4–5.3)
Sodium: 142 (ref 137–147)

## 2017-02-01 LAB — CBC AND DIFFERENTIAL
HCT: 43 (ref 41–53)
Hemoglobin: 14.1 (ref 13.5–17.5)
Platelets: 151 (ref 150–399)
WBC: 7.2

## 2017-02-20 ENCOUNTER — Encounter: Payer: Self-pay | Admitting: Adult Health

## 2017-02-20 ENCOUNTER — Non-Acute Institutional Stay (SKILLED_NURSING_FACILITY): Payer: Medicare Other | Admitting: Adult Health

## 2017-02-20 DIAGNOSIS — J309 Allergic rhinitis, unspecified: Secondary | ICD-10-CM | POA: Diagnosis not present

## 2017-02-20 DIAGNOSIS — H6123 Impacted cerumen, bilateral: Secondary | ICD-10-CM | POA: Diagnosis not present

## 2017-02-20 DIAGNOSIS — L219 Seborrheic dermatitis, unspecified: Secondary | ICD-10-CM | POA: Diagnosis not present

## 2017-02-20 DIAGNOSIS — H1013 Acute atopic conjunctivitis, bilateral: Secondary | ICD-10-CM

## 2017-02-20 NOTE — Progress Notes (Signed)
Location:  [facility]up[facility] psychologist of Service:  SNF (31) Provider:   Cindi Carbon, ANP [place] [phone]   [name], DO  Patient Care Team: [name], DO as PCP - General (Geriatric Medicine) [name], MD as Consulting Physician (Neurology) [name], MD as Consulting Physician (Urology) [name], MD as Consulting Physician (General Surgery) [name], MD as Consulting Physician (Orthopedic Surgery) [name], MD as Consulting Physician ([facility]) [name], MD as Consulting Physician (Gastroenterology) [name], DO as Consulting Physician (Geriatric Medicine)  Extended Emergency Contact Information Primary Emergency Contact: [name] Address: [address]          [place], [place] [address] [name] of [place] Phone: [phone] Mobile Phone: [phone] Relation: Spouse  Code Status:  DNR Goals of care: Advanced Directive information Advanced Directives 01/31/2017  Does Patient Have a Medical Advance Directive? Yes  Type of Paramedic of [place];Living will;Out of facility DNR (pink MOST or yellow form)  Does patient want to make changes to medical advance directive? -  Copy of [place] in Chart? Yes  Would patient like information on creating a medical advance directive? -  Pre-existing out of facility DNR order (yellow form or pink MOST form) Yellow form placed in chart (order not valid for inpatient use);Pink MOST form placed in chart (order not valid for inpatient use)     Chief Complaint  Patient presents with  . Acute Visit    rash and watery eyes    HPI:  Pt is a 76 y.o. male seen today for an acute visit for an itchy flaky rash to his face, as well as itchy watery eyes and runny nose. He has a hx of seborrhea and uses ciclopirox BID but there may be an issue with compliance.  The nurse reports  that for the past 2-3 days the areas to his face have worsened and cause him considerable discomfort due to itching.  No cough or fever noted.  He denies a hx of seasonal allergies but currently has watery eyes that itch and clear nasal drainage.    Past Medical History:  Diagnosis Date  . Adenomatous polyp   . Allergy   . BPH (benign prostatic hyperplasia)   . COPD (chronic obstructive pulmonary disease) ([place])   . Dementia 2010  . Elevated PSA 07/31/2014  . Foot drop, right 2008  . 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 Generalized weakness 01/12/2013  . GERD (gastroesophageal reflux disease)   . Hyperlipidemia 07/31/2014  . Multiple sclerosis ([place]) 2008  . Prostatitis, chronic    Past Surgical History:  Procedure Laterality Date  . CATARACT EXTRACTION Left 02/06/12  . CATARACT EXTRACTION Right 2013  . ESOPHAGOGASTRODUODENOSCOPY ENDOSCOPY  04/16/2002   Dr. [facility] Gell  . HERNIA REPAIR  1980'2000   3 inguinal hernia repairs on left  . HERNIA REPAIR  06/16/11   right inguinal hernia repair with mesh   . PROSTATE SURGERY  09/29/2008   Dr. Karsten Ro     Allergies  Allergen Reactions  . Aricept [Donepezil Hcl] Other (See Comments)    Reaction:  Unknown   . Dimethyl Fumarate Nausea And Vomiting    Outpatient Encounter Prescriptions as of 02/20/2017  Medication Sig  . aspirin 81 MG chewable tablet Chew 81 mg by mouth daily.  . Biotin 5 MG TABS Take 5 mg by mouth daily.   . cholecalciferol (VITAMIN D) 1000 units  tablet Take 2,000 Units by mouth daily.  . ciclopirox (LOPROX) 0.77 % cream Apply 1 application topically 2 (two) times daily. Pt applies to face.  . divalproex (DEPAKOTE SPRINKLE) 125 MG capsule Take 125 mg by mouth 2 (two) times daily.   Marland Kitchen docusate sodium (COLACE) 100 MG capsule Take 100 mg by mouth at bedtime as needed for mild constipation or moderate constipation.   Marland Kitchen ketoconazole (NIZORAL) 2 % shampoo Apply 1 application topically 2 (two) times a week. Pt uses on Tuesday and  Friday.  . loperamide (IMODIUM) 2 MG capsule Take 4 mg by mouth as needed for diarrhea or loose stools (at onset of loose stool and 1 tab (2mg ) after each sucessive loose stool).   . LORazepam (ATIVAN) 0.5 MG tablet Take 0.5 mg by mouth every 8 (eight) hours as needed for anxiety.   . magnesium hydroxide (MILK OF MAGNESIA) 400 MG/5ML suspension Take 15 mLs by mouth daily as needed for mild constipation or moderate constipation.   . memantine (NAMENDA) 10 MG tablet Take 10 mg by mouth 2 (two) times daily.  . naproxen sodium (ANAPROX) 220 MG tablet Take 220 mg by mouth every 8 (eight) hours as needed (for discomfort).   . sertraline (ZOLOFT) 100 MG tablet Take 100 mg by mouth daily.  . solifenacin (VESICARE) 10 MG tablet Take 10 mg by mouth daily.   No facility-administered encounter medications on file as of 02/20/2017.     Review of Systems  Unable to perform ROS: Dementia    Immunization History  Administered Date(s) Administered  . Influenza Inj Mdck Quad Pf 07/07/2016  . Influenza-Unspecified 07/02/2015  . Pneumococcal Conjugate-13 02/10/2015  . Pneumococcal Polysaccharide-23 10/23/2006  . Tdap 04/19/2005, 10/25/2016   Pertinent  Health Maintenance Due  Topic Date Due  . INFLUENZA VACCINE  04/19/2017  . PNA vac Low Risk Adult  Completed   Fall Risk  01/04/2017 07/04/2016 02/17/2016 08/23/2015 08/19/2015  Falls in the past year? No Yes No Yes Yes  Number falls in past yr: - 2 or more - 1 2 or more  Injury with Fall? - Yes - Yes Yes  Risk Factor Category  - High Fall Risk - High Fall Risk -  Risk for fall due to : - History of fall(s);Impaired balance/gait - History of fall(s);Impaired balance/gait;Impaired mobility;Medication side effect;Mental status change -  Follow up - Falls evaluation completed - Falls evaluation completed;Education provided;Falls prevention discussed -   Functional Status Survey:    Vitals:   02/20/17 1422  BP: 118/75  Pulse: 68  Resp: 19  Temp: 98.9  F (37.2 C)  SpO2: 92%   There is no height or weight on file to calculate BMI. Physical Exam  Constitutional: No distress.  HENT:  Head: Normocephalic and atraumatic.  Mouth/Throat: Oropharynx is clear and moist. No oropharyngeal exudate.  Bilateral cerumen impaction. Clear drainage to both nares  Eyes: Pupils are equal, round, and reactive to light. Right eye exhibits no discharge. Left eye exhibits no discharge.  Erythema to bilat sclera  Cardiovascular: Normal rate and regular rhythm.   No murmur heard. Pulmonary/Chest: Effort normal and breath sounds normal.  Abdominal: Soft. Bowel sounds are normal.  Lymphadenopathy:    He has no cervical adenopathy.  Neurological: He is alert.  Skin: Rash (flaky, erythematous rash to nasolabial folds, ears, cheeks, chin, neck) noted. He is not diaphoretic.  Psychiatric: He has a normal mood and affect.  Nursing note and vitals reviewed.   Labs reviewed:  Recent  Labs  08/17/16 08/30/16 10/11/16  NA 142 141 138  K 4.0 4.5 3.7  BUN 26* 18 24*  CREATININE 0.7 0.7 0.8    Recent Labs  05/09/16 1102  AST 19  ALT 17  ALKPHOS 73    Recent Labs  05/09/16 1102 10/11/16  WBC 5.8 18.1  HGB 13.9 13.0*  HCT 39* 40*  PLT 231 165   Lab Results  Component Value Date   TSH 1.47 08/30/2016   Lab Results  Component Value Date   HGBA1C 5.7 (H) 01/13/2013   Lab Results  Component Value Date   CHOL 193 08/17/2016   HDL 46 08/17/2016   LDLCALC 133 08/17/2016   TRIG 86 08/17/2016   CHOLHDL 3.9 05/13/2014    Significant Diagnostic Results in last 30 days:  No results found.  Assessment/Plan  1. Allergic conjunctivitis of both eyes and rhinitis Zyrtec 10 mg qhs  2. Seborrheic dermatitis Triamcinolone cream 0.1 % to affected areas BID for 1 week Continue ciclopirox BID and ketoconazole shampoo twice weekly  3. Cerumen impaction Initiate protocol with debrox gtts and irrigation   Family/ staff Communication: discussed with  staff/resident  Labs/tests ordered:  NA

## 2017-03-07 ENCOUNTER — Encounter: Payer: Self-pay | Admitting: Internal Medicine

## 2017-03-07 ENCOUNTER — Non-Acute Institutional Stay (SKILLED_NURSING_FACILITY): Payer: Medicare Other | Admitting: Internal Medicine

## 2017-03-07 DIAGNOSIS — N319 Neuromuscular dysfunction of bladder, unspecified: Secondary | ICD-10-CM

## 2017-03-07 DIAGNOSIS — J309 Allergic rhinitis, unspecified: Secondary | ICD-10-CM | POA: Diagnosis not present

## 2017-03-07 DIAGNOSIS — H1013 Acute atopic conjunctivitis, bilateral: Secondary | ICD-10-CM

## 2017-03-07 DIAGNOSIS — F0281 Dementia in other diseases classified elsewhere with behavioral disturbance: Secondary | ICD-10-CM | POA: Diagnosis not present

## 2017-03-07 DIAGNOSIS — Z9359 Other cystostomy status: Secondary | ICD-10-CM | POA: Diagnosis not present

## 2017-03-07 DIAGNOSIS — F02818 Dementia in other diseases classified elsewhere, unspecified severity, with other behavioral disturbance: Secondary | ICD-10-CM

## 2017-03-07 DIAGNOSIS — G35 Multiple sclerosis: Secondary | ICD-10-CM

## 2017-03-07 DIAGNOSIS — R634 Abnormal weight loss: Secondary | ICD-10-CM | POA: Diagnosis not present

## 2017-03-07 NOTE — Progress Notes (Signed)
Patient ID: [name], male   DOB: October 18, 1940, 76 y.o.   MRN: [id number]  Location:  [facility] Room Number: 824 Place of Service:  SNF (31) Provider:  [name], DO  Patient Care Team: [name], DO as PCP - General (Geriatric Medicine) [name], MD as Consulting Physician (Neurology) [name], MD as Consulting Physician (Urology) [name], MD as Consulting Physician (General Surgery) [name], MD as Consulting Physician (Orthopedic Surgery) [name], MD as Consulting Physician ([facility]) [name], MD as Consulting Physician (Gastroenterology) [name], DO as Consulting Physician (Geriatric Medicine)  Extended Emergency Contact Information Primary Emergency Contact: Colver,Peggy Address: [address]          [place], [place] [address] [name] of [place] Phone: [phone] Mobile Phone: [phone] Relation: Spouse  Code Status:  DNR Goals of care: Advanced Directive information Advanced Directives 03/07/2017  Does Patient Have a Medical Advance Directive? Yes  Type of Advance Directive Out of facility DNR (pink MOST or yellow form);[place];Living will  Does patient want to make changes to medical advance directive? -  Copy of [place] in Chart? Yes  Would patient like information on creating a medical advance directive? -  Pre-existing out of facility DNR order (yellow form or pink MOST form) Yellow form placed in chart (order not valid for inpatient use);Pink MOST form placed in chart (order not valid for inpatient use)   Chief Complaint  Patient presents with  . Medical Management of Chronic Issues    routine visit    HPI:  Pt is a 76 y.o. male seen today for medical management of chronic diseases.    MS:  No longer a candidate for treatment.  Has foot drop due to disease.  Uses wheelchair high back manual  to get around.    Neurogenic bladder/retention:  Less issues with using bathroom requests (has had suprapubic for a long time now), but continues to call for help at times.  He now has an evening caregiver which has helped considerably.  SP tube was changed today  (monthly).  He had aleve and ativan this am due to this process.    He was noted to have cerumen impaction and debrox protocol was initiated and he was treated for allergies (itchy water eyes) beginning 6/4.  These seem improved.    Dementia continues to progress.  He can no longer carry on a conversation, only answer questions.  He is dependent in adls except to feed himself.    Weight was trending down, but has now gone up 4 lbs since last visit. On supplements.    Past Medical History:  Diagnosis Date  . Adenomatous polyp   . Allergy   . BPH (benign prostatic hyperplasia)   . COPD (chronic obstructive pulmonary disease) ([facility])   . Dementia 2010  . Elevated PSA 07/31/2014  . Foot drop, right 2008  . 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 Generalized weakness 01/12/2013  . GERD (gastroesophageal reflux disease)   . Hyperlipidemia 07/31/2014  . Multiple sclerosis ([place]) 2008  . Prostatitis, chronic    Past Surgical History:  Procedure Laterality Date  . CATARACT EXTRACTION Left 02/06/12  . CATARACT EXTRACTION Right 2013  . ESOPHAGOGASTRODUODENOSCOPY ENDOSCOPY  04/16/2002   Dr. [facility] Gell  . HERNIA REPAIR  1980'2000   3 inguinal hernia repairs on left  . HERNIA REPAIR  06/16/11   right inguinal hernia  repair with mesh   . PROSTATE SURGERY  09/29/2008   Dr. Karsten Ro     Allergies  Allergen Reactions  . Aricept [Donepezil Hcl] Other (See Comments)    Reaction:  Unknown   . Dimethyl Fumarate Nausea And Vomiting    Outpatient Encounter Prescriptions as of 03/07/2017  Medication Sig  . aspirin 81 MG chewable tablet Chew 81 mg by mouth daily.  . Biotin 5 MG TABS Take 5 mg by mouth daily.   . cetirizine (ZYRTEC) 10 MG tablet Take  10 mg by mouth at bedtime.  . cholecalciferol (VITAMIN D) 1000 units tablet Take 2,000 Units by mouth daily.  . ciclopirox (LOPROX) 0.77 % cream Apply 1 application topically 2 (two) times daily. Pt applies to face.  . divalproex (DEPAKOTE SPRINKLE) 125 MG capsule Take 125 mg by mouth 2 (two) times daily.   Marland Kitchen docusate sodium (COLACE) 100 MG capsule Take 100 mg by mouth at bedtime as needed for mild constipation or moderate constipation.   Marland Kitchen ketoconazole (NIZORAL) 2 % shampoo Apply 1 application topically 2 (two) times a week. Pt uses on Tuesday and Friday.  . loperamide (IMODIUM) 2 MG capsule Take 4 mg by mouth as needed for diarrhea or loose stools (at onset of loose stool and 1 tab (2mg ) after each sucessive loose stool).   . LORazepam (ATIVAN) 0.5 MG tablet Take 0.5 mg by mouth every 8 (eight) hours as needed for anxiety.   . magnesium hydroxide (MILK OF MAGNESIA) 400 MG/5ML suspension Take 15 mLs by mouth daily as needed for mild constipation or moderate constipation.   . memantine (NAMENDA) 10 MG tablet Take 10 mg by mouth 2 (two) times daily.  . naproxen sodium (ANAPROX) 220 MG tablet Take 220 mg by mouth every 8 (eight) hours as needed (for discomfort).   . sertraline (ZOLOFT) 100 MG tablet Take 100 mg by mouth daily.  . solifenacin (VESICARE) 10 MG tablet Take 10 mg by mouth daily.   No facility-administered encounter medications on file as of 03/07/2017.     Review of Systems  Constitutional: Negative for activity change, appetite change, chills, fatigue, fever and unexpected weight change.  HENT: Negative for congestion.        Cerumen impaction treatment ongoing  Eyes: Negative for pain, redness, itching and visual disturbance.       Glasses  Respiratory: Negative for chest tightness and shortness of breath.   Cardiovascular: Negative for chest pain and leg swelling.  Gastrointestinal: Negative for abdominal pain.  Genitourinary:       Urinary retention with suprapubic catheter    Musculoskeletal: Negative for joint swelling.  Neurological: Positive for weakness. Negative for dizziness.       Foot drop  Hematological: Does not bruise/bleed easily.  Psychiatric/Behavioral: Positive for behavioral problems and confusion. Negative for agitation and sleep disturbance. The patient is nervous/anxious.     Immunization History  Administered Date(s) Administered  . Influenza Inj Mdck Quad Pf 07/07/2016  . Influenza-Unspecified 07/02/2015  . Pneumococcal Conjugate-13 02/10/2015  . Pneumococcal Polysaccharide-23 10/23/2006  . Tdap 04/19/2005, 10/25/2016   Pertinent  Health Maintenance Due  Topic Date Due  . INFLUENZA VACCINE  04/19/2017  . PNA vac Low Risk Adult  Completed   Fall Risk  01/04/2017 07/04/2016 02/17/2016 08/23/2015 08/19/2015  Falls in the past year? No Yes No Yes Yes  Number falls in past yr: - 2 or more - 1 2 or more  Injury with Fall? - Yes - Yes Yes  Risk Factor Category  - High Fall Risk - High Fall Risk -  Risk for fall due to : - History of fall(s);Impaired balance/gait - History of fall(s);Impaired balance/gait;Impaired mobility;Medication side effect;Mental status change -  Follow up - Falls evaluation completed - Falls evaluation completed;Education provided;Falls prevention discussed -   Functional Status Survey:    Vitals:   03/07/17 1142  BP: 118/73  Pulse: 68  Resp: 19  Temp: 98.1 F (36.7 C)  TempSrc: Oral  SpO2: 92%  Weight: 169 lb (76.7 kg)   Body mass index is 22.3 kg/m. Physical Exam  Constitutional: No distress.  Cardiovascular: Normal rate, regular rhythm, normal heart sounds and intact distal pulses.   Pulmonary/Chest: Effort normal and breath sounds normal. No respiratory distress.  Abdominal: Soft. Bowel sounds are normal.  Genitourinary:  Genitourinary Comments: Suprapubic cath in place, draining dark yellow urine  Musculoskeletal:  Foot drop, seated in highbacked wheelchair  Neurological: He is alert.  Skin:  Skin is warm and dry.  Psychiatric:  Very pleasant    Labs reviewed:  Recent Labs  08/30/16 10/11/16 02/01/17 0600  NA 141 138 142  K 4.5 3.7 4.7  BUN 18 24* 21  CREATININE 0.7 0.8 0.6    Recent Labs  05/09/16 1102 02/01/17 0600  AST 19 17  ALT 17 14  ALKPHOS 73 73    Recent Labs  05/09/16 1102 10/11/16 02/01/17 0600  WBC 5.8 18.1 7.2  HGB 13.9 13.0* 14.1  HCT 39* 40* 43  PLT 231 165 151   Lab Results  Component Value Date   TSH 1.47 08/30/2016   Lab Results  Component Value Date   HGBA1C 5.7 (H) 01/13/2013   Lab Results  Component Value Date   CHOL 193 08/17/2016   HDL 46 08/17/2016   LDLCALC 133 08/17/2016   TRIG 86 08/17/2016   CHOLHDL 3.9 05/13/2014    Assessment/Plan 1. Multiple sclerosis ([place]) -no longer tx candidate due to advanced dementia, has not had any significant relapses or obvious progression that I've seen  2. Suprapubic catheter ([place]) -in place due to #4, has vesicare to help with bladder spasms, also naproxen prn (renal function good and no GI upset noted)  3. Weight loss, unintentional -stabilized now, perhaps helped by evening caregiver presence?  4. Neurogenic bladder -cont suprapubic cath which is changed monthly, vesicare for spasms  5. Dementia associated with other underlying disease with behavioral disturbance -continues on namenda and has depakote for mood stabilization due to periods of agitation where he cannot be redirected, was at one time quite belligerent with staff, but no reports of this recently, seems calmer and more easily redirected, primarily had been an evening issue and caregiver presence should help  6. Allergic conjunctivitis of both eyes and rhinitis -better after treatment with zyrtec  Family/ staff Communication: discussed with snf nurse  Labs/tests ordered:  No new   Tamika Shropshire L. Doninique Lwin, D.O. [facility] [address], [place] [address] Cell  Phone (Mon-Fri 8am-5pm):  [phone] On Call:  [phone] & follow prompts after 5pm & weekends Office Phone:  [phone] Office Fax:  [phone]

## 2017-03-30 ENCOUNTER — Non-Acute Institutional Stay (SKILLED_NURSING_FACILITY): Payer: Medicare Other | Admitting: Adult Health

## 2017-03-30 ENCOUNTER — Encounter: Payer: Self-pay | Admitting: Adult Health

## 2017-03-30 DIAGNOSIS — G35 Multiple sclerosis: Secondary | ICD-10-CM | POA: Diagnosis not present

## 2017-03-30 DIAGNOSIS — J309 Allergic rhinitis, unspecified: Secondary | ICD-10-CM

## 2017-03-30 DIAGNOSIS — K5901 Slow transit constipation: Secondary | ICD-10-CM

## 2017-03-30 DIAGNOSIS — M436 Torticollis: Secondary | ICD-10-CM

## 2017-03-30 DIAGNOSIS — M21371 Foot drop, right foot: Secondary | ICD-10-CM | POA: Diagnosis not present

## 2017-03-30 DIAGNOSIS — F0281 Dementia in other diseases classified elsewhere with behavioral disturbance: Secondary | ICD-10-CM | POA: Diagnosis not present

## 2017-03-30 DIAGNOSIS — F02818 Dementia in other diseases classified elsewhere, unspecified severity, with other behavioral disturbance: Secondary | ICD-10-CM

## 2017-03-30 NOTE — Progress Notes (Signed)
Patient ID: [name], male   DOB: Jul 31, 1941, 76 y.o.   MRN: [id number]   Location:  [facility] of Service:  SNF (31) Provider:   Cindi Carbon, ANP [place] [phone]   [name], DO  Patient Care Team: [name], DO as PCP - General (Geriatric Medicine) [name], MD as Consulting Physician (Neurology) [name], MD as Consulting Physician (Urology) [name], MD as Consulting Physician (General Surgery) [name], MD as Consulting Physician (Orthopedic Surgery) [name], MD as Consulting Physician ([facility]) [name], MD as Consulting Physician (Gastroenterology) [name], DO as Consulting Physician (Geriatric Medicine)  Extended Emergency Contact Information Primary Emergency Contact: [name] Address: [address]          [place], [place] [address] [name] of [place] Phone: [phone] Mobile Phone: [phone] Relation: Spouse  Code Status:  DNR Goals of care: Advanced Directive information Advanced Directives 03/30/2017  Does Patient Have a Medical Advance Directive? Yes  Type of Advance Directive Out of facility DNR (pink MOST or yellow form);[place];Living will  Does patient want to make changes to medical advance directive? -  Copy of [place] in Chart? Yes  Would patient like information on creating a medical advance directive? -  Pre-existing out of facility DNR order (yellow form or pink MOST form) -     Chief Complaint  Patient presents with  . Medical Management of Chronic Issues    HPI:  Pt is a 76 y.o. male seen for management of chronic diseases.   He has a hx of MS and dementia, residing in skilled care at [facility].  He has issues with behavioral disturbances and has needed ativan 5 x in the past two weeks. He has used aleve 8 x in the past two weeks  for bladder discomfort (chronic issue).  On Namenda for dementia related to [place], last MMSE 13/30 01/04/17 which is a significant drop from his previous score of 22 in 2017.    His MS has progressed to the point where he is no longer ambulatory, more so on the right side. He wears a brace to his right leg.  He is no longer on MS meds due to lack of benefit.    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 Adenomatous polyp   . Allergy   . BPH (benign prostatic hyperplasia)   . COPD (chronic obstructive pulmonary disease) ([place])   . Dementia 2010  . Elevated PSA 07/31/2014  . Foot drop, right 2008  . 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 Generalized weakness 01/12/2013  . GERD (gastroesophageal reflux disease)   . Hyperlipidemia 07/31/2014  . Multiple sclerosis ([place]) 2008  . Prostatitis, chronic    Past Surgical History:  Procedure Laterality Date  . CATARACT EXTRACTION Left 02/06/12  . CATARACT EXTRACTION Right 2013  . ESOPHAGOGASTRODUODENOSCOPY ENDOSCOPY  04/16/2002   Dr. Earle Gell  . HERNIA REPAIR  1980'2000   3 inguinal hernia repairs on left  . HERNIA REPAIR  06/16/11   right inguinal hernia repair with mesh   . PROSTATE SURGERY  09/29/2008  Dr. Karsten Ro     Allergies  Allergen Reactions  . Aricept [Donepezil Hcl] Other (See Comments)    Reaction:  Unknown   . Dimethyl Fumarate Nausea And Vomiting    Allergies as of 03/30/2017      Reactions   Aricept [donepezil Hcl] Other (See Comments)   Reaction:  Unknown    Dimethyl Fumarate Nausea And Vomiting      Medication List       Accurate as of 03/30/17  4:24 PM. Always use your most recent med list.          aspirin 81 MG chewable  tablet Chew 81 mg by mouth daily.   Biotin 5 MG Tabs Take 5 mg by mouth daily.   cetirizine 10 MG tablet Commonly known as:  ZYRTEC Take 10 mg by mouth at bedtime.   cholecalciferol 1000 units tablet Commonly known as:  VITAMIN D Take 2,000 Units by mouth daily.   ciclopirox 0.77 % cream Commonly known as:  LOPROX Apply 1 application topically 2 (two) times daily. Pt applies to face.   divalproex 125 MG capsule Commonly known as:  DEPAKOTE SPRINKLE Take 125 mg by mouth 2 (two) times daily.   docusate sodium 100 MG capsule Commonly known as:  COLACE Take 100 mg by mouth at bedtime as needed for mild constipation or moderate constipation.   ketoconazole 2 % shampoo Commonly known as:  NIZORAL Apply 1 application topically 2 (two) times a week. Pt uses on Tuesday and Friday.   loperamide 2 MG capsule Commonly known as:  IMODIUM Take 4 mg by mouth as needed for diarrhea or loose stools (at onset of loose stool and 1 tab (2mg ) after each sucessive loose stool).   LORazepam 0.5 MG tablet Commonly known as:  ATIVAN Take 0.5 mg by mouth every 8 (eight) hours as needed for anxiety.   magnesium hydroxide 400 MG/5ML suspension Commonly known as:  MILK OF MAGNESIA Take 15 mLs by mouth daily as needed for mild constipation or moderate constipation.   memantine 10 MG tablet Commonly known as:  NAMENDA Take 10 mg by mouth 2 (two) times daily.   naproxen sodium 220 MG tablet Commonly known as:  ANAPROX Take 220 mg by mouth every 8 (eight) hours as needed (for discomfort).   polyethylene glycol packet Commonly known as:  MIRALAX / GLYCOLAX Take 17 g by mouth every other day.   sertraline 100 MG tablet Commonly known as:  ZOLOFT Take 100 mg by mouth daily.   solifenacin 10 MG tablet Commonly known as:  VESICARE Take 10 mg by mouth daily.      Review of Systems  Constitutional: Negative for chills, diaphoresis, fever, malaise/fatigue and weight loss.  Respiratory:  Negative for cough, hemoptysis, sputum production and wheezing.   Cardiovascular: Negative for chest pain, leg swelling and PND.  Gastrointestinal: Negative for abdominal pain, constipation, diarrhea, nausea and vomiting.  Genitourinary: Positive for dysuria, frequency and urgency. Negative for flank pain and hematuria.       Chronic bladder issues due to MS  Musculoskeletal: Positive for falls. Negative for back pain, joint pain, myalgias and neck pain.  Skin: Negative for itching and rash.  Neurological: Positive for focal weakness (right foot drop) and weakness. Negative for dizziness, tremors, seizures and headaches.       Right sided weakness due to MS  Psychiatric/Behavioral: Positive for memory loss. Negative for depression. The patient is nervous/anxious.      Immunization History  Administered Date(s)  Administered  . Influenza Inj Mdck Quad Pf 07/07/2016  . Influenza-Unspecified 07/02/2015  . Pneumococcal Conjugate-13 02/10/2015  . Pneumococcal Polysaccharide-23 10/23/2006  . Tdap 04/19/2005, 10/25/2016   Pertinent  Health Maintenance Due  Topic Date Due  . INFLUENZA VACCINE  04/19/2017  . PNA vac Low Risk Adult  Completed   Fall Risk  01/04/2017 07/04/2016 02/17/2016 08/23/2015 08/19/2015  Falls in the past year? No Yes No Yes Yes  Number falls in past yr: - 2 or more - 1 2 or more  Injury with Fall? - Yes - Yes Yes  Risk Factor Category  - High Fall Risk - High Fall Risk -  Risk for fall due to : - History of fall(s);Impaired balance/gait - History of fall(s);Impaired balance/gait;Impaired mobility;Medication side effect;Mental status change -  Follow up - Falls evaluation completed - Falls evaluation completed;Education provided;Falls prevention discussed -   Functional Status Survey:    Vitals:   03/30/17 1453  Weight: 178 lb 4.8 oz (80.9 kg)   Wt Readings from Last 3 Encounters:  03/30/17 178 lb 4.8 oz (80.9 kg)  03/07/17 169 lb (76.7 kg)  01/31/17 165 lb (74.8  kg)   Body mass index is 23.52 kg/m.  Physical Exam  Constitutional: No distress.  HENT:  Head: Normocephalic and atraumatic.  Neck: Normal range of motion. Neck supple. No JVD present. No tracheal deviation present. No thyromegaly present.  Cardiovascular: Normal rate and regular rhythm.   No murmur heard. Pulmonary/Chest: Effort normal and breath sounds normal. No respiratory distress. He has no wheezes.  Abdominal: Soft. Bowel sounds are normal. He exhibits no distension. There is no tenderness.  Musculoskeletal: He exhibits no edema or tenderness.  Decreased strength to RUE/RLE 3/5. Decreased ROM to neck.  Lymphadenopathy:    He has no cervical adenopathy.  Neurological: He is alert. No cranial nerve deficit.  Oriented to self and place, not time. Follows commands, pleasant  Skin: Skin is warm and dry. He is not diaphoretic.  Psychiatric: He has a normal mood and affect.    Labs reviewed:  Recent Labs  08/30/16 10/11/16 02/01/17 0600  NA 141 138 142  K 4.5 3.7 4.7  BUN 18 24* 21  CREATININE 0.7 0.8 0.6    Recent Labs  05/09/16 1102 02/01/17 0600  AST 19 17  ALT 17 14  ALKPHOS 73 73    Recent Labs  05/09/16 1102 10/11/16 02/01/17 0600  WBC 5.8 18.1 7.2  HGB 13.9 13.0* 14.1  HCT 39* 40* 43  PLT 231 165 151   Lab Results  Component Value Date   TSH 1.47 08/30/2016   Lab Results  Component Value Date   HGBA1C 5.7 (H) 01/13/2013   Lab Results  Component Value Date   CHOL 193 08/17/2016   HDL 46 08/17/2016   LDLCALC 133 08/17/2016   TRIG 86 08/17/2016   CHOLHDL 3.9 05/13/2014    Significant Diagnostic Results in last 30 days:  No results found.  Assessment/Plan   1. Allergic rhinitis, unspecified seasonality, unspecified trigger Improved  Continue zyrtec 10 mg qhs  2. Multiple sclerosis ([place]) Wheelchair bound, no longer followed by neurology Continue supportive care  3. Slow transit constipation Controlled Continue miralax 17 grams  qod  4. Dementia associated with other underlying disease with behavioral disturbance Controlled Continue Depakote 125 mg BID and prn ativan Monitor depakote levels, lfts and CBC periodically  5. Contracture of neck Encouraged staff to apply support when he is sleeping He works with a  trainer as well on ROM and flexibility  6. Right foot drop Continue brace to right foot  Family/ staff Communication: discussed with staff/resident  Labs/tests ordered: NA

## 2017-04-27 ENCOUNTER — Non-Acute Institutional Stay (SKILLED_NURSING_FACILITY): Payer: Medicare Other | Admitting: Adult Health

## 2017-04-27 ENCOUNTER — Encounter: Payer: Self-pay | Admitting: Adult Health

## 2017-04-27 DIAGNOSIS — G35D Multiple sclerosis, unspecified: Secondary | ICD-10-CM

## 2017-04-27 DIAGNOSIS — K5901 Slow transit constipation: Secondary | ICD-10-CM

## 2017-04-27 DIAGNOSIS — F0281 Dementia in other diseases classified elsewhere with behavioral disturbance: Secondary | ICD-10-CM | POA: Diagnosis not present

## 2017-04-27 DIAGNOSIS — F02818 Dementia in other diseases classified elsewhere, unspecified severity, with other behavioral disturbance: Secondary | ICD-10-CM

## 2017-04-27 DIAGNOSIS — G35 Multiple sclerosis: Secondary | ICD-10-CM | POA: Diagnosis not present

## 2017-04-27 DIAGNOSIS — F5101 Primary insomnia: Secondary | ICD-10-CM

## 2017-04-27 DIAGNOSIS — Z9359 Other cystostomy status: Secondary | ICD-10-CM

## 2017-04-27 DIAGNOSIS — G4700 Insomnia, unspecified: Secondary | ICD-10-CM | POA: Insufficient documentation

## 2017-04-27 NOTE — Progress Notes (Signed)
Patient ID: [name], male   DOB: 12/23/1940, 76 y.o.   MRN: [id number]   Location:  [place] of Service:  SNF (31) Provider:   Cindi Carbon, ANP [place] [phone]   [name], DO  Patient Care Team: [name], DO as PCP - General (Geriatric Medicine) [name], MD as Consulting Physician (Neurology) [name], MD as Consulting Physician (Urology) [name], MD as Consulting Physician (General Surgery) [name], MD as Consulting Physician (Orthopedic Surgery) [name], MD as Consulting Physician ([place] [facility]) [name], MD as Consulting Physician (Gastroenterology) [name], DO as Consulting Physician (Geriatric Medicine)  Extended Emergency Contact Information Primary Emergency Contact: [name] Address: [address]          [place], [place] [address] [name] of [place] Phone: [phone] Mobile Phone: [phone] Relation: Spouse  Code Status:  DNR Goals of care: Advanced Directive information Advanced Directives 03/30/2017  Does Patient Have a Medical Advance Directive? Yes  Type of Advance Directive Out of facility DNR (pink MOST or yellow form);[place];Living will  Does patient want to make changes to medical advance directive? -  Copy of [place] in Chart? Yes  Would patient like information on creating a medical advance directive? -  Pre-existing out of facility DNR order (yellow form or pink MOST form) -     Chief Complaint  Patient presents with  . Medical Management of Chronic Issues    HPI:  Pt is a 76 y.o. male seen for management of chronic diseases.   He has a hx of MS and dementia, residing in skilled care at [facility].   Insomnia: he has not been sleeping well lately, only a few hrs a night Later he is sleepy during the day  Suprapubic catheter: due to  bladder spasm/urinary retention  He has issues with behavioral disturbances and has used ativan 2 times in two weeks. Overall he has less agitation now that he has caregivers with him more regularly 1:1  On Namenda for dementia related to [place], last MMSE 13/30 01/04/17 which is a significant drop from his previous score of 22 in 2017.    MS: progressive functional losses.  He is non ambulatory and uses a stand up lift. He is weaker on the right side and has right foot drop. The staff is having a hard time using the lift and therefore PT is following him    Constipation: regular BMs with miralax and prn colace        Past Medical History:  Diagnosis Date  . Adenomatous polyp   . Allergy   . BPH (benign prostatic hyperplasia)   . COPD (chronic obstructive pulmonary disease) ([place])   . Dementia 2010  . Elevated PSA 07/31/2014  . Foot drop, right 2008  . 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 Generalized weakness 01/12/2013  . GERD (gastroesophageal reflux disease)   . Hyperlipidemia 07/31/2014  . Multiple sclerosis ([place]) 2008  . Prostatitis, chronic    Past Surgical History:  Procedure Laterality Date  . CATARACT EXTRACTION Left 02/06/12  . CATARACT EXTRACTION Right 2013  . ESOPHAGOGASTRODUODENOSCOPY ENDOSCOPY  04/16/2002   Dr. Earle Gell  . HERNIA REPAIR  1980'2000   3 inguinal hernia repairs on left  . HERNIA REPAIR  06/16/11   right inguinal hernia repair with mesh   . PROSTATE SURGERY  09/29/2008   Dr. Karsten Ro  Allergies  Allergen Reactions  . Aricept [Donepezil Hcl] Other (See Comments)    Reaction:  Unknown   . Dimethyl Fumarate Nausea And Vomiting    Allergies as of 04/27/2017      Reactions   Aricept [donepezil Hcl] Other (See Comments)   Reaction:  Unknown    Dimethyl Fumarate Nausea And Vomiting      Medication List       Accurate as of 04/27/17  4:19 PM. Always use your most recent med list.          aspirin 81 MG chewable tablet Chew 81 mg by mouth  daily.   Biotin 5 MG Tabs Take 5 mg by mouth daily.   cetirizine 10 MG tablet Commonly known as:  ZYRTEC Take 10 mg by mouth at bedtime.   cholecalciferol 1000 units tablet Commonly known as:  VITAMIN D Take 2,000 Units by mouth daily.   ciclopirox 0.77 % cream Commonly known as:  LOPROX Apply 1 application topically 2 (two) times daily. Pt applies to face.   divalproex 125 MG capsule Commonly known as:  DEPAKOTE SPRINKLE Take 125 mg by mouth 2 (two) times daily.   docusate sodium 100 MG capsule Commonly known as:  COLACE Take 100 mg by mouth at bedtime as needed for mild constipation or moderate constipation.   ketoconazole 2 % shampoo Commonly known as:  NIZORAL Apply 1 application topically 2 (two) times a week. Pt uses on Tuesday and Friday.   loperamide 2 MG capsule Commonly known as:  IMODIUM Take 4 mg by mouth as needed for diarrhea or loose stools (at onset of loose stool and 1 tab (2mg ) after each sucessive loose stool).   LORazepam 0.5 MG tablet Commonly known as:  ATIVAN Take 0.5 mg by mouth every 8 (eight) hours as needed for anxiety.   magnesium hydroxide 400 MG/5ML suspension Commonly known as:  MILK OF MAGNESIA Take 15 mLs by mouth daily as needed for mild constipation or moderate constipation.   memantine 10 MG tablet Commonly known as:  NAMENDA Take 10 mg by mouth 2 (two) times daily.   naproxen sodium 220 MG tablet Commonly known as:  ANAPROX Take 220 mg by mouth every 8 (eight) hours as needed (for discomfort).   polyethylene glycol packet Commonly known as:  MIRALAX / GLYCOLAX Take 17 g by mouth every other day.   sertraline 100 MG tablet Commonly known as:  ZOLOFT Take 100 mg by mouth daily.   solifenacin 10 MG tablet Commonly known as:  VESICARE Take 10 mg by mouth daily.      Review of Systems  Constitutional: Negative for chills, diaphoresis, fever, malaise/fatigue and weight loss.  Respiratory: Negative for cough, hemoptysis,  sputum production and wheezing.   Cardiovascular: Negative for chest pain, leg swelling and PND.  Gastrointestinal: Negative for abdominal pain, constipation, diarrhea, nausea and vomiting.  Genitourinary: Positive for dysuria, frequency and urgency. Negative for flank pain and hematuria.       Chronic bladder issues due to MS  Musculoskeletal: Positive for falls. Negative for back pain, joint pain, myalgias and neck pain.  Skin: Negative for itching and rash.  Neurological: Positive for focal weakness (right foot drop) and weakness. Negative for dizziness, tremors, seizures and headaches.       Right sided weakness due to MS  Psychiatric/Behavioral: Positive for memory loss. Negative for depression. The patient is nervous/anxious.      Immunization History  Administered Date(s) Administered  . Influenza Inj Mdck  Quad Pf 07/07/2016  . Influenza-Unspecified 07/02/2015  . Pneumococcal Conjugate-13 02/10/2015  . Pneumococcal Polysaccharide-23 10/23/2006  . Tdap 04/19/2005, 10/25/2016   Pertinent  Health Maintenance Due  Topic Date Due  . INFLUENZA VACCINE  04/19/2017  . PNA vac Low Risk Adult  Completed   Fall Risk  01/04/2017 07/04/2016 02/17/2016 08/23/2015 08/19/2015  Falls in the past year? No Yes No Yes Yes  Number falls in past yr: - 2 or more - 1 2 or more  Injury with Fall? - Yes - Yes Yes  Comment - - - - had sutures  Risk Factor Category  - High Fall Risk - High Fall Risk -  Risk for fall due to : - History of fall(s);Impaired balance/gait - History of fall(s);Impaired balance/gait;Impaired mobility;Medication side effect;Mental status change -  Follow up - Falls evaluation completed - Falls evaluation completed;Education provided;Falls prevention discussed -   Functional Status Survey:    Vitals:   04/27/17 1617  BP: 114/67  Pulse: 69  Resp: 16  Temp: 97.6 F (36.4 C)  SpO2: 96%  Weight: 181 lb (82.1 kg)   Wt Readings from Last 3 Encounters:  04/27/17 181 lb  (82.1 kg)  03/30/17 178 lb 4.8 oz (80.9 kg)  03/07/17 169 lb (76.7 kg)   Body mass index is 23.88 kg/m.  Physical Exam  Constitutional: No distress.  HENT:  Head: Normocephalic and atraumatic.  Neck: Normal range of motion. Neck supple. No JVD present. No tracheal deviation present. No thyromegaly present.  Cardiovascular: Normal rate and regular rhythm.   No murmur heard. Pulmonary/Chest: Effort normal and breath sounds normal. No respiratory distress. He has no wheezes.  Abdominal: Soft. Bowel sounds are normal. He exhibits no distension. There is no tenderness.  Musculoskeletal: He exhibits no edema or tenderness.  Decreased strength to RUE/RLE 3/5. Decreased ROM to neck.  Lymphadenopathy:    He has no cervical adenopathy.  Neurological: He is alert. No cranial nerve deficit.  Oriented to self and place, not time. Follows commands, pleasant  Skin: Skin is warm and dry. He is not diaphoretic.  Psychiatric: He has a normal mood and affect.    Labs reviewed:  Recent Labs  08/30/16 10/11/16 02/01/17 0600  NA 141 138 142  K 4.5 3.7 4.7  BUN 18 24* 21  CREATININE 0.7 0.8 0.6    Recent Labs  05/09/16 1102 02/01/17 0600  AST 19 17  ALT 17 14  ALKPHOS 73 73    Recent Labs  05/09/16 1102 10/11/16 02/01/17 0600  WBC 5.8 18.1 7.2  HGB 13.9 13.0* 14.1  HCT 39* 40* 43  PLT 231 165 151   Lab Results  Component Value Date   TSH 1.47 08/30/2016   Lab Results  Component Value Date   HGBA1C 5.7 (H) 01/13/2013   Lab Results  Component Value Date   CHOL 193 08/17/2016   HDL 46 08/17/2016   LDLCALC 133 08/17/2016   TRIG 86 08/17/2016   CHOLHDL 3.9 05/13/2014    Significant Diagnostic Results in last 30 days:  No results found.  Assessment/Plan  1. Multiple sclerosis ([facility]) Progressive weakness  Now having difficulty using the stand up lift PT to eval/tx  2. Suprapubic catheter ([place]) Due to bladder spasms/retention due to MS Continue catheter care per  facility protocol  3. 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 Constipation No new issues Continue miralax qod Continue prn colace  Family/ staff Communication: discussed with staff/resident  Labs/tests ordered: NA

## 2017-05-01 DIAGNOSIS — M62561 Muscle wasting and atrophy, not elsewhere classified, right lower leg: Secondary | ICD-10-CM | POA: Diagnosis not present

## 2017-05-01 DIAGNOSIS — M62562 Muscle wasting and atrophy, not elsewhere classified, left lower leg: Secondary | ICD-10-CM | POA: Diagnosis not present

## 2017-05-01 DIAGNOSIS — F0281 Dementia in other diseases classified elsewhere with behavioral disturbance: Secondary | ICD-10-CM | POA: Diagnosis not present

## 2017-05-01 DIAGNOSIS — G35 Multiple sclerosis: Secondary | ICD-10-CM | POA: Diagnosis not present

## 2017-05-01 DIAGNOSIS — R5381 Other malaise: Secondary | ICD-10-CM | POA: Diagnosis not present

## 2017-05-01 DIAGNOSIS — R278 Other lack of coordination: Secondary | ICD-10-CM | POA: Diagnosis not present

## 2017-05-02 DIAGNOSIS — R5381 Other malaise: Secondary | ICD-10-CM | POA: Diagnosis not present

## 2017-05-02 DIAGNOSIS — M62561 Muscle wasting and atrophy, not elsewhere classified, right lower leg: Secondary | ICD-10-CM | POA: Diagnosis not present

## 2017-05-02 DIAGNOSIS — G35 Multiple sclerosis: Secondary | ICD-10-CM | POA: Diagnosis not present

## 2017-05-02 DIAGNOSIS — M62562 Muscle wasting and atrophy, not elsewhere classified, left lower leg: Secondary | ICD-10-CM | POA: Diagnosis not present

## 2017-05-02 DIAGNOSIS — F0281 Dementia in other diseases classified elsewhere with behavioral disturbance: Secondary | ICD-10-CM | POA: Diagnosis not present

## 2017-05-02 DIAGNOSIS — R278 Other lack of coordination: Secondary | ICD-10-CM | POA: Diagnosis not present

## 2017-05-08 DIAGNOSIS — M62562 Muscle wasting and atrophy, not elsewhere classified, left lower leg: Secondary | ICD-10-CM | POA: Diagnosis not present

## 2017-05-08 DIAGNOSIS — R5381 Other malaise: Secondary | ICD-10-CM | POA: Diagnosis not present

## 2017-05-08 DIAGNOSIS — M62561 Muscle wasting and atrophy, not elsewhere classified, right lower leg: Secondary | ICD-10-CM | POA: Diagnosis not present

## 2017-05-08 DIAGNOSIS — F0281 Dementia in other diseases classified elsewhere with behavioral disturbance: Secondary | ICD-10-CM | POA: Diagnosis not present

## 2017-05-08 DIAGNOSIS — G35 Multiple sclerosis: Secondary | ICD-10-CM | POA: Diagnosis not present

## 2017-05-08 DIAGNOSIS — R278 Other lack of coordination: Secondary | ICD-10-CM | POA: Diagnosis not present

## 2017-05-30 ENCOUNTER — Encounter: Payer: Self-pay | Admitting: Internal Medicine

## 2017-05-30 ENCOUNTER — Non-Acute Institutional Stay (SKILLED_NURSING_FACILITY): Payer: Medicare Other | Admitting: Internal Medicine

## 2017-05-30 DIAGNOSIS — F5101 Primary insomnia: Secondary | ICD-10-CM

## 2017-05-30 DIAGNOSIS — K5909 Other constipation: Secondary | ICD-10-CM | POA: Diagnosis not present

## 2017-05-30 DIAGNOSIS — F02818 Dementia in other diseases classified elsewhere, unspecified severity, with other behavioral disturbance: Secondary | ICD-10-CM

## 2017-05-30 DIAGNOSIS — R269 Unspecified abnormalities of gait and mobility: Secondary | ICD-10-CM

## 2017-05-30 DIAGNOSIS — F0281 Dementia in other diseases classified elsewhere with behavioral disturbance: Secondary | ICD-10-CM | POA: Diagnosis not present

## 2017-05-30 DIAGNOSIS — G35 Multiple sclerosis: Secondary | ICD-10-CM

## 2017-05-30 DIAGNOSIS — N319 Neuromuscular dysfunction of bladder, unspecified: Secondary | ICD-10-CM | POA: Diagnosis not present

## 2017-05-30 NOTE — Progress Notes (Signed)
Location:   [name]  Place of Service:    SNF Provider:  Treyshon Buchanon L. Mariea Clonts, D.O., C.M.D.  [name], DO  Patient Care Team: [name], DO as PCP - General (Geriatric Medicine) [name], MD as Consulting Physician (Neurology) [name], MD as Consulting Physician (Urology) [name], MD as Consulting Physician (General Surgery) [name], MD as Consulting Physician (Orthopedic Surgery) [name], MD as Consulting Physician ([facility]) [name], MD as Consulting Physician (Gastroenterology) [name], DO as Consulting Physician (Geriatric Medicine)  Extended Emergency Contact Information Primary Emergency Contact: Moncada,Peggy Address: [address]          [place], [place] [address] [name] of [place] Phone: [phone] Mobile Phone: [phone] Relation: Spouse  Code Status:  DNR Goals of care: Advanced Directive information Advanced Directives 03/30/2017  Does Patient Have a Medical Advance Directive? Yes  Type of Advance Directive Out of facility DNR (pink MOST or yellow form);[place];Living will  Does patient want to make changes to medical advance directive? -  Copy of [place] in Chart? Yes  Would patient like information on creating a medical advance directive? -  Pre-existing out of facility DNR order (yellow form or pink MOST form) -    HPI:  Pt is a 76 y.o. male seen today for medical management of chronic diseases.  HX of MS and dementia with behavioral disturbances. He is a full assist with lift, specialized wheelchair and supportive devices.   Dementia with behavioral disturbances: Staff reports that he is not puling at his catheter as much and his yelling is less as long as he is with people. Has 1:1 caregiver. Has ativan PRN for behaviors as well but has not needed it as much with 2 doses in the last 14 days. On Namenda, zoloft and depakote.  Last MMSE 13/30 on 01/04/17.   MS: Progressive functional loss. Leans to his left side and drops head and neck forward without his brace. Has foot drop on right.  Insomnia: Staff reports that he has been sleeping 6-7 hours lately. Melatonin is scheduled hs.  Suprapubic catheter for urinary retention and bladder spasm with no issues.   Constipation: Regular BMs with miralax and prn colace.   Past Medical History:  Diagnosis Date  . Adenomatous polyp   . Allergy   . BPH (benign prostatic hyperplasia)   . COPD (chronic obstructive pulmonary disease) ([place])   . Dementia 2010  . Elevated PSA 07/31/2014  . Foot drop, right 2008  . 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 Generalized weakness 01/12/2013  . GERD (gastroesophageal reflux disease)   . Hyperlipidemia 07/31/2014  . Multiple sclerosis ([place]) 2008  . Prostatitis, chronic    Past Surgical History:  Procedure Laterality Date  . CATARACT EXTRACTION Left 02/06/12  . CATARACT EXTRACTION Right 2013  . ESOPHAGOGASTRODUODENOSCOPY ENDOSCOPY  04/16/2002   Dr. Earle Gell  . HERNIA REPAIR  1980'2000   3 inguinal hernia repairs on left  . HERNIA REPAIR  06/16/11   right inguinal hernia repair with mesh   . PROSTATE SURGERY  09/29/2008   Dr. Karsten Ro     Allergies  Allergen Reactions  . Aricept [Donepezil Hcl] Other (See Comments)    Reaction:  Unknown   . Dimethyl Fumarate Nausea And Vomiting    Allergies as of 05/30/2017      Reactions   Aricept [donepezil Hcl] Other (See Comments)   Reaction:  Unknown  Dimethyl Fumarate Nausea And Vomiting      Medication List       Accurate as of 05/30/17 12:10 PM. Always use your most recent med list.          aspirin 81 MG chewable tablet Chew 81 mg by mouth daily.   Biotin 5 MG Tabs Take 5 mg by mouth daily.   cetirizine 10 MG tablet Commonly known as:  ZYRTEC Take 10 mg by mouth at bedtime.   cholecalciferol 1000 units tablet Commonly known as:  VITAMIN D Take 2,000 Units by mouth  daily.   ciclopirox 0.77 % cream Commonly known as:  LOPROX Apply 1 application topically 2 (two) times daily. Pt applies to face.   divalproex 125 MG capsule Commonly known as:  DEPAKOTE SPRINKLE Take 125 mg by mouth 2 (two) times daily.   docusate sodium 100 MG capsule Commonly known as:  COLACE Take 100 mg by mouth at bedtime as needed for mild constipation or moderate constipation.   ketoconazole 2 % shampoo Commonly known as:  NIZORAL Apply 1 application topically 2 (two) times a week. Pt uses on Tuesday and Friday.   loperamide 2 MG capsule Commonly known as:  IMODIUM Take 4 mg by mouth as needed for diarrhea or loose stools (at onset of loose stool and 1 tab (2mg ) after each sucessive loose stool).   LORazepam 0.5 MG tablet Commonly known as:  ATIVAN Take 0.5 mg by mouth every 8 (eight) hours as needed for anxiety.   magnesium hydroxide 400 MG/5ML suspension Commonly known as:  MILK OF MAGNESIA Take 15 mLs by mouth daily as needed for mild constipation or moderate constipation.   memantine 10 MG tablet Commonly known as:  NAMENDA Take 10 mg by mouth 2 (two) times daily.   naproxen sodium 220 MG tablet Commonly known as:  ANAPROX Take 220 mg by mouth every 8 (eight) hours as needed (for discomfort).   polyethylene glycol packet Commonly known as:  MIRALAX / GLYCOLAX Take 17 g by mouth every other day.   sertraline 100 MG tablet Commonly known as:  ZOLOFT Take 100 mg by mouth daily.   solifenacin 10 MG tablet Commonly known as:  VESICARE Take 10 mg by mouth daily.       Review of Systems  Constitutional: Negative for chills and fever.  HENT: Negative.   Eyes: Negative for blurred vision.       Glasses  Respiratory: Negative for cough, shortness of breath and wheezing.   Cardiovascular: Negative for chest pain, palpitations and leg swelling.  Gastrointestinal: Negative for abdominal pain and constipation.  Genitourinary: Negative for frequency and  urgency.       Suprapubic cath  Musculoskeletal: Positive for neck pain.  Skin: Negative for itching and rash.  Neurological: Positive for tremors and weakness. Negative for dizziness, sensory change, loss of consciousness and headaches.  Endo/Heme/Allergies: Does not bruise/bleed easily.  Psychiatric/Behavioral: Positive for depression and memory loss. The patient is nervous/anxious.     Immunization History  Administered Date(s) Administered  . Influenza Inj Mdck Quad Pf 07/07/2016  . Influenza-Unspecified 07/02/2015  . Pneumococcal Conjugate-13 02/10/2015  . Pneumococcal Polysaccharide-23 10/23/2006  . Tdap 04/19/2005, 10/25/2016   Pertinent  Health Maintenance Due  Topic Date Due  . INFLUENZA VACCINE  04/19/2017  . PNA vac Low Risk Adult  Completed   Fall Risk  01/04/2017 07/04/2016 02/17/2016 08/23/2015 08/19/2015  Falls in the past year? No Yes No Yes Yes  Number falls in past  yr: - 2 or more - 1 2 or more  Injury with Fall? - Yes - Yes Yes  Comment - - - - had sutures  Risk Factor Category  - High Fall Risk - High Fall Risk -  Risk for fall due to : - History of fall(s);Impaired balance/gait - History of fall(s);Impaired balance/gait;Impaired mobility;Medication side effect;Mental status change -  Follow up - Falls evaluation completed - Falls evaluation completed;Education provided;Falls prevention discussed -   Functional Status Survey:    There were no vitals filed for this visit. There is no height or weight on file to calculate BMI. Physical Exam  Constitutional: He appears well-developed and well-nourished.  HENT:  Head: Normocephalic.  Eyes: EOM are normal.  glasses  Cardiovascular: Normal rate, regular rhythm, normal heart sounds and intact distal pulses.   Pulmonary/Chest: Effort normal and breath sounds normal.  Abdominal: Soft. Bowel sounds are normal.  Musculoskeletal: He exhibits deformity.  Foot drop  Neurological: He is alert.  Skin: Skin is warm and  dry. Capillary refill takes less than 2 seconds.  Psychiatric: His affect is blunt. His speech is slurred. He is slowed. Cognition and memory are impaired. He expresses impulsivity. He exhibits abnormal recent memory.    Labs reviewed:  Recent Labs  08/30/16 10/11/16 02/01/17 0600  NA 141 138 142  K 4.5 3.7 4.7  BUN 18 24* 21  CREATININE 0.7 0.8 0.6    Recent Labs  02/01/17 0600  AST 17  ALT 14  ALKPHOS 73    Recent Labs  10/11/16 02/01/17 0600  WBC 18.1 7.2  HGB 13.0* 14.1  HCT 40* 43  PLT 165 151   Lab Results  Component Value Date   TSH 1.47 08/30/2016   Lab Results  Component Value Date   HGBA1C 5.7 (H) 01/13/2013   Lab Results  Component Value Date   CHOL 193 08/17/2016   HDL 46 08/17/2016   LDLCALC 133 08/17/2016   TRIG 86 08/17/2016   CHOLHDL 3.9 05/13/2014    Significant Diagnostic Results in last 30 days:  No results found.  Assessment/Plan 1. Multiple sclerosis ([place]) Continue supportive care and use of braces  2. Dementia associated with other underlying disease with behavioral disturbance Continue Namenda, Zoloft, depakote and ativan prn, continue 1:1 caregiver which has been the biggest help with his behavior  3. Gait disorder Cont Wheelchair with lift  4. Primary insomnia Doing well with melatonin so continue same   5. Neurogenic bladder -cont suprapubic catheter, vesicare for bladder spasms  6. Chronic constipation -cont bowel regimen with miralax qod, prn daily MOM, and hs prn colace  Family/ staff Communication: Communicated with SNF nurse  Labs/tests ordered: None   [name], D.O. [facility] [address], [place] [address] Cell Phone (Mon-Fri 8am-5pm):  [phone] On Call:  [phone] & follow prompts after 5pm & weekends Office Phone:  [phone] Office Fax:  [phone]

## 2017-06-26 ENCOUNTER — Non-Acute Institutional Stay (SKILLED_NURSING_FACILITY): Payer: Medicare Other | Admitting: Adult Health

## 2017-06-26 DIAGNOSIS — J309 Allergic rhinitis, unspecified: Secondary | ICD-10-CM

## 2017-06-26 DIAGNOSIS — G35 Multiple sclerosis: Secondary | ICD-10-CM

## 2017-06-26 DIAGNOSIS — R531 Weakness: Secondary | ICD-10-CM

## 2017-06-26 DIAGNOSIS — F5101 Primary insomnia: Secondary | ICD-10-CM | POA: Diagnosis not present

## 2017-06-26 DIAGNOSIS — F0281 Dementia in other diseases classified elsewhere with behavioral disturbance: Secondary | ICD-10-CM | POA: Diagnosis not present

## 2017-06-26 DIAGNOSIS — F02818 Dementia in other diseases classified elsewhere, unspecified severity, with other behavioral disturbance: Secondary | ICD-10-CM

## 2017-06-26 DIAGNOSIS — L219 Seborrheic dermatitis, unspecified: Secondary | ICD-10-CM

## 2017-06-26 NOTE — Progress Notes (Signed)
Location:  [facility]:  SNF (31) Provider:   Cindi Carbon, ANP [place] [phone]   [name], DO  Patient Care Team: [name], DO as PCP - General (Geriatric Medicine) [name], MD as Consulting Physician (Neurology) [name], MD as Consulting Physician (Urology) [name], MD as Consulting Physician (General Surgery) [name], MD as Consulting Physician (Orthopedic Surgery) [name], MD as Consulting Physician ([facility]) [name], MD as Consulting Physician (Gastroenterology) [name], DO as Consulting Physician (Geriatric Medicine)  Extended Emergency Contact Information Primary Emergency Contact: Kissick,Peggy Address: [address]          [place], [place] [address] [name] of [place] Phone: [phone] Mobile Phone: [phone] Relation: Spouse  Code Status:  DNR Goals of care: Advanced Directive information Advanced Directives 06/28/2017  Does Patient Have a Medical Advance Directive? Yes  Type of Advance Directive Out of facility DNR (pink MOST or yellow form);Living will;Healthcare Power of Attorney  Does patient want to make changes to medical advance directive? -  Copy of [place] in Chart? Yes  Would patient like information on creating a medical advance directive? -  Pre-existing out of facility DNR order (yellow form or pink MOST form) Pink MOST form placed in chart (order not valid for inpatient use);Yellow form placed in chart (order not valid for inpatient use)    HPI:  Pt is a 76 y.o. male seen today for medical management of chronic diseases.  He has a hx of MS and dementia with behaviors. She resides in skilled care and is non ambulatory and dependent on staff for all ADLs.    Dementia with behavioral disturbances: Improved behaviors over time due to medication and 1:1 caregiver.  His wife has  reported that he is too sleepy during the day. The staff does not concur. On Namenda, zoloft and depakote. Last MMSE 13/30 on 01/04/17.   MS: Progressive functional losses noted. He has always been weaker on the right and now we noticed that he has minimal use over time to the right arm and increasing edema on that side of his body due to disuse. He has foot drop and uses a brace.    Insomnia: staff reports that he sleeps well and uses melatonin  Atopic issues with his face/eyes that were itchy and dry/red. These symptoms have improved. He also has seborrhea that is significant on his face which does not currently bother him but is red and scaly.  He uses ciclopirox cream.  Past Medical History:  Diagnosis Date  . Adenomatous polyp   . Allergy   . BPH (benign prostatic hyperplasia)   . COPD (chronic obstructive pulmonary disease) ([place])   . Dementia 2010  . Elevated PSA 07/31/2014  . Foot drop, right 2008  . 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 Generalized weakness 01/12/2013  . GERD (gastroesophageal reflux disease)   . Hyperlipidemia 07/31/2014  . Multiple sclerosis ([place]) 2008  . Prostatitis, chronic    Past Surgical History:  Procedure Laterality Date  . CATARACT EXTRACTION Left 02/06/12  . CATARACT EXTRACTION Right 2013  . ESOPHAGOGASTRODUODENOSCOPY ENDOSCOPY  04/16/2002   Dr. Earle Gell  . HERNIA REPAIR  1980'2000   3 inguinal hernia repairs on left  . HERNIA REPAIR  06/16/11   right inguinal hernia repair with mesh   . PROSTATE SURGERY  09/29/2008   Dr. Karsten Ro     Allergies  Allergen Reactions  . Aricept [Donepezil Hcl] Other (See Comments)    Reaction:  Unknown   . Dimethyl Fumarate Nausea And Vomiting    Allergies as of 06/26/2017      Reactions   Aricept [donepezil Hcl] Other (See Comments)   Reaction:  Unknown    Dimethyl Fumarate Nausea And Vomiting      Medication List       Accurate as of 06/26/17 11:59 PM. Always use your most recent med list.            aspirin 81 MG chewable tablet Chew 81 mg by mouth daily.   Biotin 5 MG Tabs Take 5 mg by mouth daily.   cetirizine 10 MG tablet Commonly known as:  ZYRTEC Take 10 mg by mouth at bedtime.   cholecalciferol 1000 units tablet Commonly known as:  VITAMIN D Take 2,000 Units by mouth daily.   ciclopirox 0.77 % cream Commonly known as:  LOPROX Apply 1 application topically 2 (two) times daily. Pt applies to face.   divalproex 125 MG capsule Commonly known as:  DEPAKOTE SPRINKLE Take 125 mg by mouth 2 (two) times daily.   docusate sodium 100 MG capsule Commonly known as:  COLACE Take 100 mg by mouth at bedtime as needed for mild constipation or moderate constipation.   ketoconazole 2 % shampoo Commonly known as:  NIZORAL Apply 1 application topically 2 (two) times a week. Pt uses on Tuesday and Friday.   loperamide 2 MG capsule Commonly known as:  IMODIUM Take 4 mg by mouth as needed for diarrhea or loose stools (at onset of loose stool and 1 tab (2mg ) after each sucessive loose stool).   LORazepam 0.5 MG tablet Commonly known as:  ATIVAN Take 0.5 mg by mouth every 8 (eight) hours as needed for anxiety.   magnesium hydroxide 400 MG/5ML suspension Commonly known as:  MILK OF MAGNESIA Take 15 mLs by mouth daily as needed for mild constipation or moderate constipation.   Melatonin 10 MG Tabs Take 1 tablet by mouth at bedtime.   memantine 10 MG tablet Commonly known as:  NAMENDA Take 10 mg by mouth 2 (two) times daily.   naproxen sodium 220 MG tablet Commonly known as:  ANAPROX Take 220 mg by mouth every 8 (eight) hours as needed (for discomfort).   polyethylene glycol packet Commonly known as:  MIRALAX / GLYCOLAX Take 17 g by mouth every other day.   sertraline 100 MG tablet Commonly known as:  ZOLOFT Take 100 mg by mouth daily.   solifenacin 10 MG tablet Commonly known as:  VESICARE Take 10 mg by mouth daily.      Review of Systems  Constitutional: Negative  for chills, diaphoresis, fever, malaise/fatigue and weight loss.  Respiratory: Negative for cough, hemoptysis, sputum production and wheezing.   Cardiovascular: Positive for leg swelling. Negative for chest pain and PND.  Gastrointestinal: Negative for abdominal pain, constipation, diarrhea, nausea and vomiting.  Genitourinary: Positive for urgency. Negative for dysuria.  Musculoskeletal: Negative for back pain, falls, joint pain, myalgias and neck pain.  Skin: Positive for rash. Negative for itching.  Neurological: Positive for focal weakness. Negative for dizziness, tingling, tremors, speech change, seizures, loss of consciousness, weakness and headaches.  Psychiatric/Behavioral: Positive for memory loss. Negative for depression. The patient is nervous/anxious.      Immunization History  Administered Date(s) Administered  . Influenza Inj Mdck Quad Pf 07/07/2016  . Influenza-Unspecified 07/02/2015  . Pneumococcal Conjugate-13 02/10/2015  . Pneumococcal Polysaccharide-23  10/23/2006  . Tdap 04/19/2005, 10/25/2016   Pertinent  Health Maintenance Due  Topic Date Due  . INFLUENZA VACCINE  04/19/2017  . PNA vac Low Risk Adult  Completed   Fall Risk  01/04/2017 07/04/2016 02/17/2016 08/23/2015 08/19/2015  Falls in the past year? No Yes No Yes Yes  Number falls in past yr: - 2 or more - 1 2 or more  Injury with Fall? - Yes - Yes Yes  Comment - - - - had sutures  Risk Factor Category  - High Fall Risk - High Fall Risk -  Risk for fall due to : - History of fall(s);Impaired balance/gait - History of fall(s);Impaired balance/gait;Impaired mobility;Medication side effect;Mental status change -  Follow up - Falls evaluation completed - Falls evaluation completed;Education provided;Falls prevention discussed -   Functional Status Survey:    Vitals:   06/27/17 0800  BP: 102/66  Pulse: 72  Resp: 18  Temp: 98.3 F (36.8 C)  SpO2: 92%  Weight: 179 lb 14.4 oz (81.6 kg)   Body mass index is  23.73 kg/m.  Physical Exam  Constitutional: No distress.  HENT:  Head: Normocephalic and atraumatic.  Neck: Normal range of motion. Neck supple. No JVD present. No tracheal deviation present. No thyromegaly present.  Cardiovascular: Normal rate and regular rhythm.   No murmur heard. Pulmonary/Chest: Effort normal and breath sounds normal. No respiratory distress. He has no wheezes.  Abdominal: Soft. Bowel sounds are normal. He exhibits no distension. There is no tenderness.  Musculoskeletal: He exhibits edema (right arm and leg. no redness or pain). He exhibits no tenderness.  Lymphadenopathy:    He has no cervical adenopathy.  Neurological: He is alert. No cranial nerve deficit.  Oriented to self and place only. Able to f/c.  Confused about the details of his care. No cranial nerve deficit but noted right sided weakness to his right arm and leg (chronic but worsening)  Skin: Skin is warm and dry. He is not diaphoretic. There is erythema (dry and scaly to nasal labial folds, chin and cheeks).  Psychiatric: He has a normal mood and affect.  Vitals reviewed.    Labs reviewed:  Recent Labs  08/30/16 10/11/16 02/01/17 0600  NA 141 138 142  K 4.5 3.7 4.7  BUN 18 24* 21  CREATININE 0.7 0.8 0.6    Recent Labs  02/01/17 0600  AST 17  ALT 14  ALKPHOS 73    Recent Labs  10/11/16 02/01/17 0600  WBC 18.1 7.2  HGB 13.0* 14.1  HCT 40* 43  PLT 165 151   Lab Results  Component Value Date   TSH 1.47 08/30/2016   Lab Results  Component Value Date   HGBA1C 5.7 (H) 01/13/2013   Lab Results  Component Value Date   CHOL 193 08/17/2016   HDL 46 08/17/2016   LDLCALC 133 08/17/2016   TRIG 86 08/17/2016   CHOLHDL 3.9 05/13/2014    Significant Diagnostic Results in last 30 days:  No results found.  Assessment/Plan  1. Multiple sclerosis ([place]) He is showing signs of progressive decline in functionality.   No longer on medication or followed by Neuro due to lack of  benefit. His goals of care are comfort based with no hospitalizations.   2. Right sided weakness Progressive over time, now non ambulatory with increasing assistance needed due to MS. Continue the right leg brace and elevation to the RUE.  3. Dementia associated with other underlying disease with behavioral disturbance Decrease Depakote to  125 mg qhs Continue Namenda   4. Allergic rhinitis, unspecified seasonality, unspecified trigger Continue Zyrtec 10 mg qhs for rhinitis and allergic conjunctivitis  5. Seborrheic dermatitis Continue ciclopirox cream, consider triamcinolone if worsens  6. Primary insomnia Improved sleep Continue melatonin 10 mg qhs   Family/ staff Communication: Communicated with SNF nurse  Labs/tests ordered:  Dep Level due next  month

## 2017-06-28 ENCOUNTER — Encounter: Payer: Self-pay | Admitting: Adult Health

## 2017-06-28 DIAGNOSIS — R531 Weakness: Secondary | ICD-10-CM | POA: Insufficient documentation

## 2017-07-04 DIAGNOSIS — L814 Other melanin hyperpigmentation: Secondary | ICD-10-CM | POA: Diagnosis not present

## 2017-07-04 DIAGNOSIS — D1801 Hemangioma of skin and subcutaneous tissue: Secondary | ICD-10-CM | POA: Diagnosis not present

## 2017-07-04 DIAGNOSIS — L821 Other seborrheic keratosis: Secondary | ICD-10-CM | POA: Diagnosis not present

## 2017-07-13 DIAGNOSIS — Z23 Encounter for immunization: Secondary | ICD-10-CM | POA: Diagnosis not present

## 2017-07-20 ENCOUNTER — Encounter: Payer: Self-pay | Admitting: Adult Health

## 2017-07-20 ENCOUNTER — Non-Acute Institutional Stay (SKILLED_NURSING_FACILITY): Payer: Medicare Other | Admitting: Adult Health

## 2017-07-20 DIAGNOSIS — R400 Somnolence: Secondary | ICD-10-CM | POA: Diagnosis not present

## 2017-07-20 DIAGNOSIS — N319 Neuromuscular dysfunction of bladder, unspecified: Secondary | ICD-10-CM

## 2017-07-20 DIAGNOSIS — R269 Unspecified abnormalities of gait and mobility: Secondary | ICD-10-CM

## 2017-07-20 DIAGNOSIS — L219 Seborrheic dermatitis, unspecified: Secondary | ICD-10-CM | POA: Diagnosis not present

## 2017-07-20 DIAGNOSIS — G35 Multiple sclerosis: Secondary | ICD-10-CM | POA: Diagnosis not present

## 2017-07-20 DIAGNOSIS — F5101 Primary insomnia: Secondary | ICD-10-CM | POA: Diagnosis not present

## 2017-07-20 DIAGNOSIS — K5901 Slow transit constipation: Secondary | ICD-10-CM

## 2017-07-20 DIAGNOSIS — F02818 Dementia in other diseases classified elsewhere, unspecified severity, with other behavioral disturbance: Secondary | ICD-10-CM

## 2017-07-20 DIAGNOSIS — F0281 Dementia in other diseases classified elsewhere with behavioral disturbance: Secondary | ICD-10-CM | POA: Diagnosis not present

## 2017-07-20 NOTE — Progress Notes (Signed)
Location:  [facility] of Service:  SNF (31) Provider:   Cindi Carbon, ANP [place] [phone]   [name], DO  Patient Care Team: [name], DO as PCP - General (Geriatric Medicine) [name], MD as Consulting Physician (Neurology) [name], MD as Consulting Physician (Urology) [name], MD as Consulting Physician (General Surgery) [name], MD as Consulting Physician (Orthopedic Surgery) [name], MD as Consulting Physician ([facility]) [name], MD as Consulting Physician (Gastroenterology) [name], DO as Consulting Physician (Geriatric Medicine)  Extended Emergency Contact Information Primary Emergency Contact: [name] Address: [address]          [place], [place] [address] [name] of [place] Phone: [phone] Mobile Phone: [phone] Relation: Spouse  Code Status:  DNR Goals of care: Advanced Directive information Advanced Directives 07/20/2017  Does Patient Have a Medical Advance Directive? Yes  Type of Advance Directive Out of facility DNR (pink MOST or yellow form);Living will;Healthcare Power of Attorney  Does patient want to make changes to medical advance directive? -  Copy of [place] in Chart? Yes  Would patient like information on creating a medical advance directive? -  Pre-existing out of facility DNR order (yellow form or pink MOST form) Pink MOST form placed in chart (order not valid for inpatient use);Yellow form placed in chart (order not valid for inpatient use)     Chief Complaint  Patient presents with  . Acute Visit    lethargy, vomiting  . Medical Management of Chronic Issues    HPI:  Pt is a 76 y.o. male seen today acutely due to lethargy and vomiting, as well as for medical management of chronic diseases.    The nurse reports that at 430 this morning he threw up brown liquid and since  then has been lethargic. He did not arouse this morning for breakfast and as of 11am has been sleeping. He has had a couple of bouts of hiccups. The sitter reports that he is starting to have a congested cough first noted today. His VS have been WNL.    Mr. 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 He is right handed and this has been difficult for him. He is a hoyer lift and wears a brace for right foot drop. He has dementia as well with behaviors and is on depakote at bedtime. This was reduced recently due to lethargy.   Past Medical History:  Diagnosis Date  . Adenomatous polyp   . Allergy   . BPH (benign prostatic hyperplasia)   . COPD (chronic obstructive pulmonary disease) ([place])   . Dementia 2010  . Elevated PSA 07/31/2014  . Foot drop, right 2008  . 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 Generalized weakness 01/12/2013  . GERD (gastroesophageal reflux disease)   . Hyperlipidemia 07/31/2014  . Multiple sclerosis ([place]) 2008  . Prostatitis, chronic    Past Surgical History:  Procedure Laterality Date  . CATARACT EXTRACTION Left 02/06/12  . CATARACT EXTRACTION Right 2013  . ESOPHAGOGASTRODUODENOSCOPY ENDOSCOPY  04/16/2002   Dr. Earle Gell  . HERNIA REPAIR  1980'2000   3 inguinal hernia repairs on left  . HERNIA REPAIR  06/16/11   right inguinal hernia repair with mesh   . PROSTATE SURGERY  09/29/2008   Dr. Karsten Ro     Allergies  Allergen Reactions  . Aricept [Donepezil Hcl] Other (See Comments)  Reaction:  Unknown   . Dimethyl Fumarate Nausea And Vomiting    Outpatient Encounter Prescriptions as of 07/20/2017  Medication Sig  . aspirin 81 MG chewable tablet Chew 81 mg by mouth daily.  . Biotin 5 MG TABS Take 5 mg by mouth daily.   . cetirizine (ZYRTEC) 10 MG tablet Take 10 mg by mouth at bedtime.  . cholecalciferol (VITAMIN D) 1000 units tablet Take 2,000 Units by mouth daily.  . ciclopirox (LOPROX) 0.77 % cream Apply 1 application  topically 2 (two) times daily. Pt applies to face.  . divalproex (DEPAKOTE SPRINKLE) 125 MG capsule Take 125 mg by mouth 2 (two) times daily.   Marland Kitchen docusate sodium (COLACE) 100 MG capsule Take 100 mg by mouth at bedtime as needed for mild constipation or moderate constipation.   Marland Kitchen ketoconazole (NIZORAL) 2 % shampoo Apply 1 application topically 2 (two) times a week. Pt uses on Tuesday and Friday.  . loperamide (IMODIUM) 2 MG capsule Take 4 mg by mouth as needed for diarrhea or loose stools (at onset of loose stool and 1 tab (2mg ) after each sucessive loose stool).   . LORazepam (ATIVAN) 0.5 MG tablet Take 0.5 mg by mouth every 8 (eight) hours as needed for anxiety.   . magnesium hydroxide (MILK OF MAGNESIA) 400 MG/5ML suspension Take 15 mLs by mouth daily as needed for mild constipation or moderate constipation.   . Melatonin 10 MG TABS Take 1 tablet by mouth at bedtime.  . memantine (NAMENDA) 10 MG tablet Take 10 mg by mouth 2 (two) times daily.  . naproxen sodium (ANAPROX) 220 MG tablet Take 220 mg by mouth every 8 (eight) hours as needed (for discomfort).   . polyethylene glycol (MIRALAX / GLYCOLAX) packet Take 17 g by mouth every other day.  . sertraline (ZOLOFT) 100 MG tablet Take 100 mg by mouth daily.  . solifenacin (VESICARE) 10 MG tablet Take 10 mg by mouth daily.   No facility-administered encounter medications on file as of 07/20/2017.     Review of Systems  Unable to perform ROS: Dementia    Immunization History  Administered Date(s) Administered  . Influenza Inj Mdck Quad Pf 07/07/2016  . Influenza-Unspecified 07/02/2015  . Pneumococcal Conjugate-13 02/10/2015  . Pneumococcal Polysaccharide-23 10/23/2006  . Tdap 04/19/2005, 10/25/2016   Pertinent  Health Maintenance Due  Topic Date Due  . INFLUENZA VACCINE  04/19/2017  . PNA vac Low Risk Adult  Completed   Fall Risk  01/04/2017 07/04/2016 02/17/2016 08/23/2015 08/19/2015  Falls in the past year? No Yes No Yes Yes  Number  falls in past yr: - 2 or more - 1 2 or more  Injury with Fall? - Yes - Yes Yes  Comment - - - - had sutures  Risk Factor Category  - High Fall Risk - High Fall Risk -  Risk for fall due to : - History of fall(s);Impaired balance/gait - History of fall(s);Impaired balance/gait;Impaired mobility;Medication side effect;Mental status change -  Follow up - Falls evaluation completed - Falls evaluation completed;Education provided;Falls prevention discussed -   Functional Status Survey:    Vitals:   07/20/17 1113  BP: 105/68  Pulse: 81  Resp: 18  Temp: 98.6 F (37 C)  SpO2: 92%   There is no height or weight on file to calculate BMI. Physical Exam  Constitutional: No distress.  HENT:  Head: Normocephalic and atraumatic.  Left Ear: External ear normal.  Nose: Nose normal.  Mouth/Throat: Oropharynx is clear and moist.  No oropharyngeal exudate.  Cerumen impaction to right ear  Eyes: Pupils are equal, round, and reactive to light. Right eye exhibits no discharge. Left eye exhibits no discharge.  Erythema to both sclera  Neck: Normal range of motion. Neck supple. No JVD present. No tracheal deviation present. No thyromegaly present.  Cardiovascular: Normal rate and regular rhythm.   No murmur heard. Pulmonary/Chest: Effort normal and breath sounds normal. No respiratory distress. He has no wheezes.  Abdominal: Soft. Bowel sounds are normal. He exhibits no distension. There is no tenderness.  Musculoskeletal: He exhibits no edema or tenderness.  Edema to right hand and leg are noted which is mild.   Lymphadenopathy:    He has no cervical adenopathy.  Neurological:  Lethargic and only minimally responds verbally to questions after vigorous stimulus.   Skin: Skin is warm and dry. Rash (scaly erythematous rash to nasolabial folds, ears, chin) noted. He is not diaphoretic.    Labs reviewed:  Recent Labs  08/30/16 10/11/16 02/01/17 0600  NA 141 138 142  K 4.5 3.7 4.7  BUN 18 24* 21    CREATININE 0.7 0.8 0.6    Recent Labs  02/01/17 0600  AST 17  ALT 14  ALKPHOS 73    Recent Labs  10/11/16 02/01/17 0600  WBC 18.1 7.2  HGB 13.0* 14.1  HCT 40* 43  PLT 165 151   Lab Results  Component Value Date   TSH 1.47 08/30/2016   Lab Results  Component Value Date   HGBA1C 5.7 (H) 01/13/2013   Lab Results  Component Value Date   CHOL 193 08/17/2016   HDL 46 08/17/2016   LDLCALC 133 08/17/2016   TRIG 86 08/17/2016   CHOLHDL 3.9 05/13/2014    Significant Diagnostic Results in last 30 days:  No results found.  Assessment/Plan  1. Somnolence Unclear etiology I will discontinue the low dose depakote that is ordered but that is not the issue. He may have had an acute event which led to this condition. I spoke with his wife and given his poor quality of life and lack of discomfort we have elected not to treat him at this time. His goals of care are comfort based. He is a DNR and his most form indicates no hospitalizations. The staff was instructed to avoid feeding him until he was more awake.  He also has had difficulty sleeping and is not clear if he slept well last night. His symptoms of vomiting have subsided.   2. Multiple sclerosis ([place]) Progressive decline in function, specifically on the right side his body. No longer on medication due to lack of benefit.  3. Dementia associated with other underlying disease with behavioral disturbance Somnolent at this point but has progressive memory loss and agitation at times  4. Seborrheic dermatitis Continue nizoral shampoo and ciclopirox cream  5. Neurogenic bladder Continue suprapubic catheter with maintenance per [name]  6. 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 Slow transit constipation Continue colace and miralax if he wake up enough to swallow  8. Primary insomnia Previously had difficulty sleeping and now takes melatonin nightly. The nurse can hold  this medication until he is more alert.    Family/ staff Communication: discussed with his wife, Vickii Chafe  Labs/tests ordered:  No new labs    D/c depakote

## 2017-07-31 ENCOUNTER — Encounter: Payer: Self-pay | Admitting: Adult Health

## 2017-07-31 ENCOUNTER — Non-Acute Institutional Stay (SKILLED_NURSING_FACILITY): Payer: Medicare Other | Admitting: Adult Health

## 2017-07-31 DIAGNOSIS — R112 Nausea with vomiting, unspecified: Secondary | ICD-10-CM

## 2017-07-31 DIAGNOSIS — K219 Gastro-esophageal reflux disease without esophagitis: Secondary | ICD-10-CM | POA: Diagnosis not present

## 2017-07-31 DIAGNOSIS — K5901 Slow transit constipation: Secondary | ICD-10-CM | POA: Diagnosis not present

## 2017-07-31 DIAGNOSIS — R1111 Vomiting without nausea: Secondary | ICD-10-CM | POA: Diagnosis not present

## 2017-07-31 NOTE — Progress Notes (Signed)
Location:  Occupational psychologist of Service:  SNF (31) Provider:   Cindi Carbon, ANP [place] [phone]   [name], DO  Patient Care Team: [name], DO as PCP - General (Geriatric Medicine) [name], MD as Consulting Physician (Neurology) [name], MD as Consulting Physician (Urology) [name], MD as Consulting Physician (General Surgery) [name], MD as Consulting Physician (Orthopedic Surgery) [name], MD as Consulting Physician ([facility]) [name], MD as Consulting Physician (Gastroenterology) [name], DO as Consulting Physician (Geriatric Medicine)  Extended Emergency Contact Information Primary Emergency Contact: [name] Address: [address]          [place], [place] [address] [name] of [place] Phone: [phone] Mobile Phone: [phone] Relation: Spouse  Code Status:  DNR Goals of care: Advanced Directive information Advanced Directives 07/20/2017  Does Patient Have a Medical Advance Directive? Yes  Type of Advance Directive Out of facility DNR (pink MOST or yellow form);Living will;Healthcare Power of Attorney  Does patient want to make changes to medical advance directive? -  Copy of [place] in Chart? Yes  Would patient like information on creating a medical advance directive? -  Pre-existing out of facility DNR order (yellow form or pink MOST form) Pink MOST form placed in chart (order not valid for inpatient use);Yellow form placed in chart (order not valid for inpatient use)     Chief Complaint  Patient presents with  . Acute Visit    vomiting    HPI:  Pt is a 76 y.o. male seen today for an acute visit for vomiting. He was seen on 07/20/17 for vomiting as well which resolved after one dose of phenergan. There was concern for somnolence at that time but I was not aware that he had been given phenergan. This  am he vomited x 2 around 9am.  Earlier in the morning (2am) he was given milk of magnesia when he reported "upset stomach".  Apparently he had not had a BM since 11/9.  He has had a total of 3 doses of MOM for constipation or "upset stomach" in the past 12 days. He has been given one dose of Zofran on 11/1.  I do not see in the nsg notes where he has had further episodes of vomiting except on 11/1 and 07/31/2017  He has had one loose stool this morning after the MOM. He is sleeping with no further vomiting. He did not sleep well last night due to agitation. His depakote was discontinued on 11/1 due to increased somnolence which may have contributed to his agitation last night.  The staff also report a cough which was present on 11/1 as well. No temperature or sputum production. No SOB or decreased 0 2 sats.   In review of the record he had nausea and vomiting in 2003 and an upper GI was ordered which showed reflux and a small hiatal hernia. CT of the abd was also ordered at that time which was WNL.  Past Medical History:  Diagnosis Date  . Adenomatous polyp   . Allergy   . BPH (benign prostatic hyperplasia)   . COPD (chronic obstructive pulmonary disease) ([place])   . Dementia 2010  . Elevated PSA 07/31/2014  . Foot drop, right 2008  . 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 Generalized weakness 01/12/2013  . GERD (gastroesophageal reflux disease)   . Hyperlipidemia 07/31/2014  . Multiple sclerosis ([place]) 2008  .  Prostatitis, chronic    Past Surgical History:  Procedure Laterality Date  . CATARACT EXTRACTION Left 02/06/12  . CATARACT EXTRACTION Right 2013  . ESOPHAGOGASTRODUODENOSCOPY ENDOSCOPY  04/16/2002   Dr. Earle Gell  . HERNIA REPAIR  1980'2000   3 inguinal hernia repairs on left  . HERNIA REPAIR  06/16/11   right inguinal hernia repair with mesh   . PROSTATE SURGERY  09/29/2008   Dr. Karsten Ro     Allergies  Allergen Reactions  . Aricept [Donepezil Hcl] Other (See Comments)    Reaction:   Unknown   . Dimethyl Fumarate Nausea And Vomiting    Outpatient Encounter Medications as of 07/31/2017  Medication Sig  . aspirin 81 MG chewable tablet Chew 81 mg by mouth daily.  . Biotin 5 MG TABS Take 5 mg by mouth daily.   . cetirizine (ZYRTEC) 10 MG tablet Take 10 mg by mouth at bedtime.  . cholecalciferol (VITAMIN D) 1000 units tablet Take 2,000 Units by mouth daily.  . ciclopirox (LOPROX) 0.77 % cream Apply 1 application topically 2 (two) times daily. Pt applies to face.  . divalproex (DEPAKOTE SPRINKLE) 125 MG capsule Take 125 mg by mouth at bedtime.   . docusate sodium (COLACE) 100 MG capsule Take 100 mg by mouth at bedtime as needed for mild constipation or moderate constipation.   Marland Kitchen ketoconazole (NIZORAL) 2 % shampoo Apply 1 application topically 2 (two) times a week. Pt uses on Tuesday and Friday.  . loperamide (IMODIUM) 2 MG capsule Take 4 mg by mouth as needed for diarrhea or loose stools (at onset of loose stool and 1 tab (2mg ) after each sucessive loose stool).   . LORazepam (ATIVAN) 0.5 MG tablet Take 0.5 mg by mouth every 8 (eight) hours as needed for anxiety.   . magnesium hydroxide (MILK OF MAGNESIA) 400 MG/5ML suspension Take 15 mLs by mouth daily as needed for mild constipation or moderate constipation.   . Melatonin 10 MG TABS Take 1 tablet by mouth at bedtime.  . memantine (NAMENDA) 10 MG tablet Take 10 mg by mouth 2 (two) times daily.  . naproxen sodium (ANAPROX) 220 MG tablet Take 220 mg by mouth every 8 (eight) hours as needed (for discomfort).   . polyethylene glycol (MIRALAX / GLYCOLAX) packet Take 17 g by mouth every other day.  . sertraline (ZOLOFT) 100 MG tablet Take 100 mg by mouth daily.  . solifenacin (VESICARE) 10 MG tablet Take 10 mg by mouth daily.   No facility-administered encounter medications on file as of 07/31/2017.     Review of Systems  Unable to perform ROS: Dementia    Immunization History  Administered Date(s) Administered  . Influenza  Inj Mdck Quad Pf 07/07/2016  . Influenza-Unspecified 07/02/2015  . Pneumococcal Conjugate-13 02/10/2015  . Pneumococcal Polysaccharide-23 10/23/2006  . Tdap 04/19/2005, 10/25/2016   Pertinent  Health Maintenance Due  Topic Date Due  . INFLUENZA VACCINE  04/19/2017  . PNA vac Low Risk Adult  Completed   Fall Risk  01/04/2017 07/04/2016 02/17/2016 08/23/2015 08/19/2015  Falls in the past year? No Yes No Yes Yes  Number falls in past yr: - 2 or more - 1 2 or more  Injury with Fall? - Yes - Yes Yes  Comment - - - - had sutures  Risk Factor Category  - High Fall Risk - High Fall Risk -  Risk for fall due to : - History of fall(s);Impaired balance/gait - History of fall(s);Impaired balance/gait;Impaired mobility;Medication side effect;Mental status  change -  Follow up - Falls evaluation completed - Falls evaluation completed;Education provided;Falls prevention discussed -   Functional Status Survey:    Vitals:   07/31/17 1145  BP: 114/72  Pulse: 81  Resp: 18  Temp: 98.3 F (36.8 C)  SpO2: 92%   There is no height or weight on file to calculate BMI. Physical Exam  Constitutional: No distress.  HENT:  Head: Normocephalic and atraumatic.  Neck: Normal range of motion. Neck supple. No JVD present. No tracheal deviation present. No thyromegaly present.  Cardiovascular: Normal rate and regular rhythm.  No murmur heard. Pulmonary/Chest: Effort normal and breath sounds normal. No respiratory distress. He has no wheezes.  Abdominal: Soft. Bowel sounds are normal. He exhibits no distension. There is no tenderness. There is no rebound and no guarding.  Genitourinary: Rectum normal and penis normal. No penile tenderness.  Genitourinary Comments: Slight enlarged prostate  Lymphadenopathy:    He has no cervical adenopathy.  Neurological: No cranial nerve deficit.  Lethargic.  Responds verbally when spoken to loudly, otherwise sleeps. Oriented to self.   Skin: Skin is warm and dry. He is not  diaphoretic.  Psychiatric: He has a normal mood and affect.  Vitals reviewed.   Labs reviewed: Recent Labs    08/30/16 10/11/16 02/01/17 0600  NA 141 138 142  K 4.5 3.7 4.7  BUN 18 24* 21  CREATININE 0.7 0.8 0.6   Recent Labs    02/01/17 0600  AST 17  ALT 14  ALKPHOS 73   Recent Labs    10/11/16 02/01/17 0600  WBC 18.1 7.2  HGB 13.0* 14.1  HCT 40* 43  PLT 165 151   Lab Results  Component Value Date   TSH 1.47 08/30/2016   Lab Results  Component Value Date   HGBA1C 5.7 (H) 01/13/2013   Lab Results  Component Value Date   CHOL 193 08/17/2016   HDL 46 08/17/2016   LDLCALC 133 08/17/2016   TRIG 86 08/17/2016   CHOLHDL 3.9 05/13/2014    Significant Diagnostic Results in last 30 days:  No results found.  Assessment/Plan  1. Non-intractable vomiting with nausea, unspecified vomiting type Clear liquids and advance as tolerated KUB and CXR ordered ?if there is an ileus or hiatal hernia  He is not diabetic so he would not be likely to have gastroparesis. His wife does not want him treated for infections unless they are affecting his quality of life. In other words, a pneumonia that is not painful would not be treated but a skin infection that is bothersome would be treated.   2. Gastroesophageal reflux disease without esophagitis Prilosec 20 mg BID for 4 weeks and then qd  3. Slow transit constipation I would avoid frequent use of MOM as this may contributor. I would also avoid the immodium which may lead to constipation and then a cycle of needing laxatives.  Add Colace 100 mg qd Continue miralax 17 grams qod   Family/ staff Communication: discussed with staff  Labs/tests ordered:  NA

## 2017-08-01 ENCOUNTER — Encounter: Payer: Self-pay | Admitting: Internal Medicine

## 2017-08-01 ENCOUNTER — Non-Acute Institutional Stay (SKILLED_NURSING_FACILITY): Payer: Medicare Other | Admitting: Internal Medicine

## 2017-08-01 DIAGNOSIS — R112 Nausea with vomiting, unspecified: Secondary | ICD-10-CM

## 2017-08-01 DIAGNOSIS — G35 Multiple sclerosis: Secondary | ICD-10-CM | POA: Diagnosis not present

## 2017-08-01 DIAGNOSIS — K5904 Chronic idiopathic constipation: Secondary | ICD-10-CM

## 2017-08-01 NOTE — Progress Notes (Signed)
Patient ID: [name], male   DOB: 10-24-1940, 76 y.o.   MRN: [id number]  Location:  [facility] Room Number: 129 Place of Service:  SNF (31) Provider:   [name], DO  Patient Care Team: [name], DO as PCP - General (Geriatric Medicine) [name], MD as Consulting Physician (Neurology) [name], MD as Consulting Physician (Urology) [name], MD as Consulting Physician (General Surgery) [name], MD as Consulting Physician (Orthopedic Surgery) [name], MD as Consulting Physician ([facility]) [name], MD as Consulting Physician (Gastroenterology) [name], DO as Consulting Physician (Geriatric Medicine)  Extended Emergency Contact Information Primary Emergency Contact: [name],[name] Address: [address]          [place], [place] [address] [name] of [place] Phone: [phone] Mobile Phone: [phone] Relation: Spouse  Code Status:  DNR Goals of care: Advanced Directive information Advanced Directives 08/01/2017  Does Patient Have a Medical Advance Directive? Yes  Type of Advance Directive Out of facility DNR (pink MOST or yellow form);[place];Living will  Does patient want to make changes to medical advance directive? No - Patient declined  Copy of [place] in Chart? Yes  Would patient like information on creating a medical advance directive? -  Pre-existing out of facility DNR order (yellow form or pink MOST form) Pink MOST form placed in chart (order not valid for inpatient use);Yellow form placed in chart (order not valid for inpatient use)   Chief Complaint  Patient presents with  . Acute Visit    periodic nausea/vomiting    HPI:  Pt is a 76 y.o. male seen today for an acute visit for periodic nausea and vomiting.  He was seen yesterday by NP due to an episode of projectile vomiting.  He then had one extra  large BM and one large BM.  KUB was negative.  No vomiting occurred this am.  Yesterday he received zofran instead of phenergan as he didn't tolerate this in the past.  His depakote was restarted due to increased behaviors in the evenings.  NP also started prilosec bid for 4 wks, then q am thereafter.  Also imodium was d/'cd due to concerns about the constipation causing partial obstruction of ileus.  Colace was scheduled.    When seen, he was resting peacefully this am.  Denied any pain.  Staff had no concerns this am.   Past Medical History:  Diagnosis Date  . Adenomatous polyp   . Allergy   . BPH (benign prostatic hyperplasia)   . COPD (chronic obstructive pulmonary disease) ([place])   . Dementia 2010  . Elevated PSA 07/31/2014  . Foot drop, right 2008  . 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 Generalized weakness 01/12/2013  . GERD (gastroesophageal reflux disease)   . Hyperlipidemia 07/31/2014  . Multiple sclerosis ([place]) 2008  . Prostatitis, chronic    Past Surgical History:  Procedure Laterality Date  . CATARACT EXTRACTION Left 02/06/12  . CATARACT EXTRACTION Right 2013  . ESOPHAGOGASTRODUODENOSCOPY ENDOSCOPY  04/16/2002   Dr. Earle Gell  . HERNIA REPAIR  1980'2000   3 inguinal hernia repairs on left  . HERNIA REPAIR  06/16/11   right inguinal hernia repair with mesh   . PROSTATE SURGERY  09/29/2008   Dr. Karsten Ro     Allergies  Allergen Reactions  . Aricept [Donepezil Hcl] Other (See Comments)    Reaction:  Unknown   . Dimethyl Fumarate Nausea  And Vomiting    Outpatient Encounter Medications as of 08/01/2017  Medication Sig  . aspirin 81 MG chewable tablet Chew 81 mg by mouth daily.  . Biotin 5 MG TABS Take 5 mg by mouth daily.   . cetirizine (ZYRTEC) 10 MG tablet Take 10 mg by mouth at bedtime.  . cholecalciferol (VITAMIN D) 1000 units tablet Take 2,000 Units by mouth daily.  . ciclopirox (LOPROX) 0.77 % cream Apply 1 application topically 2 (two) times daily. Pt applies to  face.  . divalproex (DEPAKOTE SPRINKLE) 125 MG capsule Take 125 mg by mouth at bedtime.   . docusate sodium (COLACE) 100 MG capsule Take 100 mg by mouth at bedtime as needed for mild constipation or moderate constipation.   Marland Kitchen ketoconazole (NIZORAL) 2 % shampoo Apply 1 application topically 2 (two) times a week. Pt uses on Tuesday and Friday.  . magnesium hydroxide (MILK OF MAGNESIA) 400 MG/5ML suspension Take 15 mLs by mouth daily as needed for mild constipation or moderate constipation.   . Melatonin 10 MG TABS Take 1 tablet by mouth at bedtime.  . memantine (NAMENDA) 10 MG tablet Take 10 mg by mouth 2 (two) times daily.  . naproxen sodium (ANAPROX) 220 MG tablet Take 220 mg by mouth every 8 (eight) hours as needed (for discomfort).   . nystatin (NYSTATIN) powder Apply Nystatin powder to reddened areas BID (SCROTUM, & RIGHT THIGH), if not healed after 7 days notify MD for further orders.  Marland Kitchen omeprazole (PRILOSEC) 20 MG capsule Take 20 mg 2 (two) times daily before a meal by mouth.  Derrill Memo ON 08/29/2017] omeprazole (PRILOSEC) 20 MG capsule Take 20 mg daily by mouth.  . ondansetron (ZOFRAN) 4 MG tablet Take 4 mg every 8 (eight) hours as needed by mouth for nausea or vomiting.  . sertraline (ZOLOFT) 100 MG tablet Take 100 mg by mouth daily.  . solifenacin (VESICARE) 10 MG tablet Take 10 mg by mouth daily.  . [DISCONTINUED] loperamide (IMODIUM) 2 MG capsule Take 4 mg by mouth as needed for diarrhea or loose stools (at onset of loose stool and 1 tab (2mg ) after each sucessive loose stool).   . [DISCONTINUED] LORazepam (ATIVAN) 0.5 MG tablet Take 0.5 mg by mouth every 8 (eight) hours as needed for anxiety.   . [DISCONTINUED] polyethylene glycol (MIRALAX / GLYCOLAX) packet Take 17 g by mouth every other day.   No facility-administered encounter medications on file as of 08/01/2017.     Review of Systems  Constitutional: Positive for activity change. Negative for appetite change and fever.  HENT:  Negative for congestion.   Eyes: Negative for visual disturbance.  Respiratory: Negative for chest tightness and shortness of breath.   Cardiovascular: Negative for chest pain and leg swelling.  Gastrointestinal: Positive for constipation. Negative for abdominal pain, blood in stool, diarrhea, nausea and vomiting.  Genitourinary: Negative for dysuria.       Suprapubic catheter  Musculoskeletal: Negative for arthralgias, back pain, gait problem and joint swelling.  Skin: Negative for color change.  Allergic/Immunologic: Negative for environmental allergies.  Neurological: Positive for weakness. Negative for dizziness and tremors.       Foot drop  Hematological: Does not bruise/bleed easily.  Psychiatric/Behavioral: Positive for behavioral problems and confusion.    Immunization History  Administered Date(s) Administered  . Influenza Inj Mdck Quad Pf 07/07/2016  . Influenza-Unspecified 07/02/2015  . Pneumococcal Conjugate-13 02/10/2015  . Pneumococcal Polysaccharide-23 10/23/2006  . Tdap 04/19/2005, 10/25/2016   Pertinent  Health Maintenance  Due  Topic Date Due  . INFLUENZA VACCINE  04/19/2017  . PNA vac Low Risk Adult  Completed   Fall Risk  01/04/2017 07/04/2016 02/17/2016 08/23/2015 08/19/2015  Falls in the past year? No Yes No Yes Yes  Number falls in past yr: - 2 or more - 1 2 or more  Injury with Fall? - Yes - Yes Yes  Comment - - - - had sutures  Risk Factor Category  - High Fall Risk - High Fall Risk -  Risk for fall due to : - History of fall(s);Impaired balance/gait - History of fall(s);Impaired balance/gait;Impaired mobility;Medication side effect;Mental status change -  Follow up - Falls evaluation completed - Falls evaluation completed;Education provided;Falls prevention discussed -   Functional Status Survey:    Vitals:   08/01/17 1119  BP: 121/74  Pulse: 82  Resp: 18  Temp: 97.9 F (36.6 C)  TempSrc: Oral  SpO2: 96%  Weight: 177 lb (80.3 kg)   Body mass  index is 23.35 kg/m. Physical Exam  Constitutional: No distress.  Cardiovascular: Normal rate, regular rhythm, normal heart sounds and intact distal pulses.  Pulmonary/Chest: Effort normal and breath sounds normal. No respiratory distress.  Abdominal: Soft. Bowel sounds are normal. He exhibits no distension. There is no tenderness.  Genitourinary:  Genitourinary Comments: Suprapubic catheter with dark yellow urine   Musculoskeletal:  Foot drop  Neurological: He is alert.  Psychiatric: He has a normal mood and affect.    Labs reviewed: Recent Labs    08/30/16 10/11/16 02/01/17 0600  NA 141 138 142  K 4.5 3.7 4.7  BUN 18 24* 21  CREATININE 0.7 0.8 0.6   Recent Labs    02/01/17 0600  AST 17  ALT 14  ALKPHOS 73   Recent Labs    10/11/16 02/01/17 0600  WBC 18.1 7.2  HGB 13.0* 14.1  HCT 40* 43  PLT 165 151   Lab Results  Component Value Date   TSH 1.47 08/30/2016   Lab Results  Component Value Date   HGBA1C 5.7 (H) 01/13/2013   Lab Results  Component Value Date   CHOL 193 08/17/2016   HDL 46 08/17/2016   LDLCALC 133 08/17/2016   TRIG 86 08/17/2016   CHOLHDL 3.9 05/13/2014    Assessment/Plan 1. Chronic idiopathic constipation -already on scheduled colace now -off imodium -cont as ordered  2. Non-intractable vomiting with nausea, unspecified vomiting type -cont zofran as needed, suspect due to constipation  3. Multiple sclerosis ([place]) -advanced with dementia -cont skilled care  Family/ staff Communication: discussed with nurse manager and snf nurse  Labs/tests ordered:  No new  Laketha Leopard L. Karen Kinnard, D.O. [facility] [address] [place], [place] [address] Cell Phone (Mon-Fri 8am-5pm):  [phone] On Call:  [phone] & follow prompts after 5pm & weekends Office Phone:  [phone] Office Fax:  [phone]

## 2017-08-08 DIAGNOSIS — R293 Abnormal posture: Secondary | ICD-10-CM | POA: Diagnosis not present

## 2017-08-08 DIAGNOSIS — M542 Cervicalgia: Secondary | ICD-10-CM | POA: Diagnosis not present

## 2017-08-08 DIAGNOSIS — R1311 Dysphagia, oral phase: Secondary | ICD-10-CM | POA: Diagnosis not present

## 2017-08-08 DIAGNOSIS — R600 Localized edema: Secondary | ICD-10-CM | POA: Diagnosis not present

## 2017-08-08 DIAGNOSIS — M436 Torticollis: Secondary | ICD-10-CM | POA: Diagnosis not present

## 2017-08-08 DIAGNOSIS — G35 Multiple sclerosis: Secondary | ICD-10-CM | POA: Diagnosis not present

## 2017-08-08 DIAGNOSIS — R1314 Dysphagia, pharyngoesophageal phase: Secondary | ICD-10-CM | POA: Diagnosis not present

## 2017-08-08 DIAGNOSIS — M6248 Contracture of muscle, other site: Secondary | ICD-10-CM | POA: Diagnosis not present

## 2017-08-09 DIAGNOSIS — M542 Cervicalgia: Secondary | ICD-10-CM | POA: Diagnosis not present

## 2017-08-09 DIAGNOSIS — M6248 Contracture of muscle, other site: Secondary | ICD-10-CM | POA: Diagnosis not present

## 2017-08-09 DIAGNOSIS — R293 Abnormal posture: Secondary | ICD-10-CM | POA: Diagnosis not present

## 2017-08-09 DIAGNOSIS — R1311 Dysphagia, oral phase: Secondary | ICD-10-CM | POA: Diagnosis not present

## 2017-08-09 DIAGNOSIS — R1314 Dysphagia, pharyngoesophageal phase: Secondary | ICD-10-CM | POA: Diagnosis not present

## 2017-08-09 DIAGNOSIS — R600 Localized edema: Secondary | ICD-10-CM | POA: Diagnosis not present

## 2017-08-14 DIAGNOSIS — M542 Cervicalgia: Secondary | ICD-10-CM | POA: Diagnosis not present

## 2017-08-14 DIAGNOSIS — M6248 Contracture of muscle, other site: Secondary | ICD-10-CM | POA: Diagnosis not present

## 2017-08-14 DIAGNOSIS — R1311 Dysphagia, oral phase: Secondary | ICD-10-CM | POA: Diagnosis not present

## 2017-08-14 DIAGNOSIS — R293 Abnormal posture: Secondary | ICD-10-CM | POA: Diagnosis not present

## 2017-08-14 DIAGNOSIS — R600 Localized edema: Secondary | ICD-10-CM | POA: Diagnosis not present

## 2017-08-14 DIAGNOSIS — R1314 Dysphagia, pharyngoesophageal phase: Secondary | ICD-10-CM | POA: Diagnosis not present

## 2017-08-15 DIAGNOSIS — R1311 Dysphagia, oral phase: Secondary | ICD-10-CM | POA: Diagnosis not present

## 2017-08-15 DIAGNOSIS — R600 Localized edema: Secondary | ICD-10-CM | POA: Diagnosis not present

## 2017-08-15 DIAGNOSIS — R1314 Dysphagia, pharyngoesophageal phase: Secondary | ICD-10-CM | POA: Diagnosis not present

## 2017-08-15 DIAGNOSIS — R293 Abnormal posture: Secondary | ICD-10-CM | POA: Diagnosis not present

## 2017-08-15 DIAGNOSIS — M542 Cervicalgia: Secondary | ICD-10-CM | POA: Diagnosis not present

## 2017-08-15 DIAGNOSIS — M6248 Contracture of muscle, other site: Secondary | ICD-10-CM | POA: Diagnosis not present

## 2017-08-16 DIAGNOSIS — R1314 Dysphagia, pharyngoesophageal phase: Secondary | ICD-10-CM | POA: Diagnosis not present

## 2017-08-16 DIAGNOSIS — M6248 Contracture of muscle, other site: Secondary | ICD-10-CM | POA: Diagnosis not present

## 2017-08-16 DIAGNOSIS — R293 Abnormal posture: Secondary | ICD-10-CM | POA: Diagnosis not present

## 2017-08-16 DIAGNOSIS — R1311 Dysphagia, oral phase: Secondary | ICD-10-CM | POA: Diagnosis not present

## 2017-08-16 DIAGNOSIS — R600 Localized edema: Secondary | ICD-10-CM | POA: Diagnosis not present

## 2017-08-16 DIAGNOSIS — M542 Cervicalgia: Secondary | ICD-10-CM | POA: Diagnosis not present

## 2017-08-17 DIAGNOSIS — R600 Localized edema: Secondary | ICD-10-CM | POA: Diagnosis not present

## 2017-08-17 DIAGNOSIS — R293 Abnormal posture: Secondary | ICD-10-CM | POA: Diagnosis not present

## 2017-08-17 DIAGNOSIS — R1314 Dysphagia, pharyngoesophageal phase: Secondary | ICD-10-CM | POA: Diagnosis not present

## 2017-08-17 DIAGNOSIS — M542 Cervicalgia: Secondary | ICD-10-CM | POA: Diagnosis not present

## 2017-08-17 DIAGNOSIS — R1311 Dysphagia, oral phase: Secondary | ICD-10-CM | POA: Diagnosis not present

## 2017-08-17 DIAGNOSIS — M6248 Contracture of muscle, other site: Secondary | ICD-10-CM | POA: Diagnosis not present

## 2017-08-18 DIAGNOSIS — R293 Abnormal posture: Secondary | ICD-10-CM | POA: Diagnosis not present

## 2017-08-18 DIAGNOSIS — R1311 Dysphagia, oral phase: Secondary | ICD-10-CM | POA: Diagnosis not present

## 2017-08-18 DIAGNOSIS — R600 Localized edema: Secondary | ICD-10-CM | POA: Diagnosis not present

## 2017-08-18 DIAGNOSIS — M6248 Contracture of muscle, other site: Secondary | ICD-10-CM | POA: Diagnosis not present

## 2017-08-18 DIAGNOSIS — R1314 Dysphagia, pharyngoesophageal phase: Secondary | ICD-10-CM | POA: Diagnosis not present

## 2017-08-18 DIAGNOSIS — M542 Cervicalgia: Secondary | ICD-10-CM | POA: Diagnosis not present

## 2017-08-20 DIAGNOSIS — R1314 Dysphagia, pharyngoesophageal phase: Secondary | ICD-10-CM | POA: Diagnosis not present

## 2017-08-20 DIAGNOSIS — R293 Abnormal posture: Secondary | ICD-10-CM | POA: Diagnosis not present

## 2017-08-20 DIAGNOSIS — R600 Localized edema: Secondary | ICD-10-CM | POA: Diagnosis not present

## 2017-08-20 DIAGNOSIS — R1311 Dysphagia, oral phase: Secondary | ICD-10-CM | POA: Diagnosis not present

## 2017-08-20 DIAGNOSIS — M436 Torticollis: Secondary | ICD-10-CM | POA: Diagnosis not present

## 2017-08-20 DIAGNOSIS — M542 Cervicalgia: Secondary | ICD-10-CM | POA: Diagnosis not present

## 2017-08-20 DIAGNOSIS — M6248 Contracture of muscle, other site: Secondary | ICD-10-CM | POA: Diagnosis not present

## 2017-08-20 DIAGNOSIS — G35 Multiple sclerosis: Secondary | ICD-10-CM | POA: Diagnosis not present

## 2017-08-21 ENCOUNTER — Non-Acute Institutional Stay (SKILLED_NURSING_FACILITY): Payer: Medicare Other | Admitting: Adult Health

## 2017-08-21 ENCOUNTER — Encounter: Payer: Self-pay | Admitting: Adult Health

## 2017-08-21 DIAGNOSIS — F0281 Dementia in other diseases classified elsewhere with behavioral disturbance: Secondary | ICD-10-CM

## 2017-08-21 DIAGNOSIS — N319 Neuromuscular dysfunction of bladder, unspecified: Secondary | ICD-10-CM | POA: Diagnosis not present

## 2017-08-21 DIAGNOSIS — R1311 Dysphagia, oral phase: Secondary | ICD-10-CM | POA: Diagnosis not present

## 2017-08-21 DIAGNOSIS — M436 Torticollis: Secondary | ICD-10-CM | POA: Diagnosis not present

## 2017-08-21 DIAGNOSIS — K219 Gastro-esophageal reflux disease without esophagitis: Secondary | ICD-10-CM

## 2017-08-21 DIAGNOSIS — G35 Multiple sclerosis: Secondary | ICD-10-CM

## 2017-08-21 DIAGNOSIS — M542 Cervicalgia: Secondary | ICD-10-CM | POA: Diagnosis not present

## 2017-08-21 DIAGNOSIS — K5904 Chronic idiopathic constipation: Secondary | ICD-10-CM

## 2017-08-21 DIAGNOSIS — R635 Abnormal weight gain: Secondary | ICD-10-CM

## 2017-08-21 DIAGNOSIS — R293 Abnormal posture: Secondary | ICD-10-CM | POA: Diagnosis not present

## 2017-08-21 DIAGNOSIS — M6248 Contracture of muscle, other site: Secondary | ICD-10-CM | POA: Diagnosis not present

## 2017-08-21 DIAGNOSIS — R600 Localized edema: Secondary | ICD-10-CM | POA: Diagnosis not present

## 2017-08-21 DIAGNOSIS — R1314 Dysphagia, pharyngoesophageal phase: Secondary | ICD-10-CM | POA: Diagnosis not present

## 2017-08-21 DIAGNOSIS — F02818 Dementia in other diseases classified elsewhere, unspecified severity, with other behavioral disturbance: Secondary | ICD-10-CM

## 2017-08-21 NOTE — Progress Notes (Signed)
Location:  [facility] of Service:  SNF (31) Provider:   Cindi Carbon, ANP [facility] [phone]   [name], DO  Patient Care Team: [name], DO as PCP - General (Geriatric Medicine) [name], MD as Consulting Physician (Neurology) [name], MD as Consulting Physician (Urology) [name], MD as Consulting Physician (General Surgery) [name], MD as Consulting Physician (Orthopedic Surgery) [name], MD as Consulting Physician ([facility]) [name], MD as Consulting Physician (Gastroenterology) [name], DO as Consulting Physician (Geriatric Medicine)  Extended Emergency Contact Information Primary Emergency Contact: [name] Address: [address]          [place], [place] [address] [name] of [place] Phone: [phone] Mobile Phone: [phone] Relation: Spouse  Code Status:  DNR Goals of care: Advanced Directive information Advanced Directives 08/01/2017  Does Patient Have a Medical Advance Directive? Yes  Type of Advance Directive Out of facility DNR (pink MOST or yellow form);[place];Living will  Does patient want to make changes to medical advance directive? No - Patient declined  Copy of [place] in Chart? Yes  Would patient like information on creating a medical advance directive? -  Pre-existing out of facility DNR order (yellow form or pink MOST form) Pink MOST form placed in chart (order not valid for inpatient use);Yellow form placed in chart (order not valid for inpatient use)     Chief Complaint  Patient presents with  . Acute Visit    weight gain  . Medical Management of Chronic Issues    HPI:  Pt is a 76 y.o. male seen today for an acute visit for weight gain, as well as medical management of chronic diseases. He has MS with progressive weakness, more so on the right. His health has  been declining and he is now a [employer].  He is working with speech therapy due to swallowing concerns, as well as OT due to a neck contracture due to weakness. They are helping with a brace and positioning. He has not had any episodes of aspiration pneumonia. The positioning of his neck may contribute to his swallowing concerns.  The staff report that he has gained 13 lbs in the past month, after having his weight verified multiple times. He appears larger around the head, neck, and waste. He takes boost twice a day and eats a regular diet. There are no reports of decreased 02 sats or difficulty breathing.  He does not have increased edema in his legs. The staff reports some edema in his right arm due to disuse which is chronic.  Also, he was having some episodes of vomiting last month which seems to have resolved. He was started on prilosec and colace at that time.    Past Medical History:  Diagnosis Date  . Adenomatous polyp   . Allergy   . BPH (benign prostatic hyperplasia)   . COPD (chronic obstructive pulmonary disease) ([facility])   . Dementia 2010  . Elevated PSA 07/31/2014  . Foot drop, right 2008  . 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 Generalized weakness 01/12/2013  . GERD (gastroesophageal reflux disease)   . Hyperlipidemia 07/31/2014  . Multiple sclerosis ([place]) 2008  . Prostatitis, chronic    Past Surgical History:  Procedure Laterality Date  . CATARACT EXTRACTION Left 02/06/12  . CATARACT EXTRACTION Right 2013  . ESOPHAGOGASTRODUODENOSCOPY ENDOSCOPY  04/16/2002   Dr. Earle Gell  . HERNIA  REPAIR  [id number]   3 inguinal hernia repairs on left  . HERNIA REPAIR  06/16/11   right inguinal hernia repair with mesh   . PROSTATE SURGERY  09/29/2008   Dr. Karsten Ro     Allergies  Allergen Reactions  . Aricept [Donepezil Hcl] Other (See Comments)    Reaction:  Unknown   . Dimethyl Fumarate Nausea And Vomiting    Outpatient Encounter Medications as of 08/21/2017  Medication Sig  .  aspirin 81 MG chewable tablet Chew 81 mg by mouth daily.  . Biotin 5 MG TABS Take 5 mg by mouth daily.   . cetirizine (ZYRTEC) 10 MG tablet Take 10 mg by mouth at bedtime.  . cholecalciferol (VITAMIN D) 1000 units tablet Take 2,000 Units by mouth daily.  . ciclopirox (LOPROX) 0.77 % cream Apply 1 application topically 2 (two) times daily as needed. Pt applies to face.  . divalproex (DEPAKOTE SPRINKLE) 125 MG capsule Take 125 mg by mouth at bedtime.   . docusate sodium (COLACE) 100 MG capsule Take 100 mg by mouth daily.   Marland Kitchen ketoconazole (NIZORAL) 2 % shampoo Apply 1 application topically 2 (two) times a week. Pt uses on Tuesday and Friday.  . magnesium hydroxide (MILK OF MAGNESIA) 400 MG/5ML suspension Take 15 mLs by mouth daily as needed for mild constipation or moderate constipation.   . Melatonin 10 MG TABS Take 1 tablet by mouth at bedtime.  . memantine (NAMENDA) 10 MG tablet Take 10 mg by mouth 2 (two) times daily.  . naproxen sodium (ANAPROX) 220 MG tablet Take 220 mg by mouth every 8 (eight) hours as needed (for discomfort).   Marland Kitchen omeprazole (PRILOSEC) 20 MG capsule Take 20 mg 2 (two) times daily before a meal by mouth.  Derrill Memo ON 08/29/2017] omeprazole (PRILOSEC) 20 MG capsule Take 20 mg daily by mouth.  . ondansetron (ZOFRAN) 4 MG tablet Take 4 mg every 8 (eight) hours as needed by mouth for nausea or vomiting.  . sertraline (ZOLOFT) 100 MG tablet Take 100 mg by mouth daily.  . solifenacin (VESICARE) 10 MG tablet Take 10 mg by mouth daily.   No facility-administered encounter medications on file as of 08/21/2017.     Review of Systems  Constitutional: Positive for unexpected weight change. Negative for activity change, appetite change, chills, diaphoresis, fatigue and fever.  HENT: Positive for trouble swallowing. Negative for congestion, postnasal drip, sinus pressure, sore throat and voice change.   Respiratory: Negative for cough, shortness of breath, wheezing and stridor.     Cardiovascular: Negative for chest pain, palpitations and leg swelling.  Gastrointestinal: Negative for abdominal distention, abdominal pain, constipation, diarrhea, nausea and vomiting.  Genitourinary: Negative for difficulty urinating and dysuria.  Musculoskeletal: Positive for gait problem. Negative for arthralgias, back pain, joint swelling and myalgias.  Skin: Negative for wound.  Neurological: Positive for weakness. Negative for dizziness, seizures, syncope, facial asymmetry, speech difficulty and headaches.  Hematological: Negative for adenopathy. Does not bruise/bleed easily.  Psychiatric/Behavioral: Positive for agitation and confusion. Negative for behavioral problems.    Immunization History  Administered Date(s) Administered  . Influenza Inj Mdck Quad Pf 07/07/2016  . Influenza-Unspecified 07/02/2015  . Pneumococcal Conjugate-13 02/10/2015  . Pneumococcal Polysaccharide-23 10/23/2006  . Tdap 04/19/2005, 10/25/2016   Pertinent  Health Maintenance Due  Topic Date Due  . INFLUENZA VACCINE  04/19/2017  . PNA vac Low Risk Adult  Completed   Fall Risk  01/04/2017 07/04/2016 02/17/2016 08/23/2015 08/19/2015  Falls in the past year?  No Yes No Yes Yes  Number falls in past yr: - 2 or more - 1 2 or more  Injury with Fall? - Yes - Yes Yes  Comment - - - - had sutures  Risk Factor Category  - High Fall Risk - High Fall Risk -  Risk for fall due to : - History of fall(s);Impaired balance/gait - History of fall(s);Impaired balance/gait;Impaired mobility;Medication side effect;Mental status change -  Follow up - Falls evaluation completed - Falls evaluation completed;Education provided;Falls prevention discussed -   Functional Status Survey:    Vitals:   08/21/17 1441  BP: 109/69  Pulse: 80  Resp: 20  Temp: (!) 97.4 F (36.3 C)  SpO2: 92%  Weight: 190 lb (86.2 kg)   Body mass index is 25.07 kg/m. Physical Exam  Constitutional: No distress.  HENT:  Head: Normocephalic and  atraumatic.  Nose: Nose normal.  Mouth/Throat: Oropharynx is clear and moist. No oropharyngeal exudate.  Eyes: Conjunctivae are normal. Pupils are equal, round, and reactive to light. Right eye exhibits no discharge. Left eye exhibits no discharge.  Neck: Normal range of motion. Neck supple. No JVD present. No tracheal deviation present. No thyromegaly present.  Cardiovascular: Normal rate and regular rhythm.  No murmur heard. Minimal pedal edema  Pulmonary/Chest: Effort normal and breath sounds normal. No respiratory distress. He has no wheezes.  Abdominal: Soft. Bowel sounds are normal. He exhibits no distension. There is no tenderness.  Musculoskeletal:  RUE with mild edema, not TTP, no erythema. Right sided weakness unchanged. Decreased ROM to the neck , head leaning to the left  Lymphadenopathy:    He has no cervical adenopathy.  Neurological: He is alert.  Oriented to self and place, not time or situation.   Skin: Skin is warm and dry. He is not diaphoretic.  Psychiatric: He has a normal mood and affect.    Labs reviewed: Recent Labs    08/30/16 10/11/16 02/01/17 0600  NA 141 138 142  K 4.5 3.7 4.7  BUN 18 24* 21  CREATININE 0.7 0.8 0.6   Recent Labs    02/01/17 0600  AST 17  ALT 14  ALKPHOS 73   Recent Labs    10/11/16 02/01/17 0600  WBC 18.1 7.2  HGB 13.0* 14.1  HCT 40* 43  PLT 165 151   Lab Results  Component Value Date   TSH 1.47 08/30/2016   Lab Results  Component Value Date   HGBA1C 5.7 (H) 01/13/2013   Lab Results  Component Value Date   CHOL 193 08/17/2016   HDL 46 08/17/2016   LDLCALC 133 08/17/2016   TRIG 86 08/17/2016   CHOLHDL 3.9 05/13/2014    Significant Diagnostic Results in last 30 days:  No results found.  Assessment/Plan  1. Weight gain Change boost from BID to qd prn Continue to monitor weight Check TSH and renal function No signs of CHF on exam, CXR in Nov did not show CHF   2. Multiple sclerosis ([place]) Progressive  decline in function Denies pain. Increasing right sided weakness.  No further treatment indicated, comfort care  3. Dementia associated with other underlying disease with behavioral disturbance Progressive decline in cognition and function associated with MS Continue Namenda  4. Neurogenic bladder No symptoms Continue vesicare and suprapubic catheter.   5. Contracture of neck Working with OT for supportive neck brace  6. Gastroesophageal reflux disease without esophagitis No further vomiting episodes Continue Prilosec 20 mg BID for 1 more week then 1 tab  qd  7. Chronic idiopathic constipation Controlled Continue Colace 100 mg qd   Family/ staff Communication: discussed with his nurse  Labs/tests ordered:  CBC BMP Depakote level and TSH in am

## 2017-08-22 DIAGNOSIS — R600 Localized edema: Secondary | ICD-10-CM | POA: Diagnosis not present

## 2017-08-22 DIAGNOSIS — M6248 Contracture of muscle, other site: Secondary | ICD-10-CM | POA: Diagnosis not present

## 2017-08-22 DIAGNOSIS — R293 Abnormal posture: Secondary | ICD-10-CM | POA: Diagnosis not present

## 2017-08-22 DIAGNOSIS — R1314 Dysphagia, pharyngoesophageal phase: Secondary | ICD-10-CM | POA: Diagnosis not present

## 2017-08-22 DIAGNOSIS — R1311 Dysphagia, oral phase: Secondary | ICD-10-CM | POA: Diagnosis not present

## 2017-08-22 DIAGNOSIS — M542 Cervicalgia: Secondary | ICD-10-CM | POA: Diagnosis not present

## 2017-08-22 DIAGNOSIS — R569 Unspecified convulsions: Secondary | ICD-10-CM | POA: Diagnosis not present

## 2017-08-22 DIAGNOSIS — D649 Anemia, unspecified: Secondary | ICD-10-CM | POA: Diagnosis not present

## 2017-08-22 DIAGNOSIS — E039 Hypothyroidism, unspecified: Secondary | ICD-10-CM | POA: Diagnosis not present

## 2017-08-22 DIAGNOSIS — R635 Abnormal weight gain: Secondary | ICD-10-CM | POA: Diagnosis not present

## 2017-08-22 LAB — TSH: TSH: 2.56 (ref 0.41–5.90)

## 2017-08-22 LAB — CBC AND DIFFERENTIAL
HCT: 44 (ref 41–53)
Hemoglobin: 14.7 (ref 13.5–17.5)
PLATELETS: 176 (ref 150–399)
WBC: 9.1

## 2017-08-22 LAB — BASIC METABOLIC PANEL
BUN: 15 (ref 4–21)
CREATININE: 0.6 (ref 0.6–1.3)
Glucose: 87
Potassium: 4.5 (ref 3.4–5.3)
Sodium: 141 (ref 137–147)

## 2017-08-23 DIAGNOSIS — M6248 Contracture of muscle, other site: Secondary | ICD-10-CM | POA: Diagnosis not present

## 2017-08-23 DIAGNOSIS — R1314 Dysphagia, pharyngoesophageal phase: Secondary | ICD-10-CM | POA: Diagnosis not present

## 2017-08-23 DIAGNOSIS — M542 Cervicalgia: Secondary | ICD-10-CM | POA: Diagnosis not present

## 2017-08-23 DIAGNOSIS — R293 Abnormal posture: Secondary | ICD-10-CM | POA: Diagnosis not present

## 2017-08-23 DIAGNOSIS — R1311 Dysphagia, oral phase: Secondary | ICD-10-CM | POA: Diagnosis not present

## 2017-08-23 DIAGNOSIS — R600 Localized edema: Secondary | ICD-10-CM | POA: Diagnosis not present

## 2017-08-25 DIAGNOSIS — R293 Abnormal posture: Secondary | ICD-10-CM | POA: Diagnosis not present

## 2017-08-25 DIAGNOSIS — M542 Cervicalgia: Secondary | ICD-10-CM | POA: Diagnosis not present

## 2017-08-25 DIAGNOSIS — R1314 Dysphagia, pharyngoesophageal phase: Secondary | ICD-10-CM | POA: Diagnosis not present

## 2017-08-25 DIAGNOSIS — R600 Localized edema: Secondary | ICD-10-CM | POA: Diagnosis not present

## 2017-08-25 DIAGNOSIS — R1311 Dysphagia, oral phase: Secondary | ICD-10-CM | POA: Diagnosis not present

## 2017-08-25 DIAGNOSIS — M6248 Contracture of muscle, other site: Secondary | ICD-10-CM | POA: Diagnosis not present

## 2017-08-29 DIAGNOSIS — M6248 Contracture of muscle, other site: Secondary | ICD-10-CM | POA: Diagnosis not present

## 2017-08-29 DIAGNOSIS — R1314 Dysphagia, pharyngoesophageal phase: Secondary | ICD-10-CM | POA: Diagnosis not present

## 2017-08-29 DIAGNOSIS — R1311 Dysphagia, oral phase: Secondary | ICD-10-CM | POA: Diagnosis not present

## 2017-08-29 DIAGNOSIS — R293 Abnormal posture: Secondary | ICD-10-CM | POA: Diagnosis not present

## 2017-08-29 DIAGNOSIS — M542 Cervicalgia: Secondary | ICD-10-CM | POA: Diagnosis not present

## 2017-08-29 DIAGNOSIS — R600 Localized edema: Secondary | ICD-10-CM | POA: Diagnosis not present

## 2017-08-30 DIAGNOSIS — R1311 Dysphagia, oral phase: Secondary | ICD-10-CM | POA: Diagnosis not present

## 2017-08-30 DIAGNOSIS — R1314 Dysphagia, pharyngoesophageal phase: Secondary | ICD-10-CM | POA: Diagnosis not present

## 2017-08-30 DIAGNOSIS — M542 Cervicalgia: Secondary | ICD-10-CM | POA: Diagnosis not present

## 2017-08-30 DIAGNOSIS — R600 Localized edema: Secondary | ICD-10-CM | POA: Diagnosis not present

## 2017-08-30 DIAGNOSIS — M6248 Contracture of muscle, other site: Secondary | ICD-10-CM | POA: Diagnosis not present

## 2017-08-30 DIAGNOSIS — R293 Abnormal posture: Secondary | ICD-10-CM | POA: Diagnosis not present

## 2017-08-31 DIAGNOSIS — R1311 Dysphagia, oral phase: Secondary | ICD-10-CM | POA: Diagnosis not present

## 2017-08-31 DIAGNOSIS — R600 Localized edema: Secondary | ICD-10-CM | POA: Diagnosis not present

## 2017-08-31 DIAGNOSIS — R293 Abnormal posture: Secondary | ICD-10-CM | POA: Diagnosis not present

## 2017-08-31 DIAGNOSIS — M6248 Contracture of muscle, other site: Secondary | ICD-10-CM | POA: Diagnosis not present

## 2017-08-31 DIAGNOSIS — M542 Cervicalgia: Secondary | ICD-10-CM | POA: Diagnosis not present

## 2017-08-31 DIAGNOSIS — R1314 Dysphagia, pharyngoesophageal phase: Secondary | ICD-10-CM | POA: Diagnosis not present

## 2017-09-04 DIAGNOSIS — M542 Cervicalgia: Secondary | ICD-10-CM | POA: Diagnosis not present

## 2017-09-04 DIAGNOSIS — R1314 Dysphagia, pharyngoesophageal phase: Secondary | ICD-10-CM | POA: Diagnosis not present

## 2017-09-04 DIAGNOSIS — R1311 Dysphagia, oral phase: Secondary | ICD-10-CM | POA: Diagnosis not present

## 2017-09-04 DIAGNOSIS — R293 Abnormal posture: Secondary | ICD-10-CM | POA: Diagnosis not present

## 2017-09-04 DIAGNOSIS — R600 Localized edema: Secondary | ICD-10-CM | POA: Diagnosis not present

## 2017-09-04 DIAGNOSIS — M6248 Contracture of muscle, other site: Secondary | ICD-10-CM | POA: Diagnosis not present

## 2017-09-05 DIAGNOSIS — R1314 Dysphagia, pharyngoesophageal phase: Secondary | ICD-10-CM | POA: Diagnosis not present

## 2017-09-05 DIAGNOSIS — M542 Cervicalgia: Secondary | ICD-10-CM | POA: Diagnosis not present

## 2017-09-05 DIAGNOSIS — M6248 Contracture of muscle, other site: Secondary | ICD-10-CM | POA: Diagnosis not present

## 2017-09-05 DIAGNOSIS — R1311 Dysphagia, oral phase: Secondary | ICD-10-CM | POA: Diagnosis not present

## 2017-09-05 DIAGNOSIS — R293 Abnormal posture: Secondary | ICD-10-CM | POA: Diagnosis not present

## 2017-09-05 DIAGNOSIS — R600 Localized edema: Secondary | ICD-10-CM | POA: Diagnosis not present

## 2017-09-07 DIAGNOSIS — R293 Abnormal posture: Secondary | ICD-10-CM | POA: Diagnosis not present

## 2017-09-07 DIAGNOSIS — R600 Localized edema: Secondary | ICD-10-CM | POA: Diagnosis not present

## 2017-09-07 DIAGNOSIS — R1314 Dysphagia, pharyngoesophageal phase: Secondary | ICD-10-CM | POA: Diagnosis not present

## 2017-09-07 DIAGNOSIS — R1311 Dysphagia, oral phase: Secondary | ICD-10-CM | POA: Diagnosis not present

## 2017-09-07 DIAGNOSIS — M542 Cervicalgia: Secondary | ICD-10-CM | POA: Diagnosis not present

## 2017-09-07 DIAGNOSIS — M6248 Contracture of muscle, other site: Secondary | ICD-10-CM | POA: Diagnosis not present

## 2017-09-14 DIAGNOSIS — M542 Cervicalgia: Secondary | ICD-10-CM | POA: Diagnosis not present

## 2017-09-14 DIAGNOSIS — R1311 Dysphagia, oral phase: Secondary | ICD-10-CM | POA: Diagnosis not present

## 2017-09-14 DIAGNOSIS — M6248 Contracture of muscle, other site: Secondary | ICD-10-CM | POA: Diagnosis not present

## 2017-09-14 DIAGNOSIS — R1314 Dysphagia, pharyngoesophageal phase: Secondary | ICD-10-CM | POA: Diagnosis not present

## 2017-09-14 DIAGNOSIS — R600 Localized edema: Secondary | ICD-10-CM | POA: Diagnosis not present

## 2017-09-14 DIAGNOSIS — R293 Abnormal posture: Secondary | ICD-10-CM | POA: Diagnosis not present

## 2017-09-19 DIAGNOSIS — R600 Localized edema: Secondary | ICD-10-CM | POA: Diagnosis not present

## 2017-09-19 DIAGNOSIS — M436 Torticollis: Secondary | ICD-10-CM | POA: Diagnosis not present

## 2017-09-19 DIAGNOSIS — M6248 Contracture of muscle, other site: Secondary | ICD-10-CM | POA: Diagnosis not present

## 2017-09-19 DIAGNOSIS — M542 Cervicalgia: Secondary | ICD-10-CM | POA: Diagnosis not present

## 2017-09-19 DIAGNOSIS — G35 Multiple sclerosis: Secondary | ICD-10-CM | POA: Diagnosis not present

## 2017-09-19 DIAGNOSIS — R293 Abnormal posture: Secondary | ICD-10-CM | POA: Diagnosis not present

## 2017-09-20 DIAGNOSIS — R293 Abnormal posture: Secondary | ICD-10-CM | POA: Diagnosis not present

## 2017-09-20 DIAGNOSIS — M6248 Contracture of muscle, other site: Secondary | ICD-10-CM | POA: Diagnosis not present

## 2017-09-20 DIAGNOSIS — M542 Cervicalgia: Secondary | ICD-10-CM | POA: Diagnosis not present

## 2017-09-20 DIAGNOSIS — G35 Multiple sclerosis: Secondary | ICD-10-CM | POA: Diagnosis not present

## 2017-09-20 DIAGNOSIS — M436 Torticollis: Secondary | ICD-10-CM | POA: Diagnosis not present

## 2017-09-20 DIAGNOSIS — R600 Localized edema: Secondary | ICD-10-CM | POA: Diagnosis not present

## 2017-09-21 DIAGNOSIS — R293 Abnormal posture: Secondary | ICD-10-CM | POA: Diagnosis not present

## 2017-09-21 DIAGNOSIS — R600 Localized edema: Secondary | ICD-10-CM | POA: Diagnosis not present

## 2017-09-21 DIAGNOSIS — M6248 Contracture of muscle, other site: Secondary | ICD-10-CM | POA: Diagnosis not present

## 2017-09-21 DIAGNOSIS — M436 Torticollis: Secondary | ICD-10-CM | POA: Diagnosis not present

## 2017-09-21 DIAGNOSIS — G35 Multiple sclerosis: Secondary | ICD-10-CM | POA: Diagnosis not present

## 2017-09-21 DIAGNOSIS — M542 Cervicalgia: Secondary | ICD-10-CM | POA: Diagnosis not present

## 2017-09-25 ENCOUNTER — Encounter: Payer: Self-pay | Admitting: Adult Health

## 2017-09-25 ENCOUNTER — Non-Acute Institutional Stay (SKILLED_NURSING_FACILITY): Payer: Medicare Other | Admitting: Adult Health

## 2017-09-25 DIAGNOSIS — R300 Dysuria: Secondary | ICD-10-CM | POA: Diagnosis not present

## 2017-09-25 DIAGNOSIS — N319 Neuromuscular dysfunction of bladder, unspecified: Secondary | ICD-10-CM

## 2017-09-25 NOTE — Progress Notes (Signed)
Location:  [facility]upational psychologist of Service:  SNF (31) Provider:  Cindi Carbon, ANP [place] [phone]   [name], DO  Patient Care Team: [name], DO as PCP - General (Geriatric Medicine) [name], MD as Consulting Physician (Neurology) [name], MD as Consulting Physician (Urology) [name], MD as Consulting Physician (General Surgery) [name], MD as Consulting Physician (Orthopedic Surgery) [name], MD as Consulting Physician ([facility]) [name], MD as Consulting Physician (Gastroenterology) [name], DO as Consulting Physician (Geriatric Medicine)  Extended Emergency Contact Information Primary Emergency Contact: [name] Address: [address]          [place], [place] [address] [name] of [place] Phone: [phone] Mobile Phone: [phone] Relation: Spouse  Code Status:  DNR Goals of care: Advanced Directive information Advanced Directives 08/01/2017  Does Patient Have a Medical Advance Directive? Yes  Type of Advance Directive Out of facility DNR (pink MOST or yellow form);[place];Living will  Does patient want to make changes to medical advance directive? No - Patient declined  Copy of [place] in Chart? Yes  Would patient like information on creating a medical advance directive? -  Pre-existing out of facility DNR order (yellow form or pink MOST form) Pink MOST form placed in chart (order not valid for inpatient use);Yellow form placed in chart (order not valid for inpatient use)     Chief Complaint  Patient presents with  . Acute Visit    agitation and bladder tenderness    HPI:  Pt is a 77 y.o. male seen today for an acute visit for agitation and bladder pain/tenderness. Mr. Teater has a hx of MS and has declined to the point that he now resides in skilled care. He has had periods of  agitation and is on depakote. He has had a long standing hx of bladder issues and has a suprapubic cath. He had been on vesicare for a long time and it was stopped on 12/22 as it was felt that he may no longer need it. On 1/2 the nurse started noticing increased agitation, aggression, and attempts to get out of bed without assistance. He was calling out that he needed to go to the bathroom and that his bladder hurt. The vesicare was resumed on 1/3 but the symptom continued through the weekend. He has not had a fever or purulent urine.    Past Medical History:  Diagnosis Date  . Adenomatous polyp   . Allergy   . BPH (benign prostatic hyperplasia)   . COPD (chronic obstructive pulmonary disease) ([place])   . Dementia 2010  . Elevated PSA 07/31/2014  . Foot drop, right 2008  . 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 Generalized weakness 01/12/2013  . GERD (gastroesophageal reflux disease)   . Hyperlipidemia 07/31/2014  . Multiple sclerosis ([place]) 2008  . Prostatitis, chronic    Past Surgical History:  Procedure Laterality Date  . CATARACT EXTRACTION Left 02/06/12  . CATARACT EXTRACTION Right 2013  . ESOPHAGOGASTRODUODENOSCOPY ENDOSCOPY  04/16/2002   Dr. Earle Gell  . HERNIA REPAIR  1980'2000   3 inguinal hernia repairs on left  . HERNIA REPAIR  06/16/11   right inguinal hernia repair with mesh   . PROSTATE SURGERY  09/29/2008   Dr. Karsten Ro     Allergies  Allergen Reactions  . Aricept [Donepezil Hcl] Other (See Comments)    Reaction:  Unknown   .  Dimethyl Fumarate Nausea And Vomiting    Outpatient Encounter Medications as of 09/25/2017  Medication Sig  . docusate (COLACE) 50 MG/5ML liquid Take 100 mg by mouth daily.  Marland Kitchen aspirin 81 MG chewable tablet Chew 81 mg by mouth daily.  . Biotin 5 MG TABS Take 5 mg by mouth daily.   . cetirizine (ZYRTEC) 10 MG tablet Take 10 mg by mouth at bedtime.  . cholecalciferol (VITAMIN D) 1000 units tablet Take 2,000 Units by mouth daily.  . ciclopirox (LOPROX)  0.77 % cream Apply 1 application topically 2 (two) times daily as needed. Pt applies to face.  . divalproex (DEPAKOTE SPRINKLE) 125 MG capsule Take 125 mg by mouth at bedtime.   Marland Kitchen ketoconazole (NIZORAL) 2 % shampoo Apply 1 application topically 2 (two) times a week. Pt uses on Tuesday and Friday.  . magnesium hydroxide (MILK OF MAGNESIA) 400 MG/5ML suspension Take 15 mLs by mouth daily as needed for mild constipation or moderate constipation.   . Melatonin 10 MG TABS Take 1 tablet by mouth at bedtime.  . memantine (NAMENDA) 10 MG tablet Take 10 mg by mouth 2 (two) times daily.  . naproxen sodium (ANAPROX) 220 MG tablet Take 220 mg by mouth every 8 (eight) hours as needed (for discomfort).   Marland Kitchen omeprazole (PRILOSEC) 20 MG capsule Take 20 mg 2 (two) times daily before a meal by mouth.  Marland Kitchen omeprazole (PRILOSEC) 20 MG capsule Take 20 mg daily by mouth.  . ondansetron (ZOFRAN) 4 MG tablet Take 4 mg every 8 (eight) hours as needed by mouth for nausea or vomiting.  . sertraline (ZOLOFT) 100 MG tablet Take 100 mg by mouth daily.  . solifenacin (VESICARE) 10 MG tablet Take 10 mg by mouth daily.  . [DISCONTINUED] docusate sodium (COLACE) 100 MG capsule Take 100 mg by mouth daily.    No facility-administered encounter medications on file as of 09/25/2017.     Review of Systems  Constitutional: Negative for activity change, appetite change, chills, diaphoresis, fatigue, fever and unexpected weight change.  Respiratory: Negative for cough, shortness of breath, wheezing and stridor.   Cardiovascular: Positive for leg swelling. Negative for chest pain and palpitations.  Gastrointestinal: Negative for abdominal distention, abdominal pain, constipation and diarrhea.  Genitourinary: Positive for dysuria and urgency. Negative for difficulty urinating, discharge, flank pain, frequency, hematuria, penile pain, penile swelling and scrotal swelling.       Bladder pain  Musculoskeletal: Positive for gait problem.  Negative for arthralgias, back pain, joint swelling and myalgias.  Neurological: Positive for weakness. Negative for dizziness, seizures, syncope, facial asymmetry, speech difficulty and headaches.  Hematological: Negative for adenopathy. Does not bruise/bleed easily.  Psychiatric/Behavioral: Positive for agitation, behavioral problems and confusion. Negative for self-injury, sleep disturbance and suicidal ideas. The patient is nervous/anxious.     Immunization History  Administered Date(s) Administered  . Influenza Inj Mdck Quad Pf 07/07/2016  . Influenza-Unspecified 07/02/2015, 07/13/2017  . Pneumococcal Conjugate-13 02/10/2015  . Pneumococcal Polysaccharide-23 10/23/2006  . Tdap 04/19/2005, 10/25/2016   Pertinent  Health Maintenance Due  Topic Date Due  . INFLUENZA VACCINE  Completed  . PNA vac Low Risk Adult  Completed   Fall Risk  01/04/2017 07/04/2016 02/17/2016 08/23/2015 08/19/2015  Falls in the past year? No Yes No Yes Yes  Number falls in past yr: - 2 or more - 1 2 or more  Injury with Fall? - Yes - Yes Yes  Comment - - - - had sutures  Risk Factor Category  -  High Fall Risk - High Fall Risk -  Risk for fall due to : - History of fall(s);Impaired balance/gait - History of fall(s);Impaired balance/gait;Impaired mobility;Medication side effect;Mental status change -  Follow up - Falls evaluation completed - Falls evaluation completed;Education provided;Falls prevention discussed -   Functional Status Survey:    Vitals:   09/25/17 1548  BP: 117/70  Pulse: 72  Resp: 17  Temp: (!) 97.5 F (36.4 C)  SpO2: 97%   There is no height or weight on file to calculate BMI. Physical Exam  Constitutional: No distress.  HENT:  Head: Normocephalic and atraumatic.  Nose: Nose normal.  Mouth/Throat: Oropharynx is clear and moist. No oropharyngeal exudate.  Eyes: Conjunctivae and EOM are normal. Pupils are equal, round, and reactive to light. Right eye exhibits no discharge. Left eye  exhibits no discharge.  Neck: Normal range of motion. Neck supple. No JVD present. No tracheal deviation present. No thyromegaly present.  Cardiovascular: Normal rate and regular rhythm.  No murmur heard. Pulmonary/Chest: Effort normal and breath sounds normal. No respiratory distress. He has no wheezes.  Abdominal: Soft. Bowel sounds are normal. He exhibits no distension. There is tenderness (suprapubic area, urine in bag is clear and yellow).  Lymphadenopathy:    He has no cervical adenopathy.  Neurological: He is alert. No cranial nerve deficit.  Oriented to self, place, not time. Able to f/c, pleasant, right sided arm and leg weakness  Skin: Skin is warm and dry. He is not diaphoretic.  Erythema and flaky skin to his forehead, nasolabial folds, and chin  Psychiatric: He has a normal mood and affect.  Vitals reviewed.   Labs reviewed: Recent Labs    10/11/16 02/01/17 0600 08/22/17  NA 138 142 141  K 3.7 4.7 4.5  BUN 24* 21 15  CREATININE 0.8 0.6 0.6   Recent Labs    02/01/17 0600  AST 17  ALT 14  ALKPHOS 73   Recent Labs    10/11/16 02/01/17 0600 08/22/17  WBC 18.1 7.2 9.1  HGB 13.0* 14.1 14.7  HCT 40* 43 44  PLT 165 151 176   Lab Results  Component Value Date   TSH 2.56 08/22/2017   Lab Results  Component Value Date   HGBA1C 5.7 (H) 01/13/2013   Lab Results  Component Value Date   CHOL 193 08/17/2016   HDL 46 08/17/2016   LDLCALC 133 08/17/2016   TRIG 86 08/17/2016   CHOLHDL 3.9 05/13/2014    Significant Diagnostic Results in last 30 days:  No results found.  Assessment/Plan  1) Dysuria He has had this before related to his MS and it is difficult to tease out when he has an active infection vs. Symptoms related to MS Given that he is also tender on exam and has increased agitation will order a UA C and S His wife is at the bedside and her main focus is comfort care but if there is something we can do to help him be more comfortable she would like  [place] to act. His pain and agitation have subsided during my visit. Will await C and S to treat.   2) Neurogenic bladder Continue Vesicare 10 mg qd Suprapubic cath care per staff   Family/ staff Communication: discussed with staff  Labs/tests ordered: UA C and S

## 2017-09-26 ENCOUNTER — Non-Acute Institutional Stay (SKILLED_NURSING_FACILITY): Payer: Medicare Other | Admitting: Internal Medicine

## 2017-09-26 ENCOUNTER — Encounter: Payer: Self-pay | Admitting: Internal Medicine

## 2017-09-26 DIAGNOSIS — R3982 Chronic bladder pain: Secondary | ICD-10-CM | POA: Diagnosis not present

## 2017-09-26 DIAGNOSIS — N319 Neuromuscular dysfunction of bladder, unspecified: Secondary | ICD-10-CM | POA: Diagnosis not present

## 2017-09-26 DIAGNOSIS — Z9359 Other cystostomy status: Secondary | ICD-10-CM

## 2017-09-26 DIAGNOSIS — F0281 Dementia in other diseases classified elsewhere with behavioral disturbance: Secondary | ICD-10-CM | POA: Diagnosis not present

## 2017-09-26 DIAGNOSIS — M6248 Contracture of muscle, other site: Secondary | ICD-10-CM | POA: Diagnosis not present

## 2017-09-26 DIAGNOSIS — N390 Urinary tract infection, site not specified: Secondary | ICD-10-CM | POA: Diagnosis not present

## 2017-09-26 DIAGNOSIS — K5904 Chronic idiopathic constipation: Secondary | ICD-10-CM

## 2017-09-26 DIAGNOSIS — F02818 Dementia in other diseases classified elsewhere, unspecified severity, with other behavioral disturbance: Secondary | ICD-10-CM

## 2017-09-26 DIAGNOSIS — R319 Hematuria, unspecified: Secondary | ICD-10-CM | POA: Diagnosis not present

## 2017-09-26 DIAGNOSIS — R410 Disorientation, unspecified: Secondary | ICD-10-CM | POA: Diagnosis not present

## 2017-09-26 DIAGNOSIS — G35 Multiple sclerosis: Secondary | ICD-10-CM

## 2017-09-26 DIAGNOSIS — R109 Unspecified abdominal pain: Secondary | ICD-10-CM

## 2017-09-26 DIAGNOSIS — Z79899 Other long term (current) drug therapy: Secondary | ICD-10-CM | POA: Diagnosis not present

## 2017-09-26 DIAGNOSIS — R600 Localized edema: Secondary | ICD-10-CM | POA: Diagnosis not present

## 2017-09-26 DIAGNOSIS — M436 Torticollis: Secondary | ICD-10-CM | POA: Diagnosis not present

## 2017-09-26 DIAGNOSIS — M542 Cervicalgia: Secondary | ICD-10-CM | POA: Diagnosis not present

## 2017-09-26 DIAGNOSIS — R293 Abnormal posture: Secondary | ICD-10-CM | POA: Diagnosis not present

## 2017-09-26 NOTE — Progress Notes (Signed)
Patient ID: [name], male   DOB: 1941-04-16, 77 y.o.   MRN: [id number]  Location:  [facility] Room Number: 129 Place of Service:  SNF (31) Provider:   [name], DO  Patient Care Team: [name], DO as PCP - General (Geriatric Medicine) [name], MD as Consulting Physician (Neurology) [name], MD as Consulting Physician (Urology) [name], MD as Consulting Physician (General Surgery) [name], MD as Consulting Physician (Orthopedic Surgery) [name], MD as Consulting Physician ([facility]) [name], MD as Consulting Physician (Gastroenterology) [name], DO as Consulting Physician (Geriatric Medicine)  Extended Emergency Contact Information Primary Emergency Contact: Tarbet,Peggy Address: [address]          [place], [place] [address] [name] of [place] Phone: [phone] Mobile Phone: [phone] Relation: Spouse  Code Status:  DNR Goals of care: Advanced Directive information Advanced Directives 09/26/2017  Does Patient Have a Medical Advance Directive? Yes  Type of Advance Directive Out of facility DNR (pink MOST or yellow form);[place];Living will  Does patient want to make changes to medical advance directive? No - Patient declined  Copy of [place] in Chart? Yes  Would patient like information on creating a medical advance directive? -  Pre-existing out of facility DNR order (yellow form or pink MOST form) Yellow form placed in chart (order not valid for inpatient use);Pink MOST form placed in chart (order not valid for inpatient use)     Chief Complaint  Patient presents with  . Medical Management of Chronic Issues    routine visit    HPI:  Pt is a 77 y.o. male seen today for medical management of chronic diseases.    He had left flank pain yesterday and UA c+s obtained.  Did reveal positive UA,  pending culture.  He's been back to yelling help all of the time which he had not been doing so much and was actually combative and agitated and startles when his left side is touched.  His wife had just been here visiting.  Aleve helps.  MS:  Is dependent in adls and needs lift for transfers.  Not on meds for this itself.  Dementia related to it has been progressive over the past few years.  On namenda with depakote for behaviors and mood stabilization.    Constipation:  Has bowel regimen as below.  Has periods of a few days where he'll have several BMs in a row.    Neurogenic bladder with suprapubic:  On vesicare per urology.    Past Medical History:  Diagnosis Date  . Adenomatous polyp   . Allergy   . BPH (benign prostatic hyperplasia)   . COPD (chronic obstructive pulmonary disease) ([place])   . Dementia 2010  . Elevated PSA 07/31/2014  . Foot drop, right 2008  . 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 Generalized weakness 01/12/2013  . GERD (gastroesophageal reflux disease)   . Hyperlipidemia 07/31/2014  . Multiple sclerosis ([place]) 2008  . Prostatitis, chronic    Past Surgical History:  Procedure Laterality Date  . CATARACT EXTRACTION Left 02/06/12  . CATARACT EXTRACTION Right 2013  . ESOPHAGOGASTRODUODENOSCOPY ENDOSCOPY  04/16/2002   Dr. Earle Gell  . HERNIA REPAIR  1980'2000   3 inguinal hernia repairs on left  . HERNIA REPAIR  06/16/11   right inguinal hernia repair with mesh   . PROSTATE SURGERY  09/29/2008   Dr. Karsten Ro  Allergies  Allergen Reactions  . Aricept [Donepezil Hcl] Other (See Comments)    Reaction:  Unknown   . Dimethyl Fumarate Nausea And Vomiting    Outpatient Encounter Medications as of 09/26/2017  Medication Sig  . aspirin 81 MG chewable tablet Chew 81 mg by mouth daily.  . Biotin 5 MG TABS Take 5 mg by mouth daily.   . bisacodyl (DULCOLAX) 10 MG suppository Place 10 mg rectally as needed for moderate constipation.  . cetirizine (ZYRTEC) 10 MG tablet Take  10 mg by mouth at bedtime.  . cholecalciferol (VITAMIN D) 1000 units tablet Take 2,000 Units by mouth daily.  . ciclopirox (LOPROX) 0.77 % cream Apply 1 application topically 2 (two) times daily as needed. Pt applies to face.  . divalproex (DEPAKOTE SPRINKLE) 125 MG capsule Take 125 mg by mouth at bedtime.   . docusate (COLACE) 50 MG/5ML liquid Take 100 mg by mouth daily.  Marland Kitchen ketoconazole (NIZORAL) 2 % shampoo Apply 1 application topically 2 (two) times a week. Pt uses on Tuesday and Friday.  . magnesium hydroxide (MILK OF MAGNESIA) 400 MG/5ML suspension Take 15 mLs by mouth daily as needed for mild constipation or moderate constipation.   . Melatonin 10 MG TABS Take 1 tablet by mouth at bedtime.  . memantine (NAMENDA) 10 MG tablet Take 10 mg by mouth 2 (two) times daily.  . naproxen sodium (ANAPROX) 220 MG tablet Take 220 mg by mouth every 8 (eight) hours as needed (for discomfort).   Marland Kitchen omeprazole (PRILOSEC) 20 MG capsule Take 20 mg daily by mouth.  . ondansetron (ZOFRAN) 4 MG tablet Take 4 mg every 8 (eight) hours as needed by mouth for nausea or vomiting.  . polyethylene glycol (MIRALAX / GLYCOLAX) packet Take 17 g by mouth daily.  . sertraline (ZOLOFT) 100 MG tablet Take 100 mg by mouth daily.  . solifenacin (VESICARE) 10 MG tablet Take 10 mg by mouth daily.  . [DISCONTINUED] omeprazole (PRILOSEC) 20 MG capsule Take 20 mg 2 (two) times daily before a meal by mouth.   No facility-administered encounter medications on file as of 09/26/2017.     Review of Systems  Constitutional: Negative for activity change, appetite change, chills, fatigue and fever.  HENT: Negative for congestion.   Eyes: Negative for visual disturbance.  Respiratory: Negative for chest tightness and shortness of breath.   Cardiovascular: Negative for leg swelling.  Gastrointestinal: Positive for abdominal pain and constipation. Negative for diarrhea and nausea.  Genitourinary:       Suprapubic cath in place with dark  yellow urine  Musculoskeletal: Positive for gait problem.  Skin: Negative for color change.  Neurological: Positive for weakness. Negative for dizziness.  Psychiatric/Behavioral: Positive for confusion.    Immunization History  Administered Date(s) Administered  . Influenza Inj Mdck Quad Pf 07/07/2016  . Influenza-Unspecified 07/02/2015, 07/13/2017  . Pneumococcal Conjugate-13 02/10/2015  . Pneumococcal Polysaccharide-23 10/23/2006  . Tdap 04/19/2005, 10/25/2016   Pertinent  Health Maintenance Due  Topic Date Due  . INFLUENZA VACCINE  Completed  . PNA vac Low Risk Adult  Completed   Fall Risk  01/04/2017 07/04/2016 02/17/2016 08/23/2015 08/19/2015  Falls in the past year? No Yes No Yes Yes  Number falls in past yr: - 2 or more - 1 2 or more  Injury with Fall? - Yes - Yes Yes  Comment - - - - had sutures  Risk Factor Category  - High Fall Risk - High Fall Risk -  Risk for fall  due to : - History of fall(s);Impaired balance/gait - History of fall(s);Impaired balance/gait;Impaired mobility;Medication side effect;Mental status change -  Follow up - Falls evaluation completed - Falls evaluation completed;Education provided;Falls prevention discussed -   Functional Status Survey:    Vitals:   09/26/17 1558  BP: 117/70  Pulse: 72  Resp: 17  Temp: (!) 97.5 F (36.4 C)  TempSrc: Oral  SpO2: 97%  Weight: 192 lb (87.1 kg)   Body mass index is 25.33 kg/m. Physical Exam  Constitutional: No distress.  Thin white male  Eyes:  glasses  Cardiovascular: Normal rate, regular rhythm, normal heart sounds and intact distal pulses.  Pulmonary/Chest: Effort normal and breath sounds normal. No respiratory distress.  Abdominal: Soft. Bowel sounds are normal. He exhibits no distension. There is no tenderness.  Genitourinary:  Genitourinary Comments: Suprapubic catheter in place  Musculoskeletal:  Has foot drop, uses wheelchair for mobility  Neurological: He is alert.    Labs  reviewed: Recent Labs    10/11/16 02/01/17 0600 08/22/17  NA 138 142 141  K 3.7 4.7 4.5  BUN 24* 21 15  CREATININE 0.8 0.6 0.6   Recent Labs    02/01/17 0600  AST 17  ALT 14  ALKPHOS 73   Recent Labs    10/11/16 02/01/17 0600 08/22/17  WBC 18.1 7.2 9.1  HGB 13.0* 14.1 14.7  HCT 40* 43 44  PLT 165 151 176   Lab Results  Component Value Date   TSH 2.56 08/22/2017   Lab Results  Component Value Date   HGBA1C 5.7 (H) 01/13/2013   Lab Results  Component Value Date   CHOL 193 08/17/2016   HDL 46 08/17/2016   LDLCALC 133 08/17/2016   TRIG 86 08/17/2016   CHOLHDL 3.9 05/13/2014    Assessment/Plan 1. Multiple sclerosis ([place]) -advanced stages, dependent in adls, long term snf resident  2. Suprapubic catheter ([place]) -due to #3, now may have UTI vs constipation causing left side pain  3. Neurogenic bladder -on vesicare per urology for bladder spasms, using aleve for pain  4. Dementia associated with other underlying disease with behavioral disturbance -advanced stage, cont regimen which had helped with his agitation and yelling help  5. Chronic idiopathic constipation -cont current bowel regimen -nurse reports that he has had a few bms today (? Actual cause of pain rather than urine--await culture result)  6. Left lateral abdominal pain -see #5  Family/ staff Communication: discussed with pt's wife and SNF nurse second shift  Labs/tests ordered:  Await urine culture and sensitivity  Tarance Balan L. Caidon Foti, D.O. [facility] [address], [place] [address] Cell Phone (Mon-Fri 8am-5pm):  [phone] On Call:  [phone] & follow prompts after 5pm & weekends Office Phone:  [phone] Office Fax:  [phone]

## 2017-09-28 DIAGNOSIS — R293 Abnormal posture: Secondary | ICD-10-CM | POA: Diagnosis not present

## 2017-09-28 DIAGNOSIS — M6248 Contracture of muscle, other site: Secondary | ICD-10-CM | POA: Diagnosis not present

## 2017-09-28 DIAGNOSIS — M542 Cervicalgia: Secondary | ICD-10-CM | POA: Diagnosis not present

## 2017-09-28 DIAGNOSIS — R600 Localized edema: Secondary | ICD-10-CM | POA: Diagnosis not present

## 2017-09-28 DIAGNOSIS — G35 Multiple sclerosis: Secondary | ICD-10-CM | POA: Diagnosis not present

## 2017-09-28 DIAGNOSIS — M436 Torticollis: Secondary | ICD-10-CM | POA: Diagnosis not present

## 2017-10-03 DIAGNOSIS — M6248 Contracture of muscle, other site: Secondary | ICD-10-CM | POA: Diagnosis not present

## 2017-10-03 DIAGNOSIS — R600 Localized edema: Secondary | ICD-10-CM | POA: Diagnosis not present

## 2017-10-03 DIAGNOSIS — M436 Torticollis: Secondary | ICD-10-CM | POA: Diagnosis not present

## 2017-10-03 DIAGNOSIS — M542 Cervicalgia: Secondary | ICD-10-CM | POA: Diagnosis not present

## 2017-10-03 DIAGNOSIS — R293 Abnormal posture: Secondary | ICD-10-CM | POA: Diagnosis not present

## 2017-10-03 DIAGNOSIS — G35 Multiple sclerosis: Secondary | ICD-10-CM | POA: Diagnosis not present

## 2017-10-04 DIAGNOSIS — G35 Multiple sclerosis: Secondary | ICD-10-CM | POA: Diagnosis not present

## 2017-10-04 DIAGNOSIS — M6248 Contracture of muscle, other site: Secondary | ICD-10-CM | POA: Diagnosis not present

## 2017-10-04 DIAGNOSIS — R600 Localized edema: Secondary | ICD-10-CM | POA: Diagnosis not present

## 2017-10-04 DIAGNOSIS — M436 Torticollis: Secondary | ICD-10-CM | POA: Diagnosis not present

## 2017-10-04 DIAGNOSIS — R293 Abnormal posture: Secondary | ICD-10-CM | POA: Diagnosis not present

## 2017-10-04 DIAGNOSIS — M542 Cervicalgia: Secondary | ICD-10-CM | POA: Diagnosis not present

## 2017-10-07 DIAGNOSIS — R293 Abnormal posture: Secondary | ICD-10-CM | POA: Diagnosis not present

## 2017-10-07 DIAGNOSIS — M436 Torticollis: Secondary | ICD-10-CM | POA: Diagnosis not present

## 2017-10-07 DIAGNOSIS — G35 Multiple sclerosis: Secondary | ICD-10-CM | POA: Diagnosis not present

## 2017-10-07 DIAGNOSIS — M542 Cervicalgia: Secondary | ICD-10-CM | POA: Diagnosis not present

## 2017-10-07 DIAGNOSIS — M6248 Contracture of muscle, other site: Secondary | ICD-10-CM | POA: Diagnosis not present

## 2017-10-07 DIAGNOSIS — R600 Localized edema: Secondary | ICD-10-CM | POA: Diagnosis not present

## 2017-10-09 ENCOUNTER — Encounter: Payer: Self-pay | Admitting: Adult Health

## 2017-10-09 ENCOUNTER — Non-Acute Institutional Stay (SKILLED_NURSING_FACILITY): Payer: Medicare Other | Admitting: Adult Health

## 2017-10-09 DIAGNOSIS — R451 Restlessness and agitation: Secondary | ICD-10-CM | POA: Diagnosis not present

## 2017-10-09 DIAGNOSIS — R600 Localized edema: Secondary | ICD-10-CM | POA: Diagnosis not present

## 2017-10-09 DIAGNOSIS — M542 Cervicalgia: Secondary | ICD-10-CM | POA: Diagnosis not present

## 2017-10-09 DIAGNOSIS — G35 Multiple sclerosis: Secondary | ICD-10-CM | POA: Diagnosis not present

## 2017-10-09 DIAGNOSIS — N319 Neuromuscular dysfunction of bladder, unspecified: Secondary | ICD-10-CM | POA: Diagnosis not present

## 2017-10-09 DIAGNOSIS — M436 Torticollis: Secondary | ICD-10-CM | POA: Diagnosis not present

## 2017-10-09 DIAGNOSIS — J309 Allergic rhinitis, unspecified: Secondary | ICD-10-CM

## 2017-10-09 DIAGNOSIS — R293 Abnormal posture: Secondary | ICD-10-CM | POA: Diagnosis not present

## 2017-10-09 DIAGNOSIS — M6248 Contracture of muscle, other site: Secondary | ICD-10-CM | POA: Diagnosis not present

## 2017-10-09 NOTE — Progress Notes (Signed)
Location:  [facility] of Service:  SNF (31) Provider:  Algis Greenhouse, RN, NP Student/ [place], [place], ANP  [name], DO  Patient Care Team: [name], DO as PCP - General (Geriatric Medicine) [name], MD as Consulting Physician (Neurology) [name], MD as Consulting Physician (Urology) [name], MD as Consulting Physician (General Surgery) [name], MD as Consulting Physician (Orthopedic Surgery) [name], MD as Consulting Physician ([facility]) [name], MD as Consulting Physician (Gastroenterology) [name], DO as Consulting Physician (Geriatric Medicine)  Extended Emergency Contact Information Primary Emergency Contact: Danford,Peggy Address: [address]          [place], [place] [address] [name] of [place] Phone: [phone] Mobile Phone: [phone] Relation: Spouse  Code Status:  DNR Goals of care: Advanced Directive information Advanced Directives 09/26/2017  Does Patient Have a Medical Advance Directive? Yes  Type of Advance Directive Out of facility DNR (pink MOST or yellow form);[place];Living will  Does patient want to make changes to medical advance directive? No - Patient declined  Copy of [place] in Chart? Yes  Would patient like information on creating a medical advance directive? -  Pre-existing out of facility DNR order (yellow form or pink MOST form) Yellow form placed in chart (order not valid for inpatient use);Pink MOST form placed in chart (order not valid for inpatient use)     Chief Complaint  Patient presents with  . Acute Visit    agitation    HPI:  Pt is a 77 y.o. male seen today for an acute visit for agitation. RN/NAs and wife report patient becoming increasingly verbally agitated over the last few days. Previously treated on 09/28/17 for a UTI d/t complaints of dysuria with  Cefpodoxine 200 mg BID x 7 days. Symptoms (dysuria, urgency, frequency) remain despite treatment. Evaluated the suprapubic site. Minimal redness present and no signs of discharge from the suprapubic. No signs of obstruction as urine flows freely and adequate UO daily. Minimal tenderness with manipulation of the catheter and palpitation around the site. ROM was assessed to evaluate pain as pt has decreased functional movement d/t MS, but no pain ellicited with passive ROM. Pt continues to have multiple regular BMs daily with medication support. Eliminating suspicion of constipation/impaction causes for agitation.   Past Medical History:  Diagnosis Date  . Adenomatous polyp   . Allergy   . BPH (benign prostatic hyperplasia)   . COPD (chronic obstructive pulmonary disease) ([place])   . Dementia 2010  . Elevated PSA 07/31/2014  . Foot drop, right 2008  . 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 Generalized weakness 01/12/2013  . GERD (gastroesophageal reflux disease)   . Hyperlipidemia 07/31/2014  . Multiple sclerosis ([place]) 2008  . Prostatitis, chronic    Past Surgical History:  Procedure Laterality Date  . CATARACT EXTRACTION Left 02/06/12  . CATARACT EXTRACTION Right 2013  . ESOPHAGOGASTRODUODENOSCOPY ENDOSCOPY  04/16/2002   Dr. Earle Gell  . HERNIA REPAIR  1980'2000   3 inguinal hernia repairs on left  . HERNIA REPAIR  06/16/11   right inguinal hernia repair with mesh   . PROSTATE SURGERY  09/29/2008   Dr. Karsten Ro     Allergies  Allergen Reactions  . Aricept [Donepezil Hcl] Other (See Comments)    Reaction:  Unknown   . Dimethyl Fumarate Nausea And Vomiting    Outpatient Encounter Medications as of 10/09/2017  Medication Sig  .  aspirin 81 MG chewable tablet Chew 81 mg by mouth daily.  . Biotin 5 MG TABS Take 5 mg by mouth daily.   . cetirizine (ZYRTEC) 10 MG tablet Take 10 mg by mouth at bedtime.  . cholecalciferol (VITAMIN D) 1000 units tablet Take 2,000 Units by mouth daily.  . ciclopirox  (LOPROX) 0.77 % cream Apply 1 application topically 2 (two) times daily as needed. Pt applies to face.  . divalproex (DEPAKOTE SPRINKLE) 125 MG capsule Take 125 mg by mouth at bedtime.   . docusate (COLACE) 50 MG/5ML liquid Take 100 mg by mouth daily.  Marland Kitchen ketoconazole (NIZORAL) 2 % shampoo Apply 1 application topically 2 (two) times a week. Pt uses on Tuesday and Friday.  . magnesium hydroxide (MILK OF MAGNESIA) 400 MG/5ML suspension Take 15 mLs by mouth daily as needed for mild constipation or moderate constipation.   . Melatonin 10 MG TABS Take 1 tablet by mouth at bedtime.  . memantine (NAMENDA) 10 MG tablet Take 10 mg by mouth 2 (two) times daily.  . naproxen sodium (ANAPROX) 220 MG tablet Take 220 mg by mouth every 8 (eight) hours as needed (for discomfort).   Marland Kitchen omeprazole (PRILOSEC) 20 MG capsule Take 20 mg daily by mouth.  . ondansetron (ZOFRAN) 4 MG tablet Take 4 mg every 8 (eight) hours as needed by mouth for nausea or vomiting.  . polyethylene glycol (MIRALAX / GLYCOLAX) packet Take 17 g by mouth daily.  . sertraline (ZOLOFT) 100 MG tablet Take 100 mg by mouth daily.  . solifenacin (VESICARE) 10 MG tablet Take 10 mg by mouth daily.   No facility-administered encounter medications on file as of 10/09/2017.     Review of Systems  Constitutional: Negative for activity change, appetite change, chills, fatigue, fever and unexpected weight change.  Respiratory: Negative.   Cardiovascular: Negative.   Gastrointestinal: Negative for abdominal distention, abdominal pain, constipation and rectal pain.  Genitourinary: Positive for dysuria, frequency and urgency. Negative for difficulty urinating and flank pain.  Musculoskeletal: Positive for neck stiffness. Negative for myalgias and neck pain.  Skin: Negative for color change.          Psychiatric/Behavioral: Positive for agitation, behavioral problems and confusion.    Immunization History  Administered Date(s) Administered  . Influenza  Inj Mdck Quad Pf 07/07/2016  . Influenza-Unspecified 07/02/2015, 07/13/2017  . Pneumococcal Conjugate-13 02/10/2015  . Pneumococcal Polysaccharide-23 10/23/2006  . Tdap 04/19/2005, 10/25/2016   Pertinent  Health Maintenance Due  Topic Date Due  . INFLUENZA VACCINE  Completed  . PNA vac Low Risk Adult  Completed   Fall Risk  01/04/2017 07/04/2016 02/17/2016 08/23/2015 08/19/2015  Falls in the past year? No Yes No Yes Yes  Number falls in past yr: - 2 or more - 1 2 or more  Injury with Fall? - Yes - Yes Yes  Comment - - - - had sutures  Risk Factor Category  - High Fall Risk - High Fall Risk -  Risk for fall due to : - History of fall(s);Impaired balance/gait - History of fall(s);Impaired balance/gait;Impaired mobility;Medication side effect;Mental status change -  Follow up - Falls evaluation completed - Falls evaluation completed;Education provided;Falls prevention discussed -   Functional Status Survey:    There were no vitals filed for this visit. There is no height or weight on file to calculate BMI. Physical Exam  Constitutional: He appears well-developed and well-nourished.  Neck: Neck rigidity present. Decreased range of motion present.  Contracture to left side of  neck.   Cardiovascular: Normal rate, regular rhythm, normal heart sounds and intact distal pulses.  Pulmonary/Chest: Effort normal and breath sounds normal.  Abdominal: Soft. Bowel sounds are normal. There is tenderness in the suprapubic area.    Musculoskeletal:  Dec passive ROM, but without pain through manipulation of joint.   Neurological: He is alert.  Psychiatric: His speech is normal. His affect is labile. He is agitated and aggressive.  Intermittent periods of agitation with aggressive language throughout the day.   Nursing note and vitals reviewed.   Labs reviewed: Recent Labs    10/11/16 02/01/17 0600 08/22/17  NA 138 142 141  K 3.7 4.7 4.5  BUN 24* 21 15  CREATININE 0.8 0.6 0.6   Recent Labs     02/01/17 0600  AST 17  ALT 14  ALKPHOS 73   Recent Labs    10/11/16 02/01/17 0600 08/22/17  WBC 18.1 7.2 9.1  HGB 13.0* 14.1 14.7  HCT 40* 43 44  PLT 165 151 176   Lab Results  Component Value Date   TSH 2.56 08/22/2017   Lab Results  Component Value Date   HGBA1C 5.7 (H) 01/13/2013   Lab Results  Component Value Date   CHOL 193 08/17/2016   HDL 46 08/17/2016   LDLCALC 133 08/17/2016   TRIG 86 08/17/2016   CHOLHDL 3.9 05/13/2014    Significant Diagnostic Results in last 30 days:  No results found.  Assessment/Plan  Agitation:  Due to the lack of bowel symptoms and pain, highly suspect-full that increased agitation is due to advancement of dementia. Pt previously responded well to Depakote with closely monitored dosing. His only adverse event reported being increased lethargy. Plan will be to increase Depakote 125 mg BID. Last Depakote level done on 08/22/17 and resulted 11 ug/mL. Acceptable since use is for behavior purposes. Plan to redraw Depakote level in 01/2018 or sooner if indicated.  Will plan to reevaluate PRN.   Neurogenic Bladder:  Pt consistently complains of urinary symptoms (dysuria, frequency, and urgency). Due to the lack of support for an acute infection considering no symptom exacerbation, fever, and recent treatment for UTI without relief, assured that urinary symptoms are due to his underlying neurogenic bladder. Suspicion for infection remains low at this time. Will plan to reevaluate if presentation changes.   Family/ staff Communication: Discussed with his nurse.   Labs/tests ordered:  n/a

## 2017-10-09 NOTE — Progress Notes (Signed)
This encounter was created in error - please disregard.  This encounter was created in error - please disregard.

## 2017-10-11 DIAGNOSIS — M542 Cervicalgia: Secondary | ICD-10-CM | POA: Diagnosis not present

## 2017-10-11 DIAGNOSIS — M436 Torticollis: Secondary | ICD-10-CM | POA: Diagnosis not present

## 2017-10-11 DIAGNOSIS — M6248 Contracture of muscle, other site: Secondary | ICD-10-CM | POA: Diagnosis not present

## 2017-10-11 DIAGNOSIS — R293 Abnormal posture: Secondary | ICD-10-CM | POA: Diagnosis not present

## 2017-10-11 DIAGNOSIS — G35 Multiple sclerosis: Secondary | ICD-10-CM | POA: Diagnosis not present

## 2017-10-11 DIAGNOSIS — R600 Localized edema: Secondary | ICD-10-CM | POA: Diagnosis not present

## 2017-10-13 DIAGNOSIS — R293 Abnormal posture: Secondary | ICD-10-CM | POA: Diagnosis not present

## 2017-10-13 DIAGNOSIS — M6248 Contracture of muscle, other site: Secondary | ICD-10-CM | POA: Diagnosis not present

## 2017-10-13 DIAGNOSIS — G35 Multiple sclerosis: Secondary | ICD-10-CM | POA: Diagnosis not present

## 2017-10-13 DIAGNOSIS — M436 Torticollis: Secondary | ICD-10-CM | POA: Diagnosis not present

## 2017-10-13 DIAGNOSIS — R600 Localized edema: Secondary | ICD-10-CM | POA: Diagnosis not present

## 2017-10-13 DIAGNOSIS — M542 Cervicalgia: Secondary | ICD-10-CM | POA: Diagnosis not present

## 2017-10-16 DIAGNOSIS — R293 Abnormal posture: Secondary | ICD-10-CM | POA: Diagnosis not present

## 2017-10-16 DIAGNOSIS — G35 Multiple sclerosis: Secondary | ICD-10-CM | POA: Diagnosis not present

## 2017-10-16 DIAGNOSIS — M6248 Contracture of muscle, other site: Secondary | ICD-10-CM | POA: Diagnosis not present

## 2017-10-16 DIAGNOSIS — R600 Localized edema: Secondary | ICD-10-CM | POA: Diagnosis not present

## 2017-10-16 DIAGNOSIS — M436 Torticollis: Secondary | ICD-10-CM | POA: Diagnosis not present

## 2017-10-16 DIAGNOSIS — M542 Cervicalgia: Secondary | ICD-10-CM | POA: Diagnosis not present

## 2017-10-20 ENCOUNTER — Encounter: Payer: Self-pay | Admitting: Adult Health

## 2017-10-20 ENCOUNTER — Non-Acute Institutional Stay (SKILLED_NURSING_FACILITY): Payer: Medicare Other | Admitting: Adult Health

## 2017-10-20 DIAGNOSIS — M542 Cervicalgia: Secondary | ICD-10-CM | POA: Diagnosis not present

## 2017-10-20 DIAGNOSIS — R600 Localized edema: Secondary | ICD-10-CM | POA: Diagnosis not present

## 2017-10-20 DIAGNOSIS — N319 Neuromuscular dysfunction of bladder, unspecified: Secondary | ICD-10-CM | POA: Diagnosis not present

## 2017-10-20 DIAGNOSIS — L219 Seborrheic dermatitis, unspecified: Secondary | ICD-10-CM

## 2017-10-20 DIAGNOSIS — M436 Torticollis: Secondary | ICD-10-CM | POA: Diagnosis not present

## 2017-10-20 DIAGNOSIS — M6248 Contracture of muscle, other site: Secondary | ICD-10-CM | POA: Diagnosis not present

## 2017-10-20 DIAGNOSIS — G35 Multiple sclerosis: Secondary | ICD-10-CM | POA: Diagnosis not present

## 2017-10-20 DIAGNOSIS — R293 Abnormal posture: Secondary | ICD-10-CM | POA: Diagnosis not present

## 2017-10-20 NOTE — Progress Notes (Signed)
Location:  Occupational psychologist of Service:  SNF (31) Provider:   Cindi Carbon, ANP [place] [phone]   [name], DO  Patient Care Team: [name], DO as PCP - General (Geriatric Medicine) [name], MD as Consulting Physician (Neurology) [name], MD as Consulting Physician (Urology) [name], MD as Consulting Physician (General Surgery) [name], MD as Consulting Physician (Orthopedic Surgery) [name], MD as Consulting Physician ([facility]) [name], MD as Consulting Physician (Gastroenterology) [name], DO as Consulting Physician (Geriatric Medicine)  Extended Emergency Contact Information Primary Emergency Contact: [name] Address: [address]          [place], [place] [address] [name] of [place] Phone: [phone] Mobile Phone: [phone] Relation: Spouse  Code Status:  DNR Goals of care: Advanced Directive information Advanced Directives 09/26/2017  Does Patient Have a Medical Advance Directive? Yes  Type of Advance Directive Out of facility DNR (pink MOST or yellow form);[place];Living will  Does patient want to make changes to medical advance directive? No - Patient declined  Copy of [place] in Chart? Yes  Would patient like information on creating a medical advance directive? -  Pre-existing out of facility DNR order (yellow form or pink MOST form) Yellow form placed in chart (order not valid for inpatient use);Pink MOST form placed in chart (order not valid for inpatient use)     Chief Complaint  Patient presents with  . Acute Visit    bladder pain    HPI:  Pt is a 77 y.o. male seen today for an acute visit for bladder pain. Has been on ongoing issue for the past few years due to his diagnosis of MS.  Depakote was increased to 125 mg BID 10/09/17 due to yelling out, thinking some of his  issues of bladder pain and feeling like he needed to go to the BR were behavioral related.   Treated for UTI on 1/10 with 7 days of cefpodoxime which did not help with symptoms.  UA showed 2+ bacteria, pos nitrite, 2+ leuk esterase, 3+ blood, 30-50 WBC final report grew >100,000 colonies of e.coli which was pan sensitive.   Remains on vesicare for bladder spasms.  He has not had a fever or change in mental status. He yells out frequently when he is left alone and feels the need to go to urinate frequently.  He says his bladder is painful.    Nurse also reports red, scaly dry skin to face. Ciclopirox helps.  Past Medical History:  Diagnosis Date  . Adenomatous polyp   . Allergy   . BPH (benign prostatic hyperplasia)   . COPD (chronic obstructive pulmonary disease) ([place])   . Dementia 2010  . Elevated PSA 07/31/2014  . Foot drop, right 2008  . 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 Generalized weakness 01/12/2013  . GERD (gastroesophageal reflux disease)   . Hyperlipidemia 07/31/2014  . Multiple sclerosis ([place]) 2008  . Prostatitis, chronic    Past Surgical History:  Procedure Laterality Date  . CATARACT EXTRACTION Left 02/06/12  . CATARACT EXTRACTION Right 2013  . ESOPHAGOGASTRODUODENOSCOPY ENDOSCOPY  04/16/2002   Dr. Earle Gell  . HERNIA REPAIR  1980'2000   3 inguinal hernia repairs on left  . HERNIA REPAIR  06/16/11   right inguinal hernia repair with mesh   . PROSTATE SURGERY  09/29/2008   Dr. Karsten Ro     Allergies  Allergen Reactions  . Aricept [Donepezil Hcl] Other (See Comments)    Reaction:  Unknown   . Dimethyl Fumarate Nausea And Vomiting    Outpatient Encounter Medications as of 10/20/2017  Medication Sig  . aspirin 81 MG chewable tablet Chew 81 mg by mouth daily.  . Biotin 5 MG TABS Take 5 mg by mouth daily.   . cetirizine (ZYRTEC) 10 MG tablet Take 10 mg by mouth at bedtime.  . cholecalciferol (VITAMIN D) 1000 units tablet Take 2,000 Units by mouth daily.  . ciclopirox  (LOPROX) 0.77 % cream Apply 1 application topically 2 (two) times daily as needed. Pt applies to face.  . divalproex (DEPAKOTE SPRINKLE) 125 MG capsule Take 125 mg by mouth at bedtime.   . docusate (COLACE) 50 MG/5ML liquid Take 100 mg by mouth daily.  Marland Kitchen ketoconazole (NIZORAL) 2 % shampoo Apply 1 application topically 2 (two) times a week. Pt uses on Tuesday and Friday.  . magnesium hydroxide (MILK OF MAGNESIA) 400 MG/5ML suspension Take 15 mLs by mouth daily as needed for mild constipation or moderate constipation.   . Melatonin 10 MG TABS Take 1 tablet by mouth at bedtime.  . memantine (NAMENDA) 10 MG tablet Take 10 mg by mouth 2 (two) times daily.  . naproxen sodium (ANAPROX) 220 MG tablet Take 220 mg by mouth every 8 (eight) hours as needed (for discomfort).   Marland Kitchen omeprazole (PRILOSEC) 20 MG capsule Take 20 mg daily by mouth.  . ondansetron (ZOFRAN) 4 MG tablet Take 4 mg every 8 (eight) hours as needed by mouth for nausea or vomiting.  . polyethylene glycol (MIRALAX / GLYCOLAX) packet Take 17 g by mouth daily.  . sertraline (ZOLOFT) 100 MG tablet Take 100 mg by mouth daily.  . solifenacin (VESICARE) 10 MG tablet Take 10 mg by mouth daily.   No facility-administered encounter medications on file as of 10/20/2017.     Review of Systems  Constitutional: Negative for activity change, appetite change, chills, diaphoresis, fatigue, fever and unexpected weight change.  HENT: Negative for congestion.   Respiratory: Negative for cough, shortness of breath, wheezing and stridor.   Cardiovascular: Positive for leg swelling. Negative for chest pain and palpitations.  Gastrointestinal: Positive for abdominal pain (bladder area). Negative for abdominal distention, constipation and diarrhea.  Genitourinary: Positive for frequency and urgency. Negative for difficulty urinating, discharge, dysuria, enuresis, flank pain, hematuria, penile pain, penile swelling, scrotal swelling and testicular pain.    Musculoskeletal: Positive for gait problem. Negative for arthralgias, back pain, joint swelling and myalgias.  Neurological: Positive for weakness. Negative for dizziness, seizures, syncope, facial asymmetry, speech difficulty and headaches.  Hematological: Negative for adenopathy. Does not bruise/bleed easily.  Psychiatric/Behavioral: Positive for agitation, behavioral problems and confusion.    Immunization History  Administered Date(s) Administered  . Influenza Inj Mdck Quad Pf 07/07/2016  . Influenza-Unspecified 07/02/2015, 07/13/2017  . Pneumococcal Conjugate-13 02/10/2015  . Pneumococcal Polysaccharide-23 10/23/2006  . Tdap 04/19/2005, 10/25/2016   Pertinent  Health Maintenance Due  Topic Date Due  . INFLUENZA VACCINE  Completed  . PNA vac Low Risk Adult  Completed   Fall Risk  01/04/2017 07/04/2016 02/17/2016 08/23/2015 08/19/2015  Falls in the past year? No Yes No Yes Yes  Number falls in past yr: - 2 or more - 1 2 or more  Injury with Fall? - Yes - Yes Yes  Comment - - - - had sutures  Risk Factor Category  - High Fall Risk - High Fall Risk -  Risk for fall due to : - History of fall(s);Impaired balance/gait - History of fall(s);Impaired balance/gait;Impaired mobility;Medication side effect;Mental status change -  Follow up - Falls evaluation completed - Falls evaluation completed;Education provided;Falls prevention discussed -   Functional Status Survey:    Vitals:   10/20/17 1104  BP: 123/79  Pulse: 66  Resp: 20  Temp: (!) 97.5 F (36.4 C)  SpO2: 96%  Weight: 195 lb (88.5 kg)   Body mass index is 25.73 kg/m. Physical Exam  Constitutional: He is oriented to person, place, and time. No distress.  HENT:  Head: Normocephalic and atraumatic.  Nose: Nose normal.  Mouth/Throat: No oropharyngeal exudate.  Erythema to nasal labial folds and around his ears  Neck: Normal range of motion. Neck supple. No JVD present. No tracheal deviation present. No thyromegaly  present.  Cardiovascular: Normal rate and regular rhythm.  No murmur heard. Pulmonary/Chest: Effort normal and breath sounds normal. No respiratory distress. He has no wheezes.  Abdominal: Soft. Bowel sounds are normal. He exhibits no distension. There is tenderness (bladder area).  Lymphadenopathy:    He has no cervical adenopathy.  Neurological: He is alert and oriented to person, place, and time. No cranial nerve deficit.  Pleasant, able to f/c.  Oriented to self, place, not time  Skin: Skin is warm and dry. He is not diaphoretic.  Psychiatric: He has a normal mood and affect.    Labs reviewed: Recent Labs    02/01/17 0600 08/22/17  NA 142 141  K 4.7 4.5  BUN 21 15  CREATININE 0.6 0.6   Recent Labs    02/01/17 0600  AST 17  ALT 14  ALKPHOS 73   Recent Labs    02/01/17 0600 08/22/17  WBC 7.2 9.1  HGB 14.1 14.7  HCT 43 44  PLT 151 176   Lab Results  Component Value Date   TSH 2.56 08/22/2017   Lab Results  Component Value Date   HGBA1C 5.7 (H) 01/13/2013   Lab Results  Component Value Date   CHOL 193 08/17/2016   HDL 46 08/17/2016   LDLCALC 133 08/17/2016   TRIG 86 08/17/2016   CHOLHDL 3.9 05/13/2014    Significant Diagnostic Results in last 30 days:  No results found.  Assessment/Plan  Neurogenic bladder Begin Neurontin 100 mg p.o. TID to see if this helps with bladder pain.   Seborrheic dermatitis  ciclopirox 0.77% cream to affected area BID no stop date  Family/ staff Communication: discussed with nsg supervisor  Labs/tests ordered:  NA

## 2017-10-24 DIAGNOSIS — M542 Cervicalgia: Secondary | ICD-10-CM | POA: Diagnosis not present

## 2017-10-24 DIAGNOSIS — M6248 Contracture of muscle, other site: Secondary | ICD-10-CM | POA: Diagnosis not present

## 2017-10-24 DIAGNOSIS — G35 Multiple sclerosis: Secondary | ICD-10-CM | POA: Diagnosis not present

## 2017-10-24 DIAGNOSIS — R600 Localized edema: Secondary | ICD-10-CM | POA: Diagnosis not present

## 2017-10-24 DIAGNOSIS — R293 Abnormal posture: Secondary | ICD-10-CM | POA: Diagnosis not present

## 2017-10-24 DIAGNOSIS — M436 Torticollis: Secondary | ICD-10-CM | POA: Diagnosis not present

## 2017-10-30 ENCOUNTER — Non-Acute Institutional Stay (SKILLED_NURSING_FACILITY): Payer: Medicare Other | Admitting: Adult Health

## 2017-10-30 ENCOUNTER — Encounter: Payer: Self-pay | Admitting: Adult Health

## 2017-10-30 DIAGNOSIS — J101 Influenza due to other identified influenza virus with other respiratory manifestations: Secondary | ICD-10-CM

## 2017-10-30 DIAGNOSIS — J181 Lobar pneumonia, unspecified organism: Secondary | ICD-10-CM | POA: Diagnosis not present

## 2017-10-30 DIAGNOSIS — G4483 Primary cough headache: Secondary | ICD-10-CM | POA: Diagnosis not present

## 2017-10-30 DIAGNOSIS — R05 Cough: Secondary | ICD-10-CM | POA: Diagnosis not present

## 2017-10-30 NOTE — Progress Notes (Addendum)
Location:  [facility] of Service:  SNF (31) Provider:   Cindi Carbon, ANP [place] [phone]   [name], DO  Patient Care Team: [name], DO as PCP - General (Geriatric Medicine) [name], MD as Consulting Physician (Neurology) [name], MD as Consulting Physician (Urology) [name], MD as Consulting Physician (General Surgery) [name], MD as Consulting Physician (Orthopedic Surgery) [name], MD as Consulting Physician ([facility]) [name], MD as Consulting Physician (Gastroenterology) [name], DO as Consulting Physician (Geriatric Medicine)  Extended Emergency Contact Information Primary Emergency Contact: [name] Address: [address]          [place], [place] [address] [name] of [place] Phone: [phone] Mobile Phone: [phone] Relation: Spouse  Code Status:  DNR Goals of care: Advanced Directive information Advanced Directives 09/26/2017  Does Patient Have a Medical Advance Directive? Yes  Type of Advance Directive Out of facility DNR (pink MOST or yellow form);[place];Living will  Does patient want to make changes to medical advance directive? No - Patient declined  Copy of [place] in Chart? Yes  Would patient like information on creating a medical advance directive? -  Pre-existing out of facility DNR order (yellow form or pink MOST form) Yellow form placed in chart (order not valid for inpatient use);Pink MOST form placed in chart (order not valid for inpatient use)     Chief Complaint  Patient presents with  . Acute Visit    fever 102.6    HPI:  Pt is a 77 y.o. male seen today for an acute visit for a temp of 102.6 and congestion. Mr. [name] appeared sleepy on 2/10 and had a cough and congestion. This am his temp is 102.6.  His sats are 90 RA.  He is sleepy and not able to  swallow pills. Last evening his neurontin and depakote were held due to excess sleepiness. The oncall provider was called early this am and duonebs and CXR were ordered. 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 He has been declining for quite some time. He has a DNR and a MOST for indicating no hospitalizations.    Past Medical History:  Diagnosis Date  . Adenomatous polyp   . Allergy   . BPH (benign prostatic hyperplasia)   . COPD (chronic obstructive pulmonary disease) ([place])   . Dementia 2010  . Elevated PSA 07/31/2014  . Foot drop, right 2008  . 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 Generalized weakness 01/12/2013  . GERD (gastroesophageal reflux disease)   . Hyperlipidemia 07/31/2014  . Multiple sclerosis ([place]) 2008  . Prostatitis, chronic    Past Surgical History:  Procedure Laterality Date  . CATARACT EXTRACTION Left 02/06/12  . CATARACT EXTRACTION Right 2013  . ESOPHAGOGASTRODUODENOSCOPY ENDOSCOPY  04/16/2002   Dr. Earle Gell  . HERNIA REPAIR  1980'2000   3 inguinal hernia repairs on left  . HERNIA REPAIR  06/16/11   right inguinal hernia repair with mesh   . PROSTATE SURGERY  09/29/2008   Dr. Karsten Ro     Allergies  Allergen Reactions  . Aricept [Donepezil Hcl] Other (See Comments)    Reaction:  Unknown   . Dimethyl Fumarate Nausea And Vomiting    Outpatient Encounter Medications as of 10/30/2017  Medication Sig  . aspirin 81 MG chewable tablet Chew 81 mg by mouth daily.  . Biotin 5 MG  TABS Take 5 mg by mouth daily.   . cetirizine (ZYRTEC) 10 MG tablet Take 10 mg by mouth at bedtime.  . cholecalciferol (VITAMIN D) 1000 units tablet Take 2,000 Units by mouth daily.  . ciclopirox (LOPROX) 0.77 % cream Apply 1 application topically 2 (two) times daily as needed. Pt applies to face.  . divalproex (DEPAKOTE SPRINKLE) 125 MG capsule Take 125 mg by mouth at bedtime.   . docusate (COLACE) 50 MG/5ML liquid Take 100 mg by mouth daily.  Marland Kitchen  ketoconazole (NIZORAL) 2 % shampoo Apply 1 application topically 2 (two) times a week. Pt uses on Tuesday and Friday.  . magnesium hydroxide (MILK OF MAGNESIA) 400 MG/5ML suspension Take 15 mLs by mouth daily as needed for mild constipation or moderate constipation.   . Melatonin 10 MG TABS Take 1 tablet by mouth at bedtime.  . memantine (NAMENDA) 10 MG tablet Take 10 mg by mouth 2 (two) times daily.  . naproxen sodium (ANAPROX) 220 MG tablet Take 220 mg by mouth every 8 (eight) hours as needed (for discomfort).   Marland Kitchen omeprazole (PRILOSEC) 20 MG capsule Take 20 mg daily by mouth.  . ondansetron (ZOFRAN) 4 MG tablet Take 4 mg every 8 (eight) hours as needed by mouth for nausea or vomiting.  . polyethylene glycol (MIRALAX / GLYCOLAX) packet Take 17 g by mouth daily.  . sertraline (ZOLOFT) 100 MG tablet Take 100 mg by mouth daily.  . solifenacin (VESICARE) 10 MG tablet Take 10 mg by mouth daily.   No facility-administered encounter medications on file as of 10/30/2017.     Review of Systems  Unable to perform ROS: Dementia    Immunization History  Administered Date(s) Administered  . Influenza Inj Mdck Quad Pf 07/07/2016  . Influenza-Unspecified 07/02/2015, 07/13/2017  . Pneumococcal Conjugate-13 02/10/2015  . Pneumococcal Polysaccharide-23 10/23/2006  . Tdap 04/19/2005, 10/25/2016   Pertinent  Health Maintenance Due  Topic Date Due  . INFLUENZA VACCINE  Completed  . PNA vac Low Risk Adult  Completed   Fall Risk  01/04/2017 07/04/2016 02/17/2016 08/23/2015 08/19/2015  Falls in the past year? No Yes No Yes Yes  Number falls in past yr: - 2 or more - 1 2 or more  Injury with Fall? - Yes - Yes Yes  Comment - - - - had sutures  Risk Factor Category  - High Fall Risk - High Fall Risk -  Risk for fall due to : - History of fall(s);Impaired balance/gait - History of fall(s);Impaired balance/gait;Impaired mobility;Medication side effect;Mental status change -  Follow up - Falls evaluation  completed - Falls evaluation completed;Education provided;Falls prevention discussed -   Functional Status Survey:    Vitals:   10/30/17 0856  BP: 103/61  Pulse: (!) 101  Resp: (!) 24  Temp: (!) 102.6 F (39.2 C)  SpO2: 90%   There is no height or weight on file to calculate BMI. Physical Exam  Constitutional: No distress.  HENT:  Head: Normocephalic and atraumatic.  Nose: Nose normal.  Mouth/Throat: Oropharyngeal exudate present.    Neck: No JVD present.  Neck with decreased ROM   Cardiovascular: Normal rate and regular rhythm.  No murmur heard. Pulmonary/Chest: Effort normal. No respiratory distress. He has no wheezes.  Bilateral rhonchi  Abdominal: Soft. Bowel sounds are normal.  Lymphadenopathy:    He has no cervical adenopathy.  Neurological:  obtunded  Skin: Skin is warm and dry. He is not diaphoretic.    Labs reviewed: Recent Labs  02/01/17 0600 08/22/17  NA 142 141  K 4.7 4.5  BUN 21 15  CREATININE 0.6 0.6   Recent Labs    02/01/17 0600  AST 17  ALT 14  ALKPHOS 73   Recent Labs    02/01/17 0600 08/22/17  WBC 7.2 9.1  HGB 14.1 14.7  HCT 43 44  PLT 151 176   Lab Results  Component Value Date   TSH 2.56 08/22/2017   Lab Results  Component Value Date   HGBA1C 5.7 (H) 01/13/2013   Lab Results  Component Value Date   CHOL 193 08/17/2016   HDL 46 08/17/2016   LDLCALC 133 08/17/2016   TRIG 86 08/17/2016   CHOLHDL 3.9 05/13/2014    Significant Diagnostic Results in last 30 days:  No results found.  Assessment/Plan  1. Influenza A Tamiflu 75  Mg BID for 5 days  Droplet precaution  2. Lobar pneumonia ([place]) CXR returned showing a left base pneumonia.  Levaquin 500 mg qd for 7 days He is more alert now and able to swallow but if this becomes an issue we can discontinue the levaquin and try Rocephin 1 gram IM for 5 days I advised her that we should not do IV antibiotics given his poor quality of life with agitation and propensity  to pull the IV out and she agreed. She stated that she could not "not treat him for an infection", as she felt too guilty. We discussed that he has had such a difficult course of illness and progressive decline. She verbalized understanding. He remains a DNR and no hospitalizations.  Duoneb q 6 hrs scheduled  X 72 hrs  Family/ staff Communication: discussed with his wife, Vickii Chafe  Labs/tests ordered:   Flu swab  CXR

## 2017-11-14 ENCOUNTER — Non-Acute Institutional Stay (SKILLED_NURSING_FACILITY): Payer: Medicare Other | Admitting: Internal Medicine

## 2017-11-14 ENCOUNTER — Encounter: Payer: Self-pay | Admitting: Internal Medicine

## 2017-11-14 DIAGNOSIS — R531 Weakness: Secondary | ICD-10-CM | POA: Diagnosis not present

## 2017-11-14 DIAGNOSIS — F0281 Dementia in other diseases classified elsewhere with behavioral disturbance: Secondary | ICD-10-CM | POA: Diagnosis not present

## 2017-11-14 DIAGNOSIS — M436 Torticollis: Secondary | ICD-10-CM

## 2017-11-14 DIAGNOSIS — G35 Multiple sclerosis: Secondary | ICD-10-CM | POA: Diagnosis not present

## 2017-11-14 DIAGNOSIS — N319 Neuromuscular dysfunction of bladder, unspecified: Secondary | ICD-10-CM | POA: Diagnosis not present

## 2017-11-14 DIAGNOSIS — M25511 Pain in right shoulder: Secondary | ICD-10-CM | POA: Diagnosis not present

## 2017-11-14 DIAGNOSIS — F02818 Dementia in other diseases classified elsewhere, unspecified severity, with other behavioral disturbance: Secondary | ICD-10-CM

## 2017-11-14 DIAGNOSIS — Z9359 Other cystostomy status: Secondary | ICD-10-CM | POA: Diagnosis not present

## 2017-11-14 NOTE — Progress Notes (Signed)
Patient ID: [name], male   DOB: May 26, 1941, 77 y.o.   MRN: [id number]  Location:  [facility] Room Number: 129 Place of Service:  SNF [phone]) Provider:  Karen Kays, RN, DNP Student  [name], DO  Patient Care Team: [name], DO as PCP - General (Geriatric Medicine) [name], MD as Consulting Physician (Neurology) [name], MD as Consulting Physician (Urology) [name], MD as Consulting Physician (General Surgery) [name], MD as Consulting Physician (Orthopedic Surgery) [name], MD as Consulting Physician ([facility]) [name], MD as Consulting Physician (Gastroenterology) [name], DO as Consulting Physician (Geriatric Medicine)  Extended Emergency Contact Information Primary Emergency Contact: [name],Peggy Address: [address]          [name], [name] [address] [name] of [place] Phone: [phone] Mobile Phone: [phone] Relation: Spouse  Code Status:  DNR Goals of care: Advanced Directive information Advanced Directives 11/14/2017  Does Patient Have a Medical Advance Directive? Yes  Type of Advance Directive Out of facility DNR (pink MOST or yellow form);[place];Living will  Does patient want to make changes to medical advance directive? No - Patient declined  Copy of [place] in Chart? Yes  Would patient like information on creating a medical advance directive? -  Pre-existing out of facility DNR order (yellow form or pink MOST form) Yellow form placed in chart (order not valid for inpatient use);Pink MOST form placed in chart (order not valid for inpatient use)     Chief Complaint  Patient presents with  . Acute Visit    swollen right arm    HPI:  Pt is a 77 y.o. male seen today for an acute visit for Right arm/hand/finger swelling and pain. Aide noticed on 2/25 that he was having pain when  she lifted his arm up. On further examination, she noticed that his upper arm felt tighter than Left upper arm, it was cool to touch, and that his hand and fingers were swollen, as well as fingers slightly discolored, but radial pulse was intact. There was no warmth or redness at the site, no previous injury, he does not sleep on the affected side, and he was not showing signs of infection systemically. RN elevated the arm to help reduce swelling and applied ice to hand and fingers. She also gave him his PRN dose of Aleve for the pain. RN reports that the pain and swelling improved with these interventions. He Right side at baseline (history of MS) is weaker and he is predominately dependent with its care of that side, as well as most ADLs.   Today he was found in his room sitting in his wheelchair that has reclining abilities. He has his aide present with him. He is in good spirits and does not report feeling any pain when asked. Upon examination, his hand is elevated on two towels. He has a weak, but present pulse, equal bilaterally. He cap refill is greater than 3 seconds. He denies pain with passive movement of his hand, wrist, and fingers. He does report pain when mild pressure is applied to his upper and middle trapezius that continues into the top of shoulder (not at the joint). He displays no discomfort with passive ROM of right shoulder, though his ROM is limited. This is his baseline. He has a weak to absent grip on the Right, Left is stronger, but still weak. He has no pains  of a headache, but his neck and upper back feel tight and when rubbed he says that feels good. He hangs his head as is looking down, he has a device that is worn to keep his head and neck midline, but this seems to not help much, as his head comes forward so much that he is still have a slight pull to the Left. He displays signs of Right sided neglect in this nature. When his chin is lifted to support his neck and head he reports  some discomfort. Placed a pillow under chin and he stated that was better, it did not apply as much direct alignment as when I used my hand. He does have a device for this, but his Aide reports his wife is not fond of all the "contraptions" he wears. PT has worked with him by way of massage and heat to help loosen these tight areas of muscle.   On further examination of his arm, it is noted he has a flatten 1 inch by 1/2 inch soft mobile growth/cyst under the skin. It is does not demonstrate signs of infection. It does not cause pain with manipulation or touch. His aide reports that is new, the brace he wears has a pad that goes under the arm, unsure if this is pressure related versus lipoma.   Per nurse: He is wheelchair bound, hoyer lift is used to help with mobility. He is doing better from last month when he had both PNA and Flu A. He has no coughing and is breathing well. His weight is stable, he has picked up a few pounds since the illness has cleared. He can feed himself, but with supervision as he will continue to place food in mouth before swallowing. He enjoys his meals. Swallowing can be hard at times due to physical changes in posture. He is sleeping well. He supra pubic cath that is intact and without infection. He is incontinent of bowel. Skin is without reports of breakdown. He is oriented to self and room, but has dementia.     Past Medical History:  Diagnosis Date  . Adenomatous polyp   . Allergy   . BPH (benign prostatic hyperplasia)   . COPD (chronic obstructive pulmonary disease) ([place])   . Dementia 2010  . Elevated PSA 07/31/2014  . Foot drop, right 2008  . 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 Generalized weakness 01/12/2013  . GERD (gastroesophageal reflux disease)   . Hyperlipidemia 07/31/2014  . Multiple sclerosis ([place]) 2008  . Prostatitis, chronic    Past Surgical History:  Procedure Laterality Date  . CATARACT EXTRACTION Left 02/06/12  . CATARACT EXTRACTION Right 2013  .  ESOPHAGOGASTRODUODENOSCOPY ENDOSCOPY  04/16/2002   Dr. Earle Gell  . HERNIA REPAIR  1980'2000   3 inguinal hernia repairs on left  . HERNIA REPAIR  06/16/11   right inguinal hernia repair with mesh   . PROSTATE SURGERY  09/29/2008   Dr. Karsten Ro     Allergies  Allergen Reactions  . Aricept [Donepezil Hcl] Other (See Comments)    Reaction:  Unknown   . Dimethyl Fumarate Nausea And Vomiting    Outpatient Encounter Medications as of 11/14/2017  Medication Sig  . aspirin 81 MG chewable tablet Chew 81 mg by mouth daily.  . Biotin 5 MG TABS Take 5 mg by mouth daily.   . cetirizine (ZYRTEC) 10 MG tablet Take 10 mg by mouth at bedtime.  . cholecalciferol (VITAMIN D) 1000 units tablet Take 2,000 Units by mouth  daily.  . ciclopirox (LOPROX) 0.77 % cream Apply 1 application topically 2 (two) times daily as needed. Pt applies to face.  . divalproex (DEPAKOTE SPRINKLE) 125 MG capsule Take 125 mg by mouth at bedtime.   . docusate (COLACE) 50 MG/5ML liquid Take 100 mg by mouth daily.  Marland Kitchen gabapentin (NEURONTIN) 100 MG capsule Take 100 mg by mouth 3 (three) times daily.  Marland Kitchen ketoconazole (NIZORAL) 2 % shampoo Apply 1 application topically 2 (two) times a week. Pt uses on Tuesday and Friday.  . magnesium hydroxide (MILK OF MAGNESIA) 400 MG/5ML suspension Take 15 mLs by mouth daily as needed for mild constipation or moderate constipation.   . Melatonin 10 MG TABS Take 1 tablet by mouth at bedtime.  . memantine (NAMENDA) 10 MG tablet Take 10 mg by mouth 2 (two) times daily.  . naproxen sodium (ANAPROX) 220 MG tablet Take 220 mg by mouth every 8 (eight) hours as needed (for discomfort).   Marland Kitchen omeprazole (PRILOSEC) 20 MG capsule Take 20 mg daily by mouth.  . ondansetron (ZOFRAN) 4 MG tablet Take 4 mg every 8 (eight) hours as needed by mouth for nausea or vomiting.  . polyethylene glycol (MIRALAX / GLYCOLAX) packet Take 17 g by mouth daily.  . sertraline (ZOLOFT) 100 MG tablet Take 100 mg by mouth daily.  .  solifenacin (VESICARE) 10 MG tablet Take 10 mg by mouth daily.   No facility-administered encounter medications on file as of 11/14/2017.     Review of Systems  Unable to perform ROS: Dementia (performed with the assistance of nurse)  Constitutional: Negative.   Respiratory: Positive for shortness of breath. Negative for cough.   Cardiovascular: Negative for chest pain and palpitations.  Genitourinary:       Supra pubic cath  Musculoskeletal: Positive for myalgias and neck pain.       Right shoulder   Wheelchair bound  Skin: Positive for pallor.    Immunization History  Administered Date(s) Administered  . Influenza Inj Mdck Quad Pf 07/07/2016  . Influenza-Unspecified 07/02/2015, 07/13/2017  . Pneumococcal Conjugate-13 02/10/2015  . Pneumococcal Polysaccharide-23 10/23/2006  . Tdap 04/19/2005, 10/25/2016   Pertinent  Health Maintenance Due  Topic Date Due  . INFLUENZA VACCINE  Completed  . PNA vac Low Risk Adult  Completed   Fall Risk  01/04/2017 07/04/2016 02/17/2016 08/23/2015 08/19/2015  Falls in the past year? No Yes No Yes Yes  Number falls in past yr: - 2 or more - 1 2 or more  Injury with Fall? - Yes - Yes Yes  Comment - - - - had sutures  Risk Factor Category  - High Fall Risk - High Fall Risk -  Risk for fall due to : - History of fall(s);Impaired balance/gait - History of fall(s);Impaired balance/gait;Impaired mobility;Medication side effect;Mental status change -  Follow up - Falls evaluation completed - Falls evaluation completed;Education provided;Falls prevention discussed -   Functional Status Survey:    Vitals:   11/14/17 1151  BP: 96/65  Pulse: 67  Resp: 18  Temp: (!) 96.1 F (35.6 C)  TempSrc: Oral  Weight: 197 lb (89.4 kg)   Body mass index is 25.99 kg/m. Physical Exam  Constitutional: He appears well-developed and well-nourished.  HENT:  Head: Normocephalic.  Neck: Muscular tenderness present. Neck rigidity present.    Cardiovascular:  Normal rate, regular rhythm, normal heart sounds and intact distal pulses.  Pulmonary/Chest: Effort normal and breath sounds normal.  Abdominal: Soft. Bowel sounds are normal.  Musculoskeletal:  Right shoulder: He exhibits decreased range of motion.       Arms: Neurological: He is alert.  Oriented to self only. Able to follow commands and answer simple questions  Skin: Skin is warm and dry. There is pallor.    Labs reviewed: Recent Labs    02/01/17 0600 08/22/17  NA 142 141  K 4.7 4.5  BUN 21 15  CREATININE 0.6 0.6   Recent Labs    02/01/17 0600  AST 17  ALT 14  ALKPHOS 73   Recent Labs    02/01/17 0600 08/22/17  WBC 7.2 9.1  HGB 14.1 14.7  HCT 43 44  PLT 151 176   Lab Results  Component Value Date   TSH 2.56 08/22/2017   Lab Results  Component Value Date   HGBA1C 5.7 (H) 01/13/2013   Lab Results  Component Value Date   CHOL 193 08/17/2016   HDL 46 08/17/2016   LDLCALC 133 08/17/2016   TRIG 86 08/17/2016   CHOLHDL 3.9 05/13/2014    Significant Diagnostic Results in last 30 days:  No results found.  Assessment/Plan  1. Neurogenic bladder He is on Vesicare for bladder spasms. He has no complaints, nor did his aid or nursing for his.   2. Multiple sclerosis ([place]) He is in the advanced stages of MS, dependent in all ADL.s can feed self, but that is all. He is a long term resident of SNF. He has started to develop muscle contraction and weakness with his neck.   3. Suprapubic catheter ([place]) This is clean and without infection. There are no reports of infection or issues with UTI signs or symptoms at or during today's visit. Continue dressing changes as ordered.  4. Dementia associated with other underlying disease with behavioral disturbance He is on medication to help with his agitation and yelling out. He is been better about this and therefore we will continue his medication.   5. Contracture of neck He continues to have the postural changes  with his head and neck. This appears to be the cause of the shoulder pain.   6. Right sided weakness This is persistent and related to MS. The dependence of this Right arm leads to increase in swelling. Advised aid to keep the arm elevated to prevent this dependent swelling.   7. Nontraumatic shoulder pain, right On exam he demonstrates muscle tightness and pain with his upper and middle trapezius. He has some contracture of his neck and head, his head falls down and is more to the left, which he wears an alignment device to help with head support. He reported during the visit that rubbing of his traps made him feel better. He demonstrated no issues with pain with passive ROM or joint tenderness. PT has been by to help with this contraction in the past. Will order them to reassess the use of the chin support or a head strap. He needs to have the strain taken off of his traps otherwise the pain will reoccur. Will order tylenol scheduled for a few days to help with muscle tightness and pain. Order RN or aide to apply a warm towel or heating pack to the area for 5 minutes, then  massage the base of skull to the lower scapula and tip of shoulder twice daily. Then reapply the heat for 10 minutes. Repeat this for 3-5 days. If there is improvement with this therapy, advise that this should be performed daily. Further order the wheelchair to be reclined one hour  prior to meals to help reduce the strain of his head falling forward. Do this for an hour at a time, three times a day. Spoke with OT they are looking into a new chair to help with reduction of his neck flexion. Will look at getting massage therapist to see him once a week.     Family/ staff Communication: Spoke with nurse and aide  Labs/tests ordered:  none

## 2017-11-20 ENCOUNTER — Encounter: Payer: Self-pay | Admitting: Adult Health

## 2017-11-20 ENCOUNTER — Non-Acute Institutional Stay (SKILLED_NURSING_FACILITY): Payer: Medicare Other | Admitting: Adult Health

## 2017-11-20 DIAGNOSIS — G35 Multiple sclerosis: Secondary | ICD-10-CM | POA: Diagnosis not present

## 2017-11-20 DIAGNOSIS — N319 Neuromuscular dysfunction of bladder, unspecified: Secondary | ICD-10-CM

## 2017-11-20 DIAGNOSIS — E559 Vitamin D deficiency, unspecified: Secondary | ICD-10-CM

## 2017-11-20 DIAGNOSIS — F0281 Dementia in other diseases classified elsewhere with behavioral disturbance: Secondary | ICD-10-CM | POA: Diagnosis not present

## 2017-11-20 DIAGNOSIS — F02818 Dementia in other diseases classified elsewhere, unspecified severity, with other behavioral disturbance: Secondary | ICD-10-CM

## 2017-11-20 DIAGNOSIS — R635 Abnormal weight gain: Secondary | ICD-10-CM | POA: Diagnosis not present

## 2017-11-20 DIAGNOSIS — M436 Torticollis: Secondary | ICD-10-CM | POA: Diagnosis not present

## 2017-11-20 NOTE — Progress Notes (Signed)
Location:  Occupational psychologist of Service:  SNF (31) Provider:   Cindi Carbon, ANP [place] [phone]   [name], DO  Patient Care Team: [name], DO as PCP - General (Geriatric Medicine) [name], MD as Consulting Physician (Neurology) [name], MD as Consulting Physician (Urology) [name], MD as Consulting Physician (General Surgery) [name], MD as Consulting Physician (Orthopedic Surgery) [name], MD as Consulting Physician ([facility]) [name], MD as Consulting Physician (Gastroenterology) [name], DO as Consulting Physician (Geriatric Medicine)  Extended Emergency Contact Information Primary Emergency Contact: [name] Address: [address]          [place], [place] [address] [name] of [place] Phone: [phone] Mobile Phone: [phone] Relation: Spouse  Code Status:  DNR Goals of care: Advanced Directive information Advanced Directives 11/14/2017  Does Patient Have a Medical Advance Directive? Yes  Type of Advance Directive Out of facility DNR (pink MOST or yellow form);[name];Living will  Does patient want to make changes to medical advance directive? No - Patient declined  Copy of [place] in Chart? Yes  Would patient like information on creating a medical advance directive? -  Pre-existing out of facility DNR order (yellow form or pink MOST form) Yellow form placed in chart (order not valid for inpatient use);Pink MOST form placed in chart (order not valid for inpatient use)     Chief Complaint  Patient presents with  . Medical Management of Chronic Issues    HPI:  Pt is a 77 y.o. male seen today for medical management of chronic diseases. He resides in skilled care due to weakness associated from Multiple sclerosis.   MS: He is now a hoyer lift and has right sided weakness. His head  tilts to the left and they use a supportive pillow to help.  No longer on medications.   He was treated for Influenza A on 10/30/17 and pneumonia with Levaquin 500 mg for 7 days CXR 10/30/17: infiltrate to the left lung base and a small left pleural effusion   He has no further cough or congestion and feels like he is back to his usual self. Sats 95% on RA.  Wt Readings from Last 3 Encounters:  11/20/17 199 lb 1.6 oz (90.3 kg)  11/14/17 197 lb (89.4 kg)  10/20/17 195 lb (88.5 kg)  he has been gaining weight over the past few months. The staff report that he tries to eat very quickly unless someone is there to tell him to slow down. He tends to over eat at times.    His agitation has improved. He yells out occasionally per the nurse but overall seems to be more calm with the addition of neurontin for bladder pain and depakote for mood stabilization. Last Dep level 11 08/22/17   MMSE 01/04/17: 10/30 Past Medical History:  Diagnosis Date  . Adenomatous polyp   . Allergy   . BPH (benign prostatic hyperplasia)   . COPD (chronic obstructive pulmonary disease) ([place])   . Dementia 2010  . Elevated PSA 07/31/2014  . Foot drop, right 2008  . 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 Generalized weakness 01/12/2013  . GERD (gastroesophageal reflux disease)   . Hyperlipidemia 07/31/2014  . Multiple sclerosis ([place]) 2008  . Prostatitis, chronic    Past Surgical History:  Procedure Laterality Date  . CATARACT EXTRACTION Left 02/06/12  . CATARACT EXTRACTION Right 2013  .  ESOPHAGOGASTRODUODENOSCOPY ENDOSCOPY  04/16/2002   Dr. Earle Gell  . HERNIA REPAIR  1980'2000   3 inguinal hernia repairs on left  . HERNIA REPAIR  06/16/11   right inguinal hernia repair with mesh   . PROSTATE SURGERY  09/29/2008   Dr. Karsten Ro     Allergies  Allergen Reactions  . Aricept [Donepezil Hcl] Other (See Comments)    Reaction:  Unknown   . Dimethyl Fumarate Nausea And Vomiting    Outpatient Encounter Medications as of  11/20/2017  Medication Sig  . aspirin 81 MG chewable tablet Chew 81 mg by mouth daily.  . Biotin 5 MG TABS Take 5 mg by mouth daily.   . cetirizine (ZYRTEC) 10 MG tablet Take 10 mg by mouth at bedtime.  . cholecalciferol (VITAMIN D) 1000 units tablet Take 2,000 Units by mouth daily.  . ciclopirox (LOPROX) 0.77 % cream Apply 1 application topically 2 (two) times daily. Pt applies to face.  . divalproex (DEPAKOTE SPRINKLE) 125 MG capsule Take 125 mg by mouth 2 (two) times daily.   Marland Kitchen docusate (COLACE) 50 MG/5ML liquid Take 100 mg by mouth daily.  Marland Kitchen gabapentin (NEURONTIN) 100 MG capsule Take 100 mg by mouth 3 (three) times daily.  Marland Kitchen ketoconazole (NIZORAL) 2 % shampoo Apply 1 application topically 2 (two) times a week. Pt uses on Tuesday and Friday.  . magnesium hydroxide (MILK OF MAGNESIA) 400 MG/5ML suspension Take 15 mLs by mouth daily as needed for mild constipation or moderate constipation.   . Melatonin 10 MG TABS Take 1 tablet by mouth at bedtime.  . memantine (NAMENDA) 10 MG tablet Take 10 mg by mouth 2 (two) times daily.  . naproxen sodium (ANAPROX) 220 MG tablet Take 220 mg by mouth every 8 (eight) hours as needed (for discomfort).   Marland Kitchen omeprazole (PRILOSEC) 20 MG capsule Take 20 mg daily by mouth.  . ondansetron (ZOFRAN) 4 MG tablet Take 4 mg every 8 (eight) hours as needed by mouth for nausea or vomiting.  . polyethylene glycol (MIRALAX / GLYCOLAX) packet Take 17 g by mouth every other day.   . sertraline (ZOLOFT) 100 MG tablet Take 100 mg by mouth daily.  . solifenacin (VESICARE) 10 MG tablet Take 10 mg by mouth daily.   No facility-administered encounter medications on file as of 11/20/2017.     Review of Systems  Constitutional: Positive for unexpected weight change. Negative for activity change, appetite change, chills, diaphoresis, fatigue and fever.  HENT: Negative for congestion.   Respiratory: Negative for cough, shortness of breath, wheezing and stridor.   Cardiovascular:  Positive for leg swelling. Negative for chest pain and palpitations.  Gastrointestinal: Negative for abdominal distention, abdominal pain, constipation and diarrhea.  Genitourinary: Positive for frequency (asks to go to the BR frequently but has a SP cath). Negative for difficulty urinating and dysuria.  Musculoskeletal: Positive for arthralgias, gait problem and neck stiffness. Negative for back pain, joint swelling and myalgias.  Neurological: Positive for weakness. Negative for dizziness, seizures, syncope, facial asymmetry, speech difficulty and headaches.  Hematological: Negative for adenopathy. Does not bruise/bleed easily.  Psychiatric/Behavioral: Positive for agitation (periodically) and confusion. Negative for behavioral problems.    Immunization History  Administered Date(s) Administered  . Influenza Inj Mdck Quad Pf 07/07/2016  . Influenza-Unspecified 07/02/2015, 07/13/2017  . Pneumococcal Conjugate-13 02/10/2015  . Pneumococcal Polysaccharide-23 10/23/2006  . Tdap 04/19/2005, 10/25/2016   Pertinent  Health Maintenance Due  Topic Date Due  . INFLUENZA VACCINE  Completed  . PNA  vac Low Risk Adult  Completed   Fall Risk  01/04/2017 07/04/2016 02/17/2016 08/23/2015 08/19/2015  Falls in the past year? No Yes No Yes Yes  Number falls in past yr: - 2 or more - 1 2 or more  Injury with Fall? - Yes - Yes Yes  Comment - - - - had sutures  Risk Factor Category  - High Fall Risk - High Fall Risk -  Risk for fall due to : - History of fall(s);Impaired balance/gait - History of fall(s);Impaired balance/gait;Impaired mobility;Medication side effect;Mental status change -  Follow up - Falls evaluation completed - Falls evaluation completed;Education provided;Falls prevention discussed -   Functional Status Survey:    Vitals:   11/20/17 0944  Pulse: 64  Resp: 20  SpO2: 95%  Weight: 199 lb 1.6 oz (90.3 kg)   Body mass index is 26.27 kg/m. Physical Exam  Constitutional: No distress.   HENT:  Mouth/Throat: No oropharyngeal exudate.  Eyes: Conjunctivae are normal. Pupils are equal, round, and reactive to light. Right eye exhibits no discharge. Left eye exhibits no discharge.  Cardiovascular: Normal rate and regular rhythm.  No murmur heard. Pulmonary/Chest: Effort normal and breath sounds normal. No respiratory distress.  Abdominal: Soft. Bowel sounds are normal. He exhibits no distension. There is no tenderness.  Musculoskeletal:  Decreased ROM to the neck with torticollis to the left. Right sided weakness due to MS  Lymphadenopathy:    He has no cervical adenopathy.  Neurological: He is alert.  Oriented to self and location. Able to f/c.  Pleasant  Skin: Skin is warm and dry. He is not diaphoretic.  Psychiatric: He has a normal mood and affect.  Vitals reviewed.   Labs reviewed: Recent Labs    02/01/17 0600 08/22/17  NA 142 141  K 4.7 4.5  BUN 21 15  CREATININE 0.6 0.6   Recent Labs    02/01/17 0600  AST 17  ALT 14  ALKPHOS 73   Recent Labs    02/01/17 0600 08/22/17  WBC 7.2 9.1  HGB 14.1 14.7  HCT 43 44  PLT 151 176   Lab Results  Component Value Date   TSH 2.56 08/22/2017   Lab Results  Component Value Date   HGBA1C 5.7 (H) 01/13/2013   Lab Results  Component Value Date   CHOL 193 08/17/2016   HDL 46 08/17/2016   LDLCALC 133 08/17/2016   TRIG 86 08/17/2016   CHOLHDL 3.9 05/13/2014    Significant Diagnostic Results in last 30 days:  No results found.  Assessment/Plan 1. Weight gain Likely due to intake and immobility Also on depakote and zoloft which may contribute I have recommended smaller portions. His medications seem to help his behavior so I would hesitate to change them.  2. Multiple sclerosis ([place]) Progressive decline in function over time with right sided weakness and right foot drop He requires a skilled level of care and is hoyer lift. Goals of care are comfort based.  3. Dementia associated with other  underlying disease with behavioral disturbance Progressive decline in cognition. He continues with periods of agitation but the nurse reports they have improved to a degree. He was pleasant for my visit, alert, and not over sedated by any means. Continue current regimen of depakote and zoloft, as well as namenda.   4. Contracture of neck Continue neck support brace  5. Neurogenic bladder Improved Continue neurontin 100 mg TID  6. Vitamin D deficiency Continue Vitamin D 2000 units qd  Family/ staff Communication: discussed with staff/resident  Labs/tests ordered:  NA

## 2017-11-23 DIAGNOSIS — R278 Other lack of coordination: Secondary | ICD-10-CM | POA: Diagnosis not present

## 2017-11-23 DIAGNOSIS — R293 Abnormal posture: Secondary | ICD-10-CM | POA: Diagnosis not present

## 2017-11-23 DIAGNOSIS — G35 Multiple sclerosis: Secondary | ICD-10-CM | POA: Diagnosis not present

## 2017-11-25 DIAGNOSIS — R278 Other lack of coordination: Secondary | ICD-10-CM | POA: Diagnosis not present

## 2017-11-25 DIAGNOSIS — G35 Multiple sclerosis: Secondary | ICD-10-CM | POA: Diagnosis not present

## 2017-11-25 DIAGNOSIS — R293 Abnormal posture: Secondary | ICD-10-CM | POA: Diagnosis not present

## 2017-11-27 DIAGNOSIS — R293 Abnormal posture: Secondary | ICD-10-CM | POA: Diagnosis not present

## 2017-11-27 DIAGNOSIS — R278 Other lack of coordination: Secondary | ICD-10-CM | POA: Diagnosis not present

## 2017-11-27 DIAGNOSIS — G35 Multiple sclerosis: Secondary | ICD-10-CM | POA: Diagnosis not present

## 2017-11-28 DIAGNOSIS — R278 Other lack of coordination: Secondary | ICD-10-CM | POA: Diagnosis not present

## 2017-11-28 DIAGNOSIS — R293 Abnormal posture: Secondary | ICD-10-CM | POA: Diagnosis not present

## 2017-11-28 DIAGNOSIS — G35 Multiple sclerosis: Secondary | ICD-10-CM | POA: Diagnosis not present

## 2017-11-29 DIAGNOSIS — R278 Other lack of coordination: Secondary | ICD-10-CM | POA: Diagnosis not present

## 2017-11-29 DIAGNOSIS — G35 Multiple sclerosis: Secondary | ICD-10-CM | POA: Diagnosis not present

## 2017-11-29 DIAGNOSIS — R293 Abnormal posture: Secondary | ICD-10-CM | POA: Diagnosis not present

## 2017-11-30 DIAGNOSIS — R278 Other lack of coordination: Secondary | ICD-10-CM | POA: Diagnosis not present

## 2017-11-30 DIAGNOSIS — G35 Multiple sclerosis: Secondary | ICD-10-CM | POA: Diagnosis not present

## 2017-11-30 DIAGNOSIS — R293 Abnormal posture: Secondary | ICD-10-CM | POA: Diagnosis not present

## 2017-12-04 DIAGNOSIS — R278 Other lack of coordination: Secondary | ICD-10-CM | POA: Diagnosis not present

## 2017-12-04 DIAGNOSIS — G35 Multiple sclerosis: Secondary | ICD-10-CM | POA: Diagnosis not present

## 2017-12-04 DIAGNOSIS — R293 Abnormal posture: Secondary | ICD-10-CM | POA: Diagnosis not present

## 2017-12-05 DIAGNOSIS — R278 Other lack of coordination: Secondary | ICD-10-CM | POA: Diagnosis not present

## 2017-12-05 DIAGNOSIS — R293 Abnormal posture: Secondary | ICD-10-CM | POA: Diagnosis not present

## 2017-12-05 DIAGNOSIS — G35 Multiple sclerosis: Secondary | ICD-10-CM | POA: Diagnosis not present

## 2017-12-07 DIAGNOSIS — R278 Other lack of coordination: Secondary | ICD-10-CM | POA: Diagnosis not present

## 2017-12-07 DIAGNOSIS — G35 Multiple sclerosis: Secondary | ICD-10-CM | POA: Diagnosis not present

## 2017-12-07 DIAGNOSIS — R293 Abnormal posture: Secondary | ICD-10-CM | POA: Diagnosis not present

## 2017-12-10 DIAGNOSIS — R293 Abnormal posture: Secondary | ICD-10-CM | POA: Diagnosis not present

## 2017-12-10 DIAGNOSIS — R278 Other lack of coordination: Secondary | ICD-10-CM | POA: Diagnosis not present

## 2017-12-10 DIAGNOSIS — G35 Multiple sclerosis: Secondary | ICD-10-CM | POA: Diagnosis not present

## 2017-12-12 DIAGNOSIS — R293 Abnormal posture: Secondary | ICD-10-CM | POA: Diagnosis not present

## 2017-12-12 DIAGNOSIS — G35 Multiple sclerosis: Secondary | ICD-10-CM | POA: Diagnosis not present

## 2017-12-12 DIAGNOSIS — R278 Other lack of coordination: Secondary | ICD-10-CM | POA: Diagnosis not present

## 2017-12-15 DIAGNOSIS — G35 Multiple sclerosis: Secondary | ICD-10-CM | POA: Diagnosis not present

## 2017-12-15 DIAGNOSIS — R293 Abnormal posture: Secondary | ICD-10-CM | POA: Diagnosis not present

## 2017-12-15 DIAGNOSIS — R278 Other lack of coordination: Secondary | ICD-10-CM | POA: Diagnosis not present

## 2017-12-20 DIAGNOSIS — R293 Abnormal posture: Secondary | ICD-10-CM | POA: Diagnosis not present

## 2017-12-20 DIAGNOSIS — G35 Multiple sclerosis: Secondary | ICD-10-CM | POA: Diagnosis not present

## 2017-12-20 DIAGNOSIS — R278 Other lack of coordination: Secondary | ICD-10-CM | POA: Diagnosis not present

## 2017-12-22 DIAGNOSIS — G35 Multiple sclerosis: Secondary | ICD-10-CM | POA: Diagnosis not present

## 2017-12-22 DIAGNOSIS — R278 Other lack of coordination: Secondary | ICD-10-CM | POA: Diagnosis not present

## 2017-12-22 DIAGNOSIS — R293 Abnormal posture: Secondary | ICD-10-CM | POA: Diagnosis not present

## 2017-12-24 DIAGNOSIS — R293 Abnormal posture: Secondary | ICD-10-CM | POA: Diagnosis not present

## 2017-12-24 DIAGNOSIS — G35 Multiple sclerosis: Secondary | ICD-10-CM | POA: Diagnosis not present

## 2017-12-24 DIAGNOSIS — R278 Other lack of coordination: Secondary | ICD-10-CM | POA: Diagnosis not present

## 2018-01-10 ENCOUNTER — Non-Acute Institutional Stay (SKILLED_NURSING_FACILITY): Payer: Medicare Other

## 2018-01-10 DIAGNOSIS — Z0000 Encounter for general adult medical examination without abnormal findings: Secondary | ICD-10-CM

## 2018-01-10 NOTE — Patient Instructions (Signed)
Mr. [name] , Thank you for taking time to come for your Medicare Wellness Visit. I appreciate your ongoing commitment to your health goals. Please review the following plan we discussed and let me know if I can assist you in the future.   Screening recommendations/referrals: Colonoscopy excluded, you are over age 77 Recommended yearly ophthalmology/optometry visit for glaucoma screening and checkup Recommended yearly dental visit for hygiene and checkup  Vaccinations: Influenza vaccine up to date, due 2019 fall season Pneumococcal vaccine up to date, completed Tdap vaccine up to date, due 10/25/2026 Shingles vaccine not in past records    Advanced directives: in chart  Conditions/risks identified: none  Next appointment: Dr. Mariea Clonts makes rounds  Preventive Care 64 Years and Older, Male Preventive care refers to lifestyle choices and visits with your health care provider that can promote health and wellness. What does preventive care include?  A yearly physical exam. This is also called an annual well check.  Dental exams once or twice a year.  Routine eye exams. Ask your health care provider how often you should have your eyes checked.  Personal lifestyle choices, including:  Daily care of your teeth and gums.  Regular physical activity.  Eating a healthy diet.  Avoiding tobacco and drug use.  Limiting alcohol use.  Practicing safe sex.  Taking low doses of aspirin every day.  Taking vitamin and mineral supplements as recommended by your health care provider. What happens during an annual well check? The services and screenings done by your health care provider during your annual well check will depend on your age, overall health, lifestyle risk factors, and family history of disease. Counseling  Your health care provider may ask you questions about your:  Alcohol use.  Tobacco use.  Drug use.  Emotional well-being.  Home and relationship well-being.  Sexual  activity.  Eating habits.  History of falls.  Memory and ability to understand (cognition).  Work and work Statistician. Screening  You may have the following tests or measurements:  Height, weight, and BMI.  Blood pressure.  Lipid and cholesterol levels. These may be checked every 5 years, or more frequently if you are over 87 years old.  Skin check.  Lung cancer screening. You may have this screening every year starting at age 35 if you have a 30-pack-year history of smoking and currently smoke or have quit within the past 15 years.  Fecal occult blood test (FOBT) of the stool. You may have this test every year starting at age 17.  Flexible sigmoidoscopy or colonoscopy. You may have a sigmoidoscopy every 5 years or a colonoscopy every 10 years starting at age 68.  Prostate cancer screening. Recommendations will vary depending on your family history and other risks.  Hepatitis C blood test.  Hepatitis B blood test.  Sexually transmitted disease (STD) testing.  Diabetes screening. This is done by checking your blood sugar (glucose) after you have not eaten for a while (fasting). You may have this done every 1-3 years.  Abdominal aortic aneurysm (AAA) screening. You may need this if you are a current or former smoker.  Osteoporosis. You may be screened starting at age 23 if you are at high risk. Talk with your health care provider about your test results, treatment options, and if necessary, the need for more tests. Vaccines  Your health care provider may recommend certain vaccines, such as:  Influenza vaccine. This is recommended every year.  Tetanus, diphtheria, and acellular pertussis (Tdap, Td) vaccine. You may  need a Td booster every 10 years.  Zoster vaccine. You may need this after age 70.  Pneumococcal 13-valent conjugate (PCV13) vaccine. One dose is recommended after age 60.  Pneumococcal polysaccharide (PPSV23) vaccine. One dose is recommended after age  56. Talk to your health care provider about which screenings and vaccines you need and how often you need them. This information is not intended to replace advice given to you by your health care provider. Make sure you discuss any questions you have with your health care provider. Document Released: 10/02/2015 Document Revised: 05/25/2016 Document Reviewed: 07/07/2015 Elsevier Interactive Patient Education  2017 [place] Prevention in the Home Falls can cause injuries. They can happen to people of all ages. There are many things you can do to make your home safe and to help prevent falls. What can I do on the outside of my home?  Regularly fix the edges of walkways and driveways and fix any cracks.  Remove anything that might make you trip as you walk through a door, such as a raised step or threshold.  Trim any bushes or trees on the path to your home.  Use bright outdoor lighting.  Clear any walking paths of anything that might make someone trip, such as rocks or tools.  Regularly check to see if handrails are loose or broken. Make sure that both sides of any steps have handrails.  Any raised decks and porches should have guardrails on the edges.  Have any leaves, snow, or ice cleared regularly.  Use sand or salt on walking paths during winter.  Clean up any spills in your garage right away. This includes oil or grease spills. What can I do in the bathroom?  Use night lights.  Install grab bars by the toilet and in the tub and shower. Do not use towel bars as grab bars.  Use non-skid mats or decals in the tub or shower.  If you need to sit down in the shower, use a plastic, non-slip stool.  Keep the floor dry. Clean up any water that spills on the floor as soon as it happens.  Remove soap buildup in the tub or shower regularly.  Attach bath mats securely with double-sided non-slip rug tape.  Do not have throw rugs and other things on the floor that can make  you trip. What can I do in the bedroom?  Use night lights.  Make sure that you have a light by your bed that is easy to reach.  Do not use any sheets or blankets that are too big for your bed. They should not hang down onto the floor.  Have a firm chair that has side arms. You can use this for support while you get dressed.  Do not have throw rugs and other things on the floor that can make you trip. What can I do in the kitchen?  Clean up any spills right away.  Avoid walking on wet floors.  Keep items that you use a lot in easy-to-reach places.  If you need to reach something above you, use a strong step stool that has a grab bar.  Keep electrical cords out of the way.  Do not use floor polish or wax that makes floors slippery. If you must use wax, use non-skid floor wax.  Do not have throw rugs and other things on the floor that can make you trip. What can I do with my stairs?  Do not leave any items on  the stairs.  Make sure that there are handrails on both sides of the stairs and use them. Fix handrails that are broken or loose. Make sure that handrails are as long as the stairways.  Check any carpeting to make sure that it is firmly attached to the stairs. Fix any carpet that is loose or worn.  Avoid having throw rugs at the top or bottom of the stairs. If you do have throw rugs, attach them to the floor with carpet tape.  Make sure that you have a light switch at the top of the stairs and the bottom of the stairs. If you do not have them, ask someone to add them for you. What else can I do to help prevent falls?  Wear shoes that:  Do not have high heels.  Have rubber bottoms.  Are comfortable and fit you well.  Are closed at the toe. Do not wear sandals.  If you use a stepladder:  Make sure that it is fully opened. Do not climb a closed stepladder.  Make sure that both sides of the stepladder are locked into place.  Ask someone to hold it for you, if  possible.  Clearly mark and make sure that you can see:  Any grab bars or handrails.  First and last steps.  Where the edge of each step is.  Use tools that help you move around (mobility aids) if they are needed. These include:  Canes.  Walkers.  Scooters.  Crutches.  Turn on the lights when you go into a dark area. Replace any light bulbs as soon as they burn out.  Set up your furniture so you have a clear path. Avoid moving your furniture around.  If any of your floors are uneven, fix them.  If there are any pets around you, be aware of where they are.  Review your medicines with your doctor. Some medicines can make you feel dizzy. This can increase your chance of falling. Ask your doctor what other things that you can do to help prevent falls. This information is not intended to replace advice given to you by your health care provider. Make sure you discuss any questions you have with your health care provider. Document Released: 07/02/2009 Document Revised: 02/11/2016 Document Reviewed: 10/10/2014 Elsevier Interactive Patient Education  2017 [employer].

## 2018-01-10 NOTE — Progress Notes (Addendum)
Subjective:   [name] is a 77 y.o. male who presents for Medicare Annual/Subsequent preventive examination at [facility]  Last AWV-01/04/2017    Objective:    Vitals: BP (!) 118/58 (BP Location: Right Arm, Patient Position: Sitting)   Pulse 64   Temp (!) 97.2 F (36.2 C) (Oral)   Ht 6\' 1"  (1.854 m)   Wt 199 lb (90.3 kg)   SpO2 96%   BMI 26.25 kg/m   Body mass index is 26.25 kg/m.  Advanced Directives 01/10/2018 11/14/2017 09/26/2017 08/01/2017 07/20/2017 06/28/2017 05/30/2017  Does Patient Have a Medical Advance Directive? Yes Yes Yes Yes Yes Yes Yes  Type of Advance Directive Out of facility DNR (pink MOST or yellow form);Living will;Healthcare Power of Attorney Out of facility DNR (pink MOST or yellow form);[place];Living will Out of facility DNR (pink MOST or yellow form);[place];Living will Out of facility DNR (pink MOST or yellow form);[place];Living will Out of facility DNR (pink MOST or yellow form);Living will;Healthcare Power of Attorney Out of facility DNR (pink MOST or yellow form);Living will;Healthcare Power of Attorney Out of facility DNR (pink MOST or yellow form);Living will;Healthcare Power of Attorney  Does patient want to make changes to medical advance directive? No - Patient declined No - Patient declined No - Patient declined No - Patient declined - - -  Copy of [place] in Chart? Yes Yes Yes Yes Yes Yes Yes  Would patient like information on creating a medical advance directive? - - - - - - -  Pre-existing out of facility DNR order (yellow form or pink MOST form) Yellow form placed in chart (order not valid for inpatient use);Pink MOST form placed in chart (order not valid for inpatient use) Yellow form placed in chart (order not valid for inpatient use);Pink MOST form placed in chart (order not valid for inpatient use) Yellow form placed in chart (order not valid for  inpatient use);Pink MOST form placed in chart (order not valid for inpatient use) Pink MOST form placed in chart (order not valid for inpatient use);Yellow form placed in chart (order not valid for inpatient use) Pink MOST form placed in chart (order not valid for inpatient use);Yellow form placed in chart (order not valid for inpatient use) Pink MOST form placed in chart (order not valid for inpatient use);Yellow form placed in chart (order not valid for inpatient use) Pink MOST form placed in chart (order not valid for inpatient use);Yellow form placed in chart (order not valid for inpatient use)    Tobacco Social History   Tobacco Use  Smoking Status Former Smoker  . Packs/day: 0.50  . Years: 20.00  . Pack years: 10.00  . Last attempt to quit: 09/20/1975  . Years since quitting: 42.3  Smokeless Tobacco Never Used     Counseling given: Not Answered   Clinical Intake:  Pre-visit preparation completed: No  Pain : No/denies pain     Nutritional Risks: None Diabetes: No     Interpreter Needed?: No  Information entered by :: Tyson Dense, RN  Past Medical History:  Diagnosis Date  . Adenomatous polyp   . Allergy   . BPH (benign prostatic hyperplasia)   . COPD (chronic obstructive pulmonary disease) ([place])   . Dementia 2010  . Elevated PSA 07/31/2014  . Foot drop, right 2008  . 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 Generalized weakness 01/12/2013  . GERD (gastroesophageal reflux disease)   .  Hyperlipidemia 07/31/2014  . Multiple sclerosis ([place]) 2008  . Prostatitis, chronic    Past Surgical History:  Procedure Laterality Date  . CATARACT EXTRACTION Left 02/06/12  . CATARACT EXTRACTION Right 2013  . ESOPHAGOGASTRODUODENOSCOPY ENDOSCOPY  04/16/2002   Dr. Earle Gell  . HERNIA REPAIR  1980'2000   3 inguinal hernia repairs on left  . HERNIA REPAIR  06/16/11   right inguinal hernia repair with mesh   . PROSTATE SURGERY  09/29/2008   Dr. Karsten Ro    Family History  Problem  Relation Age of Onset  . Depression Mother   . Dementia Father   . Pneumonia Father    Social History   Socioeconomic History  . Marital status: Married    Spouse name: Vickii Chafe  . Number of children: 0  . Years of education: [employer]  . Highest education level: Not on file  Occupational History  . Occupation: Retired Retail buyer  . Financial resource strain: Not hard at all  . Food insecurity:    Worry: Never true    Inability: Never true  . Transportation needs:    Medical: No    Non-medical: No  Tobacco Use  . Smoking status: Former Smoker    Packs/day: 0.50    Years: 20.00    Pack years: 10.00    Last attempt to quit: 09/20/1975    Years since quitting: 42.3  . Smokeless tobacco: Never Used  Substance and Sexual Activity  . Alcohol use: Yes    Comment: a social drink once a week  . Drug use: No  . Sexual activity: Never  Lifestyle  . Physical activity:    Days per week: 0 days    Minutes per session: 0 min  . Stress: Not at all  Relationships  . Social connections:    Talks on phone: Three times a week    Gets together: Three times a week    Attends religious service: Never    Active member of club or organization: No    Attends meetings of clubs or organizations: Never    Relationship status: Married  Other Topics Concern  . Not on file  Social History Narrative   Pt lives at [facility] and moved in 07/29/2014.   Spouse Peggy    Caffeine: Quit in 2003   Stopped smoking 1977   Exercise none   POA, Living Will    Outpatient Encounter Medications as of 01/10/2018  Medication Sig  . aspirin 81 MG chewable tablet Chew 81 mg by mouth daily.  . Biotin 5 MG TABS Take 5 mg by mouth daily.   . cetirizine (ZYRTEC) 10 MG tablet Take 10 mg by mouth at bedtime.  . cholecalciferol (VITAMIN D) 1000 units tablet Take 2,000 Units by mouth daily.  . ciclopirox (LOPROX) 0.77 % cream Apply 1 application topically 2 (two) times daily. Pt applies to face.  .  divalproex (DEPAKOTE SPRINKLE) 125 MG capsule Take 125 mg by mouth 2 (two) times daily.   Marland Kitchen docusate (COLACE) 50 MG/5ML liquid Take 100 mg by mouth daily.  Marland Kitchen gabapentin (NEURONTIN) 100 MG capsule Take 100 mg by mouth 3 (three) times daily.  Marland Kitchen ketoconazole (NIZORAL) 2 % shampoo Apply 1 application topically 2 (two) times a week. Pt uses on Tuesday and Friday.  . magnesium hydroxide (MILK OF MAGNESIA) 400 MG/5ML suspension Take 15 mLs by mouth daily as needed for mild constipation or moderate constipation.   . Melatonin 10 MG TABS Take 1 tablet  by mouth at bedtime.  . memantine (NAMENDA) 10 MG tablet Take 10 mg by mouth 2 (two) times daily.  . naproxen sodium (ANAPROX) 220 MG tablet Take 220 mg by mouth every 8 (eight) hours as needed (for discomfort).   Marland Kitchen omeprazole (PRILOSEC) 20 MG capsule Take 20 mg daily by mouth.  . ondansetron (ZOFRAN) 4 MG tablet Take 4 mg every 8 (eight) hours as needed by mouth for nausea or vomiting.  . polyethylene glycol (MIRALAX / GLYCOLAX) packet Take 17 g by mouth every other day.   . sertraline (ZOLOFT) 100 MG tablet Take 100 mg by mouth daily.  . solifenacin (VESICARE) 10 MG tablet Take 10 mg by mouth daily.   No facility-administered encounter medications on file as of 01/10/2018.     Activities of Daily Living In your present state of health, do you have any difficulty performing the following activities: 01/10/2018  Hearing? N  Vision? N  Difficulty concentrating or making decisions? Y  Walking or climbing stairs? Y  Dressing or bathing? Y  Doing errands, shopping? Y  Preparing Food and eating ? Y  Using the Toilet? Y  In the past six months, have you accidently leaked urine? Y  Do you have problems with loss of bowel control? Y  Managing your Medications? Y  Managing your Finances? Y  Housekeeping or managing your Housekeeping? Y  Some recent data might be hidden    Patient Care Team: [name], DO as PCP - General (Geriatric  Medicine) [name], MD as Consulting Physician (Neurology) [name], MD as Consulting Physician (Urology) [name], MD as Consulting Physician (General Surgery) [name], MD as Consulting Physician (Orthopedic Surgery) [name], MD as Consulting Physician ([facility]) [name], MD as Consulting Physician (Gastroenterology) [name], DO as Consulting Physician (Geriatric Medicine)   Assessment:   This is a routine wellness examination for [facility].  Exercise Activities and Dietary recommendations Current Exercise Habits: The patient does not participate in regular exercise at present, Exercise limited by: orthopedic condition(s)  Goals    None      Fall Risk Fall Risk  01/10/2018 01/04/2017 07/04/2016 02/17/2016 08/23/2015  Falls in the past year? No No Yes No Yes  Number falls in past yr: - - 2 or more - 1  Injury with Fall? - - Yes - Yes  Comment - - - - -  Risk Factor Category  - - High Fall Risk - High Fall Risk  Risk for fall due to : - - History of fall(s);Impaired balance/gait - History of fall(s);Impaired balance/gait;Impaired mobility;Medication side effect;Mental status change  Follow up - - Falls evaluation completed - Falls evaluation completed;Education provided;Falls prevention discussed   Is the patient's home free of loose throw rugs in walkways, pet beds, electrical cords, etc?   yes      Grab bars in the bathroom? yes      Handrails on the stairs?   yes      Adequate lighting?   yes  Depression Screen PHQ 2/9 Scores 01/10/2018 01/04/2017 07/04/2016 02/17/2016  PHQ - 2 Score 0 0 0 0  PHQ- 9 Score - - - -    Cognitive Function: completed within last year MMSE - Mini Mental State Exam 01/04/2018 01/04/2017 02/17/2016 02/05/2015  Not completed: (No Data) Refused - -  Orientation to time 2 0 2 0  Orientation to Place 2 3 4 4   Registration 3 3 3 3   Attention/ Calculation  1 0 5 5  Recall 0 0 0 0  Language- name  2 objects 2 2 2 2   Language- repeat 1 1 1 1   Language- follow 3 step command 0 3 2 3   Language- read & follow direction 1 1 1 1   Write a sentence 0 0 1 1  Copy design 0 0 1 1  Total score 12 13 22 21         Immunization History  Administered Date(s) Administered  . Influenza Inj Mdck Quad Pf 07/07/2016  . Influenza-Unspecified 07/02/2015, 07/13/2017  . Pneumococcal Conjugate-13 02/10/2015  . Pneumococcal Polysaccharide-23 10/23/2006  . Tdap 04/19/2005, 10/25/2016    Qualifies for Shingles Vaccine? Not in past records  Screening Tests Health Maintenance  Topic Date Due  . INFLUENZA VACCINE  04/19/2018  . TETANUS/TDAP  10/25/2026  . PNA vac Low Risk Adult  Completed   Cancer Screenings: Lung: Low Dose CT Chest recommended if Age 40-80 years, 30 pack-year currently smoking OR have quit w/in 15years. Patient does not qualify. Colorectal: up to date  Additional Screenings:  Hepatitis C Screening: declined      Plan:    I have personally reviewed and addressed the Medicare Annual Wellness questionnaire and have noted the following in the patient's chart:  A. Medical and social history B. Use of alcohol, tobacco or illicit drugs  C. Current medications and supplements D. Functional ability and status E.  Nutritional status F.  Physical activity G. Advance directives H. List of other physicians I.  Hospitalizations, surgeries, and ER visits in previous 12 months J.  [facility] to include hearing, vision, cognitive, depression L. Referrals and appointments - none  In addition, I have reviewed and discussed with patient certain preventive protocols, quality metrics, and best practice recommendations. A written personalized care plan for preventive services as well as general preventive health recommendations were provided to patient.  See attached scanned questionnaire for additional information.   Signed,   Tyson Dense, RN Nurse Health Advisor  Patient  Concerns: L eye redness and red spot on eyelid that has been itchy since Monday. Patient stated it is slowly getting better

## 2018-01-16 ENCOUNTER — Encounter: Payer: Self-pay | Admitting: Internal Medicine

## 2018-01-16 ENCOUNTER — Non-Acute Institutional Stay (SKILLED_NURSING_FACILITY): Payer: Medicare Other | Admitting: Internal Medicine

## 2018-01-16 DIAGNOSIS — R269 Unspecified abnormalities of gait and mobility: Secondary | ICD-10-CM | POA: Diagnosis not present

## 2018-01-16 DIAGNOSIS — F0281 Dementia in other diseases classified elsewhere with behavioral disturbance: Secondary | ICD-10-CM | POA: Diagnosis not present

## 2018-01-16 DIAGNOSIS — K5904 Chronic idiopathic constipation: Secondary | ICD-10-CM | POA: Diagnosis not present

## 2018-01-16 DIAGNOSIS — G35 Multiple sclerosis: Secondary | ICD-10-CM

## 2018-01-16 DIAGNOSIS — M436 Torticollis: Secondary | ICD-10-CM | POA: Diagnosis not present

## 2018-01-16 DIAGNOSIS — N319 Neuromuscular dysfunction of bladder, unspecified: Secondary | ICD-10-CM

## 2018-01-16 DIAGNOSIS — Z9359 Other cystostomy status: Secondary | ICD-10-CM

## 2018-01-16 DIAGNOSIS — F02818 Dementia in other diseases classified elsewhere, unspecified severity, with other behavioral disturbance: Secondary | ICD-10-CM

## 2018-01-16 NOTE — Progress Notes (Addendum)
Patient ID: [name], male   DOB: February 26, 1941, 77 y.o.   MRN: [id number]  Location:  [facility] Room Number: 129 Place of Service:  SNF (31) Provider:   [name], DO  Patient Care Team: [name], DO as PCP - General (Geriatric Medicine) [name], MD as Consulting Physician (Neurology) [name], MD as Consulting Physician (Urology) [name], MD as Consulting Physician (General Surgery) [name], MD as Consulting Physician (Orthopedic Surgery) [name], MD as Consulting Physician ([facility]) [name], MD as Consulting Physician (Gastroenterology) [name], DO as Consulting Physician (Geriatric Medicine)  Extended Emergency Contact Information Primary Emergency Contact: Dung,Peggy Address: [address]          [place], [place] [address] [name] of [place] Phone: [phone] Mobile Phone: [phone] Relation: Spouse  Code Status:  DNR Goals of care: Advanced Directive information Advanced Directives 01/16/2018  Does Patient Have a Medical Advance Directive? Yes  Type of Paramedic of [place];Living will;Out of facility DNR (pink MOST or yellow form)  Does patient want to make changes to medical advance directive? No - Patient declined  Copy of [place] in Chart? Yes  Would patient like information on creating a medical advance directive? -  Pre-existing out of facility DNR order (yellow form or pink MOST form) Yellow form placed in chart (order not valid for inpatient use);Pink MOST form placed in chart (order not valid for inpatient use)     Chief Complaint  Patient presents with  . Medical Management of Chronic Issues    Routine Visit    HPI:  Pt is a 77 y.o. 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  His caregiver was with him.  He was watching [place] movies.  His chalazion on his eye was better.  He is now only hollering when he needs to have a BM per the day nurse.  His wife notes he sleeps more, but he only takes short naps per staff.    Over the past year, his mobility has declined and he's no longer able to transfer using a pole in his room like he once did--requires a hoyer.  He's been struggling with neck spasms and contractures. He has a specialty brace and pillow for support.     He had the catheter placed at least a year ago due to his frequency and discomfort which has improved.    He had the flu in feb with bacterial pneumonia, as well.    Constipation:  Bowels generally move well per caregiver with qod miralax and daily colace plus prn milk of mag.    He continues to have prn anaprox plus regular gabapentin for "bladder pain/spams".  He's also on vesicare for this.  It seems the gabapentin has helped b/c he calls out less about needing to use the restroom which he seemed to do when he had the discomfort.    Dementia:  Progressing gradually.  He's on namenda and has depakote for behaviors and zoloft for depression. He's doing better recently from the behavior perspective as above.  MMSE 01/04/18 was  12/30.  GERD:  Remains on prilosec once a day.  Past Medical History:  Diagnosis Date  . Adenomatous polyp   . Allergy   . BPH (benign prostatic hyperplasia)   . COPD (chronic obstructive pulmonary disease) ([place])   . Dementia 2010  . Elevated PSA 07/31/2014  . Foot drop, right 2008  . 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 Generalized weakness 01/12/2013  . GERD (gastroesophageal reflux disease)   . Hyperlipidemia 07/31/2014  . Multiple sclerosis ([place]) 2008  . Prostatitis, chronic    Past Surgical History:    Procedure Laterality Date  . CATARACT EXTRACTION Left 02/06/12  . CATARACT EXTRACTION Right 2013  . ESOPHAGOGASTRODUODENOSCOPY ENDOSCOPY  04/16/2002   Dr. Earle Gell  . HERNIA REPAIR  1980'2000   3 inguinal hernia repairs on left  . HERNIA REPAIR  06/16/11   right inguinal hernia repair with mesh   . PROSTATE SURGERY  09/29/2008   Dr. Karsten Ro     Allergies  Allergen Reactions  . Aricept [Donepezil Hcl] Other (See Comments)    Reaction:  Unknown   . Dimethyl Fumarate Nausea And Vomiting    Outpatient Encounter Medications as of 01/16/2018  Medication Sig  . aspirin 81 MG chewable tablet Chew 81 mg by mouth daily.  . Biotin 5 MG TABS Take 5 mg by mouth daily.   . cetirizine (ZYRTEC) 10 MG tablet Take 10 mg by mouth at bedtime.  . cholecalciferol (VITAMIN D) 1000 units tablet Take 2,000 Units by mouth daily.  . ciclopirox (LOPROX) 0.77 % cream Apply 1 application topically 2 (two) times daily. Pt applies to face.  . divalproex (DEPAKOTE SPRINKLE) 125 MG capsule Take 125 mg by mouth 2 (two) times daily.   Marland Kitchen docusate (COLACE) 50 MG/5ML liquid Take 100 mg by mouth daily.  Marland Kitchen gabapentin (NEURONTIN) 100 MG capsule Take 100 mg by mouth 3 (three) times daily.  Marland Kitchen ketoconazole (NIZORAL) 2 % shampoo Apply 1 application topically 2 (two) times a week. Pt uses on Tuesday and Friday.  . magnesium hydroxide (MILK OF MAGNESIA) 400 MG/5ML suspension Take 15 mLs by mouth daily as needed for mild constipation or moderate constipation.   . Melatonin 10 MG TABS Take 1 tablet by mouth at bedtime.  . memantine (NAMENDA) 10 MG tablet Take 10 mg by mouth 2 (two) times daily.  . naproxen sodium (ANAPROX) 220 MG tablet Take 220 mg by mouth every 8 (eight) hours as needed (for discomfort).   Marland Kitchen omeprazole (PRILOSEC) 20 MG capsule Take 20 mg daily by mouth.  . ondansetron (ZOFRAN) 4 MG tablet Take 4 mg every 8 (eight) hours as needed by mouth for nausea or vomiting.  . polyethylene glycol (MIRALAX / GLYCOLAX)  packet Take 17 g by mouth every other day.   . sertraline (ZOLOFT) 100 MG tablet Take 100 mg by mouth daily.  . solifenacin (VESICARE) 10 MG tablet Take 10 mg by mouth daily.   No facility-administered encounter medications on file as of 01/16/2018.     Review of Systems  Constitutional: Positive for fatigue. Negative for activity change, appetite change, chills and fever.  HENT: Negative for congestion.   Eyes: Negative for visual disturbance.       Glasses  Respiratory: Negative for chest tightness and shortness of breath.   Cardiovascular: Negative for chest pain, palpitations and leg swelling.  Gastrointestinal: Positive for constipation. Negative for diarrhea and nausea.  Genitourinary: Negative for dysuria.  Musculoskeletal: Positive for gait problem. Negative for joint swelling, myalgias and neck pain.       Neck contracture  Neurological: Negative for dizziness and weakness.  Hematological: Does not bruise/bleed easily.  Psychiatric/Behavioral: Positive for behavioral problems and confusion. Negative for agitation and sleep disturbance. The patient is not nervous/anxious.     Immunization History  Administered Date(s) Administered  . Influenza Inj Mdck Quad Pf 07/07/2016  . Influenza-Unspecified 07/02/2015, 07/13/2017  . Pneumococcal Conjugate-13 02/10/2015  . Pneumococcal Polysaccharide-23 10/23/2006  . Tdap 04/19/2005, 10/25/2016   Pertinent  Health Maintenance Due  Topic Date Due  . INFLUENZA VACCINE  04/19/2018  . PNA vac Low Risk Adult  Completed   Fall Risk  01/10/2018 01/04/2017 07/04/2016 02/17/2016 08/23/2015  Falls in the past year? No No Yes No Yes  Number falls in past yr: - - 2 or more - 1  Injury with Fall? - - Yes - Yes  Comment - - - - -  Risk Factor Category  - - High Fall Risk - High Fall Risk  Risk for fall due to : - - History of fall(s);Impaired balance/gait - History of fall(s);Impaired balance/gait;Impaired mobility;Medication side effect;Mental  status change  Follow up - - Falls evaluation completed - Falls evaluation completed;Education provided;Falls prevention discussed   Functional Status Survey:    Vitals:   01/16/18 1137  BP: 113/72  Pulse: 69  Resp: 18  Temp: 97.7 F (36.5 C)  TempSrc: Oral  SpO2: 96%  Weight: 197 lb (89.4 kg)  Height: 6\' 1"  (1.854 m)   Body mass index is 25.99 kg/m. Physical Exam  Constitutional: He appears well-developed and well-nourished. No distress.  Eyes: Pupils are equal, round, and reactive to light.  glasses  Cardiovascular: Normal rate, regular rhythm, normal heart sounds and intact distal pulses.  Pulmonary/Chest: Effort normal and breath sounds normal. No respiratory distress.  Abdominal: Bowel sounds are normal. He exhibits no distension. There is no tenderness.  Genitourinary:  Genitourinary Comments: Suprapubic catheter in place, leg bag with dark yellow urine  Musculoskeletal:  Foot drop and mild right sided weakness  Neurological: He is alert.  Skin: Skin is warm and dry. Capillary refill takes less than 2 seconds.    Labs reviewed: Recent Labs    02/01/17 0600 08/22/17  NA 142 141  K 4.7 4.5  BUN 21 15  CREATININE 0.6 0.6   Recent Labs    02/01/17 0600  AST 17  ALT 14  ALKPHOS 73   Recent Labs    02/01/17 0600 08/22/17  WBC 7.2 9.1  HGB 14.1 14.7  HCT 43 44  PLT 151 176   Lab Results  Component Value Date   TSH 2.56 08/22/2017   Lab Results  Component Value Date   HGBA1C 5.7 (H) 01/13/2013   Lab Results  Component Value Date   CHOL 193 08/17/2016   HDL 46 08/17/2016   LDLCALC 133 08/17/2016   TRIG 86 08/17/2016   CHOLHDL 3.9 05/13/2014   Assessment/Plan 2. Multiple sclerosis ([place]) -not on specific meds for this -requiring SNF care with hoyer lift at this point with right sided weakness mild and foot drop, worsening head drop with contracture  3. Dementia associated with other underlying disease with behavioral disturbance -cognition  gradually declining with mmse down another point this past year MMSE - Mini Mental State Exam 01/04/2018 01/04/2017 02/17/2016  Not completed: (No Data) Refused -  Orientation to time 2 0 2  Orientation to Place 2 3 4   Registration 3 3 3   Attention/ Calculation 1 0 5  Recall 0 0 0  Language- name 2 objects 2  2 2  Language- repeat 1 1 1   Language- follow 3 step command 0 3 2  Language- read & follow direction 1 1 1   Write a sentence 0 0 1  Copy design 0 0 1  Total score 12 13 22   -remains on namenda--may consider taper off if his wife is ok with this due to his significant functional dependence at this point and progressing cognitive loss  4. Contracture of neck -ongoing, but pain aspect better, cont his brace and pillow for support  5. Neurogenic bladder -cont suprapubic cath with regular changes by nursing staff plus his vesicare, gabapentin for the bladder spasms and prn aleve   6. Suprapubic catheter ([place]) -remains necessary  7. Gait disorder -cont hoyer lift for transfers and specialty wheelchair and additional caregiver assistance   8. Chronic idiopathic constipation -stable with current regimen, no changes made  Family/ staff Communication: discussed with SNF nurse and pt's caregiver  Labs/tests ordered:  No new today  Anthonio Mizzell L. Lalaine Overstreet, D.O. [facility] [address], [place] [address] Cell Phone (Mon-Fri 8am-5pm):  [phone] On Call:  [phone] & follow prompts after 5pm & weekends Office Phone:  [phone] Office Fax:  [phone]

## 2018-01-26 NOTE — Addendum Note (Signed)
Addended by: [name] on: 01/26/2018 12:33 PM   Modules accepted: Level of Service

## 2018-02-09 ENCOUNTER — Encounter: Payer: Self-pay | Admitting: Adult Health

## 2018-02-09 ENCOUNTER — Non-Acute Institutional Stay (SKILLED_NURSING_FACILITY): Payer: Medicare Other | Admitting: Adult Health

## 2018-02-09 DIAGNOSIS — J309 Allergic rhinitis, unspecified: Secondary | ICD-10-CM | POA: Diagnosis not present

## 2018-02-09 DIAGNOSIS — F0281 Dementia in other diseases classified elsewhere with behavioral disturbance: Secondary | ICD-10-CM | POA: Diagnosis not present

## 2018-02-09 DIAGNOSIS — L219 Seborrheic dermatitis, unspecified: Secondary | ICD-10-CM

## 2018-02-09 DIAGNOSIS — H1013 Acute atopic conjunctivitis, bilateral: Secondary | ICD-10-CM | POA: Diagnosis not present

## 2018-02-09 DIAGNOSIS — N319 Neuromuscular dysfunction of bladder, unspecified: Secondary | ICD-10-CM

## 2018-02-09 DIAGNOSIS — G35 Multiple sclerosis: Secondary | ICD-10-CM | POA: Diagnosis not present

## 2018-02-09 DIAGNOSIS — F02818 Dementia in other diseases classified elsewhere, unspecified severity, with other behavioral disturbance: Secondary | ICD-10-CM

## 2018-02-09 DIAGNOSIS — H1010 Acute atopic conjunctivitis, unspecified eye: Secondary | ICD-10-CM | POA: Insufficient documentation

## 2018-02-09 NOTE — Assessment & Plan Note (Signed)
Continue zyrtec 10 mg qhs.

## 2018-02-09 NOTE — Progress Notes (Signed)
Location:  Occupational psychologist of Service:  SNF (31) Provider:   Cindi Carbon, ANP [place] [phone]   [name], DO  Patient Care Team: [name], DO as PCP - General (Geriatric Medicine) [name], MD as Consulting Physician (Neurology) [name], MD as Consulting Physician (Urology) [name], MD as Consulting Physician (General Surgery) [name], MD as Consulting Physician (Orthopedic Surgery) [name], MD as Consulting Physician ([facility]) [name], MD as Consulting Physician (Gastroenterology) [name], DO as Consulting Physician (Geriatric Medicine)  Extended Emergency Contact Information Primary Emergency Contact: Hartnett,Peggy Address: [address]          [place], [place] [address] [name] of [place] Phone: [phone] Mobile Phone: [phone] Relation: Spouse  Code Status:  DNR Goals of care: Advanced Directive information Advanced Directives 01/16/2018  Does Patient Have a Medical Advance Directive? Yes  Type of Paramedic of [place];Living will;Out of facility DNR (pink MOST or yellow form)  Does patient want to make changes to medical advance directive? No - Patient declined  Copy of [place] in Chart? Yes  Would patient like information on creating a medical advance directive? -  Pre-existing out of facility DNR order (yellow form or pink MOST form) Yellow form placed in chart (order not valid for inpatient use);Pink MOST form placed in chart (order not valid for inpatient use)     Chief Complaint  Patient presents with  . Medical Management of Chronic Issues    HPI:  Pt is a 77 y.o. male seen today for medical management of chronic diseases. Her resides in skilled care due to [place] which has progressed. He has right sided weakness and is chair bound.    His wife requested that I review his  medications and discontinue any meds that are not necessary.   Memory loss: He is currently on namenda but is dependent on staff for all ADLs except feeding. He can feed himself but needs encouragement and assistance with set up. He has been eating well and gaining weight over time. Small portions are ordered. He is on depakote for agitation. There are very few episodes of agitation per the staff which is improved.  MMSE 12/30 01/04/2018  Bladder pain: continues on neurontin which has helped tremendously per the staff. He only occasionally states that his bladder is hurting or has urgency.    He was started on pataday drops this week due to itchy eyes and this improved. He is on on zyrtec for itchy nosed and eyes.  Seborrhea: improved with current regimen  Past Medical History:  Diagnosis Date  . Adenomatous polyp   . Allergy   . BPH (benign prostatic hyperplasia)   . COPD (chronic obstructive pulmonary disease) ([place])   . Dementia 2010  . Elevated PSA 07/31/2014  . Foot drop, right 2008  . 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 Generalized weakness 01/12/2013  . GERD (gastroesophageal reflux disease)   . Hyperlipidemia 07/31/2014  . Multiple sclerosis ([place]) 2008  . Prostatitis, chronic    Past Surgical History:  Procedure Laterality Date  . CATARACT EXTRACTION Left 02/06/12  . CATARACT EXTRACTION Right 2013  . ESOPHAGOGASTRODUODENOSCOPY ENDOSCOPY  04/16/2002   Dr. Earle Gell  . HERNIA REPAIR  1980'2000   3 inguinal hernia repairs on left  . HERNIA REPAIR  06/16/11   right inguinal hernia repair with mesh   . PROSTATE SURGERY  09/29/2008   Dr. Karsten Ro     Allergies  Allergen Reactions  . Aricept [Donepezil Hcl] Other (See Comments)    Reaction:  Unknown   . Dimethyl Fumarate Nausea And Vomiting    Outpatient Encounter Medications as of 02/09/2018  Medication Sig  . aspirin 81 MG chewable tablet Chew 81 mg by mouth daily.  . Biotin 5 MG TABS Take 5 mg by mouth daily.   .  cetirizine (ZYRTEC) 10 MG tablet Take 10 mg by mouth at bedtime.  . cholecalciferol (VITAMIN D) 1000 units tablet Take 2,000 Units by mouth daily.  . ciclopirox (LOPROX) 0.77 % cream Apply 1 application topically 2 (two) times daily. Pt applies to face.  . divalproex (DEPAKOTE SPRINKLE) 125 MG capsule Take 125 mg by mouth 2 (two) times daily.   Marland Kitchen docusate (COLACE) 50 MG/5ML liquid Take 100 mg by mouth daily.  Marland Kitchen gabapentin (NEURONTIN) 100 MG capsule Take 100 mg by mouth 3 (three) times daily.  Marland Kitchen ketoconazole (NIZORAL) 2 % shampoo Apply 1 application topically 2 (two) times a week. Pt uses on Tuesday and Friday.  . magnesium hydroxide (MILK OF MAGNESIA) 400 MG/5ML suspension Take 15 mLs by mouth daily as needed for mild constipation or moderate constipation.   . Melatonin 10 MG TABS Take 1 tablet by mouth at bedtime.  . memantine (NAMENDA) 10 MG tablet Take 10 mg by mouth 2 (two) times daily.  . naproxen sodium (ANAPROX) 220 MG tablet Take 220 mg by mouth every 8 (eight) hours as needed (for discomfort).   . Olopatadine HCl (PATADAY) 0.2 % SOLN Apply 1 drop to eye daily.  Marland Kitchen omeprazole (PRILOSEC) 20 MG capsule Take 20 mg daily by mouth.  . ondansetron (ZOFRAN) 4 MG tablet Take 4 mg every 8 (eight) hours as needed by mouth for nausea or vomiting.  . polyethylene glycol (MIRALAX / GLYCOLAX) packet Take 17 g by mouth every other day.   . sertraline (ZOLOFT) 100 MG tablet Take 100 mg by mouth daily.  . solifenacin (VESICARE) 10 MG tablet Take 10 mg by mouth daily.   No facility-administered encounter medications on file as of 02/09/2018.     Review of Systems  Constitutional: Negative for activity change, appetite change, chills, diaphoresis, fatigue, fever and unexpected weight change.  HENT: Negative for trouble swallowing.   Eyes: Negative for itching (resolved) and visual disturbance.  Respiratory: Negative for cough, shortness of breath, wheezing and stridor.   Cardiovascular: Positive for leg  swelling. Negative for chest pain and palpitations.  Gastrointestinal: Negative for abdominal distention, abdominal pain, constipation and diarrhea.  Genitourinary: Positive for urgency. Negative for difficulty urinating, discharge, dysuria, flank pain, hematuria, scrotal swelling and testicular pain.       Bladder pain at times, less frequent now  Musculoskeletal: Positive for gait problem. Negative for arthralgias, back pain, joint swelling and myalgias.  Skin: Positive for rash.  Neurological: Positive for weakness (right sided). Negative for dizziness, seizures, syncope, facial asymmetry, speech difficulty and headaches.  Hematological: Negative for adenopathy. Does not bruise/bleed easily.  Psychiatric/Behavioral: Positive for agitation and confusion. Negative for behavioral problems.    Immunization History  Administered Date(s) Administered  . Influenza Inj Mdck Quad Pf 07/07/2016  . Influenza-Unspecified 07/02/2015, 07/13/2017  . Pneumococcal Conjugate-13 02/10/2015  . Pneumococcal Polysaccharide-23 10/23/2006  . Tdap 04/19/2005, 10/25/2016   Pertinent  Health Maintenance Due  Topic Date Due  . INFLUENZA VACCINE  04/19/2018  . PNA vac Low Risk Adult  Completed   Fall  Risk  01/10/2018 01/04/2017 07/04/2016 02/17/2016 08/23/2015  Falls in the past year? No No Yes No Yes  Number falls in past yr: - - 2 or more - 1  Injury with Fall? - - Yes - Yes  Comment - - - - -  Risk Factor Category  - - High Fall Risk - High Fall Risk  Risk for fall due to : - - History of fall(s);Impaired balance/gait - History of fall(s);Impaired balance/gait;Impaired mobility;Medication side effect;Mental status change  Follow up - - Falls evaluation completed - Falls evaluation completed;Education provided;Falls prevention discussed   Functional Status Survey:    Vitals:   02/09/18 1057  Weight: 199 lb 11.2 oz (90.6 kg)   Body mass index is 26.35 kg/m.  Wt Readings from Last 3 Encounters:    02/09/18 199 lb 11.2 oz (90.6 kg)  01/16/18 197 lb (89.4 kg)  01/10/18 199 lb (90.3 kg)   Physical Exam  Constitutional: No distress.  HENT:  Head: Normocephalic and atraumatic.  Eyes: Pupils are equal, round, and reactive to light. Conjunctivae are normal. Right eye exhibits no discharge. Left eye exhibits no discharge.  Neck: No JVD present. No tracheal deviation present. No thyromegaly present.  Decreased ROM to the neck, support strap in place  Cardiovascular: Normal rate and regular rhythm.  No murmur heard. Pulmonary/Chest: Effort normal and breath sounds normal. No respiratory distress. He has no wheezes.  Abdominal: Soft. Bowel sounds are normal. He exhibits no distension. There is no tenderness.  Lymphadenopathy:    He has no cervical adenopathy.  Neurological: He is alert.  Right sided weakness due to MS. Oriented to self and place but not time. Pleasant and able to f/c.  Skin: Skin is warm and dry. He is not diaphoretic.  Dry flaky skin to ears and eye brows, improved to face  Psychiatric: He has a normal mood and affect.    Labs reviewed: Recent Labs    08/22/17  NA 141  K 4.5  BUN 15  CREATININE 0.6   No results for input(s): AST, ALT, ALKPHOS, BILITOT, PROT, ALBUMIN in the last 8760 hours. Recent Labs    08/22/17  WBC 9.1  HGB 14.7  HCT 44  PLT 176   Lab Results  Component Value Date   TSH 2.56 08/22/2017   Lab Results  Component Value Date   HGBA1C 5.7 (H) 01/13/2013   Lab Results  Component Value Date   CHOL 193 08/17/2016   HDL 46 08/17/2016   LDLCALC 133 08/17/2016   TRIG 86 08/17/2016   CHOLHDL 3.9 05/13/2014    Significant Diagnostic Results in last 30 days:  No results found.  Assessment/Plan  Dementia with behavioral disturbance Overall improved behaviors. Would not taper depakote.  Given his skilled care status and dependency on staff it would be reasonable to discontinue the Namenda as he likely does not benefit.   Seborrheic  dermatitis Improved, continue ciclopirox and nizoral.   Allergic rhinitis Continue zyrtec 10 mg qhs.   Allergic conjunctivitis and rhinitis Improved. Continue Zyrtec and Pataday gtts.   Multiple sclerosis Progressive decline in function, with right sided weakness. No longer on medications or visits the neurologist due to lack of benefit.  Neurogenic bladder Pain controlled with neurontin 100 mg TID.   His wife sent a request to the nurse to reduce his pill burden. Upon review of his medications, I discontinued the aspirin, namenda, biotin, and Vit D due to lack of benefit/pill burden   Family/ staff Communication:  caretaker, nurse, resident  Labs/tests ordered:  NA, Depakote level due in June

## 2018-02-09 NOTE — Assessment & Plan Note (Signed)
Pain controlled with neurontin 100 mg TID.

## 2018-02-09 NOTE — Assessment & Plan Note (Signed)
Improved, continue ciclopirox and nizoral.

## 2018-02-09 NOTE — Assessment & Plan Note (Signed)
Improved. Continue Zyrtec and Pataday gtts.

## 2018-02-09 NOTE — Assessment & Plan Note (Signed)
Progressive decline in function, with right sided weakness. No longer on medications or visits the neurologist due to lack of benefit.

## 2018-02-09 NOTE — Assessment & Plan Note (Signed)
Overall improved behaviors. Would not taper depakote.  Given his skilled care status and dependency on staff it would be reasonable to discontinue the Namenda as he likely does not benefit.

## 2018-03-16 ENCOUNTER — Encounter: Payer: Self-pay | Admitting: Adult Health

## 2018-03-16 ENCOUNTER — Non-Acute Institutional Stay (SKILLED_NURSING_FACILITY): Payer: Medicare Other | Admitting: Adult Health

## 2018-03-16 DIAGNOSIS — G35 Multiple sclerosis: Secondary | ICD-10-CM | POA: Diagnosis not present

## 2018-03-16 DIAGNOSIS — F02818 Dementia in other diseases classified elsewhere, unspecified severity, with other behavioral disturbance: Secondary | ICD-10-CM

## 2018-03-16 DIAGNOSIS — K219 Gastro-esophageal reflux disease without esophagitis: Secondary | ICD-10-CM

## 2018-03-16 DIAGNOSIS — M436 Torticollis: Secondary | ICD-10-CM | POA: Diagnosis not present

## 2018-03-16 DIAGNOSIS — H1013 Acute atopic conjunctivitis, bilateral: Secondary | ICD-10-CM | POA: Diagnosis not present

## 2018-03-16 DIAGNOSIS — F0281 Dementia in other diseases classified elsewhere with behavioral disturbance: Secondary | ICD-10-CM

## 2018-03-16 DIAGNOSIS — J309 Allergic rhinitis, unspecified: Secondary | ICD-10-CM | POA: Diagnosis not present

## 2018-03-16 NOTE — Progress Notes (Signed)
Location:  Occupational psychologist of Service:  SNF (31) Provider:  Cindi Carbon, ANP [place] [phone]   [name], DO  Patient Care Team: [name], DO as PCP - General (Geriatric Medicine) [name], MD as Consulting Physician (Neurology) [name], MD as Consulting Physician (Urology) [name], MD as Consulting Physician (General Surgery) [name], MD as Consulting Physician (Orthopedic Surgery) [name], MD as Consulting Physician ([facility]) [name], MD as Consulting Physician (Gastroenterology) [name], DO as Consulting Physician (Geriatric Medicine)  Extended Emergency Contact Information Primary Emergency Contact: Kinyon,Peggy Address: [address]          [place], [place] [address] [name] of [place] Phone: [phone] Mobile Phone: [phone] Relation: Spouse  Code Status:  DNR Goals of care: Advanced Directive information Advanced Directives 01/16/2018  Does Patient Have a Medical Advance Directive? Yes  Type of Paramedic of [place];Living will;Out of facility DNR (pink MOST or yellow form)  Does patient want to make changes to medical advance directive? No - Patient declined  Copy of [place] in Chart? Yes  Would patient like information on creating a medical advance directive? -  Pre-existing out of facility DNR order (yellow form or pink MOST form) Yellow form placed in chart (order not valid for inpatient use);Pink MOST form placed in chart (order not valid for inpatient use)     Chief Complaint  Patient presents with  . Medical Management of Chronic Issues    HPI:  Pt is a 77 y.o. male seen today for medical management of chronic diseases.   MS: he has shown signs of progressive weakness to the right side, as well as all over including his neck. He is in a supportive chair and wears a  supportive neck brace. His care taker applies a heat pack to his neck at times to help with pain and discomfort  GERD: In Dec of 2018 he had some vomiting and was placed on prilosec. No further episodes reported.   Allergic rhinitis: no issues with watery eyes or nose.   Memory loss: progressive decline in memory. Currently on namenda. We had discontinued it as I received a message from the staff that his wife wanted to decrease his pill burden but she decided she wanted to leave the medication on board. He continues to have periods of agitation but overall they seem to be better over the past few months per the care taker.  01/04/18: MMSE 12/30 with failed clock  Past Medical History:  Diagnosis Date  . Adenomatous polyp   . Allergy   . BPH (benign prostatic hyperplasia)   . COPD (chronic obstructive pulmonary disease) ([place])   . Dementia 2010  . Elevated PSA 07/31/2014  . Foot drop, right 2008  . 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 Generalized weakness 01/12/2013  . GERD (gastroesophageal reflux disease)   . Hyperlipidemia 07/31/2014  . Multiple sclerosis ([place]) 2008  . Prostatitis, chronic    Past Surgical History:  Procedure Laterality Date  . CATARACT EXTRACTION Left 02/06/12  . CATARACT EXTRACTION Right 2013  . ESOPHAGOGASTRODUODENOSCOPY ENDOSCOPY  04/16/2002   Dr. Earle Gell  . HERNIA REPAIR  1980'2000   3 inguinal hernia repairs on left  . HERNIA REPAIR  06/16/11   right inguinal hernia repair with mesh   . PROSTATE SURGERY  09/29/2008   Dr. Karsten Ro     Allergies  Allergen  Reactions  . Aricept [Donepezil Hcl] Other (See Comments)    Reaction:  Unknown   . Dimethyl Fumarate Nausea And Vomiting    Outpatient Encounter Medications as of 03/16/2018  Medication Sig  . cetirizine (ZYRTEC) 10 MG tablet Take 10 mg by mouth at bedtime.  . ciclopirox (LOPROX) 0.77 % cream Apply 1 application topically 2 (two) times daily. Pt applies to face.  . divalproex (DEPAKOTE SPRINKLE) 125 MG  capsule Take 125 mg by mouth 2 (two) times daily.   Marland Kitchen docusate (COLACE) 50 MG/5ML liquid Take 100 mg by mouth daily.  Marland Kitchen gabapentin (NEURONTIN) 100 MG capsule Take 100 mg by mouth 3 (three) times daily.  Marland Kitchen ketoconazole (NIZORAL) 2 % shampoo Apply 1 application topically 2 (two) times a week. Pt uses on Tuesday and Friday.  . magnesium hydroxide (MILK OF MAGNESIA) 400 MG/5ML suspension Take 15 mLs by mouth daily as needed for mild constipation or moderate constipation.   . Melatonin 10 MG TABS Take 1 tablet by mouth at bedtime.  . naproxen sodium (ANAPROX) 220 MG tablet Take 220 mg by mouth every 8 (eight) hours as needed (for discomfort).   . Olopatadine HCl (PATADAY) 0.2 % SOLN Apply 1 drop to eye daily.  Marland Kitchen omeprazole (PRILOSEC) 20 MG capsule Take 20 mg daily by mouth.  . ondansetron (ZOFRAN) 4 MG tablet Take 4 mg every 8 (eight) hours as needed by mouth for nausea or vomiting.  . polyethylene glycol (MIRALAX / GLYCOLAX) packet Take 17 g by mouth every other day.   . sertraline (ZOLOFT) 100 MG tablet Take 100 mg by mouth daily.  . solifenacin (VESICARE) 10 MG tablet Take 10 mg by mouth daily.   No facility-administered encounter medications on file as of 03/16/2018.     Review of Systems  Constitutional: Negative for activity change, appetite change, chills, diaphoresis, fatigue, fever and unexpected weight change.  Respiratory: Negative for cough, shortness of breath, wheezing and stridor.   Cardiovascular: Negative for chest pain, palpitations and leg swelling.  Gastrointestinal: Negative for abdominal distention, abdominal pain, constipation and diarrhea.  Genitourinary: Negative for difficulty urinating and dysuria.  Musculoskeletal: Positive for arthralgias, gait problem, neck pain and neck stiffness. Negative for back pain, joint swelling and myalgias.  Skin: Negative for rash.  Neurological: Positive for weakness. Negative for dizziness, tremors, seizures, syncope, facial asymmetry,  speech difficulty, numbness and headaches.  Hematological: Negative for adenopathy. Does not bruise/bleed easily.  Psychiatric/Behavioral: Positive for agitation and confusion. Negative for behavioral problems.    Immunization History  Administered Date(s) Administered  . Influenza Inj Mdck Quad Pf 07/07/2016  . Influenza-Unspecified 07/02/2015, 07/13/2017  . Pneumococcal Conjugate-13 02/10/2015  . Pneumococcal Polysaccharide-23 10/23/2006  . Tdap 04/19/2005, 10/25/2016   Pertinent  Health Maintenance Due  Topic Date Due  . INFLUENZA VACCINE  04/19/2018  . PNA vac Low Risk Adult  Completed   Fall Risk  01/10/2018 01/04/2017 07/04/2016 02/17/2016 08/23/2015  Falls in the past year? No No Yes No Yes  Number falls in past yr: - - 2 or more - 1  Injury with Fall? - - Yes - Yes  Comment - - - - -  Risk Factor Category  - - High Fall Risk - High Fall Risk  Risk for fall due to : - - History of fall(s);Impaired balance/gait - History of fall(s);Impaired balance/gait;Impaired mobility;Medication side effect;Mental status change  Follow up - - Falls evaluation completed - Falls evaluation completed;Education provided;Falls prevention discussed   Functional Status Survey:  Vitals:   03/16/18 1053  Weight: 203 lb (92.1 kg)   Body mass index is 26.78 kg/m. Physical Exam  Constitutional: No distress.  HENT:  Head: Normocephalic and atraumatic.  Nose: Nose normal.  Mouth/Throat: Oropharynx is clear and moist. No oropharyngeal exudate.  Eyes: Pupils are equal, round, and reactive to light. Conjunctivae are normal. Right eye exhibits no discharge. Left eye exhibits no discharge.  Cardiovascular: Normal rate and regular rhythm.  No murmur heard. Pulmonary/Chest: Effort normal and breath sounds normal.  Abdominal: Soft. Bowel sounds are normal. He exhibits no distension.  Musculoskeletal: He exhibits no edema or tenderness.  Reduce ROM to the neck with neck positioning left leaning. Right  sided weakness present.   Neurological: He is alert.  Oriented to self. Able to f/c.  Skin: Skin is warm and dry. He is not diaphoretic.  Psychiatric: He has a normal mood and affect.    Labs reviewed: Recent Labs    08/22/17  NA 141  K 4.5  BUN 15  CREATININE 0.6   No results for input(s): AST, ALT, ALKPHOS, BILITOT, PROT, ALBUMIN in the last 8760 hours. Recent Labs    08/22/17  WBC 9.1  HGB 14.7  HCT 44  PLT 176   Lab Results  Component Value Date   TSH 2.56 08/22/2017   Lab Results  Component Value Date   HGBA1C 5.7 (H) 01/13/2013   Lab Results  Component Value Date   CHOL 193 08/17/2016   HDL 46 08/17/2016   LDLCALC 133 08/17/2016   TRIG 86 08/17/2016   CHOLHDL 3.9 05/13/2014    Significant Diagnostic Results in last 30 days:  No results found.  Assessment/Plan  1. Multiple sclerosis (HCC) Slow and progressive decline in function Supportive care only  2. Dementia associated with other underlying disease with behavioral disturbance Continue Namenda 10 mg BID  Continue Depakote 125 mg BID  3. Gastroesophageal reflux disease without esophagitis Helped improve episodes of vomiting and decreased appetite Continue Prilosec 20 mg qd  4. Allergic conjunctivitis of both eyes and rhinitis Improved symptoms Continue Zyrtec 10 mg qhs  5. Contracture of neck Continue supportive chair, brace, and hot pack for relief   Family/ staff Communication: staff/caretaker/resident  Labs/tests ordered:  Depakote level, CBC, BMP

## 2018-03-19 DIAGNOSIS — D649 Anemia, unspecified: Secondary | ICD-10-CM | POA: Diagnosis not present

## 2018-03-19 DIAGNOSIS — R569 Unspecified convulsions: Secondary | ICD-10-CM | POA: Diagnosis not present

## 2018-03-19 DIAGNOSIS — D509 Iron deficiency anemia, unspecified: Secondary | ICD-10-CM | POA: Diagnosis not present

## 2018-03-19 LAB — CBC AND DIFFERENTIAL
HCT: 44 (ref 41–53)
Hemoglobin: 14.9 (ref 13.5–17.5)
Platelets: 172 (ref 150–399)
WBC: 7.1

## 2018-03-19 LAB — BASIC METABOLIC PANEL
BUN: 17 (ref 4–21)
CREATININE: 0.7 (ref 0.6–1.3)
GLUCOSE: 88
POTASSIUM: 4.3 (ref 3.4–5.3)
Sodium: 139 (ref 137–147)

## 2018-05-10 ENCOUNTER — Encounter: Payer: Self-pay | Admitting: Adult Health

## 2018-05-10 ENCOUNTER — Non-Acute Institutional Stay (SKILLED_NURSING_FACILITY): Payer: Medicare Other | Admitting: Adult Health

## 2018-05-10 DIAGNOSIS — Z9359 Other cystostomy status: Secondary | ICD-10-CM

## 2018-05-10 DIAGNOSIS — L219 Seborrheic dermatitis, unspecified: Secondary | ICD-10-CM | POA: Diagnosis not present

## 2018-05-10 DIAGNOSIS — N319 Neuromuscular dysfunction of bladder, unspecified: Secondary | ICD-10-CM | POA: Diagnosis not present

## 2018-05-10 DIAGNOSIS — K5904 Chronic idiopathic constipation: Secondary | ICD-10-CM | POA: Diagnosis not present

## 2018-05-10 DIAGNOSIS — G35 Multiple sclerosis: Secondary | ICD-10-CM | POA: Diagnosis not present

## 2018-05-10 DIAGNOSIS — H1013 Acute atopic conjunctivitis, bilateral: Secondary | ICD-10-CM | POA: Diagnosis not present

## 2018-05-10 DIAGNOSIS — R269 Unspecified abnormalities of gait and mobility: Secondary | ICD-10-CM | POA: Diagnosis not present

## 2018-05-10 DIAGNOSIS — K219 Gastro-esophageal reflux disease without esophagitis: Secondary | ICD-10-CM

## 2018-05-10 DIAGNOSIS — F0281 Dementia in other diseases classified elsewhere with behavioral disturbance: Secondary | ICD-10-CM | POA: Diagnosis not present

## 2018-05-10 DIAGNOSIS — I5189 Other ill-defined heart diseases: Secondary | ICD-10-CM

## 2018-05-10 DIAGNOSIS — J309 Allergic rhinitis, unspecified: Secondary | ICD-10-CM

## 2018-05-10 DIAGNOSIS — F02818 Dementia in other diseases classified elsewhere, unspecified severity, with other behavioral disturbance: Secondary | ICD-10-CM

## 2018-05-10 NOTE — Progress Notes (Signed)
Provider:   Cindi Carbon, ANP [place] [phone]  Location: [facility] of Service:  SNF (31)   PCP: [name], DO Patient Care Team: [name], DO as PCP - General (Geriatric Medicine) [name], MD as Consulting Physician (Neurology) [name], MD as Consulting Physician (Urology) [name], MD as Consulting Physician (General Surgery) [name], MD as Consulting Physician (Orthopedic Surgery) [name], MD as Consulting Physician ([facility]) [name], MD as Consulting Physician (Gastroenterology) [name], DO as Consulting Physician (Geriatric Medicine)  Extended Emergency Contact Information Primary Emergency Contact: Murdaugh,Peggy Address: [address]          [place], [place] [address] [name] of [place] Phone: [phone] Mobile Phone: [phone] Relation: Spouse  Code Status: DNR Goals of Care: Advanced Directive information Advanced Directives 05/15/2018  Does Patient Have a Medical Advance Directive? Yes  Type of Paramedic of [place];Living will;Out of facility DNR (pink MOST or yellow form)  Does patient want to make changes to medical advance directive? No - Patient declined  Copy of [place] in Chart? Yes  Would patient like information on creating a medical advance directive? -  Pre-existing out of facility DNR order (yellow form or pink MOST form) Yellow form placed in chart (order not valid for inpatient use);Pink MOST form placed in chart (order not valid for inpatient use)     Chief Complaint  Patient presents with  . Annual Exam    HPI: Patient is a 77 y.o. male seen today for an annual comprehensive examination. He resides in skilled care due to [place] with functional decline.  He is update on vaccinations and not appropriate for screening testing. His goals of care are comfort based  except he would like treatment for reversible conditions such as infection.  MS: right sided weakness, not on meds  Dementia associated with MS:  Has periods of agitation and but is easily redirected when he has one on one caregivers. Currently on depakote. Dep level 16 03/19/18.   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 Adenomatous polyp   . Allergy   . BPH (benign prostatic hyperplasia)   . COPD (chronic obstructive pulmonary disease) ([place])   . Dementia 2010  . Elevated PSA 07/31/2014  . Foot drop, right 2008  . 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 Generalized weakness 01/12/2013  . GERD (gastroesophageal reflux disease)   . Hyperlipidemia 07/31/2014  . Multiple sclerosis ([place]) 2008  . Prostatitis, chronic    Past Surgical History:  Procedure Laterality Date  . CATARACT EXTRACTION Left 02/06/12  . CATARACT EXTRACTION Right 2013  . ESOPHAGOGASTRODUODENOSCOPY ENDOSCOPY  04/16/2002   Dr. Earle Gell  . HERNIA REPAIR  1980'2000   3 inguinal hernia repairs on left  . HERNIA REPAIR  06/16/11   right inguinal hernia repair with mesh   . PROSTATE SURGERY  09/29/2008   Dr. Karsten Ro  reports that he quit smoking about 42 years ago. He has a 10.00 pack-year smoking history. He has never used smokeless tobacco. He reports that he drinks alcohol. He reports that he does not use drugs. Social History   Socioeconomic History  . Marital status: Married    Spouse name: Vickii Chafe  . Number of children: 0  .  Years of education: [employer]  . Highest education level: Not on file  Occupational History  . Occupation: Retired Retail buyer  . Financial resource strain: Not hard at all  . Food insecurity:    Worry: Never true    Inability: Never true  . Transportation needs:    Medical: No    Non-medical: No  Tobacco Use  . Smoking status: Former Smoker    Packs/day: 0.50    Years: 20.00    Pack years: 10.00    Last attempt to quit: 09/20/1975    Years since quitting: 42.6  . Smokeless tobacco: Never Used  Substance and Sexual Activity  . Alcohol use: Yes    Comment: a social drink once a week  . Drug use: No  . Sexual activity: Never  Lifestyle  . Physical activity:    Days per week: 0 days    Minutes per session: 0 min  . Stress: Not at all  Relationships  . Social connections:    Talks on phone: Three times a week    Gets together: Three times a week    Attends religious service: Never    Active member of club or organization: No    Attends meetings of clubs or organizations: Never    Relationship status: Married  Other Topics Concern  . Not on file  Social History Narrative   Pt lives at [facility] and moved in 07/29/2014.   Spouse Peggy    Caffeine: Quit in 2003   Stopped smoking 1977   Exercise none   POA, Living Will   Family History  Problem Relation Age of Onset  . Depression Mother   . Dementia Father   . Pneumonia Father     Pertinent  Health Maintenance Due  Topic Date Due  . INFLUENZA VACCINE  04/19/2018  . PNA vac Low Risk Adult  Completed   Fall Risk  01/10/2018 01/04/2017 07/04/2016 02/17/2016 08/23/2015  Falls in the past year? No No Yes No Yes  Number falls in past yr: - - 2 or more - 1  Injury with Fall? - - Yes - Yes  Comment - - - - -  Risk Factor Category  - - High Fall Risk - High Fall Risk  Risk for fall due to : - - History of fall(s);Impaired balance/gait - History of fall(s);Impaired balance/gait;Impaired mobility;Medication side  effect;Mental status change  Follow up - - Falls evaluation completed - Falls evaluation completed;Education provided;Falls prevention discussed   Depression screen [facility] 2/9 01/10/2018 01/04/2017 07/04/2016 02/17/2016 08/23/2015  Decreased Interest 0 0 0 0 0  Down, Depressed, Hopeless 0 0 0 0 0  PHQ - 2 Score 0 0 0 0 0  Altered sleeping - - - - -  Tired, decreased energy - - - - -  Change in appetite - - - - -  Feeling bad or failure about yourself  - - - - -  Trouble concentrating - - - - -  Moving slowly or fidgety/restless - - - - -  Suicidal thoughts - - - - -  PHQ-9 Score - - - - -  Functional Status Survey:    Allergies  Allergen Reactions  . Aricept [Donepezil Hcl] Other (See Comments)    Reaction:  Unknown   . Dimethyl Fumarate Nausea And Vomiting    Outpatient Encounter Medications as of 05/10/2018  Medication Sig  . cetirizine (ZYRTEC) 10 MG tablet Take 10 mg by mouth at bedtime.  . ciclopirox (LOPROX) 0.77 % cream Apply 1 application topically 2 (two) times daily. Pt applies to face.  . divalproex (DEPAKOTE SPRINKLE) 125 MG capsule Take 125 mg by mouth 2 (two) times daily.   Marland Kitchen docusate (COLACE) 50 MG/5ML liquid Take 100 mg by mouth daily.  Marland Kitchen gabapentin (NEURONTIN) 100 MG capsule Take 100 mg by mouth 3 (three) times daily.  Marland Kitchen ketoconazole (NIZORAL) 2 % shampoo Apply 1 application topically 2 (two) times a week. Pt uses on Tuesday and Friday.  . magnesium hydroxide (MILK OF MAGNESIA) 400 MG/5ML suspension Take 15 mLs by mouth daily as needed for mild constipation or moderate constipation.   . Melatonin 10 MG TABS Take 1 tablet by mouth at bedtime.  . naproxen sodium (ANAPROX) 220 MG tablet Take 220 mg by mouth every 8 (eight) hours as needed (for discomfort).   . Olopatadine HCl (PATADAY) 0.2 % SOLN Apply 1 drop to eye daily.  Marland Kitchen omeprazole (PRILOSEC) 20 MG capsule Take 20 mg daily by mouth.  . polyethylene glycol (MIRALAX / GLYCOLAX) packet Take 17 g by mouth every other  day.   . sertraline (ZOLOFT) 50 MG tablet Take 50 mg by mouth daily.  . solifenacin (VESICARE) 10 MG tablet Take 10 mg by mouth daily.  . [DISCONTINUED] ondansetron (ZOFRAN) 4 MG tablet Take 4 mg every 8 (eight) hours as needed by mouth for nausea or vomiting.  . [DISCONTINUED] sertraline (ZOLOFT) 100 MG tablet Take 100 mg by mouth daily.   No facility-administered encounter medications on file as of 05/10/2018.     Review of Systems  Constitutional: Positive for unexpected weight change. Negative for activity change, appetite change, chills, diaphoresis, fatigue and fever.  Respiratory: Negative for cough, shortness of breath, wheezing and stridor.   Cardiovascular: Negative for chest pain, palpitations and leg swelling.  Gastrointestinal: Negative for abdominal distention, abdominal pain, constipation and diarrhea.  Genitourinary: Positive for dysuria (chronic). Negative for difficulty urinating, flank pain, frequency, hematuria, penile swelling and scrotal swelling.  Musculoskeletal: Positive for gait problem. Negative for arthralgias, back pain, joint swelling and myalgias.  Skin: Negative for wound.  Neurological: Positive for weakness. Negative for dizziness, seizures, syncope, facial asymmetry, speech difficulty and headaches.  Hematological: Negative for adenopathy. Does not bruise/bleed easily.  Psychiatric/Behavioral: Positive for agitation and confusion. Negative for behavioral problems.    Vitals:   05/10/18 1604  Weight: 213 lb 14.4 oz (97 kg)   Body mass index is 28.22 kg/m. Physical Exam  Constitutional: No distress.  HENT:  Head: Normocephalic and atraumatic.  Left Ear: External ear normal.  Nose: Nose normal.  Mouth/Throat: Oropharynx is clear and moist. No oropharyngeal exudate.  Eyes: Pupils are equal, round, and reactive to light. Conjunctivae and EOM are normal. Right eye exhibits no discharge. Left eye exhibits no discharge.  Neck: No JVD present. No tracheal  deviation present. No thyromegaly present.  Cardiovascular: Normal rate and regular rhythm.  No murmur heard. Pulmonary/Chest: Effort normal and breath sounds normal. No respiratory distress. He has no wheezes.  Abdominal: Soft. Bowel sounds are normal. He exhibits no distension. There is no tenderness.  Lymphadenopathy:    He has  no cervical adenopathy.  Neurological: He is alert. No cranial nerve deficit.  Right sided weakness. Oriented to self and place.  Skin: Skin is warm and dry. He is not diaphoretic.  Dry, erythematous patches to nasolabial folds  Psychiatric: He has a normal mood and affect.  Nursing note and vitals reviewed.   Labs reviewed: Basic Metabolic Panel: Recent Labs    08/22/17 03/19/18  NA 141 139  K 4.5 4.3  BUN 15 17  CREATININE 0.6 0.7   Liver Function Tests: No results for input(s): AST, ALT, ALKPHOS, BILITOT, PROT, ALBUMIN in the last 8760 hours. No results for input(s): LIPASE, AMYLASE in the last 8760 hours. No results for input(s): AMMONIA in the last 8760 hours. CBC: Recent Labs    08/22/17 03/19/18  WBC 9.1 7.1  HGB 14.7 14.9  HCT 44 44  PLT 176 172   Cardiac Enzymes: No results for input(s): CKTOTAL, CKMB, CKMBINDEX, TROPONINI in the last 8760 hours. BNP: Invalid input(s): POCBNP Lab Results  Component Value Date   HGBA1C 5.7 (H) 01/13/2013   Lab Results  Component Value Date   TSH 2.56 08/22/2017   Lab Results  Component Value Date   JJOACZYS06 301 01/21/2013   Lab Results  Component Value Date   FOLATE 16.9 01/21/2013   No results found for: IRON, TIBC, FERRITIN  Imaging and Procedures obtained recently: No results found.  Assessment/Plan  Dementia with behavioral disturbance Progressive decline in cognition and function. Behaviors seem fairly well controlled with the Depakote with occasional outbursts. Dose reduction to 50 mg of Zoloft has not proved to increase symptoms.  Multiple sclerosis Significant debility and  right sided weakness associated with MS.   GE reflux Continue Prilosec 20 mg qd  Seborrheic dermatitis Controlled with ciclopirox and Nizoral  Constipation Continue miralax 17 grams qod.   Diastolic dysfunction Grade 2 on echo in 2015. Has not had symptoms. Weight gain is likely due to over eating. Continue to monitor.   Suprapubic catheter ([place]) [facility] by [facility]. Continue to monitor .   Neurogenic bladder Continue neurontin 100 mg TID.   Allergic conjunctivitis and rhinitis Controlled with zyrtec and pataday  Gait disorder Associated with MS. Uses a hoyer lift for transfers.     Family/ staff Communication: staff/resident  Labs/tests ordered: NA

## 2018-05-15 NOTE — Assessment & Plan Note (Signed)
Controlled with zyrtec and pataday

## 2018-05-15 NOTE — Assessment & Plan Note (Signed)
Grade 2 on echo in 2015. Has not had symptoms. Weight gain is likely due to over eating. Continue to monitor.

## 2018-05-15 NOTE — Assessment & Plan Note (Addendum)
Progressive decline in cognition and function. Behaviors seem fairly well controlled with the Depakote with occasional outbursts. Dose reduction to 50 mg of Zoloft has not proved to increase symptoms.

## 2018-05-15 NOTE — Assessment & Plan Note (Signed)
Maintenance by [facility]. Continue to monitor .

## 2018-05-15 NOTE — Assessment & Plan Note (Signed)
Significant debility and right sided weakness associated with MS.

## 2018-05-15 NOTE — Assessment & Plan Note (Signed)
Continue miralax 17 grams qod. 

## 2018-05-15 NOTE — Assessment & Plan Note (Signed)
Associated with MS. Uses a hoyer lift for transfers.

## 2018-05-15 NOTE — Assessment & Plan Note (Signed)
Controlled with ciclopirox and Nizoral

## 2018-05-15 NOTE — Assessment & Plan Note (Signed)
Continue neurontin 100 mg TID.

## 2018-05-15 NOTE — Assessment & Plan Note (Signed)
Continue Prilosec 20 mg qd

## 2018-06-07 ENCOUNTER — Non-Acute Institutional Stay (SKILLED_NURSING_FACILITY): Payer: Medicare Other | Admitting: Adult Health

## 2018-06-07 DIAGNOSIS — N319 Neuromuscular dysfunction of bladder, unspecified: Secondary | ICD-10-CM | POA: Diagnosis not present

## 2018-06-07 DIAGNOSIS — G35 Multiple sclerosis: Secondary | ICD-10-CM

## 2018-06-07 DIAGNOSIS — K5904 Chronic idiopathic constipation: Secondary | ICD-10-CM | POA: Diagnosis not present

## 2018-06-07 DIAGNOSIS — R269 Unspecified abnormalities of gait and mobility: Secondary | ICD-10-CM | POA: Diagnosis not present

## 2018-06-07 DIAGNOSIS — F5101 Primary insomnia: Secondary | ICD-10-CM

## 2018-06-07 DIAGNOSIS — R635 Abnormal weight gain: Secondary | ICD-10-CM

## 2018-06-08 ENCOUNTER — Encounter: Payer: Self-pay | Admitting: Adult Health

## 2018-06-08 DIAGNOSIS — R635 Abnormal weight gain: Secondary | ICD-10-CM | POA: Insufficient documentation

## 2018-06-08 NOTE — Progress Notes (Signed)
Location:  [facility] of Service:  SNF (31) Provider:   Cindi Carbon, ANP [place] [phone]   [name], DO  Patient Care Team: [name], DO as PCP - General (Geriatric Medicine) [name], MD as Consulting Physician (Neurology) [name], MD as Consulting Physician (Urology) [name], MD as Consulting Physician (General Surgery) [name], MD as Consulting Physician (Orthopedic Surgery) [name], MD as Consulting Physician ([facility]) [name], MD as Consulting Physician (Gastroenterology) [name], DO as Consulting Physician (Geriatric Medicine)  Extended Emergency Contact Information Primary Emergency Contact: [name] Address: [address]          [place],  [address] [name] of [place] Phone: [phone] Mobile Phone: [phone] Relation: Spouse  Code Status:  DNR Goals of care: Advanced Directive information Advanced Directives 05/15/2018  Does Patient Have a Medical Advance Directive? Yes  Type of Paramedic of [place];Living will;Out of facility DNR (pink MOST or yellow form)  Does patient want to make changes to medical advance directive? No - Patient declined  Copy of [place] in Chart? Yes  Would patient like information on creating a medical advance directive? -  Pre-existing out of facility DNR order (yellow form or pink MOST form) Yellow form placed in chart (order not valid for inpatient use);Pink MOST form placed in chart (order not valid for inpatient use)     Chief Complaint  Patient presents with  . Medical Management of Chronic Issues    HPI:  Pt is a 77 y.o. male seen today for medical management of chronic diseases.   He has a hx of MS with right sided weakness. 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  He is no longer on medication for this  issue due to lack of benefit. The nurses report that he is gaining weight and eating large amts of food despite "small portions" that are ordered. He is not having any sob or increased edema. There are no other complaints for the visit.  His neurogenic bladder pain is well controlled with neurontin. He has dementia  associated with MS and takes depakote which has helped to control agitation. He also has caregivers during the day to provide emotional support.  Past Medical History:  Diagnosis Date  . Adenomatous polyp   . Allergy   . BPH (benign prostatic hyperplasia)   . COPD (chronic obstructive pulmonary disease) ([place])   . Dementia 2010  . Elevated PSA 07/31/2014  . Foot drop, right 2008  . 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 Generalized weakness 01/12/2013  . GERD (gastroesophageal reflux disease)   . Hyperlipidemia 07/31/2014  . Multiple sclerosis ([place]) 2008  . Prostatitis, chronic    Past Surgical History:  Procedure Laterality Date  . CATARACT EXTRACTION Left 02/06/12  . CATARACT EXTRACTION Right 2013  . ESOPHAGOGASTRODUODENOSCOPY ENDOSCOPY  04/16/2002   Dr. Earle Gell  . HERNIA REPAIR  1980'2000   3 inguinal hernia repairs on left  . HERNIA REPAIR  06/16/11   right inguinal hernia repair with mesh   . PROSTATE SURGERY  09/29/2008   Dr. Karsten Ro     Allergies  Allergen Reactions  . Aricept [Donepezil Hcl] Other (See Comments)    Reaction:  Unknown   . Dimethyl Fumarate Nausea And Vomiting    Outpatient Encounter Medications as of 06/07/2018  Medication Sig  . cetirizine (ZYRTEC) 10  MG tablet Take 10 mg by mouth at bedtime.  . ciclopirox (LOPROX) 0.77 % cream Apply 1 application topically 2 (two) times daily. Pt applies to face.  . divalproex (DEPAKOTE SPRINKLE) 125 MG capsule Take 125 mg by mouth 2 (two) times daily.   Marland Kitchen docusate (COLACE) 50 MG/5ML liquid Take 100 mg by mouth daily.  Marland Kitchen gabapentin (NEURONTIN) 100 MG capsule Take 100 mg by mouth 3 (three) times daily.  Marland Kitchen  ketoconazole (NIZORAL) 2 % shampoo Apply 1 application topically 2 (two) times a week. Pt uses on Tuesday and Friday.  . magnesium hydroxide (MILK OF MAGNESIA) 400 MG/5ML suspension Take 15 mLs by mouth daily as needed for mild constipation or moderate constipation.   . Melatonin 10 MG TABS Take 1 tablet by mouth at bedtime.  . naproxen sodium (ANAPROX) 220 MG tablet Take 220 mg by mouth every 8 (eight) hours as needed (for discomfort).   . Olopatadine HCl (PATADAY) 0.2 % SOLN Apply 1 drop to eye daily.  Marland Kitchen omeprazole (PRILOSEC) 20 MG capsule Take 20 mg daily by mouth.  . polyethylene glycol (MIRALAX / GLYCOLAX) packet Take 17 g by mouth every other day.   . sertraline (ZOLOFT) 50 MG tablet Take 50 mg by mouth daily.  . solifenacin (VESICARE) 10 MG tablet Take 10 mg by mouth daily.   No facility-administered encounter medications on file as of 06/07/2018.     Review of Systems  Constitutional: Positive for unexpected weight change. Negative for activity change, appetite change, chills, diaphoresis, fatigue and fever.  Respiratory: Negative for cough, shortness of breath, wheezing and stridor.   Cardiovascular: Negative for chest pain, palpitations and leg swelling.  Gastrointestinal: Negative for abdominal distention, abdominal pain, constipation and diarrhea.  Genitourinary: Negative for difficulty urinating, dysuria and hematuria.  Musculoskeletal: Positive for gait problem. Negative for arthralgias, back pain, joint swelling and myalgias.  Neurological: Negative for dizziness, seizures, syncope, facial asymmetry, speech difficulty, weakness and headaches.  Hematological: Negative for adenopathy. Does not bruise/bleed easily.  Psychiatric/Behavioral: Positive for confusion. Negative for agitation and behavioral problems.  All other systems reviewed and are negative.   Immunization History  Administered Date(s) Administered  . Influenza Inj Mdck Quad Pf 07/07/2016  . Influenza-Unspecified  07/02/2015, 07/13/2017  . Pneumococcal Conjugate-13 02/10/2015  . Pneumococcal Polysaccharide-23 10/23/2006  . Tdap 04/19/2005, 10/25/2016   Pertinent  Health Maintenance Due  Topic Date Due  . INFLUENZA VACCINE  04/19/2018  . PNA vac Low Risk Adult  Completed   Fall Risk  01/10/2018 01/04/2017 07/04/2016 02/17/2016 08/23/2015  Falls in the past year? No No Yes No Yes  Number falls in past yr: - - 2 or more - 1  Injury with Fall? - - Yes - Yes  Comment - - - - -  Risk Factor Category  - - High Fall Risk - High Fall Risk  Risk for fall due to : - - History of fall(s);Impaired balance/gait - History of fall(s);Impaired balance/gait;Impaired mobility;Medication side effect;Mental status change  Follow up - - Falls evaluation completed - Falls evaluation completed;Education provided;Falls prevention discussed   Functional Status Survey:    Vitals:   06/08/18 1646  Weight: 213 lb 3.2 oz (96.7 kg)   Body mass index is 28.13 kg/m. Physical Exam  Constitutional: No distress.  HENT:  Head: Normocephalic and atraumatic.  Neck: Normal range of motion. Neck supple. No JVD present. No tracheal deviation present. No thyromegaly present.  Cardiovascular: Normal rate and regular rhythm.  No murmur heard. Pulmonary/Chest:  Effort normal and breath sounds normal. No respiratory distress. He has no wheezes.  Abdominal: Soft. Bowel sounds are normal. He exhibits no distension. There is no tenderness.  S/p cath with clear yellow urine, site with no surrounding drainage or redness  Musculoskeletal: He exhibits edema (mild to right arm and leg due to immobility). He exhibits no tenderness or deformity.  Lymphadenopathy:    He has no cervical adenopathy.  Neurological: He is alert.  Oriented x 2. Right sided weakness noted to RUE and RLE  Skin: Skin is warm and dry. He is not diaphoretic.  Psychiatric: He has a normal mood and affect.  Nursing note and vitals reviewed.   Labs reviewed: Recent  Labs    08/22/17 03/19/18  NA 141 139  K 4.5 4.3  BUN 15 17  CREATININE 0.6 0.7   No results for input(s): AST, ALT, ALKPHOS, BILITOT, PROT, ALBUMIN in the last 8760 hours. Recent Labs    08/22/17 03/19/18  WBC 9.1 7.1  HGB 14.7 14.9  HCT 44 44  PLT 176 172   Lab Results  Component Value Date   TSH 2.56 08/22/2017   Lab Results  Component Value Date   HGBA1C 5.7 (H) 01/13/2013   Lab Results  Component Value Date   CHOL 193 08/17/2016   HDL 46 08/17/2016   LDLCALC 133 08/17/2016   TRIG 86 08/17/2016   CHOLHDL 3.9 05/13/2014    Significant Diagnostic Results in last 30 days:  No results found.  Assessment and Plan  1. Weight gain Due to increased intake. Small portions ordered. Possibly he is experiencing s/e from the neurontin but I would not discontinue it as it has helped significantly with his bladder pain.   2. Neurogenic bladder Continue neurontin 100 mg TID  3. Primary insomnia Not currently on medication directed at sleep but likely sleeps well as he is on depakote and neurontin  4. Multiple sclerosis ([place]) Progressive right sided weakness  Comfort care  Not currently on therapy due to lack of benefit  5. Gait disorder Harrel Lemon lift for transfers now due to #4  6. Chronic idiopathic constipation Controlled with miralax and senokot.     Family/ staff Communication: discussed with the nurse  Labs/tests ordered:  NA

## 2018-06-26 ENCOUNTER — Non-Acute Institutional Stay (SKILLED_NURSING_FACILITY): Payer: Medicare Other | Admitting: Internal Medicine

## 2018-06-26 ENCOUNTER — Encounter: Payer: Self-pay | Admitting: Internal Medicine

## 2018-06-26 DIAGNOSIS — G35D Multiple sclerosis, unspecified: Secondary | ICD-10-CM

## 2018-06-26 DIAGNOSIS — Z9359 Other cystostomy status: Secondary | ICD-10-CM | POA: Diagnosis not present

## 2018-06-26 DIAGNOSIS — F02818 Dementia in other diseases classified elsewhere, unspecified severity, with other behavioral disturbance: Secondary | ICD-10-CM

## 2018-06-26 DIAGNOSIS — G35 Multiple sclerosis: Secondary | ICD-10-CM

## 2018-06-26 DIAGNOSIS — F0281 Dementia in other diseases classified elsewhere with behavioral disturbance: Secondary | ICD-10-CM | POA: Diagnosis not present

## 2018-06-26 NOTE — Progress Notes (Signed)
Location:   [facility]  Place of Service:   SNF Provider:  Alp Goldwater L. Mariea Clonts, D.O., C.M.D.  [name], DO  Patient Care Team: [name], DO as PCP - General (Geriatric Medicine) [name], MD as Consulting Physician (Neurology) [name], MD as Consulting Physician (Urology) [name], MD as Consulting Physician (General Surgery) [name], MD as Consulting Physician (Orthopedic Surgery) [name], MD as Consulting Physician ([facility]) [name], MD as Consulting Physician (Gastroenterology) [name], DO as Consulting Physician (Geriatric Medicine)  Extended Emergency Contact Information Primary Emergency Contact: Bourassa,Peggy Address: [address]          [place], [place] [address] [name] of [place] Phone: [phone] Mobile Phone: [phone] Relation: Spouse  Code Status:  DNR Goals of care: Advanced Directive information Advanced Directives 05/15/2018  Does Patient Have a Medical Advance Directive? Yes  Type of Paramedic of [place];Living will;Out of facility DNR (pink MOST or yellow form)  Does patient want to make changes to medical advance directive? No - Patient declined  Copy of [place] in Chart? Yes  Would patient like information on creating a medical advance directive? -  Pre-existing out of facility DNR order (yellow form or pink MOST form) Yellow form placed in chart (order not valid for inpatient use);Pink MOST form placed in chart (order not valid for inpatient use)     Chief Complaint  Patient presents with  . Acute Visit    behavioral concerns, pt yelling obscenities in the hallway    HPI:  Pt is a 77 y.o. 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  There is question if he's got delirium from an infection or other pathology or simply a change in his dementia behaviors. When seen, he was also making sexually inappropriate comments, he was doing the "raspberries" spitting behavior that he's developed.  He was allowing his CNAs to get him changed and transferred without difficulty.  He also made a racially inappropriate comment to one of the CNAs at one point.  He does seem somewhat frustrated about being unable to transfer on his own and his level of dependence.  At one time, zoloft was added when he had similar behaviors.  His dose remains low.  He also takes depakote for mood stability, but he did not tolerate higher doses due to sedation.     Past Medical History:  Diagnosis Date  . Adenomatous polyp   . Allergy   . BPH (benign prostatic hyperplasia)   . COPD (chronic obstructive pulmonary disease) ([place])   . Dementia 2010  . Elevated PSA 07/31/2014  . Foot drop, right 2008  . 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 Generalized weakness 01/12/2013  . GERD (gastroesophageal reflux disease)   . Hyperlipidemia 07/31/2014  . Multiple sclerosis ([place]) 2008  . Prostatitis, chronic    Past Surgical History:  Procedure Laterality Date  . CATARACT EXTRACTION Left 02/06/12  . CATARACT EXTRACTION Right 2013  . ESOPHAGOGASTRODUODENOSCOPY ENDOSCOPY  04/16/2002   Dr. Earle Gell  . HERNIA REPAIR  1980'2000   3 inguinal hernia repairs on left  . HERNIA REPAIR  06/16/11   right inguinal hernia repair with mesh   . PROSTATE SURGERY  09/29/2008   Dr. Karsten Ro     Allergies  Allergen Reactions  . Aricept [Donepezil Hcl] Other (See Comments)    Reaction:  Unknown   . Dimethyl Fumarate Nausea And Vomiting    Outpatient Encounter Medications as of 06/26/2018  Medication Sig  . cetirizine (ZYRTEC) 10 MG tablet Take 10 mg by mouth at bedtime.  . ciclopirox (LOPROX) 0.77 % cream Apply 1 application topically 2 (two) times daily. Pt  applies to face.  . divalproex (DEPAKOTE SPRINKLE) 125 MG capsule Take 125 mg by mouth 2 (two) times daily.   Marland Kitchen docusate (COLACE) 50 MG/5ML liquid Take 100 mg by mouth daily.  Marland Kitchen gabapentin (NEURONTIN) 100 MG capsule Take 100 mg by mouth 3 (three) times daily.  Marland Kitchen ketoconazole (NIZORAL) 2 % shampoo Apply 1 application topically 2 (two) times a week. Pt uses on Tuesday and Friday.  . magnesium hydroxide (MILK OF MAGNESIA) 400 MG/5ML suspension Take 15 mLs by mouth daily as needed for mild constipation or moderate constipation.   . Melatonin 10 MG TABS Take 1 tablet by mouth at bedtime.  . naproxen sodium (ANAPROX) 220 MG tablet Take 220 mg by mouth every 8 (eight) hours as needed (for discomfort).   . Olopatadine HCl (PATADAY) 0.2 % SOLN Apply 1 drop to eye daily.  Marland Kitchen omeprazole (PRILOSEC) 20 MG capsule Take 20 mg daily by mouth.  . polyethylene glycol (MIRALAX / GLYCOLAX) packet Take 17 g by mouth every other day.   . sertraline (ZOLOFT) 50 MG tablet Take 50 mg by mouth daily.  . solifenacin (VESICARE) 10 MG tablet Take 10 mg by mouth daily.   No facility-administered encounter medications on file as of 06/26/2018.     Review of Systems  Constitutional: Negative for chills, fever and malaise/fatigue.  HENT: Negative for congestion and hearing loss.   Eyes:       Glasses  Respiratory: Negative for cough and shortness of breath.   Cardiovascular: Negative for chest pain, palpitations and leg swelling.  Gastrointestinal: Negative for abdominal pain, blood in stool, constipation, diarrhea and melena.       Bowels moving  Genitourinary: Negative for dysuria.       Suprapubic catheter with dark yellow urine  Musculoskeletal: Negative for back pain, falls and joint pain.  Skin: Negative for itching and rash.  Neurological: Negative for dizziness and loss of consciousness.       Right foot drop, has head support also now and high backed wheelchair  Psychiatric/Behavioral: Positive for depression  and memory loss. The patient is not nervous/anxious and does not have insomnia.        Now with some sexual disinhibition and obscenities, raspberries/spitting behavior    Immunization History  Administered Date(s) Administered  . Influenza Inj Mdck Quad Pf 07/07/2016  . Influenza-Unspecified 07/02/2015, 07/13/2017  . Pneumococcal Conjugate-13 02/10/2015  . Pneumococcal Polysaccharide-23 10/23/2006  . Tdap 04/19/2005, 10/25/2016   Pertinent  Health Maintenance Due  Topic Date Due  . INFLUENZA VACCINE  04/19/2018  . PNA vac Low Risk Adult  Completed   Fall Risk  01/10/2018 01/04/2017 07/04/2016 02/17/2016 08/23/2015  Falls in the past year? No No Yes No Yes  Number falls in past yr: - - 2 or more - 1  Injury with Fall? - - Yes - Yes  Comment - - - - -  Risk Factor Category  - - High Fall Risk - High Fall Risk  Risk for fall due to : - - History of fall(s);Impaired balance/gait - History of fall(s);Impaired balance/gait;Impaired mobility;Medication side effect;Mental status change  Follow up - - Falls evaluation completed - Falls evaluation completed;Education provided;Falls prevention discussed   Functional Status Survey:    There were no vitals filed for this visit. There is no height or weight on file to calculate BMI. Physical Exam  Constitutional: No distress.  Cardiovascular: Normal rate, regular rhythm, normal heart sounds and intact distal pulses.  Pulmonary/Chest: Effort normal and breath sounds normal. No respiratory distress. He has no wheezes. He has no rales.  Abdominal: Bowel sounds are normal.  Musculoskeletal:  Right foot drop, difficulty keeping head  Up straight  Neurological: He is alert.  Oriented to self, no longer remembering individual staff like he once did  Skin: Skin is warm and dry.    Labs reviewed: Recent Labs    08/22/17 03/19/18  NA 141 139  K 4.5 4.3  BUN 15 17  CREATININE 0.6 0.7   No results for input(s): AST, ALT, ALKPHOS, BILITOT, PROT,  ALBUMIN in the last 8760 hours. Recent Labs    08/22/17 03/19/18  WBC 9.1 7.1  HGB 14.7 14.9  HCT 44 44  PLT 176 172   Lab Results  Component Value Date   TSH 2.56 08/22/2017   Lab Results  Component Value Date   HGBA1C 5.7 (H) 01/13/2013   Lab Results  Component Value Date   CHOL 193 08/17/2016   HDL 46 08/17/2016   LDLCALC 133 08/17/2016   TRIG 86 08/17/2016   CHOLHDL 3.9 05/13/2014     Assessment/Plan 1. Dementia associated with other underlying disease with behavioral disturbance ([place]) -progressing with more frontal lobe signs, due to his MS -increase zoloft to 100mg  due to depression causing behaviors as described above which are offensive to CNAs and nursing staff and could influence his care -there are no signs of infection, constipation, aspiration, etc to suggest that this is a delirium rather than a progression of dementia, but cbc, bmp done to ensure no leukocytosis, renal failure of electrolyte abnormality  2. Multiple sclerosis ([place]) -could have a new lesion in the frontal lobe? Vs simply MS related dementia progressing -he is wife has always been clear that she does not want [place] to pursue further evaluation and mgt of his MS due to his advanced stage and functional impairments and, especially, his cognitive losses  3. Suprapubic catheter ([place]) -cont this for neurogenic bladder from [place] -not having difficulty with bladder spasms since vesicare in place--anticholinergic but has been long term so unlikely to be cause of changes now (and he was having pain without it).   -he also has prn aleve, but this has not been necessary as often recently   Family/ staff Communication: discussed with SNF nurse  Labs/tests ordered:  Cbc with diff, bmp in am; no s/s of UTI so urine not done  Coleson Kant L. Lyndsey Demos, D.O. [facility] [address], [place] [address] Cell Phone (Mon-Fri 8am-5pm):  [phone] On Call:   [phone] & follow prompts after 5pm & weekends Office Phone:  [phone] Office Fax:  [phone]

## 2018-06-27 DIAGNOSIS — D649 Anemia, unspecified: Secondary | ICD-10-CM | POA: Diagnosis not present

## 2018-06-27 DIAGNOSIS — G9340 Encephalopathy, unspecified: Secondary | ICD-10-CM | POA: Diagnosis not present

## 2018-06-27 LAB — BASIC METABOLIC PANEL
BUN: 18 (ref 4–21)
CREATININE: 0.7 (ref 0.6–1.3)
GLUCOSE: 80
Potassium: 4.4 (ref 3.4–5.3)
SODIUM: 141 (ref 137–147)

## 2018-06-27 LAB — CBC AND DIFFERENTIAL
HCT: 43 (ref 41–53)
HEMOGLOBIN: 14.7 (ref 13.5–17.5)
Platelets: 182 (ref 150–399)
WBC: 6.5

## 2018-07-08 IMAGING — CT CT CERVICAL SPINE W/O CM
4 of 8 series · 11 of 33 positions shown, 12 images · non-contrast
Comparison: 05/14/2015

CLINICAL DATA: Fell at [HOSPITAL].  Dementia.

EXAM:
CT HEAD WITHOUT CONTRAST
CT CERVICAL SPINE WITHOUT CONTRAST
TECHNIQUE: Multidetector CT imaging of the head and cervical spine was
performed following the standard protocol without intravenous
contrast. Multiplanar CT image reconstructions of the cervical spine
were also generated.

[Series 5: coronal · coronal · 0.35mm/px · 1 of 74 slices shown]
[im 37/74  bone]
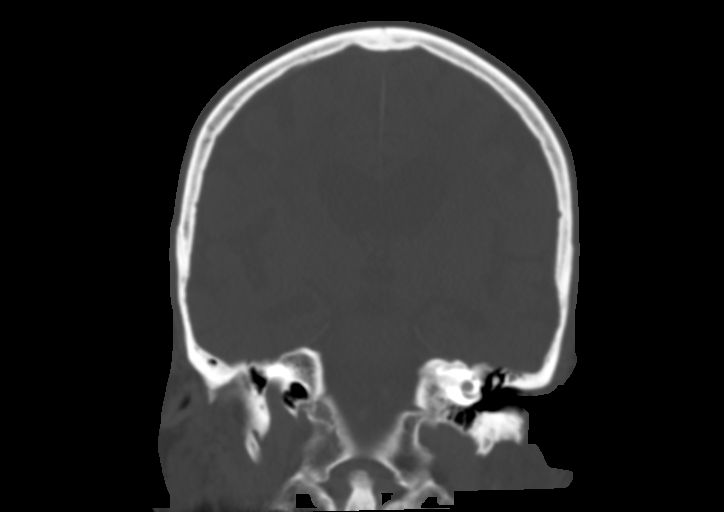

[Series 6: sagittal · sagittal · 0.36mm/px · 5 of 73 slices shown]
[im 13/73  bone]
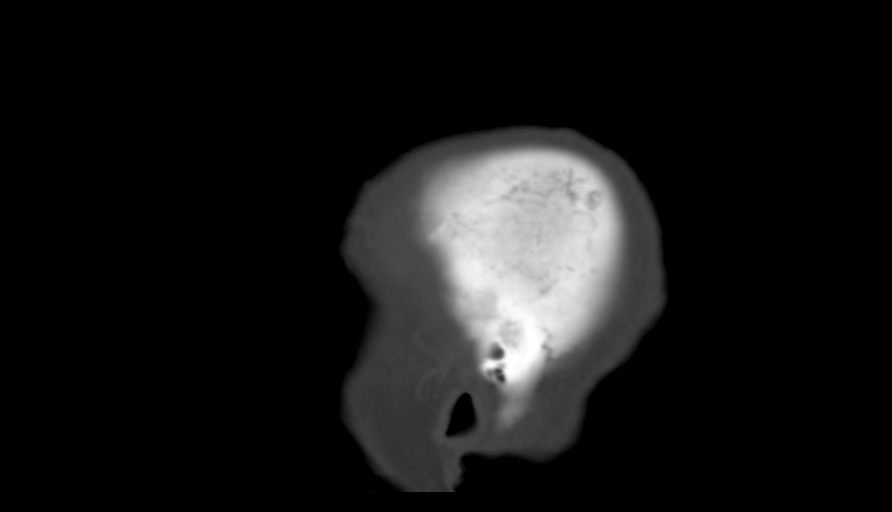
[im 25/73  bone]
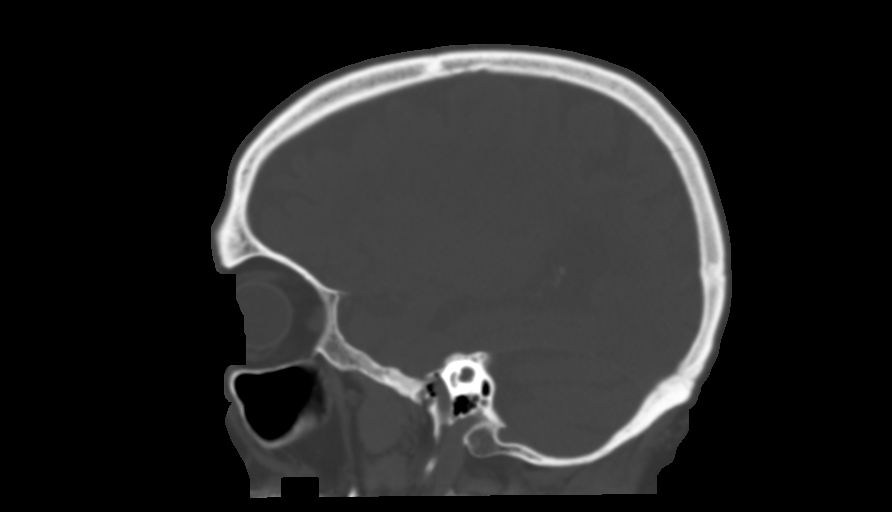
[im 37/73  bone]
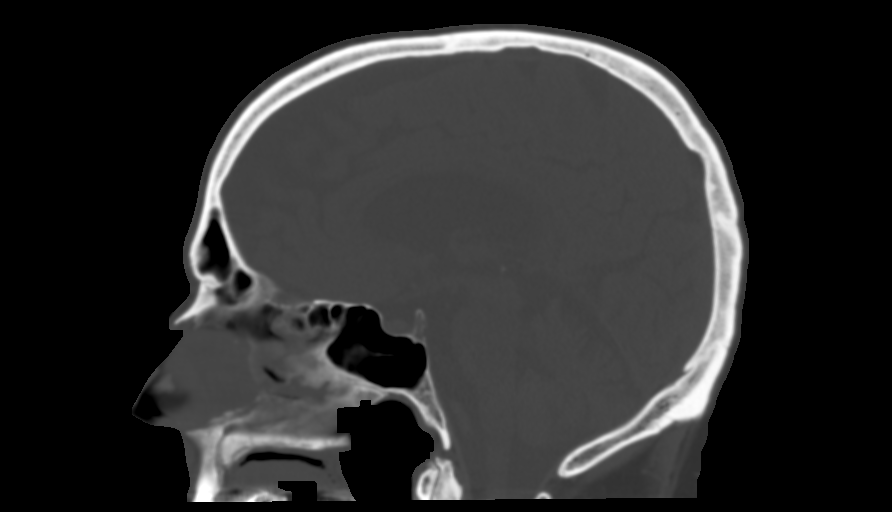
[im 49/73  bone]
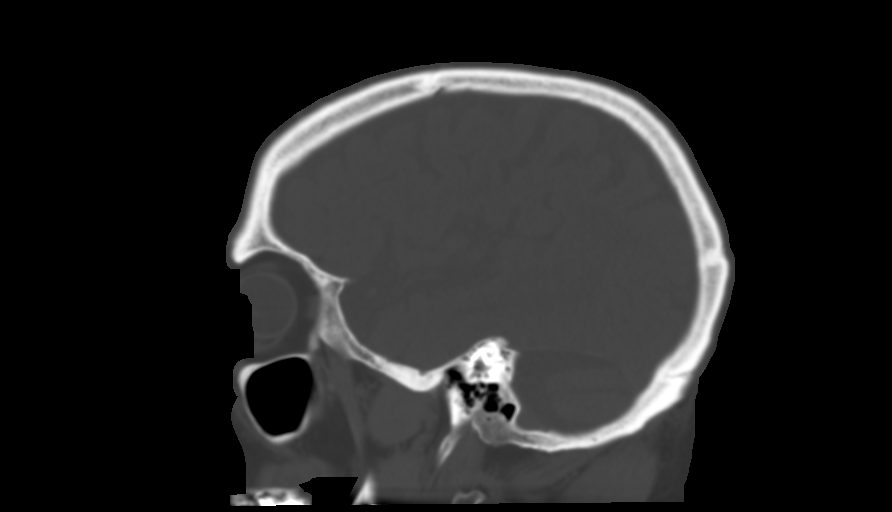
[im 61/73  bone]
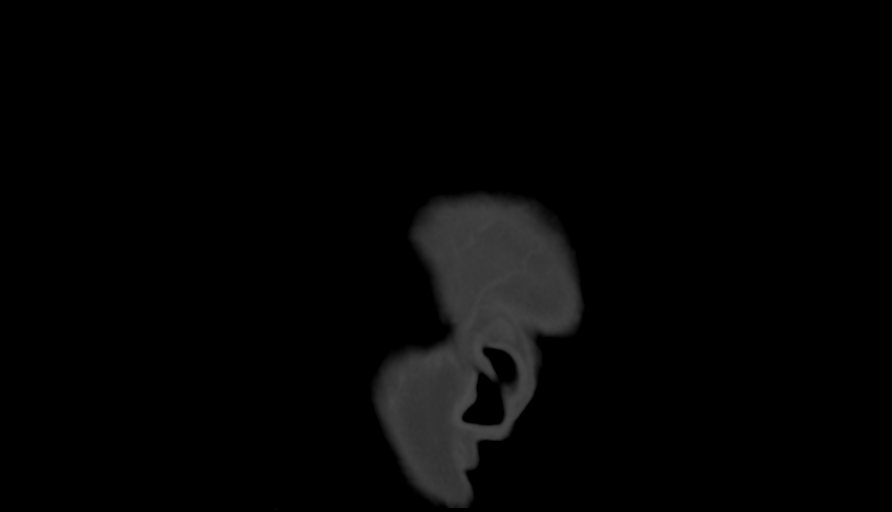

[Series 7: c-spine st · axial · 0.27mm/px · z∈[-218,-160]mm · 2 of 89 slices shown]
[im 30/89  bone]
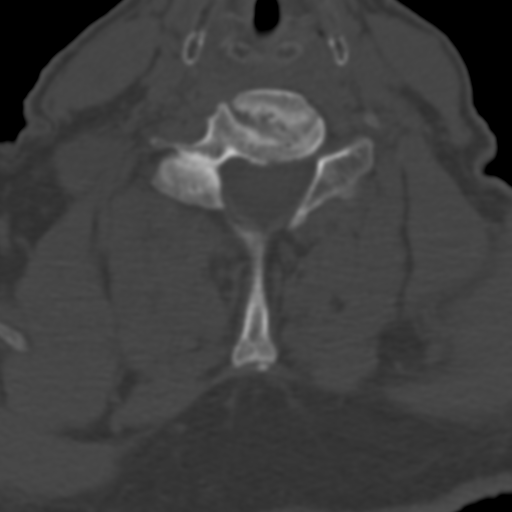
[im 59/89  bone]
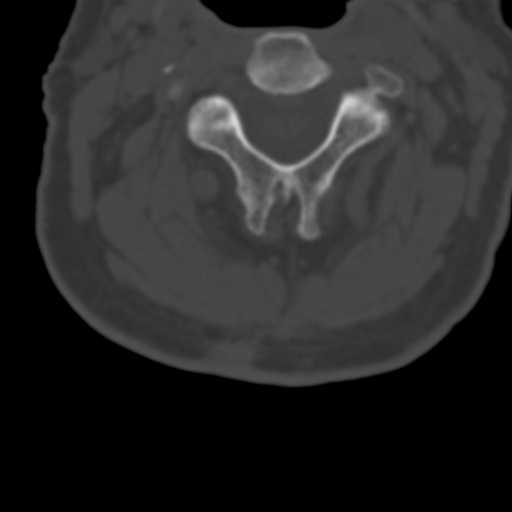

[Series 10: axial recon · axial · 0.23mm/px · z∈[-259,-156]mm · 3 of 110 slices shown, 4 images]
[im 28/110  soft-tissue]
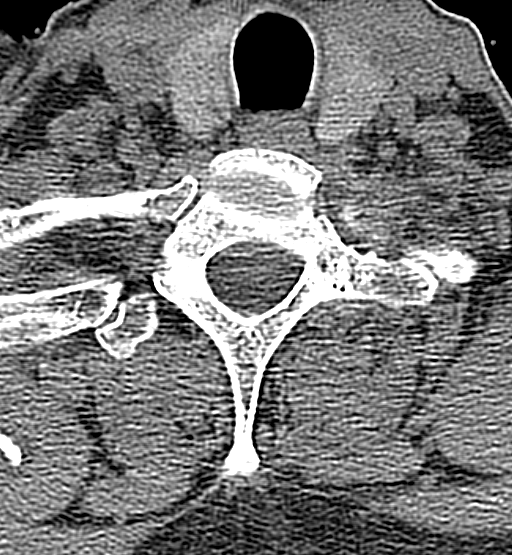
[im 28/110  bone]
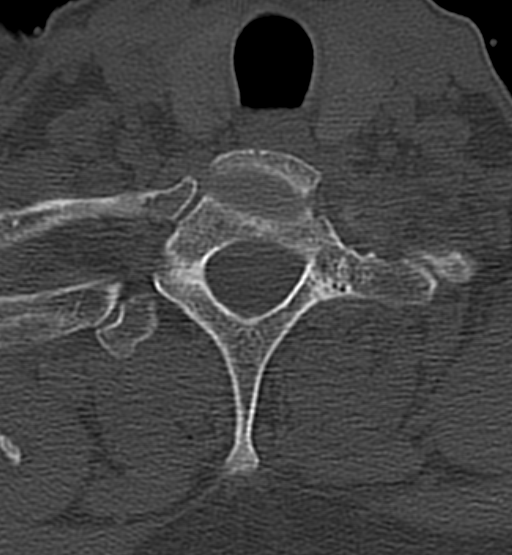
[im 55/110  bone]
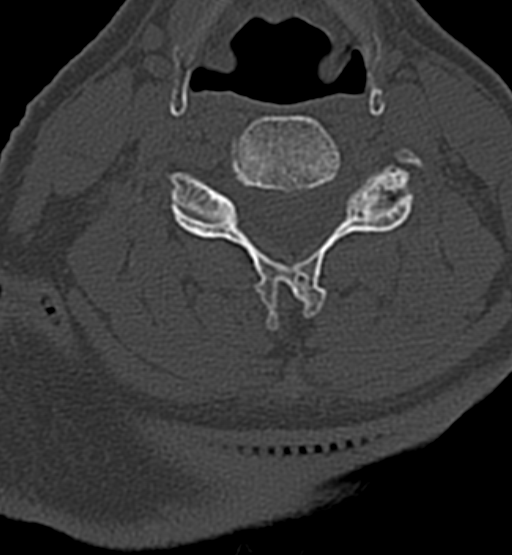
[im 82/110  bone]
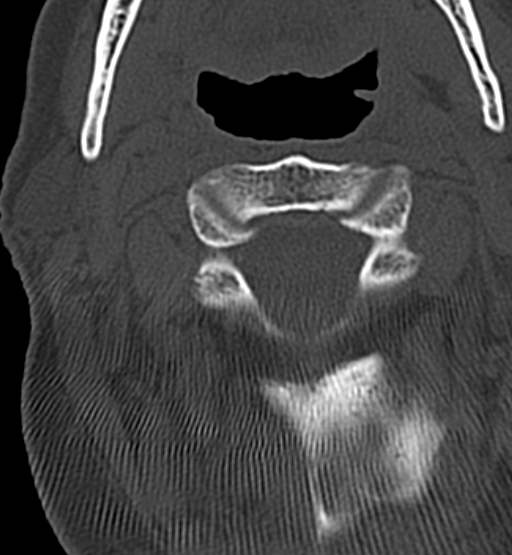

[11 of 33 positions shown; findings below may reference images not displayed]

FINDINGS: CT HEAD FINDINGS

The brain shows generalized atrophy. There are extensive chronic
small vessel ischemic changes affecting the pons, thalami, basal
ganglia and cerebral hemispheric white matter. No large vessel
territory infarction. No mass lesion, hemorrhage, hydrocephalus or
extra-axial collection. No skull fracture. No fluid in the sinuses,
middle ears or mastoids.

CT CERVICAL SPINE FINDINGS

No fracture or traumatic malalignment. Ordinary osteoarthritis of
the C1-2 articulation. Chronic degenerative spondylosis at C3-4,
C5-6 and C6-7. Facet osteoarthritis most pronounced on the right at
C3-4 and on the left at C7-T1.
IMPRESSION: Head CT: No acute or traumatic finding. Extensive chronic small
vessel ischemic changes throughout the brain.

CT cervical spine: No acute or traumatic finding. Chronic
degenerative changes.

## 2018-07-09 MED ORDER — SERTRALINE HCL 100 MG PO TABS: 50.0000 mg | ORAL_TABLET | Freq: Every day | ORAL | 5 refills | Status: DC

## 2018-07-09 NOTE — Addendum Note (Signed)
Addended by: [name] on: 07/09/2018 06:19 AM   Modules accepted: Level of Service

## 2018-07-19 DIAGNOSIS — Z23 Encounter for immunization: Secondary | ICD-10-CM | POA: Diagnosis not present

## 2018-07-23 ENCOUNTER — Non-Acute Institutional Stay (SKILLED_NURSING_FACILITY): Payer: Medicare Other | Admitting: Adult Health

## 2018-07-23 DIAGNOSIS — G35 Multiple sclerosis: Secondary | ICD-10-CM | POA: Diagnosis not present

## 2018-07-23 DIAGNOSIS — F02818 Dementia in other diseases classified elsewhere, unspecified severity, with other behavioral disturbance: Secondary | ICD-10-CM

## 2018-07-23 DIAGNOSIS — R1310 Dysphagia, unspecified: Secondary | ICD-10-CM | POA: Diagnosis not present

## 2018-07-23 DIAGNOSIS — R635 Abnormal weight gain: Secondary | ICD-10-CM | POA: Diagnosis not present

## 2018-07-23 DIAGNOSIS — N319 Neuromuscular dysfunction of bladder, unspecified: Secondary | ICD-10-CM | POA: Diagnosis not present

## 2018-07-23 DIAGNOSIS — F0281 Dementia in other diseases classified elsewhere with behavioral disturbance: Secondary | ICD-10-CM | POA: Diagnosis not present

## 2018-07-23 DIAGNOSIS — K5904 Chronic idiopathic constipation: Secondary | ICD-10-CM | POA: Diagnosis not present

## 2018-07-23 NOTE — Progress Notes (Signed)
Location:  Occupational psychologist of Service:  SNF (31) Provider:   Cindi Carbon, ANP [place] [phone]   [name], DO  Patient Care Team: [name], DO as PCP - General (Geriatric Medicine) [name], MD as Consulting Physician (Neurology) [name], MD as Consulting Physician (Urology) [name], MD as Consulting Physician (General Surgery) [name], MD as Consulting Physician (Orthopedic Surgery) [name], MD as Consulting Physician ([facility]) [name], MD as Consulting Physician (Gastroenterology) [name], DO as Consulting Physician (Geriatric Medicine)  Extended Emergency Contact Information Primary Emergency Contact: Carl,Peggy Address: [address]          [place], [place] [address] [name] of [place] Phone: [phone] Mobile Phone: [phone] Relation: Spouse  Code Status:  DNR Goals of care: Advanced Directive information Advanced Directives 06/26/2018  Does Patient Have a Medical Advance Directive? Yes  Type of Paramedic of [place];Out of facility DNR (pink MOST or yellow form)  Does patient want to make changes to medical advance directive? No - Patient declined  Copy of [place] in Chart? Yes  Would patient like information on creating a medical advance directive? -  Pre-existing out of facility DNR order (yellow form or pink MOST form) Yellow form placed in chart (order not valid for inpatient use);Pink MOST form placed in chart (order not valid for inpatient use)     Chief Complaint  Patient presents with  . Medical Management of Chronic Issues    HPI:  Pt is a 77 y.o. male seen today for medical management of chronic diseases.   He has a hx of MS with dementia and has had worsening in behaviors and personality changes. The staff report that he is cursing in the dining room and saying  inappropriate things to his care takers. He can become easily angered and also is sticking his tongue out at staff and acting immature. This is a departure from his baseline, as he is typically a gentleman. Depakote was increased to 250 mg in the am and 125 mg in the pm on 10/28 but no benefit thus far. MMSE 11/30 07/06/18  Neurogenic bladder w/suprapubic catheter:  No reports of pain or spasm   Weight gain: he continues to eat frequently and is immobile, has gained approx 18 lbs since May. TSH 2.56 Wt Readings from Last 3 Encounters:  07/26/18 216 lb 3.2 oz (98.1 kg)  06/26/18 210 lb (95.3 kg)  06/08/18 213 lb 3.2 oz (96.7 kg)   Dysphagia: tolerating  D2 ground NTL Functional status: hoyer lift and incontinent.   Past Medical History:  Diagnosis Date  . Adenomatous polyp   . Allergy   . BPH (benign prostatic hyperplasia)   . COPD (chronic obstructive pulmonary disease) ([place])   . Dementia ([place]) 2010  . Elevated PSA 07/31/2014  . Foot drop, right 2008  . 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 Generalized weakness 01/12/2013  . GERD (gastroesophageal reflux disease)   . Hyperlipidemia 07/31/2014  . Multiple sclerosis ([place]) 2008  . Prostatitis, chronic    Past Surgical History:  Procedure Laterality Date  . CATARACT EXTRACTION Left 02/06/12  . CATARACT EXTRACTION Right 2013  . ESOPHAGOGASTRODUODENOSCOPY ENDOSCOPY  04/16/2002   Dr. Earle Gell  . HERNIA REPAIR  1980'2000   3 inguinal hernia repairs on left  . HERNIA REPAIR  06/16/11   right inguinal hernia repair with mesh   .  PROSTATE SURGERY  09/29/2008   Dr. Karsten Ro     Allergies  Allergen Reactions  . Aricept [Donepezil Hcl] Other (See Comments)    Reaction:  Unknown   . Dimethyl Fumarate Nausea And Vomiting    Outpatient Encounter Medications as of 07/23/2018  Medication Sig  . cetirizine (ZYRTEC) 10 MG tablet Take 10 mg by mouth at bedtime.  . ciclopirox (LOPROX) 0.77 % cream Apply 1 application topically 2 (two) times daily.  Pt applies to face.  . divalproex (DEPAKOTE SPRINKLE) 125 MG capsule Take 250 mg by mouth 2 (two) times daily. 250 in the am and 125 mg in the pm  . docusate (COLACE) 50 MG/5ML liquid Take 100 mg by mouth daily.  Marland Kitchen gabapentin (NEURONTIN) 100 MG capsule Take 100 mg by mouth 3 (three) times daily.  Marland Kitchen ketoconazole (NIZORAL) 2 % shampoo Apply 1 application topically 2 (two) times a week. Pt uses on Tuesday and Friday.  . magnesium hydroxide (MILK OF MAGNESIA) 400 MG/5ML suspension Take 15 mLs by mouth daily as needed for mild constipation or moderate constipation.   . Melatonin 10 MG TABS Take 1 tablet by mouth at bedtime.  . memantine (NAMENDA) 10 MG tablet Take 10 mg by mouth 2 (two) times daily.  . naproxen sodium (ANAPROX) 220 MG tablet Take 220 mg by mouth every 8 (eight) hours as needed (for discomfort).   . Olopatadine HCl (PATADAY) 0.2 % SOLN Apply 1 drop to eye daily.  Marland Kitchen omeprazole (PRILOSEC) 20 MG capsule Take 20 mg daily by mouth.  . polyethylene glycol (MIRALAX / GLYCOLAX) packet Take 17 g by mouth every other day.   . sertraline (ZOLOFT) 100 MG tablet Take 0.5 tablets (50 mg total) by mouth daily. (Patient taking differently: Take 100 mg by mouth daily. )  . solifenacin (VESICARE) 10 MG tablet Take 10 mg by mouth daily.   No facility-administered encounter medications on file as of 07/23/2018.     Review of Systems  Constitutional: Negative for activity change, appetite change, chills, diaphoresis, fatigue, fever and unexpected weight change.  Respiratory: Negative for cough, shortness of breath, wheezing and stridor.   Cardiovascular: Positive for leg swelling. Negative for chest pain and palpitations.  Gastrointestinal: Negative for abdominal distention, abdominal pain, constipation and diarrhea.  Genitourinary: Negative for difficulty urinating and dysuria.  Musculoskeletal: Positive for gait problem. Negative for arthralgias, back pain, joint swelling and myalgias.  Neurological:  Positive for weakness. Negative for dizziness, seizures, syncope, facial asymmetry, speech difficulty and headaches.  Hematological: Negative for adenopathy. Does not bruise/bleed easily.  Psychiatric/Behavioral: Positive for agitation, behavioral problems and confusion.    Immunization History  Administered Date(s) Administered  . Influenza Inj Mdck Quad Pf 07/07/2016  . Influenza,inj,Quad PF,6+ Mos 07/10/2018  . Influenza-Unspecified 07/02/2015, 07/13/2017  . Pneumococcal Conjugate-13 02/10/2015  . Pneumococcal Polysaccharide-23 10/23/2006  . Tdap 04/19/2005, 10/25/2016   Pertinent  Health Maintenance Due  Topic Date Due  . INFLUENZA VACCINE  Completed  . PNA vac Low Risk Adult  Completed   Fall Risk  01/10/2018 01/04/2017 07/04/2016 02/17/2016 08/23/2015  Falls in the past year? No No Yes No Yes  Number falls in past yr: - - 2 or more - 1  Injury with Fall? - - Yes - Yes  Comment - - - - -  Risk Factor Category  - - High Fall Risk - High Fall Risk  Risk for fall due to : - - History of fall(s);Impaired balance/gait - History of fall(s);Impaired balance/gait;Impaired mobility;Medication  side effect;Mental status change  Follow up - - Falls evaluation completed - Falls evaluation completed;Education provided;Falls prevention discussed   Functional Status Survey:    Vitals:   07/26/18 1234  Weight: 216 lb 3.2 oz (98.1 kg)   Body mass index is 28.52 kg/m. Physical Exam  Constitutional: No distress.  HENT:  Head: Normocephalic and atraumatic.  Nose: Nose normal.  Mouth/Throat: Oropharynx is clear and moist. No oropharyngeal exudate.  Neck: No JVD present. No tracheal deviation present. No thyromegaly present.  Cardiovascular: Normal rate and regular rhythm.  No murmur heard. Pulmonary/Chest: Effort normal and breath sounds normal. No respiratory distress. He has no wheezes.  Abdominal: Soft. Bowel sounds are normal. He exhibits no distension. There is no tenderness.    Lymphadenopathy:    He has no cervical adenopathy.  Neurological: He is alert.  Oriented to self only. Able to f/c.  Right sided weakness chronic  Skin: Skin is warm and dry. He is not diaphoretic.  Psychiatric: He has a normal mood and affect.  Nursing note and vitals reviewed.   Labs reviewed: Recent Labs    08/22/17 03/19/18 06/27/18  NA 141 139 141  K 4.5 4.3 4.4  BUN 15 17 18   CREATININE 0.6 0.7 0.7   No results for input(s): AST, ALT, ALKPHOS, BILITOT, PROT, ALBUMIN in the last 8760 hours. Recent Labs    08/22/17 03/19/18 06/27/18  WBC 9.1 7.1 6.5  HGB 14.7 14.9 14.7  HCT 44 44 43  PLT 176 172 182   Lab Results  Component Value Date   TSH 2.56 08/22/2017   Lab Results  Component Value Date   HGBA1C 5.7 (H) 01/13/2013   Lab Results  Component Value Date   CHOL 193 08/17/2016   HDL 46 08/17/2016   LDLCALC 133 08/17/2016   TRIG 86 08/17/2016   CHOLHDL 3.9 05/13/2014    Significant Diagnostic Results in last 30 days:  No results found.  Assessment/Plan  1. Dementia associated with other underlying disease with behavioral disturbance ([place]) We should give the depakote more time to work to help with his anger and agitation. Continue to monitor levels and response.   2. Weight gain Wife is reducing his menu items checked for meals to help with gain.  3. Multiple sclerosis ([facility]) Goals of care are comfort based, no longer on treatment  4. Chronic idiopathic constipation Controlled with colace and miralax  5. Neurogenic bladder Continue s/p care and neurontin 100 mg tid  6. Dysphagia, unspecified type Continue modified diet and asp prec   Family/ staff Communication: discussed with his wife Vickii Chafe in the hallway and his nurse Tammy  Labs/tests ordered:  NA

## 2018-07-26 ENCOUNTER — Encounter: Payer: Self-pay | Admitting: Adult Health

## 2018-07-26 DIAGNOSIS — R1310 Dysphagia, unspecified: Secondary | ICD-10-CM | POA: Insufficient documentation

## 2018-08-24 ENCOUNTER — Encounter: Payer: Self-pay | Admitting: Adult Health

## 2018-08-24 ENCOUNTER — Non-Acute Institutional Stay (SKILLED_NURSING_FACILITY): Payer: Medicare Other | Admitting: Adult Health

## 2018-08-24 DIAGNOSIS — R635 Abnormal weight gain: Secondary | ICD-10-CM | POA: Diagnosis not present

## 2018-08-24 DIAGNOSIS — G35 Multiple sclerosis: Secondary | ICD-10-CM

## 2018-08-24 DIAGNOSIS — N319 Neuromuscular dysfunction of bladder, unspecified: Secondary | ICD-10-CM

## 2018-08-24 DIAGNOSIS — K5904 Chronic idiopathic constipation: Secondary | ICD-10-CM

## 2018-08-24 DIAGNOSIS — F0281 Dementia in other diseases classified elsewhere with behavioral disturbance: Secondary | ICD-10-CM | POA: Diagnosis not present

## 2018-08-24 DIAGNOSIS — F02818 Dementia in other diseases classified elsewhere, unspecified severity, with other behavioral disturbance: Secondary | ICD-10-CM

## 2018-08-24 NOTE — Progress Notes (Signed)
Location:  Occupational psychologist of Service:  SNF (31) Provider:   Cindi Carbon, ANP [place] [phone]   [name], DO  Patient Care Team: [name], DO as PCP - General (Geriatric Medicine) [name], MD as Consulting Physician (Neurology) [name], MD as Consulting Physician (Urology) [name], MD as Consulting Physician (General Surgery) [name], MD as Consulting Physician (Orthopedic Surgery) [name], MD as Consulting Physician ([facility]) [name], MD as Consulting Physician (Gastroenterology) [name], DO as Consulting Physician (Geriatric Medicine)  Extended Emergency Contact Information Primary Emergency Contact: Porada,Peggy Address: [address]          [place], [place] [address] [name] of [place] Phone: [phone] Mobile Phone: [phone] Relation: Spouse  Code Status:  DNR Goals of care: Advanced Directive information Advanced Directives 06/26/2018  Does Patient Have a Medical Advance Directive? Yes  Type of Paramedic of [place];Out of facility DNR (pink MOST or yellow form)  Does patient want to make changes to medical advance directive? No - Patient declined  Copy of [place] in Chart? Yes  Would patient like information on creating a medical advance directive? -  Pre-existing out of facility DNR order (yellow form or pink MOST form) Yellow form placed in chart (order not valid for inpatient use);Pink MOST form placed in chart (order not valid for inpatient use)     Chief Complaint  Patient presents with  . Medical Management of Chronic Issues    HPI:  Pt is a 77 y.o. male seen today for medical management of chronic diseases.   He has a hx of MS with progressive right sided weakness. He requires a hoyer lift for transfers and assistance with all ADLs  Dementia: progressive decline in  MMSE scores and worsening anger outbursts reported.  He yells at others in the dining room and can be verbally abusive. No improvement has been noted with increased depakote dosage last month  He denies any bladder pain or frequency. No reported issue with s/p cath.  Caretaker reports regular BMs.   Past Medical History:  Diagnosis Date  . Adenomatous polyp   . Allergy   . BPH (benign prostatic hyperplasia)   . COPD (chronic obstructive pulmonary disease) ([place])   . Dementia ([place]) 2010  . Elevated PSA 07/31/2014  . Foot drop, right 2008  . 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 Generalized weakness 01/12/2013  . GERD (gastroesophageal reflux disease)   . Hyperlipidemia 07/31/2014  . Multiple sclerosis ([place]) 2008  . Prostatitis, chronic    Past Surgical History:  Procedure Laterality Date  . CATARACT EXTRACTION Left 02/06/12  . CATARACT EXTRACTION Right 2013  . ESOPHAGOGASTRODUODENOSCOPY ENDOSCOPY  04/16/2002   Dr. Earle Gell  . HERNIA REPAIR  1980'2000   3 inguinal hernia repairs on left  . HERNIA REPAIR  06/16/11   right inguinal hernia repair with mesh   . PROSTATE SURGERY  09/29/2008   Dr. Karsten Ro     Allergies  Allergen Reactions  . Aricept [Donepezil Hcl] Other (See Comments)    Reaction:  Unknown   . Dimethyl Fumarate Nausea And Vomiting    Outpatient Encounter Medications as of 08/24/2018  Medication Sig  . cetirizine (ZYRTEC) 10 MG tablet Take 10 mg by mouth at bedtime.  . ciclopirox (LOPROX) 0.77 % cream Apply 1 application topically 2 (two) times daily. Pt applies to face.  . divalproex (DEPAKOTE  SPRINKLE) 125 MG capsule Take 250 mg by mouth 2 (two) times daily. 250 in the am and 125 mg in the pm  . docusate (COLACE) 50 MG/5ML liquid Take 100 mg by mouth daily.  Marland Kitchen gabapentin (NEURONTIN) 100 MG capsule Take 100 mg by mouth 3 (three) times daily.  Marland Kitchen ketoconazole (NIZORAL) 2 % shampoo Apply 1 application topically 2 (two) times a week. Pt uses on Tuesday and Friday.  .  magnesium hydroxide (MILK OF MAGNESIA) 400 MG/5ML suspension Take 15 mLs by mouth daily as needed for mild constipation or moderate constipation.   . Melatonin 10 MG TABS Take 1 tablet by mouth at bedtime.  . memantine (NAMENDA) 10 MG tablet Take 10 mg by mouth 2 (two) times daily.  . naproxen sodium (ANAPROX) 220 MG tablet Take 220 mg by mouth every 8 (eight) hours as needed (for discomfort).   . Olopatadine HCl (PATADAY) 0.2 % SOLN Apply 1 drop to eye daily.  Marland Kitchen omeprazole (PRILOSEC) 20 MG capsule Take 20 mg daily by mouth.  . polyethylene glycol (MIRALAX / GLYCOLAX) packet Take 17 g by mouth every other day.   . sertraline (ZOLOFT) 100 MG tablet Take 0.5 tablets (50 mg total) by mouth daily. (Patient taking differently: Take 100 mg by mouth daily. )  . solifenacin (VESICARE) 10 MG tablet Take 10 mg by mouth daily.   No facility-administered encounter medications on file as of 08/24/2018.     Review of Systems  Constitutional: Positive for unexpected weight change. Negative for activity change, appetite change, chills, diaphoresis, fatigue and fever.  Respiratory: Negative for cough, shortness of breath, wheezing and stridor.   Cardiovascular: Positive for leg swelling. Negative for chest pain and palpitations.  Gastrointestinal: Negative for abdominal distention, abdominal pain, constipation and diarrhea.  Genitourinary: Negative for difficulty urinating and dysuria.  Musculoskeletal: Positive for gait problem. Negative for arthralgias, back pain, joint swelling and myalgias.  Skin: Negative for wound.  Neurological: Positive for weakness. Negative for dizziness, tremors, seizures, syncope, facial asymmetry, speech difficulty and headaches.  Hematological: Negative for adenopathy. Does not bruise/bleed easily.  Psychiatric/Behavioral: Positive for agitation, behavioral problems and confusion. Negative for dysphoric mood, hallucinations, sleep disturbance and suicidal ideas. The patient is not  nervous/anxious and is not hyperactive.     Immunization History  Administered Date(s) Administered  . Influenza Inj Mdck Quad Pf 07/07/2016  . Influenza,inj,Quad PF,6+ Mos 07/10/2018  . Influenza-Unspecified 07/02/2015, 07/13/2017  . Pneumococcal Conjugate-13 02/10/2015  . Pneumococcal Polysaccharide-23 10/23/2006  . Tdap 04/19/2005, 10/25/2016   Pertinent  Health Maintenance Due  Topic Date Due  . INFLUENZA VACCINE  Completed  . PNA vac Low Risk Adult  Completed   Fall Risk  01/10/2018 01/04/2017 07/04/2016 02/17/2016 08/23/2015  Falls in the past year? No No Yes No Yes  Number falls in past yr: - - 2 or more - 1  Injury with Fall? - - Yes - Yes  Comment - - - - -  Risk Factor Category  - - High Fall Risk - High Fall Risk  Risk for fall due to : - - History of fall(s);Impaired balance/gait - History of fall(s);Impaired balance/gait;Impaired mobility;Medication side effect;Mental status change  Follow up - - Falls evaluation completed - Falls evaluation completed;Education provided;Falls prevention discussed   Functional Status Survey:    Vitals:   08/24/18 1535  Weight: 219 lb 3.2 oz (99.4 kg)   Body mass index is 28.92 kg/m. Physical Exam  Constitutional: No distress.  HENT:  Head: Normocephalic  and atraumatic.  Neck: No JVD present. No tracheal deviation present. No thyromegaly present.  Cardiovascular: Normal rate and regular rhythm.  No murmur heard. Pulmonary/Chest: Effort normal and breath sounds normal. No respiratory distress. He has no wheezes.  Abdominal: Soft. Bowel sounds are normal. He exhibits no distension. There is no tenderness.  Musculoskeletal: He exhibits edema (right arm and leg). He exhibits no tenderness or deformity.  Decreased ROM of the neck with weakness and leaning to the right.   Lymphadenopathy:    He has no cervical adenopathy.  Neurological: He is alert.  Oriented to self and place but not time.  Right sided weakness noted   Skin: Skin  is warm and dry. He is not diaphoretic.  Psychiatric: He has a normal mood and affect.  Nursing note and vitals reviewed.   Labs reviewed: Recent Labs    03/19/18 06/27/18  NA 139 141  K 4.3 4.4  BUN 17 18  CREATININE 0.7 0.7   No results for input(s): AST, ALT, ALKPHOS, BILITOT, PROT, ALBUMIN in the last 8760 hours. Recent Labs    03/19/18 06/27/18  WBC 7.1 6.5  HGB 14.9 14.7  HCT 44 43  PLT 172 182   Lab Results  Component Value Date   TSH 2.56 08/22/2017   Lab Results  Component Value Date   HGBA1C 5.7 (H) 01/13/2013   Lab Results  Component Value Date   CHOL 193 08/17/2016   HDL 46 08/17/2016   LDLCALC 133 08/17/2016   TRIG 86 08/17/2016   CHOLHDL 3.9 05/13/2014    Significant Diagnostic Results in last 30 days:  No results found.  Assessment/Plan 1. Dementia associated with other underlying disease with behavioral disturbance (HCC) Increase Depakote to 250 mg BID Dep level and LFTs in 2 weeks  2. Multiple sclerosis ([place]) Progressive right sided weakness that requires assistance with all ADLs and a hoyer lift for transfers. No issues with pain at this time.   3. Neurogenic bladder Has a suprapuic catheter Continue neurontin 100 mg tid for pain  4. Weight gain Progressive weight gain due to immobility, intake, and possible med s/e Continue smaller portions, would not taper any meds at this time due to behavioral issues  5. Chronic idiopathic constipation Controlled, continue miralax 17 grams qod and colace 100 mg qd    Family/ staff Communication: discussed with resident and caretaker   Labs/tests ordered:  Dep level and LFTs in 2 weeks

## 2018-09-01 DIAGNOSIS — J811 Chronic pulmonary edema: Secondary | ICD-10-CM | POA: Diagnosis not present

## 2018-09-01 DIAGNOSIS — R05 Cough: Secondary | ICD-10-CM | POA: Diagnosis not present

## 2018-09-01 DIAGNOSIS — D509 Iron deficiency anemia, unspecified: Secondary | ICD-10-CM | POA: Diagnosis not present

## 2018-09-01 DIAGNOSIS — F329 Major depressive disorder, single episode, unspecified: Secondary | ICD-10-CM | POA: Diagnosis not present

## 2018-09-01 DIAGNOSIS — G4483 Primary cough headache: Secondary | ICD-10-CM | POA: Diagnosis not present

## 2018-09-01 DIAGNOSIS — F3289 Other specified depressive episodes: Secondary | ICD-10-CM | POA: Diagnosis not present

## 2018-09-03 ENCOUNTER — Encounter: Payer: Self-pay | Admitting: Adult Health

## 2018-09-03 ENCOUNTER — Non-Acute Institutional Stay (SKILLED_NURSING_FACILITY): Payer: Medicare Other | Admitting: Adult Health

## 2018-09-03 DIAGNOSIS — R1312 Dysphagia, oropharyngeal phase: Secondary | ICD-10-CM

## 2018-09-03 DIAGNOSIS — R0902 Hypoxemia: Secondary | ICD-10-CM | POA: Diagnosis not present

## 2018-09-03 DIAGNOSIS — J209 Acute bronchitis, unspecified: Secondary | ICD-10-CM | POA: Diagnosis not present

## 2018-09-03 NOTE — Progress Notes (Signed)
Location:  Occupational psychologist of Service:  SNF (31) Provider:   Cindi Carbon, ANP [place] [phone]  Jose Curry, DO  Patient Care Team: Jose Curry, DO as PCP - General (Geriatric Medicine) [name], MD as Consulting Physician (Neurology) [name], MD as Consulting Physician (Urology) [name], MD as Consulting Physician (General Surgery) [name], MD as Consulting Physician (Orthopedic Surgery) [name], MD as Consulting Physician ([facility]) [name], MD as Consulting Physician (Gastroenterology) Jose Curry, DO as Consulting Physician (Geriatric Medicine)  Extended Emergency Contact Information Primary Emergency Contact: Tinner,Peggy Address: [address]          [place], [place] [address] [name] of [place] Phone: [phone] Mobile Phone: [phone] Relation: Spouse  Code Status:  DNR Goals of care: Advanced Directive information Advanced Directives 06/26/2018  Does Patient Have a Medical Advance Directive? Yes  Type of Paramedic of [place];Out of facility DNR (pink MOST or yellow form)  Does patient want to make changes to medical advance directive? No - Patient declined  Copy of [place] in Chart? Yes  Would patient like information on creating a medical advance directive? -  Pre-existing out of facility DNR order (yellow form or pink MOST form) Yellow form placed in chart (order not valid for inpatient use);Pink MOST form placed in chart (order not valid for inpatient use)     Chief Complaint  Patient presents with  . Acute Visit    f/u cough and hypoxia    HPI:  Pt is a 77 y.o. male seen today for an acute visit for cough and hypoxia. Resident was lethargic with cough and congestion on 12/15.  CXR was ordered which showed mild CM, mild CHF, moderate peribronchial thickening in the left lower lobe  but no definitive pna. He was started on Doxycycline on 12/15.  This morning his sats were in the 80s but improved on oxygen at 2 liters. He continues with a congested cough but is more alert and cooperative. Temp 98.9. T max 100.8 on 12/15 He has a hx of MS and has become progressively weaker over time. Also progressively more agitated and required titration of depakote. His swallow has been declining and his diet has been downgraded to D2 with NTL. His care taker states that he ate breakfast this morning and seemed more alert when compared to the day prior.  She reports that he has difficulty chewing dry meats and needs to be monitored closely during meal times.   Past Medical History:  Diagnosis Date  . Adenomatous polyp   . Allergy   . BPH (benign prostatic hyperplasia)   . COPD (chronic obstructive pulmonary disease) ([place])   . Dementia ([place]) 2010  . Elevated PSA 07/31/2014  . Foot drop, right 2008  . 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 Generalized weakness 01/12/2013  . GERD (gastroesophageal reflux disease)   . Hyperlipidemia 07/31/2014  . Multiple sclerosis ([place]) 2008  . Prostatitis, chronic    Past Surgical History:  Procedure Laterality Date  . CATARACT EXTRACTION Left 02/06/12  . CATARACT EXTRACTION Right 2013  . ESOPHAGOGASTRODUODENOSCOPY ENDOSCOPY  04/16/2002   Dr. Earle Gell  . HERNIA REPAIR  1980'2000   3 inguinal hernia repairs on left  . HERNIA REPAIR  06/16/11   right inguinal hernia repair with mesh   . PROSTATE SURGERY  09/29/2008   Dr. Karsten Ro     Allergies  Allergen Reactions  . Aricept [Donepezil Hcl] Other (See Comments)    Reaction:  Unknown   . Dimethyl Fumarate Nausea And Vomiting    Outpatient Encounter Medications as of 09/03/2018  Medication Sig  . cetirizine (ZYRTEC) 10 MG tablet Take 10 mg by mouth at bedtime.  . ciclopirox (LOPROX) 0.77 % cream Apply 1 application topically 2 (two) times daily. Pt applies to face.  . divalproex (DEPAKOTE SPRINKLE)  125 MG capsule Take 250 mg by mouth 2 (two) times daily. 250 in the am and 125 mg in the pm  . docusate (COLACE) 50 MG/5ML liquid Take 100 mg by mouth daily.  Marland Kitchen gabapentin (NEURONTIN) 100 MG capsule Take 100 mg by mouth 3 (three) times daily.  Marland Kitchen ketoconazole (NIZORAL) 2 % shampoo Apply 1 application topically 2 (two) times a week. Pt uses on Tuesday and Friday.  . magnesium hydroxide (MILK OF MAGNESIA) 400 MG/5ML suspension Take 15 mLs by mouth daily as needed for mild constipation or moderate constipation.   . Melatonin 10 MG TABS Take 1 tablet by mouth at bedtime.  . memantine (NAMENDA) 10 MG tablet Take 10 mg by mouth 2 (two) times daily.  . naproxen sodium (ANAPROX) 220 MG tablet Take 220 mg by mouth every 8 (eight) hours as needed (for discomfort).   . Olopatadine HCl (PATADAY) 0.2 % SOLN Apply 1 drop to eye daily.  Marland Kitchen omeprazole (PRILOSEC) 20 MG capsule Take 20 mg daily by mouth.  . polyethylene glycol (MIRALAX / GLYCOLAX) packet Take 17 g by mouth every other day.   . sertraline (ZOLOFT) 100 MG tablet Take 0.5 tablets (50 mg total) by mouth daily. (Patient taking differently: Take 100 mg by mouth daily. )  . solifenacin (VESICARE) 10 MG tablet Take 10 mg by mouth daily.   No facility-administered encounter medications on file as of 09/03/2018.     Review of Systems  Unable to perform ROS: Dementia    Immunization History  Administered Date(s) Administered  . Influenza Inj Mdck Quad Pf 07/07/2016  . Influenza,inj,Quad PF,6+ Mos 07/10/2018  . Influenza-Unspecified 07/02/2015, 07/13/2017  . Pneumococcal Conjugate-13 02/10/2015  . Pneumococcal Polysaccharide-23 10/23/2006  . Tdap 04/19/2005, 10/25/2016   Pertinent  Health Maintenance Due  Topic Date Due  . INFLUENZA VACCINE  Completed  . PNA vac Low Risk Adult  Completed   Fall Risk  01/10/2018 01/04/2017 07/04/2016 02/17/2016 08/23/2015  Falls in the past year? No No Yes No Yes  Number falls in past yr: - - 2 or more - 1  Injury  with Fall? - - Yes - Yes  Comment - - - - -  Risk Factor Category  - - High Fall Risk - High Fall Risk  Risk for fall due to : - - History of fall(s);Impaired balance/gait - History of fall(s);Impaired balance/gait;Impaired mobility;Medication side effect;Mental status change  Follow up - - Falls evaluation completed - Falls evaluation completed;Education provided;Falls prevention discussed   Functional Status Survey:    Vitals:   09/03/18 1626  BP: 118/65  Pulse: 86  Resp: 16  Temp: 98.9 F (37.2 C)  SpO2: 97%   There is no height or weight on file to calculate BMI. Physical Exam Vitals signs and nursing note reviewed.  Constitutional:      General: He is not in acute distress.    Appearance: Normal appearance. He is not toxic-appearing.  HENT:     Head: Normocephalic and atraumatic.     Right Ear: Tympanic membrane, ear canal and  external ear normal. There is no impacted cerumen.     Left Ear: Tympanic membrane, ear canal and external ear normal. There is no impacted cerumen.     Nose: Nose normal. No congestion or rhinorrhea.     Mouth/Throat:     Mouth: Mucous membranes are moist.     Pharynx: Oropharynx is clear. No oropharyngeal exudate.  Eyes:     General:        Right eye: No discharge.        Left eye: No discharge.     Conjunctiva/sclera: Conjunctivae normal.     Pupils: Pupils are equal, round, and reactive to light.  Cardiovascular:     Rate and Rhythm: Normal rate and regular rhythm.     Heart sounds: No murmur.  Pulmonary:     Effort: Pulmonary effort is normal.     Breath sounds: Rhonchi present. No wheezing or rales.  Abdominal:     General: Abdomen is flat. Bowel sounds are normal. There is no distension.     Palpations: Abdomen is soft.  Lymphadenopathy:     Cervical: No cervical adenopathy.  Neurological:     Mental Status: He is alert.     Comments: Oriented to self and location. Right sided weakness chronic  Psychiatric:        Mood and  Affect: Mood normal.     Labs reviewed: Recent Labs    03/19/18 06/27/18  NA 139 141  K 4.3 4.4  BUN 17 18  CREATININE 0.7 0.7   No results for input(s): AST, ALT, ALKPHOS, BILITOT, PROT, ALBUMIN in the last 8760 hours. Recent Labs    03/19/18 06/27/18  WBC 7.1 6.5  HGB 14.9 14.7  HCT 44 43  PLT 172 182   Lab Results  Component Value Date   TSH 2.56 08/22/2017   Lab Results  Component Value Date   HGBA1C 5.7 (H) 01/13/2013   Lab Results  Component Value Date   CHOL 193 08/17/2016   HDL 46 08/17/2016   LDLCALC 133 08/17/2016   TRIG 86 08/17/2016   CHOLHDL 3.9 05/13/2014    Significant Diagnostic Results in last 30 days:  No results found.  Assessment/Plan 1. Acute bronchitis, unspecified organism Continue Doxycycline to complete 10 days course Continue duonebs tid to complete 7 day course  2. Hypoxia Oxygen 2 liters as needed for sat <90%  3. Oropharyngeal dysphagia Likely progressive and the cause for the acute infection  Continue D2 diet with NTL and add gravies with all meats.  1:1 supervision and asp prec The above findings were discussed with his wife Vickii Chafe. We agreed not to escalate care further.     Family/ staff Communication: Discussed with his wife Vickii Chafe Labs/tests ordered:  NA

## 2018-09-04 ENCOUNTER — Non-Acute Institutional Stay (SKILLED_NURSING_FACILITY): Payer: Medicare Other | Admitting: Internal Medicine

## 2018-09-04 ENCOUNTER — Encounter: Payer: Self-pay | Admitting: Internal Medicine

## 2018-09-04 DIAGNOSIS — J181 Lobar pneumonia, unspecified organism: Secondary | ICD-10-CM | POA: Diagnosis not present

## 2018-09-04 DIAGNOSIS — F0281 Dementia in other diseases classified elsewhere with behavioral disturbance: Secondary | ICD-10-CM | POA: Diagnosis not present

## 2018-09-04 DIAGNOSIS — F02818 Dementia in other diseases classified elsewhere, unspecified severity, with other behavioral disturbance: Secondary | ICD-10-CM

## 2018-09-04 DIAGNOSIS — R5081 Fever presenting with conditions classified elsewhere: Secondary | ICD-10-CM

## 2018-09-04 NOTE — Progress Notes (Signed)
Patient ID: [name], male   DOB: June 02, 1941, 77 y.o.   MRN: [id number]  Location:  [facility] Room Number: 129 Place of Service:  SNF (31) Provider:  Tinya Cadogan L. Mariea Clonts, D.O., C.M.D.  [name], DO  Patient Care Team: [name], DO as PCP - General (Geriatric Medicine) [name], MD as Consulting Physician (Neurology) [name], MD as Consulting Physician (Urology) [name], MD as Consulting Physician (General Surgery) [name], MD as Consulting Physician (Orthopedic Surgery) [name], MD as Consulting Physician ([facility]) [name], MD as Consulting Physician (Gastroenterology) [name], DO as Consulting Physician (Geriatric Medicine)  Extended Emergency Contact Information Primary Emergency Contact: Woodside,Peggy Address: [address]          [place], [place] [address] [name] of [place] Phone: [phone] Mobile Phone: [phone] Relation: Spouse  Code Status:  DNR, MOST Goals of care: Advanced Directive information Advanced Directives 09/04/2018  Does Patient Have a Medical Advance Directive? Yes  Type of Advance Directive Out of facility DNR (pink MOST or yellow form);[place];Living will  Does patient want to make changes to medical advance directive? No - Patient declined  Copy of [place] in Chart? Yes - validated most recent copy scanned in chart (See row information)  Would patient like information on creating a medical advance directive? -  Pre-existing out of facility DNR order (yellow form or pink MOST form) Yellow form placed in chart (order not valid for inpatient use);Pink MOST form placed in chart (order not valid for inpatient use)   Chief Complaint  Patient presents with  . Acute Visit    being treated for bronchitis, got worse today    HPI:  Pt is a 77 y.o. male with MS, dementia, constipation,  neurogenic bladder with retention and suprapubic catheter seen today for an acute visit for persistent fever despite abx for bronchitis, worsened congestion, lethargy and poor po intake today--did not eat his breakfast or lunch.   He had initially begun having respiratory symptoms back on 12/13 when I reviewed nursing notes--he had a dry scratchy throat, then later developed cough, flushing and his intake has not been as good.  He has not been laughing and making derogatory comments which has been his typical baseline.  Due to these changes, on 12/13 standing orders were initially given for robafen syrup and saline nasal spray. 12/14, LUL wheezing was noted.  sats were 94% and he was afebrile.  Due to the lung changes and appetite loss, flu swab was ordered by on call and returned negative.  CXR and labs were also ordered and later some duonebs.  The xray returned 12/15 showing left upper lobe peribronchial thickening.  He then had several temps over 100 on 12/15.   On call NP then prescribed doxycycline 100mg  po bid for 10 days for bronchitis.  He vomited with the first dose but has tolerated it since.  He also was given florastor.  He was doing a bit better as of 12/16 and got up and ate part of his meals, was more alert and sociable, but temps persisted in the 99 range and his sats were as low as 88%.  I was asked to check on him after he did not eat this morning and has been very lethargic, staying in the bed.  Temp was 100.2, RR 30, POX91 and normal HR and BP.  I spoke with his wife  who had been informed of his condition along with way and I told her I would change his abx and hoped he'd improve, but we were uncertain if he would.  She remained clear about her wishes based on his MOST form for comfort care.    Past Medical History:  Diagnosis Date  . Adenomatous polyp   . Allergy   . BPH (benign prostatic hyperplasia)   . COPD (chronic obstructive pulmonary disease) ([place])   . Dementia ([place]) 2010  .  Elevated PSA 07/31/2014  . Foot drop, right 2008  . 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 Generalized weakness 01/12/2013  . GERD (gastroesophageal reflux disease)   . Hyperlipidemia 07/31/2014  . Multiple sclerosis ([place]) 2008  . Prostatitis, chronic    Past Surgical History:  Procedure Laterality Date  . CATARACT EXTRACTION Left 02/06/12  . CATARACT EXTRACTION Right 2013  . ESOPHAGOGASTRODUODENOSCOPY ENDOSCOPY  04/16/2002   Dr. Earle Gell  . HERNIA REPAIR  1980'2000   3 inguinal hernia repairs on left  . HERNIA REPAIR  06/16/11   right inguinal hernia repair with mesh   . PROSTATE SURGERY  09/29/2008   Dr. Karsten Ro     Allergies  Allergen Reactions  . Aricept [Donepezil Hcl] Other (See Comments)    Reaction:  Unknown   . Dimethyl Fumarate Nausea And Vomiting    Outpatient Encounter Medications as of 09/04/2018  Medication Sig  . acetaminophen (TYLENOL) 650 MG suppository Place 650 mg rectally every 6 (six) hours as needed for fever.  . cetirizine (ZYRTEC) 10 MG tablet Take 10 mg by mouth at bedtime.  . ciclopirox (LOPROX) 0.77 % cream Apply 1 application topically 2 (two) times daily. Pt applies to face.  . divalproex (DEPAKOTE SPRINKLE) 125 MG capsule Take 250 mg by mouth 2 (two) times daily. 250 in the am and 125 mg in the pm  . docusate (COLACE) 50 MG/5ML liquid Take 100 mg by mouth daily.  Marland Kitchen gabapentin (NEURONTIN) 100 MG capsule Take 100 mg by mouth 3 (three) times daily.  Marland Kitchen guaiFENesin-codeine (ROBITUSSIN AC) 100-10 MG/5ML syrup Take 10 mLs by mouth 4 (four) times daily as needed for cough.  Marland Kitchen ipratropium-albuterol (DUONEB) 0.5-2.5 (3) MG/3ML SOLN Take 3 mLs by nebulization 3 (three) times daily.  Marland Kitchen ketoconazole (NIZORAL) 2 % shampoo Apply 1 application topically 2 (two) times a week. Pt uses on Tuesday and Friday.  . levofloxacin (LEVAQUIN) 500 MG tablet Take 500 mg by mouth 2 (two) times daily.  . magnesium hydroxide (MILK OF MAGNESIA) 400 MG/5ML suspension Take 15 mLs by mouth  daily as needed for mild constipation or moderate constipation.   . Melatonin 10 MG TABS Take 1 tablet by mouth at bedtime.  . memantine (NAMENDA) 10 MG tablet Take 10 mg by mouth 2 (two) times daily.  . naproxen sodium (ANAPROX) 220 MG tablet Take 220 mg by mouth every 8 (eight) hours as needed (for discomfort).   . Olopatadine HCl (PATADAY) 0.2 % SOLN Apply 1 drop to eye daily.  Marland Kitchen omeprazole (PRILOSEC) 20 MG capsule Take 20 mg daily by mouth.  . polyethylene glycol (MIRALAX / GLYCOLAX) packet Take 17 g by mouth every other day.   . saccharomyces boulardii (FLORASTOR) 250 MG capsule Take 250 mg by mouth 2 (two) times daily.  . sertraline (ZOLOFT) 100 MG tablet Take 100 mg by mouth at bedtime.  . solifenacin (VESICARE) 10 MG tablet Take 10 mg by mouth daily.  . [DISCONTINUED] sertraline (ZOLOFT) 100 MG tablet Take 0.5  tablets (50 mg total) by mouth daily. (Patient taking differently: Take 100 mg by mouth daily. )   No facility-administered encounter medications on file as of 09/04/2018.     Review of Systems  Constitutional: Positive for activity change, appetite change, fatigue and fever. Negative for chills.       Sweats  HENT: Positive for congestion.   Respiratory: Positive for cough and shortness of breath. Negative for chest tightness and wheezing.   Cardiovascular: Negative for chest pain and leg swelling.  Genitourinary:       Suprapubic cath in place with dark yellow to orange urine  Musculoskeletal: Positive for gait problem.       Uses manual high backed specialty wheelchair  Skin: Positive for pallor.       With flushed cheeks and warm to touch  Neurological: Positive for weakness. Negative for dizziness.  Psychiatric/Behavioral: Negative for sleep disturbance.       Currently just lethargic, not his usual self    Immunization History  Administered Date(s) Administered  . Influenza Inj Mdck Quad Pf 07/07/2016  . Influenza,inj,Quad PF,6+ Mos 07/10/2018  .  Influenza-Unspecified 07/02/2015, 07/13/2017  . Pneumococcal Conjugate-13 02/10/2015  . Pneumococcal Polysaccharide-23 10/23/2006  . Tdap 04/19/2005, 10/25/2016   Pertinent  Health Maintenance Due  Topic Date Due  . INFLUENZA VACCINE  Completed  . PNA vac Low Risk Adult  Completed   Fall Risk  01/10/2018 01/04/2017 07/04/2016 02/17/2016 08/23/2015  Falls in the past year? No No Yes No Yes  Number falls in past yr: - - 2 or more - 1  Injury with Fall? - - Yes - Yes  Comment - - - - -  Risk Factor Category  - - High Fall Risk - High Fall Risk  Risk for fall due to : - - History of fall(s);Impaired balance/gait - History of fall(s);Impaired balance/gait;Impaired mobility;Medication side effect;Mental status change  Follow up - - Falls evaluation completed - Falls evaluation completed;Education provided;Falls prevention discussed   Functional Status Survey:  dependent  Vitals:   09/04/18 1556  BP: 104/60  Pulse: 81  Resp: (!) 30  Temp: 99.9 F (37.7 C)  TempSrc: Oral  SpO2: 91%  Weight: 219 lb (99.3 kg)  Height: 6\' 1"  (1.854 m)   Body mass index is 28.89 kg/m. Physical Exam Constitutional:      Appearance: He is normal weight. He is ill-appearing and toxic-appearing.     Comments: Lethargic, lying in bed  HENT:     Head: Normocephalic and atraumatic.  Cardiovascular:     Rate and Rhythm: Normal rate and regular rhythm.     Pulses: Normal pulses.     Heart sounds: Normal heart sounds.  Pulmonary:     Breath sounds: Rhonchi present.     Comments: RR normal during my visit though 30 when vitals taken a few minutes before; rhonchi in left upper lobe Abdominal:     General: Bowel sounds are normal. There is no distension.     Palpations: Abdomen is soft. There is no mass.     Tenderness: There is no abdominal tenderness. There is no guarding or rebound.     Hernia: No hernia is present.  Skin:    Capillary Refill: Capillary refill takes less than 2 seconds.     Comments:  Warm and flushed  Neurological:     Comments: Opens eyes some during visit, but not following commands for deep breaths      Labs reviewed: Recent Labs  03/19/18 06/27/18  NA 139 141  K 4.3 4.4  BUN 17 18  CREATININE 0.7 0.7   No results for input(s): AST, ALT, ALKPHOS, BILITOT, PROT, ALBUMIN in the last 8760 hours. Recent Labs    03/19/18 06/27/18  WBC 7.1 6.5  HGB 14.9 14.7  HCT 44 43  PLT 172 182   Lab Results  Component Value Date   TSH 2.56 08/22/2017   Lab Results  Component Value Date   HGBA1C 5.7 (H) 01/13/2013   Lab Results  Component Value Date   CHOL 193 08/17/2016   HDL 46 08/17/2016   LDLCALC 133 08/17/2016   TRIG 86 08/17/2016   CHOLHDL 3.9 05/13/2014   Chest xray, labs, nursing notes reviewed. Assessment/Plan 1. Lobar pneumonia ([place]) -suspect based on fever persisting after doxy started and initial improvement but then decline clinically -d/c doxy -begin levaquin 500mg  po daily for 7 days -push fluids -cont duonebs -discussed with his wife and she agrees to keep him here and treat him as above, but if he is not improving, we will not plan to escalate care  -suspect he will pull out an IV b/c he was quite resistant to the flu swab with nursing a few days ago and he's pulled them out during other illness  2. Fever in other diseases -due to above -may give tylenol po or pr q6h prn fever or pain for 7 days  3. Dementia associated with other underlying disease with behavioral disturbance ([place]) -advanced, dependent in adls, but normally communicative and making remarks during visits, but very lethargic today and delirious due to his infection  Family/ staff Communication: discussed with his wife and with SNF nurse and pt's caregiver  Labs/tests ordered:  No new  Letrell Attwood L. Sundi Slevin, D.O. [facility] [address], [place] [address] Cell Phone (Mon-Fri 8am-5pm):  [phone] On Call:   [phone] & follow prompts after 5pm & weekends Office Phone:  [phone] Office Fax:  [phone]

## 2018-09-07 DIAGNOSIS — Z79899 Other long term (current) drug therapy: Secondary | ICD-10-CM | POA: Diagnosis not present

## 2018-09-07 DIAGNOSIS — R569 Unspecified convulsions: Secondary | ICD-10-CM | POA: Diagnosis not present

## 2018-09-27 ENCOUNTER — Other Ambulatory Visit: Payer: Self-pay

## 2018-09-27 ENCOUNTER — Encounter (HOSPITAL_COMMUNITY): Payer: Self-pay

## 2018-09-27 ENCOUNTER — Emergency Department (HOSPITAL_COMMUNITY)
Admission: EM | Admit: 2018-09-27 | Discharge: 2018-09-27 | Disposition: A | Discharge status: Home / Self Care | Payer: Medicare Other | Attending: Emergency Medicine | Admitting: Emergency Medicine

## 2018-09-27 DIAGNOSIS — Z7401 Bed confinement status: Secondary | ICD-10-CM | POA: Diagnosis not present

## 2018-09-27 DIAGNOSIS — R0602 Shortness of breath: Secondary | ICD-10-CM | POA: Diagnosis not present

## 2018-09-27 DIAGNOSIS — Z79899 Other long term (current) drug therapy: Secondary | ICD-10-CM | POA: Insufficient documentation

## 2018-09-27 DIAGNOSIS — G35 Multiple sclerosis: Secondary | ICD-10-CM | POA: Insufficient documentation

## 2018-09-27 DIAGNOSIS — W228XXA Striking against or struck by other objects, initial encounter: Secondary | ICD-10-CM | POA: Diagnosis not present

## 2018-09-27 DIAGNOSIS — Y9389 Activity, other specified: Secondary | ICD-10-CM | POA: Insufficient documentation

## 2018-09-27 DIAGNOSIS — S01111A Laceration without foreign body of right eyelid and periocular area, initial encounter: Secondary | ICD-10-CM | POA: Insufficient documentation

## 2018-09-27 DIAGNOSIS — S0181XA Laceration without foreign body of other part of head, initial encounter: Secondary | ICD-10-CM

## 2018-09-27 DIAGNOSIS — Y998 Other external cause status: Secondary | ICD-10-CM | POA: Diagnosis not present

## 2018-09-27 DIAGNOSIS — J449 Chronic obstructive pulmonary disease, unspecified: Secondary | ICD-10-CM | POA: Diagnosis not present

## 2018-09-27 DIAGNOSIS — Y92129 Unspecified place in nursing home as the place of occurrence of the external cause: Secondary | ICD-10-CM | POA: Insufficient documentation

## 2018-09-27 DIAGNOSIS — Z87891 Personal history of nicotine dependence: Secondary | ICD-10-CM | POA: Insufficient documentation

## 2018-09-27 DIAGNOSIS — F0390 Unspecified dementia without behavioral disturbance: Secondary | ICD-10-CM | POA: Diagnosis not present

## 2018-09-27 DIAGNOSIS — R531 Weakness: Secondary | ICD-10-CM | POA: Diagnosis not present

## 2018-09-27 DIAGNOSIS — S01112A Laceration without foreign body of left eyelid and periocular area, initial encounter: Secondary | ICD-10-CM | POA: Diagnosis not present

## 2018-09-27 DIAGNOSIS — S0993XA Unspecified injury of face, initial encounter: Secondary | ICD-10-CM | POA: Diagnosis present

## 2018-09-27 DIAGNOSIS — R0902 Hypoxemia: Secondary | ICD-10-CM | POA: Diagnosis not present

## 2018-09-27 DIAGNOSIS — M2550 Pain in unspecified joint: Secondary | ICD-10-CM | POA: Diagnosis not present

## 2018-09-27 NOTE — ED Notes (Signed)
[facility] called for transport.  

## 2018-09-27 NOTE — ED Triage Notes (Signed)
Pt BIB EMS from [facility]. Pt had a fall recently and has a laceration on his right eyebrow. Pt reportedly ripped off his steri strips and the facility is requesting a better reinforcement for the laceration. Hx of dementia. Laceration not actively bleeding at this time.
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
## 2018-10-08 ENCOUNTER — Encounter: Payer: Self-pay | Admitting: Adult Health

## 2018-10-08 ENCOUNTER — Non-Acute Institutional Stay (SKILLED_NURSING_FACILITY): Payer: Medicare Other | Admitting: Adult Health

## 2018-10-08 DIAGNOSIS — R05 Cough: Secondary | ICD-10-CM | POA: Diagnosis not present

## 2018-10-08 DIAGNOSIS — R0602 Shortness of breath: Secondary | ICD-10-CM | POA: Diagnosis not present

## 2018-10-08 DIAGNOSIS — R1319 Other dysphagia: Secondary | ICD-10-CM | POA: Diagnosis not present

## 2018-10-08 DIAGNOSIS — R059 Cough, unspecified: Secondary | ICD-10-CM

## 2018-10-08 DIAGNOSIS — G4483 Primary cough headache: Secondary | ICD-10-CM | POA: Diagnosis not present

## 2018-10-08 NOTE — Progress Notes (Signed)
Location:  Occupational psychologist of Service:  SNF (31) Provider:   Cindi Carbon, ANP [place] [phone]   [name], DO  Patient Care Team: [name], DO as PCP - General (Geriatric Medicine) [name], MD as Consulting Physician (Neurology) [name], MD as Consulting Physician (Urology) [name], MD as Consulting Physician (General Surgery) [name], MD as Consulting Physician (Orthopedic Surgery) [name], MD as Consulting Physician ([facility]) [name], MD as Consulting Physician (Gastroenterology) [name], DO as Consulting Physician (Geriatric Medicine)  Extended Emergency Contact Information Primary Emergency Contact: [name] Address: [address]          [place],  [address] [name] of [place] Phone: [phone] Mobile Phone: [phone] Relation: Spouse  Code Status:  DNR Goals of care: Advanced Directive information Advanced Directives 09/27/2018  Does Patient Have a Medical Advance Directive? (No Data)  Type of Advance Directive -  Does patient want to make changes to medical advance directive? -  Copy of [place] in Chart? -  Would patient like information on creating a medical advance directive? -  Pre-existing out of facility DNR order (yellow form or pink MOST form) -     Chief Complaint  Patient presents with  . Acute Visit    cough    HPI:  Pt is a 78 y.o. Rogers seen today for an acute visit for cough. He has a hx of progressive MS and dysphagia. He is becoming weaker over time and having trouble hold his head up. He has a brace for support that he uses as well as a supportive chair. The nurse reports that he is not eating well and states that he "hurts all over", has a sore throat, and does not feel well. His sats are WNL. He has a congested cough but no sputum production. No fever for my visit but ran a  low grade temp over the weekend of 100.5 x 1. He experienced a coughing episode after eating a meal with a reddened face on 1/19. He is currently on a D2 diet.    Past Medical History:  Diagnosis Date  . Adenomatous polyp   . Allergy   . BPH (benign prostatic hyperplasia)   . COPD (chronic obstructive pulmonary disease) ([place])   . Dementia ([place]) 2010  . Elevated PSA 07/31/2014  . Foot drop, right 2008  . 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 Generalized weakness 01/12/2013  . GERD (gastroesophageal reflux disease)   . Hyperlipidemia 07/31/2014  . Multiple sclerosis ([place]) 2008  . Prostatitis, chronic    Past Surgical History:  Procedure Laterality Date  . CATARACT EXTRACTION Left 02/06/12  . CATARACT EXTRACTION Right 2013  . ESOPHAGOGASTRODUODENOSCOPY ENDOSCOPY  04/16/2002   Dr. Earle Gell  . HERNIA REPAIR  1980'2000   3 inguinal hernia repairs on left  . HERNIA REPAIR  06/16/11   right inguinal hernia repair with mesh   . PROSTATE SURGERY  09/29/2008   Dr. Karsten Ro     Allergies  Allergen Reactions  . Aricept [Donepezil Hcl] Other (See Comments)    Reaction:  Unknown   . Dimethyl Fumarate Nausea And Vomiting    Outpatient Encounter Medications as of 10/08/2018  Medication Sig  . acetaminophen (TYLENOL) 650 MG suppository Place 650 mg rectally every 6 (six) hours as needed for fever.  . cetirizine (ZYRTEC) 10 MG tablet Take 10 mg by mouth at bedtime.  Marland Kitchen  ciclopirox (LOPROX) 0.77 % cream Apply 1 application topically 2 (two) times daily. Pt applies to face.  . divalproex (DEPAKOTE SPRINKLE) 125 MG capsule Take 250 mg by mouth 2 (two) times daily.   Marland Kitchen docusate (COLACE) 50 MG/5ML liquid Take 100 mg by mouth daily.  Marland Kitchen gabapentin (NEURONTIN) 100 MG capsule Take 100 mg by mouth 3 (three) times daily.  Marland Kitchen guaiFENesin-codeine (ROBITUSSIN AC) 100-10 MG/5ML syrup Take 10 mLs by mouth 4 (four) times daily as needed for cough.  Marland Kitchen ketoconazole (NIZORAL) 2 % shampoo Apply 1 application topically 2  (two) times a week. Pt uses on Tuesday and Friday.  . magnesium hydroxide (MILK OF MAGNESIA) 400 MG/5ML suspension Take 15 mLs by mouth daily as needed for mild constipation or moderate constipation.   . Melatonin 10 MG TABS Take 1 tablet by mouth at bedtime.  . memantine (NAMENDA) 10 MG tablet Take 10 mg by mouth 2 (two) times daily.  . naproxen sodium (ANAPROX) 220 MG tablet Take 220 mg by mouth every 8 (eight) hours as needed (for discomfort).   . Olopatadine HCl (PATADAY) 0.2 % SOLN Apply 1 drop to eye daily.  Marland Kitchen omeprazole (PRILOSEC) 20 MG capsule Take 20 mg daily by mouth.  . polyethylene glycol (MIRALAX / GLYCOLAX) packet Take 17 g by mouth every other day.   . sertraline (ZOLOFT) 100 MG tablet Take 100 mg by mouth at bedtime.  . solifenacin (VESICARE) 10 MG tablet Take 10 mg by mouth daily.  . [DISCONTINUED] ipratropium-albuterol (DUONEB) 0.5-2.5 (3) MG/3ML SOLN Take 3 mLs by nebulization 3 (three) times daily.   No facility-administered encounter medications on file as of 10/08/2018.     Review of Systems  Constitutional: Positive for activity change, appetite change and fatigue. Negative for chills, diaphoresis, fever and unexpected weight change.  HENT: Positive for congestion, drooling, rhinorrhea, sore throat and trouble swallowing. Negative for ear discharge, ear pain, sinus pressure, sinus pain, sneezing, tinnitus and voice change.   Respiratory: Positive for cough and wheezing. Negative for shortness of breath.   Cardiovascular: Positive for leg swelling. Negative for chest pain and palpitations.  Gastrointestinal: Negative for abdominal distention, constipation and diarrhea.  Musculoskeletal: Positive for gait problem.  Neurological: Positive for weakness. Negative for seizures, syncope, speech difficulty, light-headedness, numbness and headaches.  Psychiatric/Behavioral: Positive for behavioral problems and confusion. Negative for agitation.    Immunization History    Administered Date(s) Administered  . Influenza Inj Mdck Quad Pf 07/07/2016  . Influenza,inj,Quad PF,6+ Mos 07/10/2018  . Influenza-Unspecified 07/02/2015, 07/13/2017  . Pneumococcal Conjugate-13 02/10/2015  . Pneumococcal Polysaccharide-23 10/23/2006  . Tdap 04/19/2005, 10/25/2016   Pertinent  Health Maintenance Due  Topic Date Due  . INFLUENZA VACCINE  Completed  . PNA vac Low Risk Adult  Completed   Fall Risk  01/10/2018 01/04/2017 07/04/2016 02/17/2016 08/23/2015  Falls in the past year? No No Yes No Yes  Number falls in past yr: - - 2 or more - 1  Injury with Fall? - - Yes - Yes  Comment - - - - -  Risk Factor Category  - - High Fall Risk - High Fall Risk  Risk for fall due to : - - History of fall(s);Impaired balance/gait - History of fall(s);Impaired balance/gait;Impaired mobility;Medication side effect;Mental status change  Follow up - - Falls evaluation completed - Falls evaluation completed;Education provided;Falls prevention discussed   Functional Status Survey:    There were no vitals filed for this visit. There is no height or weight on  file to calculate BMI. Physical Exam Vitals signs and nursing note reviewed.  Constitutional:      General: He is not in acute distress.    Appearance: He is not diaphoretic.  HENT:     Head: Normocephalic and atraumatic.     Nose: Congestion present.     Mouth/Throat:     Mouth: Mucous membranes are moist.     Pharynx: Oropharynx is clear. Posterior oropharyngeal erythema present. No oropharyngeal exudate.  Eyes:     General:        Right eye: No discharge.        Left eye: No discharge.     Conjunctiva/sclera: Conjunctivae normal.     Pupils: Pupils are equal, round, and reactive to light.  Neck:     Musculoskeletal: Normal range of motion and neck supple.     Thyroid: No thyromegaly.     Vascular: No JVD.     Trachea: No tracheal deviation.  Cardiovascular:     Rate and Rhythm: Normal rate and regular rhythm.     Heart  sounds: No murmur.  Pulmonary:     Effort: Pulmonary effort is normal. No respiratory distress.     Breath sounds: Wheezing present.     Comments: Decreased bases, occas exp wheeze Abdominal:     General: Bowel sounds are normal. There is no distension.     Palpations: Abdomen is soft.     Tenderness: There is no abdominal tenderness.  Lymphadenopathy:     Cervical: No cervical adenopathy.  Skin:    General: Skin is warm and dry.     Findings: Rash (erythema to face) present.  Neurological:     Mental Status: He is alert and oriented to person, place, and time.     Cranial Nerves: No cranial nerve deficit.  Psychiatric:        Mood and Affect: Mood normal.     Labs reviewed: Recent Labs    03/19/18 06/27/18  NA 139 141  K 4.3 4.4  BUN 17 18  CREATININE 0.7 0.7   No results for input(s): AST, ALT, ALKPHOS, BILITOT, PROT, ALBUMIN in the last 8760 hours. Recent Labs    03/19/18 06/27/18  WBC 7.1 6.5  HGB 14.9 14.7  HCT 44 43  PLT 172 182   Lab Results  Component Value Date   TSH 2.56 08/22/2017   Lab Results  Component Value Date   HGBA1C 5.7 (H) 01/13/2013   Lab Results  Component Value Date   CHOL 193 08/17/2016   HDL 46 08/17/2016   LDLCALC 133 08/17/2016   TRIG 86 08/17/2016   CHOLHDL 3.9 05/13/2014    Significant Diagnostic Results in last 30 days:  No results found.  Assessment/Plan 1. Cough Flu and Strep swab (due to sore throat and erythema) pending Duonebs tid and q 6 hrs prn Continue to monitor vital signs and report fever or decreased 02 sats Encourage fluids  2. Dysphagia causing pulmonary aspiration with swallowing Seems to be worsening Needs to be 1:1 with all meals ST eval/tx    Family/ staff Communication: discussed with Vickii Chafe his wife  We discussed not treating this illness if it progresses given his poor quality of life, worsening dysphagia, and underlying progressive MS. She will discuss with her family and get back with [place]. If  he has a contagious illness such as the flu he will need to be treated.  Labs/tests ordered:  NA

## 2018-10-09 ENCOUNTER — Non-Acute Institutional Stay (SKILLED_NURSING_FACILITY): Payer: Medicare Other | Admitting: Internal Medicine

## 2018-10-09 ENCOUNTER — Encounter: Payer: Self-pay | Admitting: Internal Medicine

## 2018-10-09 DIAGNOSIS — F0281 Dementia in other diseases classified elsewhere with behavioral disturbance: Secondary | ICD-10-CM | POA: Diagnosis not present

## 2018-10-09 DIAGNOSIS — R1319 Other dysphagia: Secondary | ICD-10-CM | POA: Diagnosis not present

## 2018-10-09 DIAGNOSIS — G35 Multiple sclerosis: Secondary | ICD-10-CM

## 2018-10-09 DIAGNOSIS — J690 Pneumonitis due to inhalation of food and vomit: Secondary | ICD-10-CM

## 2018-10-09 DIAGNOSIS — F02818 Dementia in other diseases classified elsewhere, unspecified severity, with other behavioral disturbance: Secondary | ICD-10-CM

## 2018-10-09 DIAGNOSIS — Z7189 Other specified counseling: Secondary | ICD-10-CM | POA: Diagnosis not present

## 2018-10-09 NOTE — Progress Notes (Signed)
Location:  Occupational psychologist of Service:  SNF (31) Provider:  Ruta Capece L. Mariea Clonts, D.O., C.M.D.  [name], DO  Patient Care Team: [name], DO as PCP - General (Geriatric Medicine) [name], MD as Consulting Physician (Neurology) [name], MD as Consulting Physician (Urology) [name], MD as Consulting Physician (General Surgery) [name], MD as Consulting Physician (Orthopedic Surgery) [name], MD as Consulting Physician (H[facility]ird Surgery) [name], MD as Consulting Physician (Gastroenterology) [name], DO as Consulting Physician (Geriatric Medicine)  Extended Emergency Contact Information Primary Emergency Contact: [name],[name] Address: [address]          [place], [name] [address] [name] of [place] Phone: [phone] Mobile Phone: [phone] Relation: Spouse  Code Status:  DNR, MOST Goals of care: Advanced Directive information Advanced Directives 09/27/2018  Does Patient Have a Medical Advance Directive? (No Data)  Type of Advance Directive -  Does patient want to make changes to medical advance directive? -  Copy of [place] in Chart? -  Would patient like information on creating a medical advance directive? -  Pre-existing out of facility DNR order (yellow form or pink MOST form) -     Chief Complaint  Patient presents with  . Acute Visit    pt's wife requested eval and phone call re: pneumonia    HPI:  Pt is a 78 y.o. male seen today for an acute visit for ongoing hypoactive delirium, coughing, malaise.  [name] is a 78 yo male with h/o MS, dementia and dysphagia with recently recurrent aspiration pneumonias.  He currently is suffering with cough and has been lethargic and minimally responsive with decreased po intake the past few days.  He actually perked up a bit today and was up in his wheelchair some of the day, interacting a bit more.  When  I saw him, he remained very congested, his caregiver at bedside reported she noticed him having some distress when he would cough or lean forward.  He admitted to pain with coughing.  His voice was hoarse and he was very nasal making his voice quite soft to hear today.  He remained drowsy and was requesting to return to bed.  In the past few months, [name] has declined considerably with some increased inappropriate behaviors/verbal outbursts and the increased dysphagia and pneumonias.  His wife is quite cognicent of his changes and wants to be sure he is not suffering.  She and NP Wert had a talk yesterday about his aspiration.  They opted to involved ST to evaluate his diet (which is being changed to puree) and see how he does.  She was debating whether she wanted an xray or treatment of his pneumonia.  We discussed today that since the diet change is new, it would make sense to see how he tolerates it and treat him this time with augmentin.  She feels like she needs more support to make further decisions about [name] care.  I recommended we involve hospice at this point and she was all for it.     Past Medical History:  Diagnosis Date  . Adenomatous polyp   . Allergy   . BPH (benign prostatic hyperplasia)   . COPD (chronic obstructive pulmonary disease) ([place])   . Dementia ([place]) 2010  . Elevated PSA 07/31/2014  . Foot drop, right 2008  . 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 Generalized weakness 01/12/2013  . GERD (gastroesophageal reflux disease)   .  Hyperlipidemia 07/31/2014  . Multiple sclerosis ([place]) 2008  . Prostatitis, chronic    Past Surgical History:  Procedure Laterality Date  . CATARACT EXTRACTION Left 02/06/12  . CATARACT EXTRACTION Right 2013  . ESOPHAGOGASTRODUODENOSCOPY ENDOSCOPY  04/16/2002   Dr. Earle Gell  . HERNIA REPAIR  1980'2000   3 inguinal hernia repairs on left  . HERNIA REPAIR  06/16/11   right inguinal hernia repair with mesh   . PROSTATE SURGERY  09/29/2008   Dr.  Karsten Ro     Allergies  Allergen Reactions  . Aricept [Donepezil Hcl] Other (See Comments)    Reaction:  Unknown   . Dimethyl Fumarate Nausea And Vomiting    Outpatient Encounter Medications as of 10/09/2018  Medication Sig  . acetaminophen (TYLENOL) 650 MG suppository Place 650 mg rectally every 6 (six) hours as needed for fever.  . cetirizine (ZYRTEC) 10 MG tablet Take 10 mg by mouth at bedtime.  . ciclopirox (LOPROX) 0.77 % cream Apply 1 application topically 2 (two) times daily. Pt applies to face.  . divalproex (DEPAKOTE SPRINKLE) 125 MG capsule Take 250 mg by mouth 2 (two) times daily.   Marland Kitchen docusate (COLACE) 50 MG/5ML liquid Take 100 mg by mouth daily.  Marland Kitchen gabapentin (NEURONTIN) 100 MG capsule Take 100 mg by mouth 3 (three) times daily.  Marland Kitchen guaiFENesin-codeine (ROBITUSSIN AC) 100-10 MG/5ML syrup Take 10 mLs by mouth 4 (four) times daily as needed for cough.  Marland Kitchen ketoconazole (NIZORAL) 2 % shampoo Apply 1 application topically 2 (two) times a week. Pt uses on Tuesday and Friday.  . magnesium hydroxide (MILK OF MAGNESIA) 400 MG/5ML suspension Take 15 mLs by mouth daily as needed for mild constipation or moderate constipation.   . Melatonin 10 MG TABS Take 1 tablet by mouth at bedtime.  . memantine (NAMENDA) 10 MG tablet Take 10 mg by mouth 2 (two) times daily.  . naproxen sodium (ANAPROX) 220 MG tablet Take 220 mg by mouth every 8 (eight) hours as needed (for discomfort).   . Olopatadine HCl (PATADAY) 0.2 % SOLN Apply 1 drop to eye daily.  Marland Kitchen omeprazole (PRILOSEC) 20 MG capsule Take 20 mg daily by mouth.  . polyethylene glycol (MIRALAX / GLYCOLAX) packet Take 17 g by mouth every other day.   . sertraline (ZOLOFT) 100 MG tablet Take 100 mg by mouth at bedtime.  . solifenacin (VESICARE) 10 MG tablet Take 10 mg by mouth daily.   No facility-administered encounter medications on file as of 10/09/2018.     Review of Systems  Constitutional: Positive for malaise/fatigue. Negative for chills  and fever.  HENT: Positive for congestion.        Hoarseness  Eyes:       Glasses  Respiratory: Positive for cough and shortness of breath. Negative for sputum production and wheezing.   Cardiovascular: Positive for chest pain. Negative for leg swelling.  Genitourinary: Negative for dysuria.  Musculoskeletal: Negative for falls.  Skin: Negative for rash.  Neurological: Negative for loss of consciousness and headaches.  Psychiatric/Behavioral: Positive for memory loss. Negative for depression.    Immunization History  Administered Date(s) Administered  . Influenza Inj Mdck Quad Pf 07/07/2016  . Influenza,inj,Quad PF,6+ Mos 07/10/2018  . Influenza-Unspecified 07/02/2015, 07/13/2017  . Pneumococcal Conjugate-13 02/10/2015  . Pneumococcal Polysaccharide-23 10/23/2006  . Tdap 04/19/2005, 10/25/2016   Pertinent  Health Maintenance Due  Topic Date Due  . INFLUENZA VACCINE  Completed  . PNA vac Low Risk Adult  Completed   Fall Risk  01/10/2018 01/04/2017 07/04/2016 02/17/2016 08/23/2015  Falls in the past year? No No Yes No Yes  Number falls in past yr: - - 2 or more - 1  Injury with Fall? - - Yes - Yes  Comment - - - - -  Risk Factor Category  - - High Fall Risk - High Fall Risk  Risk for fall due to : - - History of fall(s);Impaired balance/gait - History of fall(s);Impaired balance/gait;Impaired mobility;Medication side effect;Mental status change  Follow up - - Falls evaluation completed - Falls evaluation completed;Education provided;Falls prevention discussed   Functional Status Survey:    Vitals:   10/09/18 2020  BP: 131/81  Pulse: 80  Resp: 20  Temp: 98.3 F (36.8 C)  SpO2: 90%  Weight: 219 lb 3.2 oz (99.4 kg)  Height: 6\' 1"  (1.854 m)   Body mass index is 28.92 kg/m. Physical Exam Constitutional:      General: He is not in acute distress.    Appearance: He is ill-appearing. He is not toxic-appearing.  HENT:     Head: Normocephalic and atraumatic.     Nose:  Congestion present.     Mouth/Throat:     Mouth: Mucous membranes are moist.     Pharynx: No oropharyngeal exudate.  Eyes:     Conjunctiva/sclera: Conjunctivae normal.     Comments: glasses  Pulmonary:     Breath sounds: Rhonchi present.     Comments: Right upper lobe Abdominal:     General: Bowel sounds are normal.  Musculoskeletal:     Comments: Chronic foot drop; in wheelchair, head tilted to left, drooling some   Skin:    General: Skin is warm and dry.     Comments: Seborrhea of face   Neurological:     Mental Status: He is disoriented.     Comments: Confused, sentences not making sense  Psychiatric:        Mood and Affect: Mood normal.    Labs reviewed: Recent Labs    03/19/18 06/27/18  NA 139 141  K 4.3 4.4  BUN 17 18  CREATININE 0.7 0.7   No results for input(s): AST, ALT, ALKPHOS, BILITOT, PROT, ALBUMIN in the last 8760 hours. Recent Labs    03/19/18 06/27/18  WBC 7.1 6.5  HGB 14.9 14.7  HCT 44 43  PLT 172 182   Lab Results  Component Value Date   TSH 2.56 08/22/2017   Lab Results  Component Value Date   HGBA1C 5.7 (H) 01/13/2013   Lab Results  Component Value Date   CHOL 193 08/17/2016   HDL 46 08/17/2016   LDLCALC 133 08/17/2016   TRIG 86 08/17/2016   CHOLHDL 3.9 05/13/2014    Assessment/Plan 1. Recurrent aspiration pneumonia ([place]) -second episode within a month  -dysphagia is progressing -ST was consulted and recommended pureed diet for him--this is new -discussed seeing how this goes so treating this episode with abx and if he does not do well with the change in diet (losing weight, not liking it, or still having pneumonias), we may need to considering no longer treating the pneumonias--seems [name] was already thinking that it might be time to stop treating [name]'s infections b/c his quality of life is poor at times, but she thinks there are times when there is some quality left -we opted to get hospice involved to help support her  decision-making further at this point  2. Dysphagia causing pulmonary aspiration with swallowing -[name] just recommended Pureed diet, see how it goes,  caregivers to feed him his meals as his wife requested  3. 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 Multiple sclerosis ([place]) -cause of #4, has led to increased physical debility and neurogenic bladder, as well, cont skilled care with full adl support and hoyer for transfers  Family/ staff Communication: spoke with [name] by phone; discussed also with caregiver at bedside and nurse manager  ACP:  As described above.  Will consult hospice and palliative care of [facility] now to help support his wife further as she makes decisions about stopping treatments for his pneumonias.  She is struggling to assess his quality of life at this time.  He does have a DNR and a MOST form and [name], his wife, is his HCPOA and durable POA. 20 mins spent on ACP today  Labs/tests ordered:  No new  Adysson Revelle L. Olivene Cookston, D.O. [facility] [address], [place] [address] Cell Phone (Mon-Fri 8am-5pm):  [phone] On Call:  [phone] & follow prompts after 5pm & weekends Office Phone:  [phone] Office Fax:  [phone]

## 2018-10-09 NOTE — ACP (Advance Care Planning) (Signed)
Will consult hospice and palliative care of [facility] now to help support his wife further as she makes decisions about stopping treatments for his pneumonias.  She is struggling to assess his quality of life at this time.  He does have a DNR and a MOST form and Vickii Chafe, his wife, is his HCPOA and durable POA.  20 mins spent on ACP today

## 2018-10-11 DIAGNOSIS — R1312 Dysphagia, oropharyngeal phase: Secondary | ICD-10-CM | POA: Diagnosis not present

## 2018-10-11 DIAGNOSIS — G35 Multiple sclerosis: Secondary | ICD-10-CM | POA: Diagnosis not present

## 2018-10-15 DIAGNOSIS — G35 Multiple sclerosis: Secondary | ICD-10-CM | POA: Diagnosis not present

## 2018-10-15 DIAGNOSIS — R1312 Dysphagia, oropharyngeal phase: Secondary | ICD-10-CM | POA: Diagnosis not present

## 2018-10-16 DIAGNOSIS — R1312 Dysphagia, oropharyngeal phase: Secondary | ICD-10-CM | POA: Diagnosis not present

## 2018-10-16 DIAGNOSIS — G35 Multiple sclerosis: Secondary | ICD-10-CM | POA: Diagnosis not present

## 2018-10-17 DIAGNOSIS — R1312 Dysphagia, oropharyngeal phase: Secondary | ICD-10-CM | POA: Diagnosis not present

## 2018-10-17 DIAGNOSIS — G35 Multiple sclerosis: Secondary | ICD-10-CM | POA: Diagnosis not present

## 2018-10-21 DIAGNOSIS — G35 Multiple sclerosis: Secondary | ICD-10-CM | POA: Diagnosis not present

## 2018-10-21 DIAGNOSIS — R1312 Dysphagia, oropharyngeal phase: Secondary | ICD-10-CM | POA: Diagnosis not present

## 2018-10-21 DIAGNOSIS — R293 Abnormal posture: Secondary | ICD-10-CM | POA: Diagnosis not present

## 2018-10-22 DIAGNOSIS — R293 Abnormal posture: Secondary | ICD-10-CM | POA: Diagnosis not present

## 2018-10-22 DIAGNOSIS — R1312 Dysphagia, oropharyngeal phase: Secondary | ICD-10-CM | POA: Diagnosis not present

## 2018-10-22 DIAGNOSIS — G35 Multiple sclerosis: Secondary | ICD-10-CM | POA: Diagnosis not present

## 2018-10-23 DIAGNOSIS — R293 Abnormal posture: Secondary | ICD-10-CM | POA: Diagnosis not present

## 2018-10-23 DIAGNOSIS — G35 Multiple sclerosis: Secondary | ICD-10-CM | POA: Diagnosis not present

## 2018-10-23 DIAGNOSIS — R1312 Dysphagia, oropharyngeal phase: Secondary | ICD-10-CM | POA: Diagnosis not present

## 2018-10-24 DIAGNOSIS — G35 Multiple sclerosis: Secondary | ICD-10-CM | POA: Diagnosis not present

## 2018-10-24 DIAGNOSIS — R1312 Dysphagia, oropharyngeal phase: Secondary | ICD-10-CM | POA: Diagnosis not present

## 2018-10-24 DIAGNOSIS — R293 Abnormal posture: Secondary | ICD-10-CM | POA: Diagnosis not present

## 2018-10-26 DIAGNOSIS — G35 Multiple sclerosis: Secondary | ICD-10-CM | POA: Diagnosis not present

## 2018-10-26 DIAGNOSIS — R1312 Dysphagia, oropharyngeal phase: Secondary | ICD-10-CM | POA: Diagnosis not present

## 2018-10-26 DIAGNOSIS — R293 Abnormal posture: Secondary | ICD-10-CM | POA: Diagnosis not present

## 2018-10-27 DIAGNOSIS — G35 Multiple sclerosis: Secondary | ICD-10-CM | POA: Diagnosis not present

## 2018-10-27 DIAGNOSIS — R293 Abnormal posture: Secondary | ICD-10-CM | POA: Diagnosis not present

## 2018-10-27 DIAGNOSIS — R1312 Dysphagia, oropharyngeal phase: Secondary | ICD-10-CM | POA: Diagnosis not present

## 2018-10-29 DIAGNOSIS — R293 Abnormal posture: Secondary | ICD-10-CM | POA: Diagnosis not present

## 2018-10-29 DIAGNOSIS — R1312 Dysphagia, oropharyngeal phase: Secondary | ICD-10-CM | POA: Diagnosis not present

## 2018-10-29 DIAGNOSIS — G35 Multiple sclerosis: Secondary | ICD-10-CM | POA: Diagnosis not present

## 2018-10-30 DIAGNOSIS — R1312 Dysphagia, oropharyngeal phase: Secondary | ICD-10-CM | POA: Diagnosis not present

## 2018-10-30 DIAGNOSIS — R293 Abnormal posture: Secondary | ICD-10-CM | POA: Diagnosis not present

## 2018-10-30 DIAGNOSIS — G35 Multiple sclerosis: Secondary | ICD-10-CM | POA: Diagnosis not present

## 2018-10-31 DIAGNOSIS — G35 Multiple sclerosis: Secondary | ICD-10-CM | POA: Diagnosis not present

## 2018-10-31 DIAGNOSIS — R1312 Dysphagia, oropharyngeal phase: Secondary | ICD-10-CM | POA: Diagnosis not present

## 2018-10-31 DIAGNOSIS — R293 Abnormal posture: Secondary | ICD-10-CM | POA: Diagnosis not present

## 2018-11-01 DIAGNOSIS — R1312 Dysphagia, oropharyngeal phase: Secondary | ICD-10-CM | POA: Diagnosis not present

## 2018-11-01 DIAGNOSIS — R293 Abnormal posture: Secondary | ICD-10-CM | POA: Diagnosis not present

## 2018-11-01 DIAGNOSIS — G35 Multiple sclerosis: Secondary | ICD-10-CM | POA: Diagnosis not present

## 2018-11-02 ENCOUNTER — Telehealth: Payer: Self-pay

## 2018-11-02 NOTE — Telephone Encounter (Signed)
Melissa (patient's daughter) called as a result of speaking with Christy [facility] NP). Per Alyse Low recommendations patient's children (Melissa and Richard) and wife Vickii Chafe) would like to have a conference call with Dr.[name] on Tuesday 11/13/2018 since Dr.[name] is not available to meet in person on Friday  Melissa would like a return call to confirm that this is possible, ok to leave message on voicemail  Melissa aware Dr.[name] and her Burton are out of office and we will follow-up once response provided   Please advise

## 2018-11-04 NOTE — Telephone Encounter (Signed)
I can speak with them after 3:30pm on Wednesday (not Tuesday--I am off Tuesday) as long as more patients are not added to the schedule at that time.  Please block the slots after the new patient so we can have this long-awaited discussion.  Thanks.

## 2018-11-05 DIAGNOSIS — G35 Multiple sclerosis: Secondary | ICD-10-CM | POA: Diagnosis not present

## 2018-11-05 DIAGNOSIS — R1312 Dysphagia, oropharyngeal phase: Secondary | ICD-10-CM | POA: Diagnosis not present

## 2018-11-05 DIAGNOSIS — R293 Abnormal posture: Secondary | ICD-10-CM | POA: Diagnosis not present

## 2018-11-05 NOTE — Telephone Encounter (Signed)
Spoke with daughter and she is requesting a phone conference with Dr. Mariea Clonts, per daughter, the line of communication is horrible. Daughter states Alyse Low told her you would be available on 11/13/2018 @ 2:30pm. I expressed to daughter that I can not say weather or not Dr. Mariea Clonts will be available at this time and that I would ask Dr. Mariea Clonts what day is better for her. I also expressed we thought this was a face to face. I asked daughter if she spoke with any nurse managers at [facility] and she was told this was how she could communicate with Dr. Mariea Clonts.

## 2018-11-05 NOTE — Telephone Encounter (Signed)
.  left message to have patient return my call.  

## 2018-11-07 DIAGNOSIS — R293 Abnormal posture: Secondary | ICD-10-CM | POA: Diagnosis not present

## 2018-11-07 DIAGNOSIS — G35 Multiple sclerosis: Secondary | ICD-10-CM | POA: Diagnosis not present

## 2018-11-07 DIAGNOSIS — R1312 Dysphagia, oropharyngeal phase: Secondary | ICD-10-CM | POA: Diagnosis not present

## 2018-11-07 NOTE — Telephone Encounter (Signed)
Next Tuesday is fine.  I understood it to be this Tuesday (yesterday).

## 2018-11-08 DIAGNOSIS — R293 Abnormal posture: Secondary | ICD-10-CM | POA: Diagnosis not present

## 2018-11-08 DIAGNOSIS — G35 Multiple sclerosis: Secondary | ICD-10-CM | POA: Diagnosis not present

## 2018-11-08 DIAGNOSIS — R1312 Dysphagia, oropharyngeal phase: Secondary | ICD-10-CM | POA: Diagnosis not present

## 2018-11-08 NOTE — Telephone Encounter (Signed)
[name] will communicate with pt's daughter about place and time.

## 2018-11-09 DIAGNOSIS — R1312 Dysphagia, oropharyngeal phase: Secondary | ICD-10-CM | POA: Diagnosis not present

## 2018-11-09 DIAGNOSIS — G35 Multiple sclerosis: Secondary | ICD-10-CM | POA: Diagnosis not present

## 2018-11-09 DIAGNOSIS — R293 Abnormal posture: Secondary | ICD-10-CM | POA: Diagnosis not present

## 2018-11-13 ENCOUNTER — Non-Acute Institutional Stay (SKILLED_NURSING_FACILITY): Payer: Medicare Other | Admitting: Internal Medicine

## 2018-11-13 DIAGNOSIS — Z7189 Other specified counseling: Secondary | ICD-10-CM | POA: Diagnosis not present

## 2018-11-14 DIAGNOSIS — G35 Multiple sclerosis: Secondary | ICD-10-CM | POA: Diagnosis not present

## 2018-11-14 DIAGNOSIS — R1312 Dysphagia, oropharyngeal phase: Secondary | ICD-10-CM | POA: Diagnosis not present

## 2018-11-14 DIAGNOSIS — R293 Abnormal posture: Secondary | ICD-10-CM | POA: Diagnosis not present

## 2018-11-14 NOTE — ACP (Advance Care Planning) (Signed)
Spoke with [name]'s step children, [name] and [name] (in [place]) about [name]'s condition.  They had requested a phone call because they disagreed with the hospice referral I had recommended for [name].  His wife, [name], his primary HCPOA, was in agreement with the hospice referral to assist her in decision-making in terms of antibiotics when he has another aspiration episode.   I've been seeing him now for nearly 5 years here at [facility].    [name] expressed concerns that staff do not want to provide the necessary care to [name] and that is part of why hospice is being recommended.  She thinks that his aspiration episodes could have been prevented if the [name] collar that is now in use had been on before the last episodes.  We had discussed how the resident does not like to wear the collar and seems uncomfortable.  She and [name] note that in their family they accept people in their current states even though it's not how they were in the past and try to celebrate them as they are.  They see [name] as very happy and content when they visit him about monthly or less.  They express that they would like PT, OT and speech services to remain possibilities for [name] to help with his comfort.  I reinforced that our goals for [name] as his health care team are to keep to him comfortable and make his quality of life as good as it can be.  The intention of the hospice referral was to reinforce this and to provide support for his wife who is here and struggles to make difficult decisions about him.  The stepchildren do not feel [name] is at the end of life at this point as he's been stable since his last episode of aspiration and the institution of the [facility] collar.  [name], a Psychologist, clinical, also reports that there are things we could still be doing better to prevent aspiration and could have done before.  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  After it  became clear that [name] stepchildren would NOT accept even a referral to hospice for a discussion, I brought up palliative care.  I educated them on what palliative care is (though we already do quite a bit of it) and how they could reassess [name] regularly and also help to support [name].  [name] went on to say that we shouldn't be so worried about [name] and should be talking about [name]'s quality of life only.  After much back and forth, they did finally agree to involve palliative care.  Hopefully, this service will see him regularly and help [place] to guide [name] about when to again broach the hospice topic.    Documents:  Living will, health care POA, DNR and MOST all on file.    Time spent:  28 minutes.

## 2018-11-15 DIAGNOSIS — R293 Abnormal posture: Secondary | ICD-10-CM | POA: Diagnosis not present

## 2018-11-15 DIAGNOSIS — R1312 Dysphagia, oropharyngeal phase: Secondary | ICD-10-CM | POA: Diagnosis not present

## 2018-11-15 DIAGNOSIS — G35 Multiple sclerosis: Secondary | ICD-10-CM | POA: Diagnosis not present

## 2018-11-16 ENCOUNTER — Encounter: Payer: Self-pay | Admitting: Internal Medicine

## 2018-11-16 ENCOUNTER — Non-Acute Institutional Stay: Payer: Medicare Other | Admitting: Licensed Clinical Social Worker

## 2018-11-16 DIAGNOSIS — G35 Multiple sclerosis: Secondary | ICD-10-CM | POA: Diagnosis not present

## 2018-11-16 DIAGNOSIS — Z515 Encounter for palliative care: Secondary | ICD-10-CM

## 2018-11-16 DIAGNOSIS — R1312 Dysphagia, oropharyngeal phase: Secondary | ICD-10-CM | POA: Diagnosis not present

## 2018-11-16 DIAGNOSIS — R293 Abnormal posture: Secondary | ICD-10-CM | POA: Diagnosis not present

## 2018-11-16 NOTE — Progress Notes (Signed)
Last ACP Note 08/18/2018 to 11/16/2018       ACP (Advance Care Planning) by [name], DO at 11/13/2018 11:59 PM    Date of Service   Author Author Type Status Note Type File Time  11/13/2018 Addend [name], DO Physician Signed ACP (Advance Care Planning) 11/16/2018             Spoke with Bolivar's step children, Burnice Logan and Richard (in [place]) about Kimo's condition.  They had requested a phone call because they disagreed with the hospice referral I had recommended for [facility].  His wife, Vickii Chafe, his primary HCPOA, was in agreement with the hospice referral to assist her in decision-making in terms of antibiotics when he has another aspiration episode.   I've been seeing him now for nearly 5 years here at [facility].    Melissa expressed concerns that staff do not want to provide the necessary care to [facility] and that is part of why hospice is being recommended.  She thinks that his aspiration episodes could have been prevented if the [facility] collar that is now in use had been on before the last episodes.  We had discussed how the resident does not like to wear the collar and seems uncomfortable.  She and Richard note that in their family they accept people in their current states even though it's not how they were in the past and try to celebrate them as they are.  They see Kodey as very happy and content when they visit him about monthly or less.  They express that they would like PT, OT and speech services to remain possibilities for Tilman to help with his comfort.  I reinforced that our goals for Mickie as his health care team are to keep to him comfortable and make his quality of life as good as it can be.  The intention of the hospice referral was to reinforce this and to provide support for his wife who is here and struggles to make difficult decisions about him.  The stepchildren do not feel Eliu is at the end of life at this point as he's been stable since his last episode of  aspiration and the institution of the [facility] collar.  Melissa, a Psychologist, clinical, also reports that there are things we could still be doing better to prevent aspiration and could have done before.  We discussed Kyon's chronic progressive dementia and multiple sclerosis.  After it became clear that Aaiden's stepchildren would NOT accept even a referral to hospice for a discussion, I brought up palliative care.  I educated them on what palliative care is (though we already do quite a bit of it) and how they could reassess Jadien regularly and also help to support Peggy.  Melissa went on to say that we shouldn't be so worried about Vickii Chafe and should be talking about Ronie's quality of life only.  After much back and forth, they did finally agree to involve palliative care.  Hopefully, this service will see him regularly and help [place] to guide Peggy about when to again broach the hospice topic.    Documents:  Living will, health care POA, DNR and MOST all on file.    Time spent:  28 minutes.             More notes were found than are currently displayed.    Naoma Boxell L. Shermar Friedland, D.O. [facility] [address], [place] [address] Cell  Phone (Mon-Fri 8am-5pm):  [phone] On Call:  [phone] & follow prompts after 5pm & weekends Office Phone:  [phone] Office Fax:  [phone]

## 2018-11-19 DIAGNOSIS — G35 Multiple sclerosis: Secondary | ICD-10-CM | POA: Diagnosis not present

## 2018-11-19 DIAGNOSIS — R293 Abnormal posture: Secondary | ICD-10-CM | POA: Diagnosis not present

## 2018-11-19 NOTE — Progress Notes (Signed)
COMMUNITY PALLIATIVE CARE SW NOTE  PATIENT NAME: [name] DOB: 05/22/1941 MRN: [id number]  PRIMARY CARE PROVIDER: [name], DO  RESPONSIBLE PARTY:  Acct ID - Guarantor Home Phone Work Phone Relationship Acct Type  [id number] Jose [phone]  Self P/F     [address], [name], [name] [address]     PLAN OF CARE and INTERVENTIONS:             1. GOALS OF CARE/ ADVANCE CARE PLANNING:  Goal is to remain at the facility.  Patient has a MOST form and DNR. 2. SOCIAL/EMOTIONAL/SPIRITUAL ASSESSMENT/ INTERVENTIONS:  SW met with [facility] at [name].  He was in his w/c attending an activity in the auditorium.  He was alert and oriented to person only.  Patient was born in [place], [place].  He graduated from [name] with a law degree.  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  He married [name] in 2001 and has five step children.  Patient is a member of [name].  SW consulted facility RN, [name].   3. PATIENT/CAREGIVER EDUCATION/ COPING:  SW provided education to the facility staff regarding the Palliative Care Program. 4. PERSONAL EMERGENCY PLAN:  Per facility protocol. 5. COMMUNITY RESOURCES COORDINATION/ HEALTH CARE NAVIGATION:  None. 6. FINANCIAL/LEGAL CONCERNS/INTERVENTIONS:  None.     SOCIAL HX:  Social History   Tobacco Use  . Smoking status: Former Smoker    Packs/day: 0.50    Years: 20.00    Pack years: 10.00    Last attempt to quit: 09/20/1975    Years since quitting: 43.1  . Smokeless tobacco: Never Used  Substance Use Topics  . Alcohol use: Yes    Comment: a social drink once a week    CODE STATUS:  DNR  ADVANCED DIRECTIVES:  HCPOA and DPOA MOST FORM COMPLETE:  Yes - Comofort care, limited ABT, IV trial and no feeding tube. HOSPICE EDUCATION PROVIDED: No PPS:  Patient's appetite is normal per staff.  He is w/c bound.      Jose Corn Malachi Suderman, [name]

## 2018-11-20 DIAGNOSIS — G35 Multiple sclerosis: Secondary | ICD-10-CM | POA: Diagnosis not present

## 2018-11-20 DIAGNOSIS — R293 Abnormal posture: Secondary | ICD-10-CM | POA: Diagnosis not present

## 2018-11-21 DIAGNOSIS — R293 Abnormal posture: Secondary | ICD-10-CM | POA: Diagnosis not present

## 2018-11-21 DIAGNOSIS — G35 Multiple sclerosis: Secondary | ICD-10-CM | POA: Diagnosis not present

## 2018-11-22 DIAGNOSIS — R293 Abnormal posture: Secondary | ICD-10-CM | POA: Diagnosis not present

## 2018-11-22 DIAGNOSIS — G35 Multiple sclerosis: Secondary | ICD-10-CM | POA: Diagnosis not present

## 2018-11-23 DIAGNOSIS — G35 Multiple sclerosis: Secondary | ICD-10-CM | POA: Diagnosis not present

## 2018-11-23 DIAGNOSIS — R293 Abnormal posture: Secondary | ICD-10-CM | POA: Diagnosis not present

## 2018-11-26 DIAGNOSIS — G35 Multiple sclerosis: Secondary | ICD-10-CM | POA: Diagnosis not present

## 2018-11-26 DIAGNOSIS — R293 Abnormal posture: Secondary | ICD-10-CM | POA: Diagnosis not present

## 2018-11-27 ENCOUNTER — Non-Acute Institutional Stay: Payer: Medicare Other | Admitting: *Deleted

## 2018-11-27 VITALS — BP 113/74 | HR 74 | Temp 97.7°F | Resp 18 | Wt 217.0 lb

## 2018-11-27 DIAGNOSIS — R293 Abnormal posture: Secondary | ICD-10-CM | POA: Diagnosis not present

## 2018-11-27 DIAGNOSIS — Z515 Encounter for palliative care: Secondary | ICD-10-CM

## 2018-11-27 DIAGNOSIS — G35 Multiple sclerosis: Secondary | ICD-10-CM | POA: Diagnosis not present

## 2018-11-28 DIAGNOSIS — R293 Abnormal posture: Secondary | ICD-10-CM | POA: Diagnosis not present

## 2018-11-28 DIAGNOSIS — G35 Multiple sclerosis: Secondary | ICD-10-CM | POA: Diagnosis not present

## 2018-11-28 NOTE — Progress Notes (Signed)
COMMUNITY PALLIATIVE CARE RN NOTE  PATIENT NAME: [name] DOB: Sep 08, 1941 MRN: [id number]  PRIMARY CARE PROVIDER: [name], DO  RESPONSIBLE PARTY:  Acct ID - Guarantor Home Phone Work Phone Relationship Acct Type  [id number] [name]  Self P/F     [address], [name], [place] [address]    PLAN OF CARE and INTERVENTION:  1. ADVANCE CARE PLANNING/GOALS OF CARE: Goal is for patient to remain at current facility. He is a DNR. 2. PATIENT/CAREGIVER EDUCATION: Explained Palliative Care Services 3. DISEASE STATUS: Met with patient at the facility in his room. Hired caregiver also present. He is lying in bed and is awake and alert. He is able to answer questions and follow commands. He denies pain. Staff reports that over the weekend, patient was very combative and uncooperative. Behaviors have been better over the past 2 days. He is currently taking Depakote 250 mg BID and Sertraline 100 mg Q HS. He is pleasant and cooperative throughout visit today. He is total care for all ADLs. He is transferred via a [employer] fast streamer and 2 person assistance. Transferred via wheelchair. He is incontinent of both bowel and bladder and wears adult briefs. His intake is normal. He is currently on a Dysphagia II diet with ground meats w/gravy and small portions. He eats applesauce with meals and Pureed soups. He is on nectar thickened liquids with no straws. High Aspiration risk. He is to wear his chin collar for all meals, but does not particularly like wearing this collar. Speech therapy sessions have ended. He has been working with PT/OT and caregiver states that they did educate her on how to perform patient ROM exercises and this is to be done on each shift. Will continue to monitor.  HISTORY OF PRESENT ILLNESS:  This is a 78 yo male who resides at [facility] on the SNF unit. Palliative Care team continues to follow patient. Will continue to visit monthly and PRN.  CODE STATUS: DNR ADVANCED  DIRECTIVES: Y MOST FORM: yes PPS: 30%   PHYSICAL EXAM:   VITALS: Today's Vitals   11/27/18 1445  BP: 113/74  Pulse: 74  Resp: 18  Temp: 97.7 F (36.5 C)  TempSrc: Temporal  SpO2: 92%  Weight: 217 lb (98.4 kg)  PainSc: 0-No pain    LUNGS: clear to auscultation  CARDIAC: Cor RRR EXTREMITIES: 1+ edema noted to R lower extremity and Trace edema to L lower extremity SKIN: Exposed skin is dry and intact  NEURO: Alert and oriented to person/place, pleasant and engaging, total care w/ ADLs   (Duration of visit and documentation 45 minutes)    Daryl Eastern, RN, BSN

## 2018-11-29 DIAGNOSIS — R293 Abnormal posture: Secondary | ICD-10-CM | POA: Diagnosis not present

## 2018-11-29 DIAGNOSIS — G35 Multiple sclerosis: Secondary | ICD-10-CM | POA: Diagnosis not present

## 2018-11-30 DIAGNOSIS — R293 Abnormal posture: Secondary | ICD-10-CM | POA: Diagnosis not present

## 2018-11-30 DIAGNOSIS — G35 Multiple sclerosis: Secondary | ICD-10-CM | POA: Diagnosis not present

## 2018-12-03 DIAGNOSIS — G35 Multiple sclerosis: Secondary | ICD-10-CM | POA: Diagnosis not present

## 2018-12-03 DIAGNOSIS — R293 Abnormal posture: Secondary | ICD-10-CM | POA: Diagnosis not present

## 2018-12-04 DIAGNOSIS — G35 Multiple sclerosis: Secondary | ICD-10-CM | POA: Diagnosis not present

## 2018-12-04 DIAGNOSIS — R293 Abnormal posture: Secondary | ICD-10-CM | POA: Diagnosis not present

## 2018-12-05 DIAGNOSIS — R293 Abnormal posture: Secondary | ICD-10-CM | POA: Diagnosis not present

## 2018-12-05 DIAGNOSIS — G35 Multiple sclerosis: Secondary | ICD-10-CM | POA: Diagnosis not present

## 2018-12-10 ENCOUNTER — Non-Acute Institutional Stay (SKILLED_NURSING_FACILITY): Payer: Medicare Other | Admitting: Adult Health

## 2018-12-10 DIAGNOSIS — M436 Torticollis: Secondary | ICD-10-CM

## 2018-12-10 DIAGNOSIS — N319 Neuromuscular dysfunction of bladder, unspecified: Secondary | ICD-10-CM

## 2018-12-10 DIAGNOSIS — R531 Weakness: Secondary | ICD-10-CM

## 2018-12-10 DIAGNOSIS — G35 Multiple sclerosis: Secondary | ICD-10-CM | POA: Diagnosis not present

## 2018-12-10 DIAGNOSIS — F02818 Dementia in other diseases classified elsewhere, unspecified severity, with other behavioral disturbance: Secondary | ICD-10-CM

## 2018-12-10 DIAGNOSIS — R1312 Dysphagia, oropharyngeal phase: Secondary | ICD-10-CM | POA: Diagnosis not present

## 2018-12-10 DIAGNOSIS — F0281 Dementia in other diseases classified elsewhere with behavioral disturbance: Secondary | ICD-10-CM | POA: Diagnosis not present

## 2018-12-10 NOTE — Progress Notes (Signed)
Location:  Occupational psychologist of Service:  SNF (31) Provider:   Cindi Carbon, ANP [place] [phone]   [name], DO  Patient Care Team: [name], DO as PCP - General (Geriatric Medicine) [name], MD as Consulting Physician (Neurology) [name], MD as Consulting Physician (Urology) [name], MD as Consulting Physician (General Surgery) [name], MD as Consulting Physician (Orthopedic Surgery) [name], MD as Consulting Physician ([facility]) [name], MD as Consulting Physician (Gastroenterology) [name], DO as Consulting Physician (Geriatric Medicine) Duffy, Creola Corn, LCSW as Social Worker (Licensed Clinical Social Worker)  Extended Emergency Contact Information Primary Emergency Contact: [name] Address: [employer]          [place], [place] [address] [name] of [place] Phone: [phone] Mobile Phone: [phone] Relation: Spouse  Code Status:  DNR Goals of care: Advanced Directive information Advanced Directives 10/09/2018  Does Patient Have a Medical Advance Directive? Yes  Type of Paramedic of [place];Out of facility DNR (pink MOST or yellow form)  Does patient want to make changes to medical advance directive? No - Patient declined  Copy of Tina in Chart? Yes - validated most recent copy scanned in chart (See row information)  Would patient like information on creating a medical advance directive? -  Pre-existing out of facility DNR order (yellow form or pink MOST form) Pink MOST form placed in chart (order not valid for inpatient use);Yellow form placed in chart (order not valid for inpatient use)     Chief Complaint  Patient presents with  . Medical Management of Chronic Issues    HPI:  Pt is a 78 y.o. male seen today for medical management of chronic diseases.   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 He has progressive dysphagia and is on a D2 ground with NTL and tolerating well. No reports of agitation or anger which has been an issue in the past. No reports of cough, fever, or sob. He is wearing a neck brace to support his neck due to weakness and help prevent aspiration. This seems to have helped. He has chronic indwelling foley due to neurogenic bladder.  No acute complaints for the visit are noted.   Functional status: hoyer lift, incontinent of stool.  Past Medical History:  Diagnosis Date  . Adenomatous polyp   . Allergy   . BPH (benign prostatic hyperplasia)   . COPD (chronic obstructive pulmonary disease) ([place])   . Dementia ([place]) 2010  . Elevated PSA 07/31/2014  . Foot drop, right 2008  . 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 Generalized weakness 01/12/2013  . GERD (gastroesophageal reflux disease)   . Hyperlipidemia 07/31/2014  . Multiple sclerosis ([place]) 2008  . Prostatitis, chronic    Past Surgical History:  Procedure Laterality Date  . CATARACT EXTRACTION Left 02/06/12  . CATARACT EXTRACTION Right 2013  . ESOPHAGOGASTRODUODENOSCOPY ENDOSCOPY  04/16/2002   Dr. Earle Gell  . HERNIA REPAIR  1980'2000   3 inguinal hernia repairs on left  . HERNIA REPAIR  06/16/11   right inguinal hernia repair with mesh   . PROSTATE SURGERY  09/29/2008   Dr. Karsten Ro     Allergies  Allergen Reactions  . Aricept [Donepezil Hcl] Other (See Comments)    Reaction:  Unknown   . Dimethyl Fumarate Nausea And Vomiting    Outpatient Encounter  Medications as of 12/10/2018  Medication Sig  . cetirizine (ZYRTEC) 10 MG tablet Take 10 mg by mouth at bedtime.  . divalproex (DEPAKOTE SPRINKLE) 125 MG capsule Take 250 mg by mouth 2 (two) times daily.   Marland Kitchen docusate (COLACE) 50 MG/5ML liquid Take 100 mg by mouth daily.  Marland Kitchen gabapentin (NEURONTIN) 100 MG capsule Take 100 mg by mouth 3 (three) times daily.  Marland Kitchen guaiFENesin-codeine (ROBITUSSIN  AC) 100-10 MG/5ML syrup Take 10 mLs by mouth 4 (four) times daily as needed for cough.  Marland Kitchen ketoconazole (NIZORAL) 2 % shampoo Apply 1 application topically 2 (two) times a week. Pt uses on Tuesday and Friday.  . magnesium hydroxide (MILK OF MAGNESIA) 400 MG/5ML suspension Take 15 mLs by mouth daily as needed for mild constipation or moderate constipation.   . Melatonin 10 MG TABS Take 1 tablet by mouth at bedtime.  . memantine (NAMENDA) 10 MG tablet Take 10 mg by mouth 2 (two) times daily.  . naproxen sodium (ANAPROX) 220 MG tablet Take 220 mg by mouth every 8 (eight) hours as needed (for discomfort).   . Olopatadine HCl (PATADAY) 0.2 % SOLN Apply 1 drop to eye daily.  Marland Kitchen omeprazole (PRILOSEC) 20 MG capsule Take 20 mg daily by mouth.  . polyethylene glycol (MIRALAX / GLYCOLAX) packet Take 17 g by mouth every other day.   . sertraline (ZOLOFT) 100 MG tablet Take 100 mg by mouth at bedtime.  Marland Kitchen acetaminophen (TYLENOL) 650 MG suppository Place 650 mg rectally every 6 (six) hours as needed for fever.  . ciclopirox (LOPROX) 0.77 % cream Apply 1 application topically 2 (two) times daily. Pt applies to face.  . [DISCONTINUED] solifenacin (VESICARE) 10 MG tablet Take 10 mg by mouth daily.   No facility-administered encounter medications on file as of 12/10/2018.     Review of Systems  Constitutional: Negative for activity change, appetite change, chills, diaphoresis, fatigue, fever and unexpected weight change.  Respiratory: Negative for cough, shortness of breath, wheezing and stridor.   Cardiovascular: Negative for chest pain, palpitations and leg swelling.  Gastrointestinal: Negative for abdominal distention, abdominal pain, constipation and diarrhea.  Genitourinary: Negative for difficulty urinating and dysuria.  Musculoskeletal: Positive for gait problem. Negative for arthralgias, back pain, joint swelling and myalgias.  Skin: Negative for wound.  Neurological: Positive for weakness. Negative for  dizziness, seizures, syncope, facial asymmetry, speech difficulty and headaches.  Hematological: Negative for adenopathy. Does not bruise/bleed easily.  Psychiatric/Behavioral: Positive for behavioral problems and confusion. Negative for agitation.    Immunization History  Administered Date(s) Administered  . Influenza Inj Mdck Quad Pf 07/07/2016  . Influenza,inj,Quad PF,6+ Mos 07/10/2018  . Influenza-Unspecified 07/02/2015, 07/13/2017  . Pneumococcal Conjugate-13 02/10/2015  . Pneumococcal Polysaccharide-23 10/23/2006  . Tdap 04/19/2005, 10/25/2016   Pertinent  Health Maintenance Due  Topic Date Due  . INFLUENZA VACCINE  Completed  . PNA vac Low Risk Adult  Completed   Fall Risk  01/10/2018 01/04/2017 07/04/2016 02/17/2016 08/23/2015  Falls in the past year? No No Yes No Yes  Number falls in past yr: - - 2 or more - 1  Injury with Fall? - - Yes - Yes  Comment - - - - -  Risk Factor Category  - - High Fall Risk - High Fall Risk  Risk for fall due to : - - History of fall(s);Impaired balance/gait - History of fall(s);Impaired balance/gait;Impaired mobility;Medication side effect;Mental status change  Follow up - - Falls evaluation completed - Falls evaluation completed;Education provided;Falls prevention  discussed   Functional Status Survey:    Vitals:   12/10/18 1057  Weight: 218 lb 6.4 oz (99.1 kg)   Body mass index is 28.81 kg/m. Physical Exam Vitals signs and nursing note reviewed.  Constitutional:      General: He is not in acute distress.    Appearance: He is not diaphoretic.  HENT:     Head: Normocephalic and atraumatic.  Neck:     Musculoskeletal: Normal range of motion and neck supple.     Thyroid: No thyromegaly.     Vascular: No JVD.     Trachea: No tracheal deviation.  Cardiovascular:     Rate and Rhythm: Normal rate and regular rhythm.     Heart sounds: No murmur.  Pulmonary:     Effort: Pulmonary effort is normal. No respiratory distress.     Breath  sounds: Normal breath sounds. No wheezing.  Abdominal:     General: Bowel sounds are normal. There is no distension.     Palpations: Abdomen is soft.     Tenderness: There is no abdominal tenderness.  Musculoskeletal:     Right lower leg: Edema present.     Comments: Neck with loss of muscle tone and strength.   Lymphadenopathy:     Cervical: No cervical adenopathy.  Skin:    General: Skin is warm and dry.  Neurological:     Mental Status: He is alert.     Comments: Right sided weakness to RUE and RLE.      Labs reviewed: Recent Labs    03/19/18 06/27/18  NA 139 141  K 4.3 4.4  BUN 17 18  CREATININE 0.7 0.7   No results for input(s): AST, ALT, ALKPHOS, BILITOT, PROT, ALBUMIN in the last 8760 hours. Recent Labs    03/19/18 06/27/18  WBC 7.1 6.5  HGB 14.9 14.7  HCT 44 43  PLT 172 182   Lab Results  Component Value Date   TSH 2.56 08/22/2017   Lab Results  Component Value Date   HGBA1C 5.7 (H) 01/13/2013   Lab Results  Component Value Date   CHOL 193 08/17/2016   HDL 46 08/17/2016   LDLCALC 133 08/17/2016   TRIG 86 08/17/2016   CHOLHDL 3.9 05/13/2014    Significant Diagnostic Results in last 30 days:  No results found.  Assessment/Plan  1. Oropharyngeal dysphagia No new episodes, continue [place] collar and modified diet with asp prec  2. Multiple sclerosis (HCC) Severe with right sided weakness and non ambulatory status, no longer a candidate for medications  3. Right sided weakness Due to #2, wears brace to his RLE   4. Contracture of neck Continue brace for support and to prevent asp  5. Dementia associated with other underlying disease with behavioral disturbance ([place]) Behaviors have improved over the past month, would continue current regimen of depakote   6. Neurogenic bladder No reports of pain or discomfort, continue neurontin Continue catheter care provided by [facility]   Family/ staff Communication: discussed with staff   Labs/tests ordered:  Dep level, LFTs, CBC need to bed done when restrictions are lifted at [facility]

## 2018-12-11 ENCOUNTER — Encounter: Payer: Self-pay | Admitting: Adult Health

## 2018-12-11 DIAGNOSIS — G35 Multiple sclerosis: Secondary | ICD-10-CM | POA: Diagnosis not present

## 2018-12-11 DIAGNOSIS — R293 Abnormal posture: Secondary | ICD-10-CM | POA: Diagnosis not present

## 2018-12-12 DIAGNOSIS — R293 Abnormal posture: Secondary | ICD-10-CM | POA: Diagnosis not present

## 2018-12-12 DIAGNOSIS — G35 Multiple sclerosis: Secondary | ICD-10-CM | POA: Diagnosis not present

## 2018-12-13 DIAGNOSIS — R293 Abnormal posture: Secondary | ICD-10-CM | POA: Diagnosis not present

## 2018-12-13 DIAGNOSIS — G35 Multiple sclerosis: Secondary | ICD-10-CM | POA: Diagnosis not present

## 2018-12-31 ENCOUNTER — Encounter: Payer: Self-pay | Admitting: Adult Health

## 2018-12-31 ENCOUNTER — Non-Acute Institutional Stay (SKILLED_NURSING_FACILITY): Payer: Medicare Other | Admitting: Adult Health

## 2018-12-31 ENCOUNTER — Other Ambulatory Visit: Payer: Self-pay

## 2018-12-31 DIAGNOSIS — R112 Nausea with vomiting, unspecified: Secondary | ICD-10-CM

## 2018-12-31 DIAGNOSIS — R059 Cough, unspecified: Secondary | ICD-10-CM

## 2018-12-31 DIAGNOSIS — R05 Cough: Secondary | ICD-10-CM

## 2018-12-31 DIAGNOSIS — R1312 Dysphagia, oropharyngeal phase: Secondary | ICD-10-CM | POA: Diagnosis not present

## 2018-12-31 NOTE — Progress Notes (Signed)
Location:  Occup[facility] psychologist of Service:  SNF (31) Provider:   Cindi Carbon, ANP [place] [phone]   [name], DO  Patient Care Team: [name], DO as PCP - General (Geriatric Medicine) [name], MD as Consulting Physician (Neurology) [name], MD as Consulting Physician (Urology) [name], MD as Consulting Physician (General Surgery) [name], MD as Consulting Physician (Orthopedic Surgery) [name], MD as Consulting Physician ([facility]) [name], MD as Consulting Physician (Gastroenterology) [name], DO as Consulting Physician (Geriatric Medicine) Duffy, Creola Corn, LCSW as Social Worker (Licensed Clinical Social Worker)  Extended Emergency Contact Information Primary Emergency Contact: [name] Address: [address]          [place], [place] [address] [name] of [place] Phone: [phone] Mobile Phone: [phone] Relation: Spouse  Code Status:  DNR Goals of care: Advanced Directive information Advanced Directives 10/09/2018  Does Patient Have a Medical Advance Directive? Yes  Type of Paramedic of [place];Out of facility DNR (pink MOST or yellow form)  Does patient want to make changes to medical advance directive? No - Patient declined  Copy of [place] in Chart? Yes - validated most recent copy scanned in chart (See row information)  Would patient like information on creating a medical advance directive? -  Pre-existing out of facility DNR order (yellow form or pink MOST form) Pink MOST form placed in chart (order not valid for inpatient use);Yellow form placed in chart (order not valid for inpatient use)     Chief Complaint  Patient presents with   Acute Visit    cough, vomiting    HPI:  Pt is a 78 y.o. male seen today for an acute visit for cough and vomiting. He has a hx of severe MS and  is non ambulatory with generalized weakness R>L.  He has progressive dementia and dysphagia as well. He has a chronic cough due to this and is on a modified diet D2 with NTL. He is a DNR with no hospitalizations per most form. His family would like treatment for aspirations pneumonias as they arise. We have tried to recommend hospice in the past but there was not a consensus among his loved ones. He developed nausea and coughed aggressively around 2pm today x 1. At that time he vomited. Sats were 89% initially during this spell but came up to 93% afterward. No fever, no abd pain, no diarrhea. Last BM 4/12, going regularly. Initially his nurse felt that he was short of breath but later this subsided after the episode was over. He has not had any known sick contacts and has not traveled. He is known to have recurrent aspiration events.    Past Medical History:  Diagnosis Date   Adenomatous polyp    Allergy    BPH (benign prostatic hyperplasia)    COPD (chronic obstructive pulmonary disease) ([place])    Dementia ([place]) 2010   Elevated PSA 07/31/2014   Foot drop, right 2008   Gait disorder 07/31/2009   Generalized weakness 01/12/2013   GERD (gastroesophageal reflux disease)    Hyperlipidemia 07/31/2014   Multiple sclerosis ([place]) 2008   Prostatitis, chronic    Past Surgical History:  Procedure Laterality Date   CATARACT EXTRACTION Left 02/06/12   CATARACT EXTRACTION Right 2013   ESOPHAGOGASTRODUODENOSCOPY ENDOSCOPY  04/16/2002   Dr. Earle Gell   HERNIA REPAIR  [id number]   3 inguinal hernia  repairs on left   HERNIA REPAIR  06/16/11   right inguinal hernia repair with mesh    PROSTATE SURGERY  09/29/2008   Dr. Karsten Ro     Allergies  Allergen Reactions   Aricept [place] Hcl] Other (See Comments)    Reaction:  Unknown    Dimethyl Fumarate Nausea And Vomiting    Outpatient Encounter Medications as of 12/31/2018  Medication Sig   acetaminophen (TYLENOL) 650 MG  suppository Place 650 mg rectally every 6 (six) hours as needed for fever.   cetirizine (ZYRTEC) 10 MG tablet Take 10 mg by mouth at bedtime.   ciclopirox (LOPROX) 0.77 % cream Apply 1 application topically 2 (two) times daily. Pt applies to face.   divalproex (DEPAKOTE SPRINKLE) 125 MG capsule Take 250 mg by mouth 2 (two) times daily.    docusate (COLACE) 50 MG/5ML liquid Take 100 mg by mouth daily.   gabapentin (NEURONTIN) 100 MG capsule Take 100 mg by mouth 3 (three) times daily.   guaiFENesin-codeine (ROBITUSSIN AC) 100-10 MG/5ML syrup Take 10 mLs by mouth 4 (four) times daily as needed for cough.   ketoconazole (NIZORAL) 2 % shampoo Apply 1 application topically 2 (two) times a week. Pt uses on Tuesday and Friday.   magnesium hydroxide (MILK OF MAGNESIA) 400 MG/5ML suspension Take 15 mLs by mouth daily as needed for mild constipation or moderate constipation.    Melatonin 10 MG TABS Take 1 tablet by mouth at bedtime.   memantine (NAMENDA) 10 MG tablet Take 10 mg by mouth 2 (two) times daily.   naproxen sodium (ANAPROX) 220 MG tablet Take 220 mg by mouth every 8 (eight) hours as needed (for discomfort).    Olopatadine HCl (PATADAY) 0.2 % SOLN Apply 1 drop to eye daily.   omeprazole (PRILOSEC) 20 MG capsule Take 20 mg daily by mouth.   polyethylene glycol (MIRALAX / GLYCOLAX) packet Take 17 g by mouth every other day.    sertraline (ZOLOFT) 100 MG tablet Take 100 mg by mouth at bedtime.   No facility-administered encounter medications on file as of 12/31/2018.     Review of Systems  Unable to perform ROS: Dementia    Immunization History  Administered Date(s) Administered   Influenza Inj Mdck Quad Pf 07/07/2016   Influenza,inj,Quad PF,6+ Mos 07/10/2018   Influenza-Unspecified 07/02/2015, 07/13/2017   Pneumococcal Conjugate-13 02/10/2015   Pneumococcal Polysaccharide-23 10/23/2006   Tdap 04/19/2005, 10/25/2016   Pertinent  Health Maintenance Due  Topic Date Due    INFLUENZA VACCINE  04/20/2019   PNA vac Low Risk Adult  Completed   Fall Risk  01/10/2018 01/04/2017 07/04/2016 02/17/2016 08/23/2015  Falls in the past year? No No Yes No Yes  Number falls in past yr: - - 2 or more - 1  Injury with Fall? - - Yes - Yes  Comment - - - - -  Risk Factor Category  - - High Fall Risk - High Fall Risk  Risk for fall due to : - - History of fall(s);Impaired balance/gait - History of fall(s);Impaired balance/gait;Impaired mobility;Medication side effect;Mental status change  Follow up - - Falls evaluation completed - Falls evaluation completed;Education provided;Falls prevention discussed   Functional Status Survey:    Vitals:   12/31/18 1511  BP: (!) 148/92  Pulse: 78  Resp: (!) 22  Temp: (!) 97.1 F (36.2 C)  SpO2: 93%   There is no height or weight on file to calculate BMI. Physical Exam Vitals signs and nursing note reviewed.  Constitutional:  General: He is not in acute distress.    Appearance: Normal appearance. He is not toxic-appearing.  HENT:     Head: Normocephalic and atraumatic.     Nose: Rhinorrhea present.     Mouth/Throat:     Mouth: Mucous membranes are moist.     Pharynx: No oropharyngeal exudate or posterior oropharyngeal erythema.  Eyes:     General:        Right eye: No discharge.        Left eye: No discharge.     Conjunctiva/sclera: Conjunctivae normal.     Pupils: Pupils are equal, round, and reactive to light.  Cardiovascular:     Rate and Rhythm: Normal rate and regular rhythm.     Heart sounds: No murmur.  Pulmonary:     Effort: Pulmonary effort is normal. No respiratory distress.     Breath sounds: Normal breath sounds. No wheezing.  Abdominal:     General: Bowel sounds are normal. There is distension (mild chronically rotund).     Palpations: Abdomen is soft.     Tenderness: There is no abdominal tenderness. There is no right CVA tenderness, left CVA tenderness or guarding.  Musculoskeletal:     Right  lower leg: Edema (chronic) present.     Left lower leg: No edema.  Lymphadenopathy:     Cervical: No cervical adenopathy.  Skin:    General: Skin is warm and dry.     Comments: Left chest area below nipple with SK that has been torn from his skin and bleeding. Nurse to apply dry dressing. No drainage, erythema.  Neurological:     Mental Status: He is alert. Mental status is at baseline.     Comments: Oriented to self and place. Pleasant and able to f/c. Right sided weakness chronic.  Psychiatric:        Mood and Affect: Mood normal.     Labs reviewed: Recent Labs    03/19/18 06/27/18  NA 139 141  K 4.3 4.4  BUN 17 18  CREATININE 0.7 0.7   No results for input(s): AST, ALT, ALKPHOS, BILITOT, PROT, ALBUMIN in the last 8760 hours. Recent Labs    03/19/18 06/27/18  WBC 7.1 6.5  HGB 14.9 14.7  HCT 44 43  PLT 172 182   Lab Results  Component Value Date   TSH 2.56 08/22/2017   Lab Results  Component Value Date   HGBA1C 5.7 (H) 01/13/2013   Lab Results  Component Value Date   CHOL 193 08/17/2016   HDL 46 08/17/2016   LDLCALC 133 08/17/2016   TRIG 86 08/17/2016   CHOLHDL 3.9 05/13/2014    Significant Diagnostic Results in last 30 days:  No results found.  Assessment/Plan 1. Cough Chronic due to hx of progressively worsening dysphagia. No fever or sob. Sats normalized. Possibly he had a coughing event that led to vomiting (just started this afternoon x1 )  Maintain droplet precautions at this time and monitor pt.  2. Non-intractable vomiting with nausea, unspecified vomiting type No signs of acute abd concerns. Zofran 4 mg q 8 hrs prn n/v.  Monitor VS qshift x 72 hrs. If symptoms continue notify oncall [place] for orders for labs, xrays.   3. Oropharyngeal dysphagia Continue asp prec, neck collar for support, D2 diet with NTL.  No hospitalizations or feeding tubes.       Family/ staff Communication: staff to communicate with his wife.   Labs/tests ordered:  NA

## 2019-01-08 ENCOUNTER — Non-Acute Institutional Stay (SKILLED_NURSING_FACILITY): Payer: Medicare Other | Admitting: Internal Medicine

## 2019-01-08 ENCOUNTER — Encounter: Payer: Self-pay | Admitting: Internal Medicine

## 2019-01-08 DIAGNOSIS — R1312 Dysphagia, oropharyngeal phase: Secondary | ICD-10-CM

## 2019-01-08 DIAGNOSIS — M436 Torticollis: Secondary | ICD-10-CM

## 2019-01-08 DIAGNOSIS — Z9359 Other cystostomy status: Secondary | ICD-10-CM

## 2019-01-08 DIAGNOSIS — F0281 Dementia in other diseases classified elsewhere with behavioral disturbance: Secondary | ICD-10-CM

## 2019-01-08 DIAGNOSIS — G35 Multiple sclerosis: Secondary | ICD-10-CM | POA: Diagnosis not present

## 2019-01-08 DIAGNOSIS — N319 Neuromuscular dysfunction of bladder, unspecified: Secondary | ICD-10-CM | POA: Diagnosis not present

## 2019-01-08 DIAGNOSIS — F02818 Dementia in other diseases classified elsewhere, unspecified severity, with other behavioral disturbance: Secondary | ICD-10-CM

## 2019-01-08 NOTE — Progress Notes (Signed)
Patient ID: [name], male   DOB: 02-03-1941, 78 y.o.   MRN: [id number]  Location:  [facility] Room Number: 129 Place of Service:  SNF [phone]) Provider:   [name], DO  Patient Care Team: [name], DO as PCP - General (Geriatric Medicine) [name], MD as Consulting Physician (Neurology) [name], MD as Consulting Physician (Urology) [name], MD as Consulting Physician (General Surgery) [name], MD as Consulting Physician (Orthopedic Surgery) [name], MD as Consulting Physician ([facility]) [name], MD as Consulting Physician (Gastroenterology) [name], DO as Consulting Physician (Geriatric Medicine) Duffy, Creola Corn, LCSW as Social Worker (Licensed Clinical Social Worker)  Extended Emergency Contact Information Primary Emergency Contact: [name] Address: [address]          [place], [place] [address] [name] of [place] Phone: [phone] Mobile Phone: [phone] Relation: Spouse  Code Status:  DNR, MOST, palliative care Goals of care: Advanced Directive information Advanced Directives 10/09/2018  Does Patient Have a Medical Advance Directive? Yes  Type of Paramedic of [place];Out of facility DNR (pink MOST or yellow form)  Does patient want to make changes to medical advance directive? No - Patient declined  Copy of [place] in Chart? Yes - validated most recent copy scanned in chart (See row information)  Would patient like information on creating a medical advance directive? -  Pre-existing out of facility DNR order (yellow form or pink MOST form) Pink MOST form placed in chart (order not valid for inpatient use);Yellow form placed in chart (order not valid for inpatient use)     Chief Complaint  Patient presents with  . Medical Management of Chronic Issues    Routine vist    HPI:  Pt is a 78  y.o. male seen today for medical management of chronic diseases.  He is in SNF for long-term care due to multiple sclerosis and related dementia.  He has a chronic suprapubic catheter for his neurogenic bladder.  He is receiving palliative care services.  Nursing reports he was yelling "help help" over the weekend when in the room alone at all.  He had one coughing episode at breakfast this morning, but no further problems since.  MMSE was done which now showed a score of 9/30.  Resident kept cussing, "shit" when he could not answer the questions.    Past Medical History:  Diagnosis Date  . Adenomatous polyp   . Allergy   . BPH (benign prostatic hyperplasia)   . COPD (chronic obstructive pulmonary disease) ([place])   . Dementia ([place]) 2010  . Elevated PSA 07/31/2014  . Foot drop, right 2008  . 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 Generalized weakness 01/12/2013  . GERD (gastroesophageal reflux disease)   . Hyperlipidemia 07/31/2014  . Multiple sclerosis ([place]) 2008  . Prostatitis, chronic    Past Surgical History:  Procedure Laterality Date  . CATARACT EXTRACTION Left 02/06/12  . CATARACT EXTRACTION Right 2013  . ESOPHAGOGASTRODUODENOSCOPY ENDOSCOPY  04/16/2002   Dr. Earle Gell  . HERNIA REPAIR  1980'[id number]   3 inguinal hernia repairs on left  . HERNIA REPAIR  06/16/11   right inguinal hernia repair with mesh   . PROSTATE SURGERY  09/29/2008   Dr. Karsten Ro     Allergies  Allergen Reactions  . Aricept [Donepezil Hcl] Other (See Comments)    Reaction:  Unknown   . Dimethyl Fumarate Nausea And Vomiting  Outpatient Encounter Medications as of 01/08/2019  Medication Sig  . cetirizine (ZYRTEC) 10 MG tablet Take 10 mg by mouth at bedtime.  . ciclopirox (LOPROX) 0.77 % cream Apply 1 application topically 2 (two) times daily. Pt applies to face.  . divalproex (DEPAKOTE SPRINKLE) 125 MG capsule Take 250 mg by mouth 2 (two) times daily.   Marland Kitchen docusate (COLACE) 50 MG/5ML liquid Take 100 mg by mouth  daily.  Marland Kitchen gabapentin (NEURONTIN) 100 MG capsule Take 100 mg by mouth 3 (three) times daily.  Marland Kitchen ketoconazole (NIZORAL) 2 % shampoo Apply 1 application topically 2 (two) times a week. Pt uses on Tuesday and Friday.  . magnesium hydroxide (MILK OF MAGNESIA) 400 MG/5ML suspension Take 15 mLs by mouth daily as needed for mild constipation or moderate constipation.   . Melatonin 10 MG TABS Take 1 tablet by mouth at bedtime.  . memantine (NAMENDA) 10 MG tablet Take 10 mg by mouth 2 (two) times daily.  . naproxen sodium (ANAPROX) 220 MG tablet Take 220 mg by mouth every 8 (eight) hours as needed (for discomfort).   . Olopatadine HCl (PATADAY) 0.2 % SOLN Apply 1 drop to eye daily.  Marland Kitchen omeprazole (PRILOSEC) 20 MG capsule Take 20 mg daily by mouth.  . polyethylene glycol (MIRALAX / GLYCOLAX) packet Take 17 g by mouth every other day.   . sertraline (ZOLOFT) 100 MG tablet Take 100 mg by mouth at bedtime.  . [DISCONTINUED] acetaminophen (TYLENOL) 650 MG suppository Place 650 mg rectally every 6 (six) hours as needed for fever.  . [DISCONTINUED] guaiFENesin-codeine (ROBITUSSIN AC) 100-10 MG/5ML syrup Take 10 mLs by mouth 4 (four) times daily as needed for cough.   No facility-administered encounter medications on file as of 01/08/2019.     Review of Systems  Constitutional: Positive for fatigue. Negative for activity change, appetite change, chills, fever and unexpected weight change.  HENT: Negative for congestion.   Eyes: Negative for visual disturbance.  Respiratory: Negative for cough, shortness of breath and wheezing.   Cardiovascular: Negative for chest pain, palpitations and leg swelling.  Gastrointestinal: Negative for abdominal pain, blood in stool, constipation, diarrhea, nausea and vomiting.  Genitourinary: Negative for dysuria.       Suprapubic catheter in place  Musculoskeletal: Positive for gait problem.  Skin: Negative for color change.  Neurological: Positive for weakness. Negative for  dizziness and speech difficulty.  Hematological: Does not bruise/bleed easily.  Psychiatric/Behavioral: Positive for confusion. Negative for agitation, hallucinations and sleep disturbance.    Immunization History  Administered Date(s) Administered  . Influenza Inj Mdck Quad Pf 07/07/2016  . Influenza,inj,Quad PF,6+ Mos 07/10/2018  . Influenza-Unspecified 07/02/2015, 07/13/2017  . Pneumococcal Conjugate-13 02/10/2015  . Pneumococcal Polysaccharide-23 10/23/2006  . Tdap 04/19/2005, 10/25/2016   Pertinent  Health Maintenance Due  Topic Date Due  . INFLUENZA VACCINE  04/20/2019  . PNA vac Low Risk Adult  Completed   Fall Risk  01/10/2018 01/04/2017 07/04/2016 02/17/2016 08/23/2015  Falls in the past year? No No Yes No Yes  Number falls in past yr: - - 2 or more - 1  Injury with Fall? - - Yes - Yes  Comment - - - - -  Risk Factor Category  - - High Fall Risk - High Fall Risk  Risk for fall due to : - - History of fall(s);Impaired balance/gait - History of fall(s);Impaired balance/gait;Impaired mobility;Medication side effect;Mental status change  Follow up - - Falls evaluation completed - Falls evaluation completed;Education provided;Falls prevention discussed  Functional Status Survey:    Vitals:   01/08/19 1023  BP: 115/75  Pulse: 61  Resp: 18  Temp: (!) 97.3 F (36.3 C)  TempSrc: Oral  SpO2: 93%  Weight: 216 lb (98 kg)  Height: 6\' 1"  (1.854 m)   Body mass index is 28.5 kg/m.exam limited due to social distancing for covid-19 pandemic Physical Exam Vitals signs and nursing note reviewed.  Constitutional:      General: He is not in acute distress.    Appearance: He is normal weight. He is not toxic-appearing.  Neck:     Comments: Supportive brace in place, resident was being fed his lunch Pulmonary:     Effort: Pulmonary effort is normal.  Abdominal:     General: There is no distension.  Musculoskeletal:     Comments: See below  Skin:    General: Skin is warm and  dry.     Capillary Refill: Capillary refill takes less than 2 seconds.  Neurological:     Mental Status: He is alert.     Comments: Foot drop; poor postural support--requires high-backed wheelchair and neck brace  Psychiatric:        Mood and Affect: Mood normal.     Labs reviewed: Recent Labs    03/19/18 06/27/18  NA 139 141  K 4.3 4.4  BUN 17 18  CREATININE 0.7 0.7   No results for input(s): AST, ALT, ALKPHOS, BILITOT, PROT, ALBUMIN in the last 8760 hours. Recent Labs    03/19/18 06/27/18  WBC 7.1 6.5  HGB 14.9 14.7  HCT 44 43  PLT 172 182   Lab Results  Component Value Date   TSH 2.56 08/22/2017   Lab Results  Component Value Date   HGBA1C 5.7 (H) 01/13/2013   Lab Results  Component Value Date   CHOL 193 08/17/2016   HDL 46 08/17/2016   LDLCALC 133 08/17/2016   TRIG 86 08/17/2016   CHOLHDL 3.9 05/13/2014    Significant Diagnostic Results in last 30 days:  No results found.  Assessment/Plan 1. Suprapubic catheter ([place]) -remains in place, cont this with regular flushes and changes per facility protocol  2. Multiple sclerosis (H[facility]) -no noted exacerbations or obvious changes recently  3. Oropharyngeal dysphagia -ongoing, cont modified diet and aspiration precautions, feeding patient when upright  4. Dementia associated with other underlying disease with behavioral disturbance ([place]) -continues to have some verbal behaviors related to this but mostly "help", some cussing, but not as much inappropriate sexual comments or prejudicial remarks  5. Contracture of neck -cont supportive brace per OT  6. Neurogenic bladder -cont suprapubic catheter, no recent problems here  Family/ staff Communication: discussed with snf nurse  Labs/tests ordered:  No new  Kabeer Hoagland L. Monica Codd, D.O. [facility] [address], [place] [address] Cell Phone (Mon-Fri 8am-5pm):  [phone] On Call:  [phone] & follow  prompts after 5pm & weekends Office Phone:  [phone] Office Fax:  [phone]

## 2019-01-14 ENCOUNTER — Non-Acute Institutional Stay (SKILLED_NURSING_FACILITY): Payer: Medicare Other | Admitting: Adult Health

## 2019-01-14 ENCOUNTER — Encounter: Payer: Self-pay | Admitting: Adult Health

## 2019-01-14 ENCOUNTER — Other Ambulatory Visit: Payer: Self-pay

## 2019-01-14 DIAGNOSIS — M7989 Other specified soft tissue disorders: Secondary | ICD-10-CM

## 2019-01-14 NOTE — Progress Notes (Signed)
Location:  Occupational psychologist of Service:  SNF (31) Provider:   Cindi Carbon, ANP [place] [phone]   [name], DO  Patient Care Team: [name], DO as PCP - General (Geriatric Medicine) [name], MD as Consulting Physician (Neurology) [name], MD as Consulting Physician (Urology) [name], MD as Consulting Physician (General Surgery) [name], MD as Consulting Physician (Orthopedic Surgery) [name], MD as Consulting Physician ([facility]) [name], MD as Consulting Physician (Gastroenterology) [name], DO as Consulting Physician (Geriatric Medicine) Duffy, Creola Corn, LCSW as Social Worker (Licensed Clinical Social Worker)  Extended Emergency Contact Information Primary Emergency Contact: [name] Address: [address]          [place], [place] [address] [name] of [place] Phone: [phone] Mobile Phone: [phone] Relation: Spouse  Code Status:  DNR Goals of care: Advanced Directive information Advanced Directives 10/09/2018  Does Patient Have a Medical Advance Directive? Yes  Type of Paramedic of [place];Out of facility DNR (pink MOST or yellow form)  Does patient want to make changes to medical advance directive? No - Patient declined  Copy of [place] in Chart? Yes - validated most recent copy scanned in chart (See row information)  Would patient like information on creating a medical advance directive? -  Pre-existing out of facility DNR order (yellow form or pink MOST form) Pink MOST form placed in chart (order not valid for inpatient use);Yellow form placed in chart (order not valid for inpatient use)     Chief Complaint  Patient presents with  . Acute Visit    left thigh swelling    HPI:  Pt is a 78 y.o. male seen today for an acute visit for left thigh swelling. His nurse reported that  she noticed the swelling to the left thigh area today. There is no associated warmth, redness, wound, fever, etc. He initially denied pain but did experience some tenderness upon examination by the nurse supervisor at the left knee/low thigh area. He is not short of breath, denies chest pain, and 02 sats are with in normal limits. He is non ambulatory due to MS.     Past Medical History:  Diagnosis Date  . Adenomatous polyp   . Allergy   . BPH (benign prostatic hyperplasia)   . COPD (chronic obstructive pulmonary disease) ([place])   . Dementia ([place]) 2010  . Elevated PSA 07/31/2014  . Foot drop, right 2008  . 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 Generalized weakness 01/12/2013  . GERD (gastroesophageal reflux disease)   . Hyperlipidemia 07/31/2014  . Multiple sclerosis ([place]) 2008  . Prostatitis, chronic    Past Surgical History:  Procedure Laterality Date  . CATARACT EXTRACTION Left 02/06/12  . CATARACT EXTRACTION Right 2013  . ESOPHAGOGASTRODUODENOSCOPY ENDOSCOPY  04/16/2002   Dr. Earle Gell  . HERNIA REPAIR  1980'2000   3 inguinal hernia repairs on left  . HERNIA REPAIR  06/16/11   right inguinal hernia repair with mesh   . PROSTATE SURGERY  09/29/2008   Dr. Karsten Ro     Allergies  Allergen Reactions  . Aricept [Donepezil Hcl] Other (See Comments)    Reaction:  Unknown   . Dimethyl Fumarate Nausea And Vomiting    Outpatient Encounter Medications as of 01/14/2019  Medication Sig  . cetirizine (ZYRTEC) 10 MG tablet Take 10 mg by mouth at bedtime.  . ciclopirox (LOPROX) 0.77 % cream  Apply 1 application topically 2 (two) times daily. Pt applies to face.  . divalproex (DEPAKOTE SPRINKLE) 125 MG capsule Take 250 mg by mouth 2 (two) times daily.   Marland Kitchen docusate (COLACE) 50 MG/5ML liquid Take 100 mg by mouth daily.  Marland Kitchen gabapentin (NEURONTIN) 100 MG capsule Take 100 mg by mouth 3 (three) times daily.  Marland Kitchen ketoconazole (NIZORAL) 2 % shampoo Apply 1 application topically 2 (two) times a week. Pt uses  on Tuesday and Friday.  . magnesium hydroxide (MILK OF MAGNESIA) 400 MG/5ML suspension Take 15 mLs by mouth daily as needed for mild constipation or moderate constipation.   . Melatonin 10 MG TABS Take 1 tablet by mouth at bedtime.  . memantine (NAMENDA) 10 MG tablet Take 10 mg by mouth 2 (two) times daily.  . naproxen sodium (ANAPROX) 220 MG tablet Take 220 mg by mouth every 8 (eight) hours as needed (for discomfort).   . Olopatadine HCl (PATADAY) 0.2 % SOLN Apply 1 drop to eye daily.  Marland Kitchen omeprazole (PRILOSEC) 20 MG capsule Take 20 mg daily by mouth.  . polyethylene glycol (MIRALAX / GLYCOLAX) packet Take 17 g by mouth every other day.   . sertraline (ZOLOFT) 100 MG tablet Take 100 mg by mouth at bedtime.   No facility-administered encounter medications on file as of 01/14/2019.     Review of Systems  Constitutional: Positive for fatigue. Negative for activity change, appetite change, chills, diaphoresis, fever and unexpected weight change.  HENT: Negative for congestion.   Respiratory: Positive for cough (chronic due to dysphagia but not present today). Negative for chest tightness, shortness of breath, wheezing and stridor.   Cardiovascular: Positive for leg swelling. Negative for chest pain and palpitations.  Gastrointestinal: Negative for abdominal distention, constipation and diarrhea.  Genitourinary: Negative for difficulty urinating, flank pain and urgency.  Musculoskeletal: Positive for gait problem. Negative for arthralgias and back pain.  Skin: Negative for rash and wound.  Psychiatric/Behavioral: Positive for agitation, behavioral problems and confusion.    Immunization History  Administered Date(s) Administered  . Influenza Inj Mdck Quad Pf 07/07/2016  . Influenza,inj,Quad PF,6+ Mos 07/10/2018  . Influenza-Unspecified 07/02/2015, 07/13/2017  . Pneumococcal Conjugate-13 02/10/2015  . Pneumococcal Polysaccharide-23 10/23/2006  . Tdap 04/19/2005, 10/25/2016   Pertinent   Health Maintenance Due  Topic Date Due  . INFLUENZA VACCINE  04/20/2019  . PNA vac Low Risk Adult  Completed   Fall Risk  01/10/2018 01/04/2017 07/04/2016 02/17/2016 08/23/2015  Falls in the past year? No No Yes No Yes  Number falls in past yr: - - 2 or more - 1  Injury with Fall? - - Yes - Yes  Comment - - - - -  Risk Factor Category  - - High Fall Risk - High Fall Risk  Risk for fall due to : - - History of fall(s);Impaired balance/gait - History of fall(s);Impaired balance/gait;Impaired mobility;Medication side effect;Mental status change  Follow up - - Falls evaluation completed - Falls evaluation completed;Education provided;Falls prevention discussed   Functional Status Survey:    Vitals:   01/14/19 1507  BP: 120/80  Pulse: 68  Resp: 18  Temp: (!) 97.2 F (36.2 C)  SpO2: 96%   There is no height or weight on file to calculate BMI. Physical Exam Vitals signs and nursing note reviewed.  Constitutional:      Appearance: Normal appearance.  HENT:     Mouth/Throat:     Mouth: Mucous membranes are moist.     Pharynx: Oropharynx is clear.  No oropharyngeal exudate.  Cardiovascular:     Rate and Rhythm: Normal rate and regular rhythm.  Pulmonary:     Effort: Pulmonary effort is normal.     Breath sounds: Normal breath sounds. No wheezing or rales.  Abdominal:     General: Bowel sounds are normal. There is no distension.     Palpations: Abdomen is soft.     Tenderness: There is no abdominal tenderness.     Comments: rotund  Musculoskeletal:     Right lower leg: Edema present.     Left lower leg: Edema (not tender, not ptting. L>R. No redness or warmth. neg homans sign. ) present.  Lymphadenopathy:     Cervical: No cervical adenopathy.  Skin:    General: Skin is warm and dry.     Findings: Erythema (small amount of erythema to both knees. ) present.  Neurological:     Mental Status: He is alert. Mental status is at baseline.     Comments: Oriented to self and place but  not time. Right sided weakness chronic.   Psychiatric:        Mood and Affect: Mood normal.     Labs reviewed: Recent Labs    03/19/18 06/27/18  NA 139 141  K 4.3 4.4  BUN 17 18  CREATININE 0.7 0.7   No results for input(s): AST, ALT, ALKPHOS, BILITOT, PROT, ALBUMIN in the last 8760 hours. Recent Labs    03/19/18 06/27/18  WBC 7.1 6.5  HGB 14.9 14.7  HCT 44 43  PLT 172 182   Lab Results  Component Value Date   TSH 2.56 08/22/2017   Lab Results  Component Value Date   HGBA1C 5.7 (H) 01/13/2013   Lab Results  Component Value Date   CHOL 193 08/17/2016   HDL 46 08/17/2016   LDLCALC 133 08/17/2016   TRIG 86 08/17/2016   CHOLHDL 3.9 05/13/2014    Significant Diagnostic Results in last 30 days:  No results found.  Assessment/Plan 1. Left leg swelling BLE venous doppler to rule out DVT No redness, warm, fever, or pain to suggest cellulitis.  Ensure strap to catheter bag is loose and rotate sites regularly. Keep extremity elevated. Monitor VS qshift x 72 hrs    Family/ staff Communication: staff to communicate with his wife Vickii Chafe  Labs/tests ordered:  Venous doppler.

## 2019-01-15 DIAGNOSIS — M7989 Other specified soft tissue disorders: Secondary | ICD-10-CM | POA: Diagnosis not present

## 2019-01-17 ENCOUNTER — Other Ambulatory Visit: Payer: Self-pay

## 2019-01-17 ENCOUNTER — Encounter: Payer: Self-pay | Admitting: Adult Health

## 2019-01-17 ENCOUNTER — Non-Acute Institutional Stay (SKILLED_NURSING_FACILITY): Payer: Medicare Other | Admitting: Adult Health

## 2019-01-17 ENCOUNTER — Telehealth: Payer: Self-pay | Admitting: Licensed Clinical Social Worker

## 2019-01-17 DIAGNOSIS — G4483 Primary cough headache: Secondary | ICD-10-CM | POA: Diagnosis not present

## 2019-01-17 DIAGNOSIS — D649 Anemia, unspecified: Secondary | ICD-10-CM | POA: Diagnosis not present

## 2019-01-17 DIAGNOSIS — I5031 Acute diastolic (congestive) heart failure: Secondary | ICD-10-CM

## 2019-01-17 DIAGNOSIS — R0602 Shortness of breath: Secondary | ICD-10-CM | POA: Diagnosis not present

## 2019-01-17 DIAGNOSIS — R0902 Hypoxemia: Secondary | ICD-10-CM

## 2019-01-17 DIAGNOSIS — R05 Cough: Secondary | ICD-10-CM | POA: Diagnosis not present

## 2019-01-17 DIAGNOSIS — Z20828 Contact with and (suspected) exposure to other viral communicable diseases: Secondary | ICD-10-CM | POA: Diagnosis not present

## 2019-01-17 LAB — BASIC METABOLIC PANEL
BUN: 18 (ref 4–21)
Creatinine: 0.7 (ref 0.6–1.3)
Glucose: 112
Potassium: 4.2 (ref 3.4–5.3)
Sodium: 140 (ref 137–147)

## 2019-01-17 LAB — CBC AND DIFFERENTIAL
HCT: 43 (ref 41–53)
Hemoglobin: 14.5 (ref 13.5–17.5)
Platelets: 166 (ref 150–399)
WBC: 6.8

## 2019-01-17 LAB — HEPATIC FUNCTION PANEL
ALT: 15 (ref 10–40)
AST: 19 (ref 14–40)
Alkaline Phosphatase: 102 (ref 25–125)
Bilirubin, Total: 0.2

## 2019-01-17 NOTE — Progress Notes (Signed)
Location:  Occupational psychologist of Service:  SNF (31) Provider:   Cindi Carbon, ANP [place] [phone]   [name], DO  Patient Care Team: [name], DO as PCP - General (Geriatric Medicine) [name], MD as Consulting Physician (Neurology) [name], MD as Consulting Physician (Urology) [name], MD as Consulting Physician (General Surgery) [name], MD as Consulting Physician (Orthopedic Surgery) [name], MD as Consulting Physician ([facility]) [name], MD as Consulting Physician (Gastroenterology) [name], DO as Consulting Physician (Geriatric Medicine) Duffy, Creola Corn, LCSW as Social Worker (Licensed Clinical Social Worker)  Extended Emergency Contact Information Primary Emergency Contact: [name] Address: [address]          [place], [place] [address] [name] of [place] Phone: [phone] Mobile Phone: [phone] Relation: Spouse  Code Status:  DNR Goals of care: Advanced Directive information Advanced Directives 10/09/2018  Does Patient Have a Medical Advance Directive? Yes  Type of Paramedic of [place];Out of facility DNR (pink MOST or yellow form)  Does patient want to make changes to medical advance directive? No - Patient declined  Copy of [place] in Chart? Yes - validated most recent copy scanned in chart (See row information)  Would patient like information on creating a medical advance directive? -  Pre-existing out of facility DNR order (yellow form or pink MOST form) Pink MOST form placed in chart (order not valid for inpatient use);Yellow form placed in chart (order not valid for inpatient use)     Chief Complaint  Patient presents with  . Acute Visit    weight gain, hypoxia    HPI:  Pt is a 78 y.o. male seen today for an acute visit for hypoxia and weight gain. He has a hx of  severe MS and dementia and resides in skilled care. He is not able to provide a history.  His oxygen levels were checked early this morning and was found to be 85-88%. Oxygen was applied at 2 liters and levels returned to normal. He had no sob, cp, doe, fever, etc. He does have a cough with meals which is chronic due to dysphagia. This has not changed. No sputum production. No GI symptoms.  He had a doppler study to rule out DVT on 4/26 due to edema of the leg leg which was negative. He has gained 14 lbs in the past two weeks. CXR 01/17/2019 showed mild CHF, no infiltrate.  BMP and CBC normal. First covid swab returned negative.  BNP 57.  He has a hx of diastolic CHF noted to be grade 2 on echo in 2014.   Past Medical History:  Diagnosis Date  . Adenomatous polyp   . Allergy   . BPH (benign prostatic hyperplasia)   . COPD (chronic obstructive pulmonary disease) ([place])   . Dementia ([place]) 2010  . Elevated PSA 07/31/2014  . Foot drop, right 2008  . 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 Generalized weakness 01/12/2013  . GERD (gastroesophageal reflux disease)   . Hyperlipidemia 07/31/2014  . Multiple sclerosis ([place]) 2008  . Prostatitis, chronic    Past Surgical History:  Procedure Laterality Date  . CATARACT EXTRACTION Left 02/06/12  . CATARACT EXTRACTION Right 2013  . ESOPHAGOGASTRODUODENOSCOPY ENDOSCOPY  04/16/2002   Dr. Earle Gell  . HERNIA REPAIR  1980'2000   3 inguinal hernia repairs on left  . HERNIA REPAIR  06/16/11   right  inguinal hernia repair with mesh   . PROSTATE SURGERY  09/29/2008   Dr. Karsten Ro     Allergies  Allergen Reactions  . Aricept [Donepezil Hcl] Other (See Comments)    Reaction:  Unknown   . Dimethyl Fumarate Nausea And Vomiting    Outpatient Encounter Medications as of 01/17/2019  Medication Sig  . cetirizine (ZYRTEC) 10 MG tablet Take 10 mg by mouth at bedtime.  . ciclopirox (LOPROX) 0.77 % cream Apply 1 application topically 2 (two) times daily. Pt applies to  face.  . divalproex (DEPAKOTE SPRINKLE) 125 MG capsule Take 250 mg by mouth 2 (two) times daily.   Marland Kitchen docusate (COLACE) 50 MG/5ML liquid Take 100 mg by mouth daily.  Marland Kitchen gabapentin (NEURONTIN) 100 MG capsule Take 100 mg by mouth 3 (three) times daily.  Marland Kitchen ketoconazole (NIZORAL) 2 % shampoo Apply 1 application topically 2 (two) times a week. Pt uses on Tuesday and Friday.  . magnesium hydroxide (MILK OF MAGNESIA) 400 MG/5ML suspension Take 15 mLs by mouth daily as needed for mild constipation or moderate constipation.   . Melatonin 10 MG TABS Take 1 tablet by mouth at bedtime.  . memantine (NAMENDA) 10 MG tablet Take 10 mg by mouth 2 (two) times daily.  . naproxen sodium (ANAPROX) 220 MG tablet Take 220 mg by mouth every 8 (eight) hours as needed (for discomfort).   . Olopatadine HCl (PATADAY) 0.2 % SOLN Apply 1 drop to eye daily.  Marland Kitchen omeprazole (PRILOSEC) 20 MG capsule Take 20 mg daily by mouth.  . polyethylene glycol (MIRALAX / GLYCOLAX) packet Take 17 g by mouth every other day.   . sertraline (ZOLOFT) 100 MG tablet Take 100 mg by mouth at bedtime.   No facility-administered encounter medications on file as of 01/17/2019.     Review of Systems  Unable to perform ROS: Dementia    Immunization History  Administered Date(s) Administered  . Influenza Inj Mdck Quad Pf 07/07/2016  . Influenza,inj,Quad PF,6+ Mos 07/10/2018  . Influenza-Unspecified 07/02/2015, 07/13/2017  . Pneumococcal Conjugate-13 02/10/2015  . Pneumococcal Polysaccharide-23 10/23/2006  . Tdap 04/19/2005, 10/25/2016   Pertinent  Health Maintenance Due  Topic Date Due  . INFLUENZA VACCINE  04/20/2019  . PNA vac Low Risk Adult  Completed   Fall Risk  01/10/2018 01/04/2017 07/04/2016 02/17/2016 08/23/2015  Falls in the past year? No No Yes No Yes  Number falls in past yr: - - 2 or more - 1  Injury with Fall? - - Yes - Yes  Comment - - - - -  Risk Factor Category  - - High Fall Risk - High Fall Risk  Risk for fall due to : - -  History of fall(s);Impaired balance/gait - History of fall(s);Impaired balance/gait;Impaired mobility;Medication side effect;Mental status change  Follow up - - Falls evaluation completed - Falls evaluation completed;Education provided;Falls prevention discussed   Functional Status Survey:    Vitals:   01/17/19 1535  BP: 115/73  Pulse: 68  Resp: 18  Temp: 98.2 F (36.8 C)  SpO2: 94%  Weight: 228 lb 11.2 oz (103.7 kg)   Body mass index is 30.17 kg/m. Physical Exam Vitals signs and nursing note reviewed.  Constitutional:      Appearance: Normal appearance. He is obese.  HENT:     Nose: Nose normal. No congestion.  Eyes:     General:        Right eye: No discharge.        Left eye: No discharge.  Conjunctiva/sclera: Conjunctivae normal.     Pupils: Pupils are equal, round, and reactive to light.  Cardiovascular:     Rate and Rhythm: Normal rate and regular rhythm.     Heart sounds: No murmur.  Pulmonary:     Effort: No respiratory distress.     Breath sounds: Normal breath sounds. No wheezing.  Abdominal:     General: Bowel sounds are normal.     Palpations: Abdomen is soft.     Comments: rotund  Musculoskeletal:     Right lower leg: Edema present.     Left lower leg: Edema present.  Skin:    General: Skin is warm and dry.  Neurological:     Mental Status: He is alert. Mental status is at baseline.     Comments: Oriented to self and place. Right sided weakness.  Psychiatric:        Mood and Affect: Mood normal.     Labs reviewed: Recent Labs    03/19/18 06/27/18 01/17/19  NA 139 141 140  K 4.3 4.4 4.2  BUN 17 18 18   CREATININE 0.7 0.7 0.7   Recent Labs    01/17/19  AST 19  ALT 15  ALKPHOS 102   Recent Labs    03/19/18 06/27/18 01/17/19  WBC 7.1 6.5 6.8  HGB 14.9 14.7 14.5  HCT 44 43 43  PLT 172 182 166   Lab Results  Component Value Date   TSH 2.56 08/22/2017   Lab Results  Component Value Date   HGBA1C 5.7 (H) 01/13/2013   Lab Results   Component Value Date   CHOL 193 08/17/2016   HDL 46 08/17/2016   LDLCALC 133 08/17/2016   TRIG 86 08/17/2016   CHOLHDL 3.9 05/13/2014    Significant Diagnostic Results in last 30 days:  No results found.  Assessment/Plan  1) Acute diastolic CHF Give lasix 20 mg qd for 5 days with Kdur 10 meq for 5 days Monitor weights qod.  2) Hypoxia Resolved First covid test neg, second to be done in 48 hrs Maintain airborne precautions until second test neg.  Continue to monitor oxygen levels and apply oxygen 2 liters [place] as indicated.   Family/ staff Communication: called his wife Vickii Chafe, communicated with staff   Labs/tests ordered:  2nd covid due in 48 hrs

## 2019-01-17 NOTE — Telephone Encounter (Signed)
Palliative Care SW left a vm with patient's wife, Vickii Chafe, to schedule a Telehealth visit.

## 2019-01-19 DIAGNOSIS — Z20828 Contact with and (suspected) exposure to other viral communicable diseases: Secondary | ICD-10-CM | POA: Diagnosis not present

## 2019-02-01 ENCOUNTER — Non-Acute Institutional Stay (SKILLED_NURSING_FACILITY): Payer: Medicare Other | Admitting: Adult Health

## 2019-02-01 ENCOUNTER — Encounter: Payer: Self-pay | Admitting: Adult Health

## 2019-02-01 DIAGNOSIS — I5189 Other ill-defined heart diseases: Secondary | ICD-10-CM

## 2019-02-01 DIAGNOSIS — F0281 Dementia in other diseases classified elsewhere with behavioral disturbance: Secondary | ICD-10-CM | POA: Diagnosis not present

## 2019-02-01 DIAGNOSIS — G35 Multiple sclerosis: Secondary | ICD-10-CM

## 2019-02-01 DIAGNOSIS — M436 Torticollis: Secondary | ICD-10-CM | POA: Diagnosis not present

## 2019-02-01 DIAGNOSIS — R1310 Dysphagia, unspecified: Secondary | ICD-10-CM

## 2019-02-01 DIAGNOSIS — F02818 Dementia in other diseases classified elsewhere, unspecified severity, with other behavioral disturbance: Secondary | ICD-10-CM

## 2019-02-01 DIAGNOSIS — R601 Generalized edema: Secondary | ICD-10-CM | POA: Diagnosis not present

## 2019-02-01 NOTE — Progress Notes (Signed)
Location:  Occupational psychologist of Service:  SNF (31) Provider:   Cindi Carbon, ANP [place] [phone]   [name], DO  Patient Care Team: [name], DO as PCP - General (Geriatric Medicine) [name], MD as Consulting Physician (Neurology) [name], MD as Consulting Physician (Urology) [name], MD as Consulting Physician (General Surgery) [name], MD as Consulting Physician (Orthopedic Surgery) [name], MD as Consulting Physician ([facility]) [name], MD as Consulting Physician (Gastroenterology) [name], DO as Consulting Physician (Geriatric Medicine) Duffy, Creola Corn, LCSW as Social Worker (Licensed Clinical Social Worker)  Extended Emergency Contact Information Primary Emergency Contact: [name] Address: [address]          [place], [place] [address] [name] of [place] Phone: [phone] Mobile Phone: [phone] Relation: Spouse  Code Status:  DNR Goals of care: Advanced Directive information Advanced Directives 10/09/2018  Does Patient Have a Medical Advance Directive? Yes  Type of Paramedic of [place];Out of facility DNR (pink MOST or yellow form)  Does patient want to make changes to medical advance directive? No - Patient declined  Copy of [place] in Chart? Yes - validated most recent copy scanned in chart (See row information)  Would patient like information on creating a medical advance directive? -  Pre-existing out of facility DNR order (yellow form or pink MOST form) Pink MOST form placed in chart (order not valid for inpatient use);Yellow form placed in chart (order not valid for inpatient use)     Chief Complaint  Patient presents with  . Medical Management of Chronic Issues    HPI:  Pt is a 78 y.o. male seen today for medical management of chronic diseases.   He was placed on 5  days of 20 mg of lasix on 4/30 due to an acute 14 lb weight gain and low 02 sats. CXR at that time showed fluid overload but BNP was 57. He only lost 1 lb but is no longer on oxygen and is not short of breath. Continues with edema and difficulty putting on his shoes and pants. He has MS so the caregiver does this for him.  His MS is severe and he is non ambulatory and right sided weakness with a neck contracture. He uses a neck support for this problem as well as a right leg brace and supportive chair.  He is a poor historian due to dementia and so most of the hx is from the chart and the caretaker.   Past Medical History:  Diagnosis Date  . Adenomatous polyp   . Allergy   . BPH (benign prostatic hyperplasia)   . COPD (chronic obstructive pulmonary disease) ([place])   . Dementia ([place]) 2010  . Elevated PSA 07/31/2014  . Foot drop, right 2008  . 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 Generalized weakness 01/12/2013  . GERD (gastroesophageal reflux disease)   . Hyperlipidemia 07/31/2014  . Multiple sclerosis ([place]) 2008  . Prostatitis, chronic    Past Surgical History:  Procedure Laterality Date  . CATARACT EXTRACTION Left 02/06/12  . CATARACT EXTRACTION Right 2013  . ESOPHAGOGASTRODUODENOSCOPY ENDOSCOPY  04/16/2002   Dr. Earle Gell  . HERNIA REPAIR  1980'2000   3 inguinal hernia repairs on left  . HERNIA REPAIR  06/16/11   right inguinal hernia repair with mesh   . PROSTATE SURGERY  09/29/2008   Dr. Karsten Ro  Allergies  Allergen Reactions  . Aricept [Donepezil Hcl] Other (See Comments)    Reaction:  Unknown   . Dimethyl Fumarate Nausea And Vomiting    Outpatient Encounter Medications as of 02/01/2019  Medication Sig  . cetirizine (ZYRTEC) 10 MG tablet Take 10 mg by mouth at bedtime.  . ciclopirox (LOPROX) 0.77 % cream Apply 1 application topically 2 (two) times daily. Pt applies to face.  . divalproex (DEPAKOTE SPRINKLE) 125 MG capsule Take 250 mg by mouth 2 (two) times daily.   Marland Kitchen  docusate (COLACE) 50 MG/5ML liquid Take 100 mg by mouth daily.  Marland Kitchen gabapentin (NEURONTIN) 100 MG capsule Take 100 mg by mouth 3 (three) times daily.  Marland Kitchen ketoconazole (NIZORAL) 2 % shampoo Apply 1 application topically 2 (two) times a week. Pt uses on Tuesday and Friday.  . magnesium hydroxide (MILK OF MAGNESIA) 400 MG/5ML suspension Take 15 mLs by mouth daily as needed for mild constipation or moderate constipation.   . Melatonin 10 MG TABS Take 1 tablet by mouth at bedtime.  . memantine (NAMENDA) 10 MG tablet Take 10 mg by mouth 2 (two) times daily.  . naproxen sodium (ANAPROX) 220 MG tablet Take 220 mg by mouth every 8 (eight) hours as needed (for discomfort).   . Olopatadine HCl (PATADAY) 0.2 % SOLN Apply 1 drop to eye daily.  Marland Kitchen omeprazole (PRILOSEC) 20 MG capsule Take 20 mg daily by mouth.  . polyethylene glycol (MIRALAX / GLYCOLAX) packet Take 17 g by mouth every other day.   . sertraline (ZOLOFT) 100 MG tablet Take 100 mg by mouth at bedtime.   No facility-administered encounter medications on file as of 02/01/2019.     Review of Systems  Constitutional: Positive for unexpected weight change. Negative for activity change, appetite change, chills, diaphoresis, fatigue and fever.  Respiratory: Positive for cough (chronic). Negative for shortness of breath, wheezing and stridor.   Cardiovascular: Positive for leg swelling. Negative for chest pain and palpitations.  Gastrointestinal: Negative for abdominal distention, abdominal pain, constipation and diarrhea.  Genitourinary: Positive for urgency. Negative for difficulty urinating and dysuria.  Musculoskeletal: Positive for gait problem. Negative for arthralgias, back pain, joint swelling and myalgias.  Neurological: Positive for weakness. Negative for dizziness, seizures, syncope, facial asymmetry, speech difficulty and headaches.  Hematological: Negative for adenopathy. Does not bruise/bleed easily.  Psychiatric/Behavioral: Positive for  confusion. Negative for agitation and behavioral problems.    Immunization History  Administered Date(s) Administered  . Influenza Inj Mdck Quad Pf 07/07/2016  . Influenza,inj,Quad PF,6+ Mos 07/10/2018  . Influenza-Unspecified 07/02/2015, 07/13/2017  . Pneumococcal Conjugate-13 02/10/2015  . Pneumococcal Polysaccharide-23 10/23/2006  . Tdap 04/19/2005, 10/25/2016   Pertinent  Health Maintenance Due  Topic Date Due  . INFLUENZA VACCINE  04/20/2019  . PNA vac Low Risk Adult  Completed   Fall Risk  01/10/2018 01/04/2017 07/04/2016 02/17/2016 08/23/2015  Falls in the past year? No No Yes No Yes  Number falls in past yr: - - 2 or more - 1  Injury with Fall? - - Yes - Yes  Comment - - - - -  Risk Factor Category  - - High Fall Risk - High Fall Risk  Risk for fall due to : - - History of fall(s);Impaired balance/gait - History of fall(s);Impaired balance/gait;Impaired mobility;Medication side effect;Mental status change  Follow up - - Falls evaluation completed - Falls evaluation completed;Education provided;Falls prevention discussed   Functional Status Survey:    Vitals:   02/01/19 1208  BP: (!) 148/82  Weight: 227 lb 9.6 oz (103.2 kg)   Body mass index is 30.03 kg/m. Physical Exam Vitals signs and nursing note reviewed.  Constitutional:      General: He is not in acute distress.    Appearance: He is not diaphoretic.  HENT:     Head: Normocephalic and atraumatic.     Right Ear: There is impacted cerumen.     Left Ear: There is impacted cerumen.     Nose: Nose normal. No congestion.  Eyes:     Conjunctiva/sclera: Conjunctivae normal.     Pupils: Pupils are equal, round, and reactive to light.  Neck:     Thyroid: No thyromegaly.     Vascular: No JVD.     Trachea: No tracheal deviation.     Comments: Decreased rom at the neck tilting to the left side with weakness Cardiovascular:     Rate and Rhythm: Normal rate and regular rhythm.     Heart sounds: No murmur.  Pulmonary:      Effort: Pulmonary effort is normal. No respiratory distress.     Breath sounds: Rales present. No wheezing.  Abdominal:     General: Bowel sounds are normal. There is no distension.     Palpations: Abdomen is soft.     Tenderness: There is no abdominal tenderness.  Musculoskeletal:     Right lower leg: Edema present.     Left lower leg: Edema present.  Lymphadenopathy:     Cervical: No cervical adenopathy.  Skin:    General: Skin is warm and dry.  Neurological:     Mental Status: He is alert. Mental status is at baseline.     Comments: Right sided weakness, oriented to self only  Psychiatric:        Mood and Affect: Mood normal.     Labs reviewed: Recent Labs    03/19/18 06/27/18 01/17/19  NA 139 141 140  K 4.3 4.4 4.2  BUN 17 18 18   CREATININE 0.7 0.7 0.7   Recent Labs    01/17/19  AST 19  ALT 15  ALKPHOS 102   Recent Labs    03/19/18 06/27/18 01/17/19  WBC 7.1 6.5 6.8  HGB 14.9 14.7 14.5  HCT 44 43 43  PLT 172 182 166   Lab Results  Component Value Date   TSH 2.56 08/22/2017   Lab Results  Component Value Date   HGBA1C 5.7 (H) 01/13/2013   Lab Results  Component Value Date   CHOL 193 08/17/2016   HDL 46 08/17/2016   LDLCALC 133 08/17/2016   TRIG 86 08/17/2016   CHOLHDL 3.9 05/13/2014    Significant Diagnostic Results in last 30 days:  No results found.  Assessment/Plan 1. Generalized edema It is unclear if the weight gain is due to CHF as his BNP was normal but CXR last month showed fluid overload.  Lasix 40 mg qod Kdur 40 meq qod BMP 2 weeks BP qod Monitor weight qod  2. Diastolic dysfunction He has a hx of diastolic CHF noted to be grade 2 on echo in 2014 Overload noted, see above  3. Multiple sclerosis (HCC) Severe with associated right sided weakness Continue supportive care only  4. Contracture of neck Wears a neck support and has personal caretakers for ROM and monitoring   5. Dementia associated with other underlying  disease with behavioral disturbance ([place]) Behaviors are improved per his caregiver Continue depakote and zoloft.   6. Dysphagia Continues with chronic cough with meals but  no fever or decreased in 02 sats today. Continue D2 diet with NTL and asp prec No feeding tubes or hospitalizations per most form   Family/ staff Communication: staff/resident  Labs/tests ordered:  BMP in 2 weeks

## 2019-02-15 DIAGNOSIS — D649 Anemia, unspecified: Secondary | ICD-10-CM | POA: Diagnosis not present

## 2019-02-15 DIAGNOSIS — R635 Abnormal weight gain: Secondary | ICD-10-CM | POA: Diagnosis not present

## 2019-02-15 LAB — BASIC METABOLIC PANEL
BUN: 24 — AB (ref 4–21)
Creatinine: 0.8 (ref 0.6–1.3)
Glucose: 89
Potassium: 4.2 (ref 3.4–5.3)
Sodium: 142 (ref 137–147)

## 2019-02-20 ENCOUNTER — Encounter: Payer: Self-pay | Admitting: Internal Medicine

## 2019-02-28 DIAGNOSIS — Z20828 Contact with and (suspected) exposure to other viral communicable diseases: Secondary | ICD-10-CM | POA: Diagnosis not present

## 2019-03-02 LAB — NOVEL CORONAVIRUS, NAA: SARS-CoV-2, NAA: NEGATIVE

## 2019-03-04 ENCOUNTER — Non-Acute Institutional Stay (SKILLED_NURSING_FACILITY): Payer: Medicare Other | Admitting: Adult Health

## 2019-03-04 DIAGNOSIS — N319 Neuromuscular dysfunction of bladder, unspecified: Secondary | ICD-10-CM | POA: Diagnosis not present

## 2019-03-04 DIAGNOSIS — J309 Allergic rhinitis, unspecified: Secondary | ICD-10-CM | POA: Diagnosis not present

## 2019-03-04 DIAGNOSIS — F02818 Dementia in other diseases classified elsewhere, unspecified severity, with other behavioral disturbance: Secondary | ICD-10-CM

## 2019-03-04 DIAGNOSIS — H1013 Acute atopic conjunctivitis, bilateral: Secondary | ICD-10-CM | POA: Diagnosis not present

## 2019-03-04 DIAGNOSIS — R1310 Dysphagia, unspecified: Secondary | ICD-10-CM | POA: Diagnosis not present

## 2019-03-04 DIAGNOSIS — G35 Multiple sclerosis: Secondary | ICD-10-CM | POA: Diagnosis not present

## 2019-03-04 DIAGNOSIS — I5189 Other ill-defined heart diseases: Secondary | ICD-10-CM | POA: Diagnosis not present

## 2019-03-04 DIAGNOSIS — F0281 Dementia in other diseases classified elsewhere with behavioral disturbance: Secondary | ICD-10-CM

## 2019-03-04 DIAGNOSIS — R635 Abnormal weight gain: Secondary | ICD-10-CM

## 2019-03-04 DIAGNOSIS — G35D Multiple sclerosis, unspecified: Secondary | ICD-10-CM

## 2019-03-05 ENCOUNTER — Encounter: Payer: Self-pay | Admitting: Adult Health

## 2019-03-05 NOTE — Progress Notes (Signed)
Location:  Occupational psychologist of Service:  SNF (31) Provider:   Cindi Carbon, ANP [place] [phone]   [name], DO  Patient Care Team: [name], DO as PCP - General (Geriatric Medicine) [name], MD as Consulting Physician (Neurology) [name], MD as Consulting Physician (Urology) [name], MD as Consulting Physician (General Surgery) [name], MD as Consulting Physician (Orthopedic Surgery) [name], MD as Consulting Physician ([facility]) [name], MD as Consulting Physician (Gastroenterology) [name], DO as Consulting Physician (Geriatric Medicine) Duffy, Creola Corn, LCSW as Social Worker (Licensed Clinical Social Worker)  Extended Emergency Contact Information Primary Emergency Contact: [name] Address: [address]          [place], [place] [address] [name] of [place] Phone: [phone] Mobile Phone: [phone] Relation: Spouse  Code Status:  DNR Goals of care: Advanced Directive information Advanced Directives 10/09/2018  Does Patient Have a Medical Advance Directive? Yes  Type of Paramedic of [place];Out of facility DNR (pink MOST or yellow form)  Does patient want to make changes to medical advance directive? No - Patient declined  Copy of [place] in Chart? Yes - validated most recent copy scanned in chart (See row information)  Would patient like information on creating a medical advance directive? -  Pre-existing out of facility DNR order (yellow form or pink MOST form) Pink MOST form placed in chart (order not valid for inpatient use);Yellow form placed in chart (order not valid for inpatient use)     Chief Complaint  Patient presents with  . Medical Management of Chronic Issues    HPI:  Pt is a 78 y.o. male seen today for medical management of chronic diseases.   MS: has right sided  weakness and also generalized weakness. Needs assistance with all ADLs  Dementia due to underlying MS: Staff report he yells "help help" regularly and is easily angered. No hitting, kicking, or resistance to care. He denies all of this and denies feeling anxious or depressed  Neurogenic bladder: denies bladder pain, has s/p cath draining well  Had previously become edematous and gained weight with mild sob and hypoxia which improved with lasix. Weight down 4 lbs  Dysphagia: he has neck weakness and contracture and wears a support to allow for proper swallowing mechanics. He also is on a D2 NTL diet and tolerating well.   No itchy watery eyes or runny nose.  Functional status: hoyer lift incontinent Past Medical History:  Diagnosis Date  . Adenomatous polyp   . Allergy   . BPH (benign prostatic hyperplasia)   . COPD (chronic obstructive pulmonary disease) ([place])   . Dementia ([place]) 2010  . Elevated PSA 07/31/2014  . Foot drop, right 2008  . 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 Generalized weakness 01/12/2013  . GERD (gastroesophageal reflux disease)   . Hyperlipidemia 07/31/2014  . Multiple sclerosis ([place]) 2008  . Prostatitis, chronic    Past Surgical History:  Procedure Laterality Date  . CATARACT EXTRACTION Left 02/06/12  . CATARACT EXTRACTION Right 2013  . ESOPHAGOGASTRODUODENOSCOPY ENDOSCOPY  04/16/2002   Dr. Earle Gell  . HERNIA REPAIR  1980'2000   3 inguinal hernia repairs on left  . HERNIA REPAIR  06/16/11   right inguinal hernia repair with mesh   . PROSTATE SURGERY  09/29/2008   Dr. Karsten Ro     Allergies  Allergen Reactions  . Aricept [Donepezil Hcl]  Other (See Comments)    Reaction:  Unknown   . Dimethyl Fumarate Nausea And Vomiting    Outpatient Encounter Medications as of 03/04/2019  Medication Sig  . cetirizine (ZYRTEC) 10 MG tablet Take 10 mg by mouth at bedtime.  . ciclopirox (LOPROX) 0.77 % cream Apply 1 application topically 2 (two) times daily. Pt applies to  face.  . divalproex (DEPAKOTE SPRINKLE) 125 MG capsule Take 250 mg by mouth 2 (two) times daily.   Marland Kitchen docusate (COLACE) 50 MG/5ML liquid Take 100 mg by mouth daily.  Marland Kitchen gabapentin (NEURONTIN) 100 MG capsule Take 100 mg by mouth 3 (three) times daily.  Marland Kitchen ketoconazole (NIZORAL) 2 % shampoo Apply 1 application topically 2 (two) times a week. Pt uses on Tuesday and Friday.  . magnesium hydroxide (MILK OF MAGNESIA) 400 MG/5ML suspension Take 15 mLs by mouth daily as needed for mild constipation or moderate constipation.   . Melatonin 10 MG TABS Take 1 tablet by mouth at bedtime.  . memantine (NAMENDA) 10 MG tablet Take 10 mg by mouth 2 (two) times daily.  . naproxen sodium (ANAPROX) 220 MG tablet Take 220 mg by mouth every 8 (eight) hours as needed (for discomfort).   . Olopatadine HCl (PATADAY) 0.2 % SOLN Apply 1 drop to eye daily.  Marland Kitchen omeprazole (PRILOSEC) 20 MG capsule Take 20 mg daily by mouth.  . polyethylene glycol (MIRALAX / GLYCOLAX) packet Take 17 g by mouth every other day.   . sertraline (ZOLOFT) 100 MG tablet Take 100 mg by mouth at bedtime.   No facility-administered encounter medications on file as of 03/04/2019.     Review of Systems  Constitutional: Negative for activity change, appetite change, chills, diaphoresis, fatigue, fever and unexpected weight change.  Eyes: Negative for visual disturbance.  Respiratory: Negative for cough, shortness of breath, wheezing and stridor.   Cardiovascular: Positive for leg swelling. Negative for chest pain and palpitations.  Gastrointestinal: Negative for abdominal distention, abdominal pain, constipation and diarrhea.  Genitourinary: Negative for difficulty urinating and dysuria.  Musculoskeletal: Positive for gait problem and neck stiffness. Negative for arthralgias, back pain, joint swelling and myalgias.  Skin: Negative for wound.  Neurological: Positive for weakness. Negative for dizziness, seizures, syncope, facial asymmetry, speech  difficulty and headaches.  Hematological: Negative for adenopathy. Does not bruise/bleed easily.  Psychiatric/Behavioral: Positive for agitation, behavioral problems and confusion. Negative for dysphoric mood, hallucinations, self-injury, sleep disturbance and suicidal ideas. The patient is not nervous/anxious and is not hyperactive.     Immunization History  Administered Date(s) Administered  . Influenza Inj Mdck Quad Pf 07/07/2016  . Influenza,inj,Quad PF,6+ Mos 07/10/2018  . Influenza-Unspecified 07/02/2015, 07/13/2017  . Pneumococcal Conjugate-13 02/10/2015  . Pneumococcal Polysaccharide-23 10/23/2006  . Tdap 04/19/2005, 10/25/2016   Pertinent  Health Maintenance Due  Topic Date Due  . INFLUENZA VACCINE  04/20/2019  . PNA vac Low Risk Adult  Completed   Fall Risk  01/10/2018 01/04/2017 07/04/2016 02/17/2016 08/23/2015  Falls in the past year? No No Yes No Yes  Number falls in past yr: - - 2 or more - 1  Injury with Fall? - - Yes - Yes  Comment - - - - -  Risk Factor Category  - - High Fall Risk - High Fall Risk  Risk for fall due to : - - History of fall(s);Impaired balance/gait - History of fall(s);Impaired balance/gait;Impaired mobility;Medication side effect;Mental status change  Follow up - - Falls evaluation completed - Falls evaluation completed;Education provided;Falls prevention discussed  Functional Status Survey:    Vitals:   03/05/19 0719  Pulse: 68  Resp: 20  Temp: 97.8 F (36.6 C)  SpO2: 94%  Weight: 224 lb 9.6 oz (101.9 kg)   Body mass index is 29.63 kg/m. Physical Exam Constitutional:      General: He is not in acute distress.    Appearance: He is not diaphoretic.  HENT:     Head: Normocephalic and atraumatic.  Neck:     Musculoskeletal: Neck rigidity present.     Thyroid: No thyromegaly.     Vascular: No JVD.     Trachea: No tracheal deviation.  Cardiovascular:     Rate and Rhythm: Normal rate and regular rhythm.     Heart sounds: No murmur.   Pulmonary:     Effort: Pulmonary effort is normal. No respiratory distress.     Breath sounds: Normal breath sounds. No wheezing.  Abdominal:     General: Bowel sounds are normal. There is no distension.     Palpations: Abdomen is soft.     Tenderness: There is no abdominal tenderness.  Musculoskeletal:     Right lower leg: Edema present.     Left lower leg: Edema present.  Lymphadenopathy:     Cervical: No cervical adenopathy.  Skin:    General: Skin is warm and dry.  Neurological:     Mental Status: He is alert. Mental status is at baseline.     Comments: Oriented to self and place not time or situation. Right sided weakness which is chronic.   Psychiatric:        Mood and Affect: Mood normal.     Labs reviewed: Recent Labs    06/27/18 01/17/19 02/15/19 0600  NA 141 140 142  K 4.4 4.2 4.2  BUN 18 18 24*  CREATININE 0.7 0.7 0.8   Recent Labs    01/17/19  AST 19  ALT 15  ALKPHOS 102   Recent Labs    03/19/18 06/27/18 01/17/19  WBC 7.1 6.5 6.8  HGB 14.9 14.7 14.5  HCT 44 43 43  PLT 172 182 166   Lab Results  Component Value Date   TSH 2.56 08/22/2017   Lab Results  Component Value Date   HGBA1C 5.7 (H) 01/13/2013   Lab Results  Component Value Date   CHOL 193 08/17/2016   HDL 46 08/17/2016   LDLCALC 133 08/17/2016   TRIG 86 08/17/2016   CHOLHDL 3.9 05/13/2014    Significant Diagnostic Results in last 30 days:  No results found.  Assessment/Plan 1. Multiple sclerosis (HCC) Severe with right sided weakness, requiring skilled care. Comfort care goals in place with no further treatment indicated for this problem.   2. Dementia associated with other underlying disease with behavioral disturbance ([place]) He continues with period of agitation described as yelling for help or getting angry. Would not taper depakote 250 mg bid at this time.   3. Dysphagia, unspecified type Continue modified diet, neck support, and asp prec  4. Allergic conjunctivitis of  both eyes and rhinitis Continue zyrtec 10 mg qhs and pataday drops daily  5. Neurogenic bladder Continue neurontin 100 mg tid  6. Weight gain Edema improved, weight down 4 lbs Continue lasix 40 mg qod  Monitor weight daily Continue compression hose and elevation   7. Diastolic dysfunction Grade 2 on echo in 2015 Improved as above    Family/ staff Communication: staff  Labs/tests ordered:  NA

## 2019-03-19 DIAGNOSIS — F0151 Vascular dementia with behavioral disturbance: Secondary | ICD-10-CM | POA: Diagnosis not present

## 2019-03-19 DIAGNOSIS — R569 Unspecified convulsions: Secondary | ICD-10-CM | POA: Diagnosis not present

## 2019-03-21 ENCOUNTER — Non-Acute Institutional Stay (SKILLED_NURSING_FACILITY): Payer: Medicare Other | Admitting: Adult Health

## 2019-03-21 DIAGNOSIS — I5189 Other ill-defined heart diseases: Secondary | ICD-10-CM

## 2019-03-21 DIAGNOSIS — R635 Abnormal weight gain: Secondary | ICD-10-CM

## 2019-03-21 NOTE — Progress Notes (Signed)
Location:  [facility] of Service:  SNF (31) Provider:   Cindi Carbon, ANP [place] [phone]  [name], DO  Patient Care Team: [name], DO as PCP - General (Geriatric Medicine) [name], MD as Consulting Physician (Neurology) [name], MD as Consulting Physician (Urology) [name], MD as Consulting Physician (General Surgery) [name], MD as Consulting Physician (Orthopedic Surgery) [name], MD as Consulting Physician ([facility]) [name], MD as Consulting Physician (Gastroenterology) [name], DO as Consulting Physician (Geriatric Medicine) Duffy, Creola Corn, LCSW as Social Worker (Licensed Clinical Social Worker)  Extended Emergency Contact Information Primary Emergency Contact: [name] Address: [address]          [place], [place] [address] [name] of [place] Phone: [phone] Mobile Phone: [phone] Relation: Spouse  Code Status:  DNR Goals of care: Advanced Directive information Advanced Directives 10/09/2018  Does Patient Have a Medical Advance Directive? Yes  Type of Paramedic of [place];Out of facility DNR (pink MOST or yellow form)  Does patient want to make changes to medical advance directive? No - Patient declined  Copy of [place] in Chart? Yes - validated most recent copy scanned in chart (See row information)  Would patient like information on creating a medical advance directive? -  Pre-existing out of facility DNR order (yellow form or pink MOST form) Pink MOST form placed in chart (order not valid for inpatient use);Yellow form placed in chart (order not valid for inpatient use)     Chief Complaint  Patient presents with  . Acute Visit    increased edema and weight gain    HPI:  Pt is a 78 y.o. male seen today for an acute visit for weight gain and worsening edema.  The nurse reports he has more edema in his right side and his abd is bloated. No sob, doe, pnd, or cp. He has gained 5 lbs in the past two weeks. He was started on Lasix in April of 2020 due to hypoxia, weight gain, edema, and CXR indicating CHF. He is currently on lasix 40 mg qod.   Wt Readings from Last 3 Encounters:  03/22/19 229 lb (103.9 kg)  03/05/19 224 lb 9.6 oz (101.9 kg)  02/01/19 227 lb 9.6 oz (103.2 kg)    Past Medical History:  Diagnosis Date  . Adenomatous polyp   . Allergy   . BPH (benign prostatic hyperplasia)   . COPD (chronic obstructive pulmonary disease) ([place])   . Dementia ([place]) 2010  . Elevated PSA 07/31/2014  . Foot drop, right 2008  . 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 Generalized weakness 01/12/2013  . GERD (gastroesophageal reflux disease)   . Hyperlipidemia 07/31/2014  . Multiple sclerosis ([place]) 2008  . Prostatitis, chronic    Past Surgical History:  Procedure Laterality Date  . CATARACT EXTRACTION Left 02/06/12  . CATARACT EXTRACTION Right 2013  . ESOPHAGOGASTRODUODENOSCOPY ENDOSCOPY  04/16/2002   Dr. Earle Gell  . HERNIA REPAIR  1980'2000   3 inguinal hernia repairs on left  . HERNIA REPAIR  06/16/11   right inguinal hernia repair with mesh   . PROSTATE SURGERY  09/29/2008   Dr. Karsten Ro     Allergies  Allergen Reactions  . Aricept [Donepezil Hcl] Other (See Comments)    Reaction:  Unknown   . Dimethyl Fumarate Nausea And Vomiting    Outpatient Encounter Medications as of 03/21/2019  Medication Sig  . cetirizine (ZYRTEC) 10 MG tablet Take 10 mg by mouth at bedtime.  . ciclopirox (LOPROX) 0.77 % cream Apply 1 application topically 2 (two) times daily. Pt applies to face.  . divalproex (DEPAKOTE SPRINKLE) 125 MG capsule Take 250 mg by mouth 2 (two) times daily.   Marland Kitchen docusate (COLACE) 50 MG/5ML liquid Take 100 mg by mouth daily.  Marland Kitchen gabapentin (NEURONTIN) 100 MG capsule Take 100 mg by mouth 3 (three) times daily.  Marland Kitchen ketoconazole (NIZORAL) 2 % shampoo  Apply 1 application topically 2 (two) times a week. Pt uses on Tuesday and Friday.  . magnesium hydroxide (MILK OF MAGNESIA) 400 MG/5ML suspension Take 15 mLs by mouth daily as needed for mild constipation or moderate constipation.   . Melatonin 10 MG TABS Take 1 tablet by mouth at bedtime.  . memantine (NAMENDA) 10 MG tablet Take 10 mg by mouth 2 (two) times daily.  . naproxen sodium (ANAPROX) 220 MG tablet Take 220 mg by mouth every 8 (eight) hours as needed (for discomfort).   . Olopatadine HCl (PATADAY) 0.2 % SOLN Apply 1 drop to eye daily.  Marland Kitchen omeprazole (PRILOSEC) 20 MG capsule Take 20 mg daily by mouth.  . polyethylene glycol (MIRALAX / GLYCOLAX) packet Take 17 g by mouth every other day.   . sertraline (ZOLOFT) 100 MG tablet Take 100 mg by mouth at bedtime.   No facility-administered encounter medications on file as of 03/21/2019.     Review of Systems  Constitutional: Positive for unexpected weight change. Negative for activity change, appetite change, chills, diaphoresis, fatigue and fever.  Respiratory: Negative for cough, shortness of breath, wheezing and stridor.   Cardiovascular: Positive for leg swelling. Negative for chest pain and palpitations.  Gastrointestinal: Negative for abdominal distention, abdominal pain, constipation and diarrhea.  Genitourinary: Negative for difficulty urinating and dysuria.  Musculoskeletal: Positive for gait problem. Negative for arthralgias, back pain, joint swelling and myalgias.  Neurological: Negative for dizziness, seizures, syncope, facial asymmetry, speech difficulty, weakness and headaches.  Hematological: Negative for adenopathy. Does not bruise/bleed easily.  Psychiatric/Behavioral: Positive for agitation, behavioral problems and confusion.    Immunization History  Administered Date(s) Administered  . Influenza Inj Mdck Quad Pf 07/07/2016  . Influenza,inj,Quad PF,6+ Mos 07/10/2018  . Influenza-Unspecified 07/02/2015, 07/13/2017  .  Pneumococcal Conjugate-13 02/10/2015  . Pneumococcal Polysaccharide-23 10/23/2006  . Tdap 04/19/2005, 10/25/2016   Pertinent  Health Maintenance Due  Topic Date Due  . INFLUENZA VACCINE  04/20/2019  . PNA vac Low Risk Adult  Completed   Fall Risk  01/10/2018 01/04/2017 07/04/2016 02/17/2016 08/23/2015  Falls in the past year? No No Yes No Yes  Number falls in past yr: - - 2 or more - 1  Injury with Fall? - - Yes - Yes  Comment - - - - -  Risk Factor Category  - - High Fall Risk - High Fall Risk  Risk for fall due to : - - History of fall(s);Impaired balance/gait - History of fall(s);Impaired balance/gait;Impaired mobility;Medication side effect;Mental status change  Follow up - - Falls evaluation completed - Falls evaluation completed;Education provided;Falls prevention discussed   Functional Status Survey:    Vitals:   03/22/19 0714  Temp: 97.9 F (36.6 C)  Weight: 229 lb (103.9 kg)   Body mass index is 30.21 kg/m. Physical Exam Constitutional:      General: He is not in acute distress.    Appearance: He is not diaphoretic.  HENT:     Head:  Normocephalic and atraumatic.     Mouth/Throat:     Mouth: Mucous membranes are moist.     Pharynx: Oropharynx is clear.  Neck:     Thyroid: No thyromegaly.     Vascular: No JVD.     Trachea: No tracheal deviation.  Cardiovascular:     Rate and Rhythm: Normal rate and regular rhythm.     Heart sounds: No murmur.  Pulmonary:     Effort: Pulmonary effort is normal. No respiratory distress.     Breath sounds: No wheezing.     Comments: Decreased bases Abdominal:     General: Bowel sounds are normal. There is distension (mild).     Palpations: Abdomen is soft.     Tenderness: There is no abdominal tenderness. There is no right CVA tenderness, left CVA tenderness or guarding.  Musculoskeletal:     Right lower leg: Edema (R>L) present.     Left lower leg: Edema present.  Lymphadenopathy:     Cervical: No cervical adenopathy.  Skin:     General: Skin is warm and dry.  Neurological:     Mental Status: He is alert. Mental status is at baseline.     Comments: Oriented x 2. Right sided weakness   Psychiatric:        Mood and Affect: Mood normal.     Labs reviewed: Recent Labs    06/27/18 01/17/19 02/15/19 0600  NA 141 140 142  K 4.4 4.2 4.2  BUN 18 18 24*  CREATININE 0.7 0.7 0.8   Recent Labs    01/17/19  AST 19  ALT 15  ALKPHOS 102   Recent Labs    06/27/18 01/17/19  WBC 6.5 6.8  HGB 14.7 14.5  HCT 43 43  PLT 182 166   Lab Results  Component Value Date   TSH 2.56 08/22/2017   Lab Results  Component Value Date   HGBA1C 5.7 (H) 01/13/2013   Lab Results  Component Value Date   CHOL 193 08/17/2016   HDL 46 08/17/2016   LDLCALC 133 08/17/2016   TRIG 86 08/17/2016   CHOLHDL 3.9 05/13/2014    Significant Diagnostic Results in last 30 days:  No results found.  Assessment/Plan 1. Weight gain Likely due to a hx of diastolic dysfunction, as well as intake and medication related.  No abnormalities found in LFTS or protein  Increase Lasix 40 mg qd and Kdur 20 meq qd Continue to monitor weight and BP  2. Diastolic dysfunction Found on echo Grade 2 in 2015.  See above   Family/ staff Communication: staff  Labs/tests ordered:  BMP in 1 week

## 2019-03-22 ENCOUNTER — Encounter: Payer: Self-pay | Admitting: Adult Health

## 2019-03-28 DIAGNOSIS — Z5181 Encounter for therapeutic drug level monitoring: Secondary | ICD-10-CM | POA: Diagnosis not present

## 2019-03-28 DIAGNOSIS — D649 Anemia, unspecified: Secondary | ICD-10-CM | POA: Diagnosis not present

## 2019-03-28 LAB — BASIC METABOLIC PANEL
BUN: 23 — AB (ref 4–21)
Creatinine: 0.8 (ref 0.6–1.3)
Glucose: 87
Potassium: 4.4 (ref 3.4–5.3)
Sodium: 141 (ref 137–147)

## 2019-03-29 DIAGNOSIS — Z20828 Contact with and (suspected) exposure to other viral communicable diseases: Secondary | ICD-10-CM | POA: Diagnosis not present

## 2019-04-02 ENCOUNTER — Non-Acute Institutional Stay (SKILLED_NURSING_FACILITY): Payer: Medicare Other | Admitting: Internal Medicine

## 2019-04-02 ENCOUNTER — Encounter: Payer: Self-pay | Admitting: Internal Medicine

## 2019-04-02 DIAGNOSIS — G35 Multiple sclerosis: Secondary | ICD-10-CM

## 2019-04-02 DIAGNOSIS — I5189 Other ill-defined heart diseases: Secondary | ICD-10-CM

## 2019-04-02 DIAGNOSIS — Z9359 Other cystostomy status: Secondary | ICD-10-CM

## 2019-04-02 DIAGNOSIS — F0281 Dementia in other diseases classified elsewhere with behavioral disturbance: Secondary | ICD-10-CM

## 2019-04-02 DIAGNOSIS — F02818 Dementia in other diseases classified elsewhere, unspecified severity, with other behavioral disturbance: Secondary | ICD-10-CM

## 2019-04-02 DIAGNOSIS — Z0000 Encounter for general adult medical examination without abnormal findings: Secondary | ICD-10-CM | POA: Diagnosis not present

## 2019-04-02 DIAGNOSIS — K5904 Chronic idiopathic constipation: Secondary | ICD-10-CM

## 2019-04-02 DIAGNOSIS — N319 Neuromuscular dysfunction of bladder, unspecified: Secondary | ICD-10-CM

## 2019-04-02 DIAGNOSIS — Z20828 Contact with and (suspected) exposure to other viral communicable diseases: Secondary | ICD-10-CM | POA: Diagnosis not present

## 2019-04-02 NOTE — Progress Notes (Signed)
Provider:  Rexene Edison. Mariea Rogers, D.O., C.M.D. Location:  [facility] Room Number: 129 Place of Service:  SNF (31)  Previous PCP: Jose Curry, DO Patient Care Team: Jose Curry, DO as PCP - General (Geriatric Medicine) [name], Jose Polka, MD as Consulting Physician (Neurology) Jose Pulse, MD as Consulting Physician (Urology) Jose Confer, MD as Consulting Physician (General Surgery) Jose Server, MD as Consulting Physician (Orthopedic Surgery) Jose Skates, MD as Consulting Physician ([facility]) Jose Fair, MD as Consulting Physician (Gastroenterology) Jose Curry, DO as Consulting Physician (Geriatric Medicine) Duffy, Creola Corn, LCSW as Social Worker (Licensed Clinical Social Worker)  Extended Emergency Contact Information Primary Emergency Contact: [name] Address: [address]          [place], [place] [address] [name] of [place] Phone: [phone] Mobile Phone: [phone] Relation: Spouse  Code Status: DNR, MOST Goals of Care: Advanced Directive information Advanced Directives 10/09/2018  Does Patient Have a Medical Advance Directive? Yes  Type of Paramedic of [place];Out of facility DNR (pink MOST or yellow form)  Does patient want to make changes to medical advance directive? No - Patient declined  Copy of [place] in Chart? Yes - validated most recent copy scanned in chart (See row information)  Would patient like information on creating a medical advance directive? -  Pre-existing out of facility DNR order (yellow form or pink MOST form) Pink MOST form placed in chart (order not valid for inpatient use);Yellow form placed in chart (order not valid for inpatient use)   Chief Complaint  Patient presents with  . Annual Exam    CPE    HPI: Patient is a 78 y.o. male seen today for an annual physical exam.  [name] has declined quite a bit over the  past year.  He is confused the majority of the time now.  He also has struggled a lot more with postural stability and OT has worked aggressively to help with positioning of his neck and upper body.  He's had a few episodes of aspiration pneumonia.  He's struggled with behaviors related to his dementia including racial slurs and sexually inappropriate remarks.  Recently, this is improved according to nursing.  He now requires feeding in addition to the help with all of his other ADLs.    At one point, hospice was considered and requested by his wife.  His other family was not on board with this despite mine and [facility] recommendations so we were not able to pursue that in the long run to prevent family discord.  Recently, he had significant weight gain and generalized edema which responded to diuretics and was felt to be due to acute diastolic chf.  This is now under control.    He's had chronic heat for his neck/shoulder 3-4 times a day that will be ongoing.    He's been much more cooperative lately with daily care.  Past Medical History:  Diagnosis Date  . Adenomatous polyp   . Allergy   . BPH (benign prostatic hyperplasia)   . COPD (chronic obstructive pulmonary disease) ([place])   . Dementia ([place]) 2010  . Elevated PSA 07/31/2014  . Foot drop, right 2008  . 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 Generalized weakness 01/12/2013  . GERD (gastroesophageal reflux disease)   . Hyperlipidemia 07/31/2014  . Multiple sclerosis ([place]) 2008  . Prostatitis, chronic    Past Surgical History:  Procedure Laterality Date  . CATARACT EXTRACTION  Left 02/06/12  . CATARACT EXTRACTION Right 2013  . ESOPHAGOGASTRODUODENOSCOPY ENDOSCOPY  04/16/2002   Dr. Earle Rogers  . HERNIA REPAIR  1980'2000   3 inguinal hernia repairs on left  . HERNIA REPAIR  06/16/11   right inguinal hernia repair with mesh   . PROSTATE SURGERY  09/29/2008   Dr. Karsten Rogers     reports that he quit smoking about 43 years ago. He has a  10.00 pack-year smoking history. He has never used smokeless tobacco. He reports current alcohol use. He reports that he does not use drugs.  Functional Status Survey:    Family History  Problem Relation Age of Onset  . Depression Mother   . Dementia Father   . Pneumonia Father     Health Maintenance  Topic Date Due  . INFLUENZA VACCINE  04/20/2019  . TETANUS/TDAP  10/25/2026  . PNA vac Low Risk Adult  Completed    Allergies  Allergen Reactions  . Aricept [Donepezil Hcl] Other (See Comments)    Reaction:  Unknown   . Dimethyl Fumarate Nausea And Vomiting    Outpatient Encounter Medications as of 04/02/2019  Medication Sig  . cetirizine (ZYRTEC) 10 MG tablet Take 10 mg by mouth at bedtime.  . ciclopirox (LOPROX) 0.77 % cream Apply 1 application topically 2 (two) times daily. Pt applies to face.  . divalproex (DEPAKOTE SPRINKLE) 125 MG capsule Take 250 mg by mouth 2 (two) times daily.   Marland Kitchen docusate (COLACE) 50 MG/5ML liquid Take 100 mg by mouth daily.  . furosemide (LASIX) 40 MG tablet Take 40 mg by mouth daily.  Marland Kitchen gabapentin (NEURONTIN) 100 MG capsule Take 100 mg by mouth 3 (three) times daily.  Marland Kitchen ketoconazole (NIZORAL) 2 % shampoo Apply 1 application topically 2 (two) times a week. Pt uses on Tuesday and Friday.  . magnesium hydroxide (MILK OF MAGNESIA) 400 MG/5ML suspension Take 15 mLs by mouth daily as needed for mild constipation or moderate constipation.   . Melatonin 10 MG TABS Take 1 tablet by mouth at bedtime.  . memantine (NAMENDA) 10 MG tablet Take 10 mg by mouth 2 (two) times daily.  . Olopatadine HCl (PATADAY) 0.2 % SOLN Apply 1 drop to eye daily.  Marland Kitchen omeprazole (PRILOSEC) 20 MG capsule Take 20 mg daily by mouth.  . ondansetron (ZOFRAN) 4 MG tablet Take 4 mg by mouth every 8 (eight) hours as needed for nausea or vomiting.  . polyethylene glycol (MIRALAX / GLYCOLAX) packet Take 17 g by mouth every other day.   . potassium chloride SA (K-DUR) 20 MEQ tablet Take 20 mEq  by mouth daily.  . sertraline (ZOLOFT) 100 MG tablet Take 100 mg by mouth at bedtime.  . [DISCONTINUED] naproxen sodium (ANAPROX) 220 MG tablet Take 220 mg by mouth every 8 (eight) hours as needed (for discomfort).    No facility-administered encounter medications on file as of 04/02/2019.     Review of Systems  Constitutional: Positive for malaise/fatigue. Negative for chills and fever.  HENT: Negative for congestion.   Eyes:       Glasses  Respiratory: Negative for cough and shortness of breath.   Cardiovascular: Negative for chest pain, palpitations and leg swelling.  Gastrointestinal: Positive for constipation. Negative for abdominal pain, blood in stool, diarrhea and melena.  Genitourinary:       Chronic suprapubic catheter with dark yellow urine  Musculoskeletal: Positive for myalgias and neck pain. Negative for falls and joint pain.  Skin: Negative for rash.  Neurological:  Negative for dizziness and loss of consciousness.  Endo/Heme/Allergies: Negative for environmental allergies.  Psychiatric/Behavioral: Positive for depression and memory loss. Negative for hallucinations. The patient is not nervous/anxious and does not have insomnia.     Vitals:   04/02/19 1404  BP: 130/70  Rogers: 77  Resp: 17  Temp: 98.2 F (36.8 C)  TempSrc: Oral  SpO2: 91%  Weight: 227 lb (103 kg)  Height: 6\' 1"  (1.854 m)   Body mass index is 29.95 kg/m. Physical Exam Vitals signs reviewed.  Constitutional:      General: He is not in acute distress.    Appearance: He is not toxic-appearing.  HENT:     Head: Normocephalic and atraumatic.     Right Ear: External ear normal.     Left Ear: External ear normal.     Nose: No congestion.     Mouth/Throat:     Pharynx: Oropharynx is clear.  Eyes:     Extraocular Movements: Extraocular movements intact.     Conjunctiva/sclera: Conjunctivae normal.     Pupils: Pupils are equal, round, and reactive to light.  Cardiovascular:     Rate and Rhythm:  Normal rate and regular rhythm.     Pulses: Normal pulses.     Heart sounds: Normal heart sounds.     Comments: Minimal edema remains Pulmonary:     Effort: Pulmonary effort is normal.     Breath sounds: Normal breath sounds.  Abdominal:     General: Bowel sounds are normal. There is no distension.     Palpations: Abdomen is soft. There is no mass.     Tenderness: There is no abdominal tenderness. There is no guarding or rebound.  Musculoskeletal:     Comments: Chronic right foot drop; neck posture challenging--pillows and supports used to help with this  Skin:    General: Skin is warm and dry.     Capillary Refill: Capillary refill takes less than 2 seconds.  Neurological:     Mental Status: He is alert.     Comments: Much less conversive and in touch with his environment gradually over the past year  Psychiatric:        Mood and Affect: Mood normal.        Behavior: Behavior normal.     Labs reviewed: Basic Metabolic Panel: Recent Labs    06/27/18 01/17/19 02/15/19 0600  NA 141 140 142  K 4.4 4.2 4.2  BUN 18 18 24*  CREATININE 0.7 0.7 0.8   Liver Function Tests: Recent Labs    01/17/19  AST 19  ALT 15  ALKPHOS 102   No results for input(s): LIPASE, AMYLASE in the last 8760 hours. No results for input(s): AMMONIA in the last 8760 hours. CBC: Recent Labs    06/27/18 01/17/19  WBC 6.5 6.8  HGB 14.7 14.5  HCT 43 43  PLT 182 166   Cardiac Enzymes: No results for input(s): CKTOTAL, CKMB, CKMBINDEX, TROPONINI in the last 8760 hours. BNP: Invalid input(s): POCBNP Lab Results  Component Value Date   HGBA1C 5.7 (H) 01/13/2013   Lab Results  Component Value Date   TSH 2.56 08/22/2017   Lab Results  Component Value Date   VITAMINB12 679 01/21/2013   Lab Results  Component Value Date   FOLATE 16.9 01/21/2013   Assessment/Plan 1. Annual physical exam -performed today -overall considerable decline over past year -I still recommend hospice care for him   2. Multiple sclerosis ([place]) -advanced stage with more difficulty  with postural stability, contractures and dementia  3. Dementia associated with other underlying disease with behavioral disturbance ([place]) -behaviors improved recently--mood managed with zoloft 100mg  nightly, namenda for the dementia, melatonin for sleep, gabapentin to help neuropathic pain, depakote sprinkles for mood stability  4. Neurogenic bladder -continue suprapubic catheter with regular flushes and changes  5. Suprapubic catheter ([place]) -in place with dark yellow urine, continues to encourage hydration  6. Diastolic dysfunction -continue now on lasix and potassium therapy  7. Chronic idiopathic constipation -continue colace, miralax to manage this  Labs/tests ordered:  No new today  [name], D.O. [facility] [address], [place] [address] Cell Phone (Mon-Fri 8am-5pm):  [phone] On Call:  [phone] & follow prompts after 5pm & weekends Office Phone:  [phone] Office Fax:  [phone]

## 2019-04-08 LAB — NOVEL CORONAVIRUS, NAA: SARS-CoV-2, NAA: NEGATIVE

## 2019-04-15 LAB — NOVEL CORONAVIRUS, NAA: SARS-CoV-2, NAA: NEGATIVE

## 2019-04-19 ENCOUNTER — Other Ambulatory Visit: Payer: Self-pay

## 2019-05-06 ENCOUNTER — Non-Acute Institutional Stay (SKILLED_NURSING_FACILITY): Payer: Medicare Other | Admitting: Adult Health

## 2019-05-06 ENCOUNTER — Encounter: Payer: Self-pay | Admitting: Adult Health

## 2019-05-06 DIAGNOSIS — E669 Obesity, unspecified: Secondary | ICD-10-CM | POA: Diagnosis not present

## 2019-05-06 DIAGNOSIS — Z9359 Other cystostomy status: Secondary | ICD-10-CM

## 2019-05-06 DIAGNOSIS — F02818 Dementia in other diseases classified elsewhere, unspecified severity, with other behavioral disturbance: Secondary | ICD-10-CM

## 2019-05-06 DIAGNOSIS — G35 Multiple sclerosis: Secondary | ICD-10-CM | POA: Diagnosis not present

## 2019-05-06 DIAGNOSIS — N319 Neuromuscular dysfunction of bladder, unspecified: Secondary | ICD-10-CM | POA: Diagnosis not present

## 2019-05-06 DIAGNOSIS — F0281 Dementia in other diseases classified elsewhere with behavioral disturbance: Secondary | ICD-10-CM | POA: Diagnosis not present

## 2019-05-06 DIAGNOSIS — I5189 Other ill-defined heart diseases: Secondary | ICD-10-CM

## 2019-05-06 NOTE — Progress Notes (Signed)
Location:  Occupational psychologist of Service:  SNF (31) Provider:   Cindi Carbon, ANP [place] [phone]   [name], DO  Patient Care Team: [name], DO as PCP - General (Geriatric Medicine) [name], MD as Consulting Physician (Neurology) [name], MD as Consulting Physician (Urology) [name], MD as Consulting Physician (General Surgery) [name], MD as Consulting Physician (Orthopedic Surgery) [name], MD as Consulting Physician ([facility]) [name], MD as Consulting Physician (Gastroenterology) [name], DO as Consulting Physician (Geriatric Medicine) Duffy, Creola Corn, LCSW as Social Worker (Licensed Clinical Social Worker)  Extended Emergency Contact Information Primary Emergency Contact: [name] Address: [address]          [place], [place] [address] [name] of [name] Phone: [phone] Mobile Phone: [phone] Relation: Spouse  Code Status:  DNR Goals of care: Advanced Directive information Advanced Directives 10/09/2018  Does Patient Have a Medical Advance Directive? Yes  Type of Paramedic of [place];Out of facility DNR (pink MOST or yellow form)  Does patient want to make changes to medical advance directive? No - Patient declined  Copy of [place] in Chart? Yes - validated most recent copy scanned in chart (See row information)  Would patient like information on creating a medical advance directive? -  Pre-existing out of facility DNR order (yellow form or pink MOST form) Pink MOST form placed in chart (order not valid for inpatient use);Yellow form placed in chart (order not valid for inpatient use)     Chief Complaint  Patient presents with  . Medical Management of Chronic Issues    HPI:  Pt is a 78 y.o. male seen today for medical management of chronic diseases.   The nurse reports  that he has shown improvement in his mood with less agitation and yelling. He is currently on depakote for behaviors associated with dementia. He reports occasional pain to his bladder and is on neurontin tid for neurogenic bladder. (has s/p cath with no purulent drainage, fever, or dysuria) The nurse also reports that he is seems uncomfortable in his chair at times. He leans on his hand to hold his head up which is weak instead of allowing his support collar to support him. He slides down in his chair and needs frequent repositioning. He has been seen by OT in the past for these issues. Hospice has been recommended due to his severe MS and dysphagia but his family was not interested.  He has chronic edema and weight gain due to increased intake.  NO change with lasix. No sob or doe.   Functional status: hoyer lift Past Medical History:  Diagnosis Date  . Adenomatous polyp   . Allergy   . BPH (benign prostatic hyperplasia)   . COPD (chronic obstructive pulmonary disease) ([place])   . Dementia ([place]) 2010  . Elevated PSA 07/31/2014  . Foot drop, right 2008  . 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 Generalized weakness 01/12/2013  . GERD (gastroesophageal reflux disease)   . Hyperlipidemia 07/31/2014  . Multiple sclerosis ([place]) 2008  . Prostatitis, chronic    Past Surgical History:  Procedure Laterality Date  . CATARACT EXTRACTION Left 02/06/12  . CATARACT EXTRACTION Right 2013  . ESOPHAGOGASTRODUODENOSCOPY ENDOSCOPY  04/16/2002   Dr. Earle Gell  . HERNIA REPAIR  1980'2000   3 inguinal hernia repairs on left  . HERNIA REPAIR  06/16/11   right inguinal  hernia repair with mesh   . PROSTATE SURGERY  09/29/2008   Dr. Karsten Ro     Allergies  Allergen Reactions  . Aricept [Donepezil Hcl] Other (See Comments)    Reaction:  Unknown   . Dimethyl Fumarate Nausea And Vomiting    Outpatient Encounter Medications as of 05/06/2019  Medication Sig  . cetirizine (ZYRTEC) 10 MG tablet Take 10 mg by mouth at  bedtime.  . ciclopirox (LOPROX) 0.77 % cream Apply 1 application topically 2 (two) times daily. Pt applies to face.  . divalproex (DEPAKOTE SPRINKLE) 125 MG capsule Take 250 mg by mouth 2 (two) times daily.   Marland Kitchen docusate (COLACE) 50 MG/5ML liquid Take 100 mg by mouth daily.  . furosemide (LASIX) 40 MG tablet Take 40 mg by mouth daily.  Marland Kitchen gabapentin (NEURONTIN) 100 MG capsule Take 100 mg by mouth 3 (three) times daily.  Marland Kitchen ketoconazole (NIZORAL) 2 % shampoo Apply 1 application topically 2 (two) times a week. Pt uses on Tuesday and Friday.  . magnesium hydroxide (MILK OF MAGNESIA) 400 MG/5ML suspension Take 15 mLs by mouth daily as needed for mild constipation or moderate constipation.   . Melatonin 10 MG TABS Take 1 tablet by mouth at bedtime.  . memantine (NAMENDA) 10 MG tablet Take 10 mg by mouth 2 (two) times daily.  . Olopatadine HCl (PATADAY) 0.2 % SOLN Apply 1 drop to eye daily.  Marland Kitchen omeprazole (PRILOSEC) 20 MG capsule Take 20 mg daily by mouth.  . ondansetron (ZOFRAN) 4 MG tablet Take 4 mg by mouth every 8 (eight) hours as needed for nausea or vomiting.  . polyethylene glycol (MIRALAX / GLYCOLAX) packet Take 17 g by mouth every other day.   . potassium chloride SA (K-DUR) 20 MEQ tablet Take 20 mEq by mouth daily.  . sertraline (ZOLOFT) 100 MG tablet Take 100 mg by mouth at bedtime.   No facility-administered encounter medications on file as of 05/06/2019.     Review of Systems  Constitutional: Negative for activity change, appetite change, chills, diaphoresis, fatigue, fever and unexpected weight change.  Respiratory: Negative for cough, shortness of breath, wheezing and stridor.   Cardiovascular: Positive for leg swelling. Negative for chest pain and palpitations.  Gastrointestinal: Negative for abdominal distention, abdominal pain, constipation and diarrhea.  Genitourinary: Negative for decreased urine volume, difficulty urinating, dysuria, scrotal swelling, testicular pain and urgency.   Musculoskeletal: Positive for gait problem. Negative for arthralgias, back pain, joint swelling and myalgias.  Neurological: Positive for weakness. Negative for dizziness, seizures, syncope, facial asymmetry, speech difficulty and headaches.  Hematological: Negative for adenopathy. Does not bruise/bleed easily.  Psychiatric/Behavioral: Positive for behavioral problems and confusion. Negative for agitation.    Immunization History  Administered Date(s) Administered  . Influenza Inj Mdck Quad Pf 07/07/2016  . Influenza,inj,Quad PF,6+ Mos 07/10/2018  . Influenza-Unspecified 07/02/2015, 07/13/2017  . Pneumococcal Conjugate-13 02/10/2015  . Pneumococcal Polysaccharide-23 10/23/2006  . Tdap 04/19/2005, 10/25/2016   Pertinent  Health Maintenance Due  Topic Date Due  . INFLUENZA VACCINE  04/20/2019  . PNA vac Low Risk Adult  Completed   Fall Risk  01/10/2018 01/04/2017 07/04/2016 02/17/2016 08/23/2015  Falls in the past year? No No Yes No Yes  Number falls in past yr: - - 2 or more - 1  Injury with Fall? - - Yes - Yes  Comment - - - - -  Risk Factor Category  - - High Fall Risk - High Fall Risk  Risk for fall due to : - -  History of fall(s);Impaired balance/gait - History of fall(s);Impaired balance/gait;Impaired mobility;Medication side effect;Mental status change  Follow up - - Falls evaluation completed - Falls evaluation completed;Education provided;Falls prevention discussed   Functional Status Survey:    Vitals:   05/06/19 1638  Weight: 229 lb 9.6 oz (104.1 kg)   Body mass index is 30.29 kg/m.  Wt Readings from Last 3 Encounters:  05/06/19 229 lb 9.6 oz (104.1 kg)  04/02/19 227 lb (103 kg)  03/22/19 229 lb (103.9 kg)    Physical Exam Vitals signs and nursing note reviewed.  Constitutional:      General: He is not in acute distress.    Appearance: He is not diaphoretic.  HENT:     Head: Normocephalic and atraumatic.     Nose: Nose normal. No congestion.     Mouth/Throat:      Mouth: Mucous membranes are moist.     Pharynx: Oropharynx is clear. No oropharyngeal exudate.  Neck:     Thyroid: No thyromegaly.     Vascular: No JVD.     Trachea: No tracheal deviation.     Comments: Neck with decreased ROM, no swelling or pain with palpitation or range of motion.  Cardiovascular:     Rate and Rhythm: Normal rate and regular rhythm.     Heart sounds: No murmur.  Pulmonary:     Effort: Pulmonary effort is normal. No respiratory distress.     Breath sounds: Normal breath sounds. No wheezing.  Abdominal:     General: Bowel sounds are normal. There is no distension.     Palpations: Abdomen is soft.     Tenderness: There is no abdominal tenderness.  Musculoskeletal:     Right lower leg: Edema present.     Left lower leg: Edema present.  Lymphadenopathy:     Cervical: No cervical adenopathy.  Skin:    General: Skin is warm and dry.  Neurological:     General: No focal deficit present.     Mental Status: He is alert. Mental status is at baseline.     Labs reviewed: Recent Labs    01/17/19 02/15/19 0600 03/28/19  NA 140 142 141  K 4.2 4.2 4.4  BUN 18 24* 23*  CREATININE 0.7 0.8 0.8   Recent Labs    01/17/19  AST 19  ALT 15  ALKPHOS 102   Recent Labs    06/27/18 01/17/19  WBC 6.5 6.8  HGB 14.7 14.5  HCT 43 43  PLT 182 166   Lab Results  Component Value Date   TSH 2.56 08/22/2017   Lab Results  Component Value Date   HGBA1C 5.7 (H) 01/13/2013   Lab Results  Component Value Date   CHOL 193 08/17/2016   HDL 46 08/17/2016   LDLCALC 133 08/17/2016   TRIG 86 08/17/2016   CHOLHDL 3.9 05/13/2014    Significant Diagnostic Results in last 30 days:  No results found.  Assessment/Plan 1. Obesity, Class I, BMI 30-34.9 He has progressively gained weight over the past year with associated edema. Staff to implement smaller portions but given his poor state health aggressive dieting would not recommended (QOL)  2. Multiple sclerosis (HCC)  Severe stage with significant weakness. Goals of care are comfort based with no hospitalizations.   3. Dementia associated with other underlying disease with behavioral disturbance ([place]) He continues to have periods of restlessness but is currently having less verbal outburst. Would continue depakote, namenda, and zoloft. Would not taper.   4. Neurogenic  bladder Continue neurontin 100 mg tid  5. Suprapubic catheter ([place]) No current issues Staff to monitor catheter and implement maintenance   6. Diastolic dysfunction He has not responded to lasix and BPs are soft, will reduce lasix to 40 mg qod and staff to monitor weight, edema, and resp status.    Family/ staff Communication: staff and resident   Labs/tests ordered:  NA

## 2019-06-03 ENCOUNTER — Non-Acute Institutional Stay (SKILLED_NURSING_FACILITY): Payer: Medicare Other | Admitting: Adult Health

## 2019-06-03 DIAGNOSIS — H1013 Acute atopic conjunctivitis, bilateral: Secondary | ICD-10-CM | POA: Diagnosis not present

## 2019-06-03 DIAGNOSIS — J309 Allergic rhinitis, unspecified: Secondary | ICD-10-CM | POA: Diagnosis not present

## 2019-06-03 DIAGNOSIS — I5189 Other ill-defined heart diseases: Secondary | ICD-10-CM | POA: Diagnosis not present

## 2019-06-03 DIAGNOSIS — F0281 Dementia in other diseases classified elsewhere with behavioral disturbance: Secondary | ICD-10-CM | POA: Diagnosis not present

## 2019-06-03 DIAGNOSIS — K219 Gastro-esophageal reflux disease without esophagitis: Secondary | ICD-10-CM

## 2019-06-03 DIAGNOSIS — F02818 Dementia in other diseases classified elsewhere, unspecified severity, with other behavioral disturbance: Secondary | ICD-10-CM

## 2019-06-03 DIAGNOSIS — R1310 Dysphagia, unspecified: Secondary | ICD-10-CM | POA: Diagnosis not present

## 2019-06-03 DIAGNOSIS — N319 Neuromuscular dysfunction of bladder, unspecified: Secondary | ICD-10-CM | POA: Diagnosis not present

## 2019-06-03 DIAGNOSIS — G35 Multiple sclerosis: Secondary | ICD-10-CM

## 2019-06-04 NOTE — Progress Notes (Signed)
Location:  Occupational psychologist of Service:  SNF (31) Provider:   Cindi Carbon, ANP [place] [phone]   [name], DO  Patient Care Team: [name], DO as PCP - General (Geriatric Medicine) [name], MD as Consulting Physician (Neurology) [name], MD as Consulting Physician (Urology) [name], MD as Consulting Physician (General Surgery) [name], MD as Consulting Physician (Orthopedic Surgery) [name], MD as Consulting Physician ([facility]) [name], MD as Consulting Physician (Gastroenterology) [name], DO as Consulting Physician (Geriatric Medicine) Duffy, Creola Corn, LCSW as Social Worker (Licensed Clinical Social Worker)  Extended Emergency Contact Information Primary Emergency Contact: [name] Address: [address]          [place], [place] [address] [name] of [place] Phone: [phone] Mobile Phone: [phone] Relation: Spouse  Code Status:  DNR Goals of care: Advanced Directive information Advanced Directives 10/09/2018  Does Patient Have a Medical Advance Directive? Yes  Type of Paramedic of [place];Out of facility DNR (pink MOST or yellow form)  Does patient want to make changes to medical advance directive? No - Patient declined  Copy of [place] in Chart? Yes - validated most recent copy scanned in chart (See row information)  Would patient like information on creating a medical advance directive? -  Pre-existing out of facility DNR order (yellow form or pink MOST form) Pink MOST form placed in chart (order not valid for inpatient use);Yellow form placed in chart (order not valid for inpatient use)     Chief Complaint  Patient presents with  . Medical Management of Chronic Issues    HPI:  Pt is a 78 y.o. male seen today for medical management of chronic diseases.    MS: severe with  right sided weakness. Requires assistance with all ADL's, incontinent of stool, needs hoyer for transfers.  Neck weakness continues to be an issue and he uses a brace for support and to prevent pain   Continues with behaviors of inappropriate comments, angry outburst, and yelling. Nurse requesting prn vistaril  Occasional reports of bladder pain, has suprapubic cath and takes neurontin which helps   Gerd: no reports of vomiting indigestion, or reflux  Dysphagia: no reports of coughing or choking on D2 NTL  Lasix added for edema and weight gain with minimal response. Weight up 2-3 lbs at 230. Eats large meals. Goals of care comfort based. NO sob or cp or worsening edema at this time   Functional status: hoyer lift Past Medical History:  Diagnosis Date  . Adenomatous polyp   . Allergy   . BPH (benign prostatic hyperplasia)   . COPD (chronic obstructive pulmonary disease) ([place])   . Dementia ([place]) 2010  . Elevated PSA 07/31/2014  . Foot drop, right 2008  . 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 Generalized weakness 01/12/2013  . GERD (gastroesophageal reflux disease)   . Hyperlipidemia 07/31/2014  . Multiple sclerosis ([place]) 2008  . Prostatitis, chronic    Past Surgical History:  Procedure Laterality Date  . CATARACT EXTRACTION Left 02/06/12  . CATARACT EXTRACTION Right 2013  . ESOPHAGOGASTRODUODENOSCOPY ENDOSCOPY  04/16/2002   Dr. Earle Gell  . HERNIA REPAIR  1980'2000   3 inguinal hernia repairs on left  . HERNIA REPAIR  06/16/11   right inguinal hernia repair with mesh   . PROSTATE SURGERY  09/29/2008   Dr. Karsten Ro     Allergies  Allergen Reactions  .  Aricept [Donepezil Hcl] Other (See Comments)    Reaction:  Unknown   . Dimethyl Fumarate Nausea And Vomiting    Outpatient Encounter Medications as of 06/03/2019  Medication Sig  . cetirizine (ZYRTEC) 10 MG tablet Take 10 mg by mouth at bedtime.  . ciclopirox (LOPROX) 0.77 % cream Apply 1 application topically 2 (two) times daily.  Pt applies to face.  . divalproex (DEPAKOTE SPRINKLE) 125 MG capsule Take 250 mg by mouth 2 (two) times daily.   Marland Kitchen docusate (COLACE) 50 MG/5ML liquid Take 100 mg by mouth daily.  . furosemide (LASIX) 40 MG tablet Take 40 mg by mouth every other day.   . gabapentin (NEURONTIN) 100 MG capsule Take 100 mg by mouth 3 (three) times daily.  . hydrOXYzine (ATARAX/VISTARIL) 25 MG tablet Take 25 mg by mouth every 6 (six) hours as needed.  Marland Kitchen ketoconazole (NIZORAL) 2 % shampoo Apply 1 application topically 2 (two) times a week. Pt uses on Tuesday and Friday.  . magnesium hydroxide (MILK OF MAGNESIA) 400 MG/5ML suspension Take 15 mLs by mouth daily as needed for mild constipation or moderate constipation.   . Melatonin 10 MG TABS Take 1 tablet by mouth at bedtime.  . memantine (NAMENDA) 10 MG tablet Take 10 mg by mouth 2 (two) times daily.  . Olopatadine HCl (PATADAY) 0.2 % SOLN Apply 1 drop to eye daily.  Marland Kitchen omeprazole (PRILOSEC) 20 MG capsule Take 20 mg daily by mouth.  . ondansetron (ZOFRAN) 4 MG tablet Take 4 mg by mouth every 8 (eight) hours as needed for nausea or vomiting.  . polyethylene glycol (MIRALAX / GLYCOLAX) packet Take 17 g by mouth every other day.   . potassium chloride SA (K-DUR) 20 MEQ tablet Take 20 mEq by mouth daily.  . sertraline (ZOLOFT) 100 MG tablet Take 100 mg by mouth at bedtime.   No facility-administered encounter medications on file as of 06/03/2019.     Review of Systems  Constitutional: Negative for activity change, appetite change, chills, diaphoresis, fatigue, fever and unexpected weight change.  Respiratory: Negative for cough, shortness of breath, wheezing and stridor.   Cardiovascular: Positive for leg swelling. Negative for chest pain and palpitations.  Gastrointestinal: Negative for abdominal distention, abdominal pain, constipation and diarrhea.  Genitourinary: Negative for decreased urine volume, difficulty urinating, dysuria, scrotal swelling, testicular pain  and urgency.  Musculoskeletal: Positive for gait problem. Negative for arthralgias, back pain, joint swelling and myalgias.  Neurological: Positive for weakness. Negative for dizziness, seizures, syncope, facial asymmetry, speech difficulty and headaches.  Hematological: Negative for adenopathy. Does not bruise/bleed easily.  Psychiatric/Behavioral: Positive for behavioral problems and confusion. Negative for agitation.    Immunization History  Administered Date(s) Administered  . Influenza Inj Mdck Quad Pf 07/07/2016  . Influenza,inj,Quad PF,6+ Mos 07/10/2018  . Influenza-Unspecified 07/02/2015, 07/13/2017  . Pneumococcal Conjugate-13 02/10/2015  . Pneumococcal Polysaccharide-23 10/23/2006  . Tdap 04/19/2005, 10/25/2016   Pertinent  Health Maintenance Due  Topic Date Due  . INFLUENZA VACCINE  04/20/2019  . PNA vac Low Risk Adult  Completed   Fall Risk  01/10/2018 01/04/2017 07/04/2016 02/17/2016 08/23/2015  Falls in the past year? No No Yes No Yes  Number falls in past yr: - - 2 or more - 1  Injury with Fall? - - Yes - Yes  Comment - - - - -  Risk Factor Category  - - High Fall Risk - High Fall Risk  Risk for fall due to : - - History of fall(s);Impaired  balance/gait - History of fall(s);Impaired balance/gait;Impaired mobility;Medication side effect;Mental status change  Follow up - - Falls evaluation completed - Falls evaluation completed;Education provided;Falls prevention discussed   Functional Status Survey:    Vitals:   06/04/19 0839  Weight: 230 lb (104.3 kg)   Body mass index is 30.34 kg/m.  Wt Readings from Last 3 Encounters:  06/04/19 230 lb (104.3 kg)  05/06/19 229 lb 9.6 oz (104.1 kg)  04/02/19 227 lb (103 kg)    Physical Exam Vitals signs and nursing note reviewed.  Constitutional:      General: He is not in acute distress.    Appearance: He is not diaphoretic.  HENT:     Head: Normocephalic and atraumatic.     Nose: Nose normal. No congestion.      Mouth/Throat:     Mouth: Mucous membranes are moist.     Pharynx: Oropharynx is clear. No oropharyngeal exudate.  Neck:     Thyroid: No thyromegaly.     Vascular: No JVD.     Trachea: No tracheal deviation.     Comments: Neck with decreased ROM, no swelling or pain with palpitation or range of motion.  Cardiovascular:     Rate and Rhythm: Normal rate and regular rhythm.     Heart sounds: No murmur.  Pulmonary:     Effort: Pulmonary effort is normal. No respiratory distress.     Breath sounds: Normal breath sounds. No wheezing.  Abdominal:     General: Bowel sounds are normal. There is no distension.     Palpations: Abdomen is soft.     Tenderness: There is no abdominal tenderness.  Musculoskeletal:     Right lower leg: Edema present.     Left lower leg: Edema present.  Lymphadenopathy:     Cervical: No cervical adenopathy.  Skin:    General: Skin is warm and dry.  Neurological:     General: No focal deficit present.     Mental Status: He is alert. Mental status is at baseline.     Labs reviewed: Recent Labs    01/17/19 02/15/19 0600 03/28/19  NA 140 142 141  K 4.2 4.2 4.4  BUN 18 24* 23*  CREATININE 0.7 0.8 0.8   Recent Labs    01/17/19  AST 19  ALT 15  ALKPHOS 102   Recent Labs    06/27/18 01/17/19  WBC 6.5 6.8  HGB 14.7 14.5  HCT 43 43  PLT 182 166   Lab Results  Component Value Date   TSH 2.56 08/22/2017   Lab Results  Component Value Date   HGBA1C 5.7 (H) 01/13/2013   Lab Results  Component Value Date   CHOL 193 08/17/2016   HDL 46 08/17/2016   LDLCALC 133 08/17/2016   TRIG 86 08/17/2016   CHOLHDL 3.9 05/13/2014    Significant Diagnostic Results in last 30 days:  No results found.  Assessment/Plan  1. Multiple sclerosis ([place]) -severe in nature, continue with supportive care in the skilled environment. With OT interventions for his neck including brace  2. Gastroesophageal reflux disease without esophagitis Continue prilosec 20 mg qd    3. Allergic conjunctivitis of both eyes and rhinitis Continue pataday 1 gtt daily and zyrtec at bedtime   4. Dementia associated with other underlying disease with behavioral disturbance (HCC) Add vistaril 25 mg q 6 prn agitation Continue namenda,depakote, and zoloft   5. Neurogenic bladder Continue neurontin 100 mg tid  6. Diastolic dysfunction Continue lasix 40 mg qod  and monitor weight daily   7. Dysphagia, unspecified type Continue modified diet and asp prec   Family/ staff Communication: staff and resident   Labs/tests ordered:  NA

## 2019-06-06 ENCOUNTER — Encounter: Payer: Self-pay | Admitting: Adult Health

## 2019-06-19 DIAGNOSIS — Z9189 Other specified personal risk factors, not elsewhere classified: Secondary | ICD-10-CM | POA: Diagnosis not present

## 2019-06-27 DIAGNOSIS — Z20828 Contact with and (suspected) exposure to other viral communicable diseases: Secondary | ICD-10-CM | POA: Diagnosis not present

## 2019-07-02 ENCOUNTER — Encounter: Payer: Self-pay | Admitting: Internal Medicine

## 2019-07-02 ENCOUNTER — Non-Acute Institutional Stay (SKILLED_NURSING_FACILITY): Payer: Medicare Other | Admitting: Internal Medicine

## 2019-07-02 DIAGNOSIS — M436 Torticollis: Secondary | ICD-10-CM | POA: Diagnosis not present

## 2019-07-02 DIAGNOSIS — Z66 Do not resuscitate: Secondary | ICD-10-CM

## 2019-07-02 DIAGNOSIS — R1312 Dysphagia, oropharyngeal phase: Secondary | ICD-10-CM

## 2019-07-02 DIAGNOSIS — N319 Neuromuscular dysfunction of bladder, unspecified: Secondary | ICD-10-CM | POA: Diagnosis not present

## 2019-07-02 DIAGNOSIS — G35 Multiple sclerosis: Secondary | ICD-10-CM | POA: Diagnosis not present

## 2019-07-02 DIAGNOSIS — F02818 Dementia in other diseases classified elsewhere, unspecified severity, with other behavioral disturbance: Secondary | ICD-10-CM

## 2019-07-02 DIAGNOSIS — F0281 Dementia in other diseases classified elsewhere with behavioral disturbance: Secondary | ICD-10-CM

## 2019-07-02 DIAGNOSIS — Z23 Encounter for immunization: Secondary | ICD-10-CM | POA: Diagnosis not present

## 2019-07-02 NOTE — Progress Notes (Signed)
Patient ID: [name], male   DOB: 1941-02-13, 78 y.o.   MRN: [id number]  Location:  [facility] Room Number: 129-A Place of Service:  SNF (31) Provider:  [name], DO  Patient Care Team: [name], DO as PCP - General (Geriatric Medicine) [name], MD as Consulting Physician (Neurology) [name], MD as Consulting Physician (Urology) [name], MD as Consulting Physician (General Surgery) [name], MD as Consulting Physician (Orthopedic Surgery) [name], MD as Consulting Physician (General Surgery) [name], MD as Consulting Physician (Gastroenterology) [name], DO as Consulting Physician (Geriatric Medicine) Duffy, Creola Corn, LCSW as Social Worker (Licensed Clinical Social Worker)  Extended Emergency Contact Information Primary Emergency Contact: [name] Address: [address]          [place],  [address] [name] of [place] Phone: [phone] Mobile Phone: [phone] Relation: Spouse  Code Status:  DNR  Goals of care: Advanced Directive information Advanced Directives 07/02/2019  Does Patient Have a Medical Advance Directive? Yes  Type of Advance Directive Out of facility DNR (pink MOST or yellow form);Healthcare Power of Attorney  Does patient want to make changes to medical advance directive? No - Patient declined  Copy of [place] in Chart? Yes - validated most recent copy scanned in chart (See row information)  Would patient like information on creating a medical advance directive? -  Pre-existing out of facility DNR order (yellow form or pink MOST form) Yellow form placed in chart (order not valid for inpatient use);Pink MOST form placed in chart (order not valid for inpatient use)     Chief Complaint  Patient presents with  . Medical Management of Chronic Issues    [facility]ine [facility] at [facility]     . Immunizations    Flu vaccine will be administered at facility this month 06/2019   . Advanced Directive    Reactivate DNR order     HPI:  Pt is a 78 y.o. male seen today for medical management of chronic diseases.    Jonahtan's weight is 230 lbs.    His most recent suprapubic cath change was on 10/4 and he's had no complications.  He had an exposure to a caregiver early this month who may have had covid-19 so he was in isolation but resident's test from 10/7 returned negative.    There are no other documented concerns in his matrix chart.    Upon speaking with nursing, they reported no new concerns with Z[place] and neither did his caregiver.  He is continuing to struggle with his neck positioning despite numerous OT approaches for support which are in place.  Behaviors have not been reported to be concerning lately.  Past Medical History:  Diagnosis Date  . Adenomatous polyp   . Allergy   . BPH (benign prostatic hyperplasia)   . COPD (chronic obstructive pulmonary disease) ([place])   . Dementia ([place]) 2010  . Elevated PSA 07/31/2014  . Foot drop, right 2008  . 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 Generalized weakness 01/12/2013  . GERD (gastroesophageal reflux disease)   . Hyperlipidemia 07/31/2014  . Multiple sclerosis ([place]) 2008  . Prostatitis, chronic    Past Surgical History:  Procedure Laterality Date  . CATARACT EXTRACTION Left 02/06/12  . CATARACT EXTRACTION Right 2013  . ESOPHAGOGASTRODUODENOSCOPY ENDOSCOPY  04/16/2002   Dr. Earle Gell  . HERNIA REPAIR  1980'2000   3 inguinal hernia repairs on  left  . HERNIA REPAIR  06/16/11   right inguinal hernia repair with mesh   . PROSTATE SURGERY  09/29/2008   Dr. Karsten Ro     Allergies  Allergen Reactions  . Aricept [Donepezil Hcl] Other (See Comments)    Reaction:  Unknown   . Dimethyl Fumarate Nausea And Vomiting    Outpatient Encounter Medications as of 07/02/2019  Medication Sig  . cetirizine (ZYRTEC) 10 MG tablet Take  10 mg by mouth at bedtime.  . ciclopirox (LOPROX) 0.77 % cream Apply 1 application topically 2 (two) times daily. Pt applies to face.  . divalproex (DEPAKOTE SPRINKLE) 125 MG capsule Take 250 mg by mouth 2 (two) times daily.   Marland Kitchen docusate (COLACE) 50 MG/5ML liquid Take 100 mg by mouth daily. And give HS as needed for constipation. Hold for loose stool  . furosemide (LASIX) 40 MG tablet Take 40 mg by mouth every other day.   . gabapentin (NEURONTIN) 100 MG capsule Take 100 mg by mouth 3 (three) times daily.  Marland Kitchen ketoconazole (NIZORAL) 2 % shampoo Apply 1 application topically 2 (two) times a week. Pt uses on Monday and Thursday  . magnesium hydroxide (MILK OF MAGNESIA) 400 MG/5ML suspension Take 15 mLs by mouth daily as needed for mild constipation or moderate constipation.   . Melatonin 10 MG TABS Take 1 tablet by mouth at bedtime.  . memantine (NAMENDA) 10 MG tablet Take 10 mg by mouth 2 (two) times daily.  . Olopatadine HCl (PATADAY) 0.2 % SOLN Apply 1 drop to eye daily.  Marland Kitchen omeprazole (PRILOSEC) 20 MG capsule Take 20 mg daily by mouth.  . ondansetron (ZOFRAN) 4 MG tablet Take 4 mg by mouth every 8 (eight) hours as needed for nausea or vomiting.  . polyethylene glycol (MIRALAX / GLYCOLAX) packet Take 17 g by mouth every other day.   . potassium chloride SA (K-DUR) 20 MEQ tablet Take 20 mEq by mouth every other day.   . sertraline (ZOLOFT) 100 MG tablet Take 100 mg by mouth at bedtime.  . [DISCONTINUED] hydrOXYzine (ATARAX/VISTARIL) 25 MG tablet Take 25 mg by mouth every 6 (six) hours as needed.   No facility-administered encounter medications on file as of 07/02/2019.     Review of Systems  Constitutional: Negative for activity change, appetite change, chills and fever.       Weight reported to be trending up but has been around this level for several months and oscillating  HENT: Positive for congestion and trouble swallowing.   Eyes: Negative for visual disturbance.  Respiratory: Negative  for shortness of breath.        Dysphagia  Cardiovascular: Negative for chest pain, palpitations and leg swelling.  Gastrointestinal: Negative for abdominal pain, blood in stool, constipation, diarrhea, nausea and vomiting.  Genitourinary: Negative for dysuria and urgency.       Suprapubic catheter in place  Musculoskeletal: Positive for gait problem, neck pain and neck stiffness.       Chronic foot drop  Skin: Positive for pallor.  Neurological: Negative for dizziness and weakness.  Psychiatric/Behavioral: Positive for confusion. Negative for agitation, behavioral problems, sleep disturbance and suicidal ideas. The patient is not nervous/anxious.     Immunization History  Administered Date(s) Administered  . Influenza Inj Mdck Quad Pf 07/07/2016  . Influenza,inj,Quad PF,6+ Mos 07/10/2018  . Influenza-Unspecified 07/02/2015, 07/13/2017  . Pneumococcal Conjugate-13 02/10/2015  . Pneumococcal Polysaccharide-23 10/23/2006  . Tdap 04/19/2005, 10/25/2016   Pertinent  Health Maintenance Due  Topic Date Due  .  INFLUENZA VACCINE  04/20/2019  . PNA vac Low Risk Adult  Completed   Fall Risk  01/10/2018 01/04/2017 07/04/2016 02/17/2016 08/23/2015  Falls in the past year? No No Yes No Yes  Number falls in past yr: - - 2 or more - 1  Injury with Fall? - - Yes - Yes  Comment - - - - -  Risk Factor Category  - - High Fall Risk - High Fall Risk  Risk for fall due to : - - History of fall(s);Impaired balance/gait - History of fall(s);Impaired balance/gait;Impaired mobility;Medication side effect;Mental status change  Follow up - - Falls evaluation completed - Falls evaluation completed;Education provided;Falls prevention discussed   Functional Status Survey:    Vitals:   07/02/19 1024  BP: 140/82  Pulse: 68  Resp: 20  Temp: (!) 97.1 F (36.2 C)  SpO2: 97%  Weight: 230 lb (104.3 kg)  Height: 6\' 1"  (1.854 m)   Body mass index is 30.34 kg/m. Physical Exam Vitals signs reviewed.   Constitutional:      General: He is not in acute distress.    Appearance: He is not toxic-appearing.  HENT:     Head: Normocephalic and atraumatic.     Comments: Resting in bed for nap with chronic tilting of head to left Eyes:     Comments: glasses  Cardiovascular:     Rate and Rhythm: Normal rate and regular rhythm.  Pulmonary:     Effort: Pulmonary effort is normal.     Breath sounds: Normal breath sounds. No rhonchi.  Abdominal:     General: Bowel sounds are normal. There is no distension.     Palpations: Abdomen is soft.     Tenderness: There is no abdominal tenderness.  Musculoskeletal:        General: No tenderness.     Right lower leg: No edema.     Left lower leg: No edema.     Comments: Stiffness of neck with contracture to left; foot drop chronic  Skin:    General: Skin is warm and dry.     Capillary Refill: Capillary refill takes less than 2 seconds.  Neurological:     Mental Status: He is alert.     Motor: Weakness present.     Gait: Gait abnormal.  Psychiatric:        Mood and Affect: Mood normal.     Labs reviewed: Recent Labs    01/17/19 02/15/19 0600 03/28/19  NA 140 142 141  K 4.2 4.2 4.4  BUN 18 24* 23*  CREATININE 0.7 0.8 0.8   Recent Labs    01/17/19  AST 19  ALT 15  ALKPHOS 102   Recent Labs    01/17/19  WBC 6.8  HGB 14.5  HCT 43  PLT 166   Lab Results  Component Value Date   TSH 2.56 08/22/2017   Lab Results  Component Value Date   HGBA1C 5.7 (H) 01/13/2013   Lab Results  Component Value Date   CHOL 193 08/17/2016   HDL 46 08/17/2016   LDLCALC 133 08/17/2016   TRIG 86 08/17/2016   CHOLHDL 3.9 05/13/2014    Assessment/Plan 1. Multiple sclerosis ([place]) -advanced with related dementia and contractures, dependent in adls -cont devices designed by OT to support neck contractures  2. Dementia associated with other underlying disease with behavioral disturbance ([place]) -advanced also -fewer behavioral concerns mentioned  (derogatory comments and agitation) this time -cont depakote sprinkles, zoloft along with his namenda   3.  Oropharyngeal dysphagia -cont dietary modifications and positional changes to help  4. Contracture of neck -cont OT interventions -unfortunately, seems to be progressive  5. Neurogenic bladder -cont suprapubic catheter, maintain adequate hydration to help prevent infections  6. DNR (do not resuscitate) - DNR (Do Not Resuscitate) reordered as expired  Family/ staff Communication: discussed with snf nurse and caregiver  Labs/tests ordered:  No new  Amyra Vantuyl L. Noal Abshier, D.O. [facility] [address], [place] [address] Cell Phone (Mon-Fri 8am-5pm):  [phone] On Call:  [phone] & follow prompts after 5pm & weekends Office Phone:  [phone] Office Fax:  [phone]

## 2019-07-03 DIAGNOSIS — Z9189 Other specified personal risk factors, not elsewhere classified: Secondary | ICD-10-CM | POA: Diagnosis not present

## 2019-07-08 DIAGNOSIS — Z9189 Other specified personal risk factors, not elsewhere classified: Secondary | ICD-10-CM | POA: Diagnosis not present

## 2019-07-15 ENCOUNTER — Non-Acute Institutional Stay (SKILLED_NURSING_FACILITY): Payer: Medicare Other | Admitting: Adult Health

## 2019-07-15 ENCOUNTER — Encounter: Payer: Self-pay | Admitting: Adult Health

## 2019-07-15 DIAGNOSIS — B369 Superficial mycosis, unspecified: Secondary | ICD-10-CM

## 2019-07-15 DIAGNOSIS — L03113 Cellulitis of right upper limb: Secondary | ICD-10-CM

## 2019-07-15 DIAGNOSIS — Z9359 Other cystostomy status: Secondary | ICD-10-CM

## 2019-07-15 DIAGNOSIS — Z9189 Other specified personal risk factors, not elsewhere classified: Secondary | ICD-10-CM | POA: Diagnosis not present

## 2019-07-15 NOTE — Progress Notes (Signed)
Location:  [facility]up[facility] psychologist of Service:  SNF (31) Provider:   Cindi Carbon, ANP [place] [phone]   [name], DO  Patient Care Team: [name], DO as PCP - General (Geriatric Medicine) [name], MD as Consulting Physician (Neurology) [name], MD as Consulting Physician (Urology) [name], MD as Consulting Physician (General Surgery) [name], MD as Consulting Physician (Orthopedic Surgery) [name], MD as Consulting Physician ([facility]) [name], MD as Consulting Physician (Gastroenterology) [name], DO as Consulting Physician (Geriatric Medicine) Duffy, Creola Corn, LCSW as Social Worker (Licensed Clinical Social Worker)  Extended Emergency Contact Information Primary Emergency Contact: [name] Address: [address]          [place], [place] [address] [name] of [place] Phone: [phone] Mobile Phone: [phone] Relation: Spouse  Code Status:  DNR Goals of care: Advanced Directive information Advanced Directives 07/02/2019  Does Patient Have a Medical Advance Directive? Yes  Type of Advance Directive Out of facility DNR (pink MOST or yellow form);Healthcare Power of Attorney  Does patient want to make changes to medical advance directive? No - Patient declined  Copy of [place] in Chart? Yes - validated most recent copy scanned in chart (See row information)  Would patient like information on creating a medical advance directive? -  Pre-existing out of facility DNR order (yellow form or pink MOST form) Yellow form placed in chart (order not valid for inpatient use);Pink MOST form placed in chart (order not valid for inpatient use)     Chief Complaint  Patient presents with  . Acute Visit    right hand swelling, left ear redness    HPI:  Pt is a 78 y.o. Rogers seen today for an acute visit for right hand swelling and  left ear redness. Also catheter not draining well.   The right hand has been red, warm, with small blisters forming in between his fingers for the past two days. The area is not painful, not draining, and he has not had a fever. He has severe MS and carries edema in this hand, which is his weaker side.   His weight is trending downward with less edema generally in the past week. Down to 226 from 234 on 10/20.  He is on lasix qod, no change has been made.   He has a leg bag for his suprapubic catheter and at times it does not drain well and kinks.         Past Medical History:  Diagnosis Date  . Adenomatous polyp   . Allergy   . BPH (benign prostatic hyperplasia)   . COPD (chronic obstructive pulmonary disease) ([place])   . Dementia ([place]) 2010  . Elevated PSA 07/31/2014  . Foot drop, right 2008  . 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 Generalized weakness 01/12/2013  . GERD (gastroesophageal reflux disease)   . Hyperlipidemia 07/31/2014  . Multiple sclerosis ([place]) 2008  . Prostatitis, chronic    Past Surgical History:  Procedure Laterality Date  . CATARACT EXTRACTION Left 02/06/12  . CATARACT EXTRACTION Right 2013  . ESOPHAGOGASTRODUODENOSCOPY ENDOSCOPY  04/16/2002   Dr. Earle Gell  . HERNIA REPAIR  1980'2000   3 inguinal hernia repairs on left  . HERNIA REPAIR  06/16/11   right inguinal hernia repair with mesh   . PROSTATE SURGERY  09/29/2008   Dr. Karsten Ro     Allergies  Allergen Reactions  .  Aricept [Donepezil Hcl] Other (See Comments)    Reaction:  Unknown   . Dimethyl Fumarate Nausea And Vomiting    Outpatient Encounter Medications as of 07/15/2019  Medication Sig  . cetirizine (ZYRTEC) 10 MG tablet Take 10 mg by mouth at bedtime.  . ciclopirox (LOPROX) 0.77 % cream Apply 1 application topically 2 (two) times daily. Pt applies to face.  . divalproex (DEPAKOTE SPRINKLE) 125 MG capsule Take 250 mg by mouth 2 (two) times daily.   Marland Kitchen docusate (COLACE) 50 MG/5ML liquid Take 100  mg by mouth daily. And give HS as needed for constipation. Hold for loose stool  . furosemide (LASIX) 40 MG tablet Take 40 mg by mouth every other day.   . gabapentin (NEURONTIN) 100 MG capsule Take 100 mg by mouth 3 (three) times daily.  Marland Kitchen ketoconazole (NIZORAL) 2 % shampoo Apply 1 application topically 2 (two) times a week. Pt uses on Monday and Thursday  . magnesium hydroxide (MILK OF MAGNESIA) 400 MG/5ML suspension Take 15 mLs by mouth daily as needed for mild constipation or moderate constipation.   . Melatonin 10 MG TABS Take 1 tablet by mouth at bedtime.  . memantine (NAMENDA) 10 MG tablet Take 10 mg by mouth 2 (two) times daily.  . Olopatadine HCl (PATADAY) 0.2 % SOLN Apply 1 drop to eye daily.  Marland Kitchen omeprazole (PRILOSEC) 20 MG capsule Take 20 mg daily by mouth.  . ondansetron (ZOFRAN) 4 MG tablet Take 4 mg by mouth every 8 (eight) hours as needed for nausea or vomiting.  . polyethylene glycol (MIRALAX / GLYCOLAX) packet Take 17 g by mouth every other day.   . potassium chloride SA (K-DUR) 20 MEQ tablet Take 20 mEq by mouth every other day.   . sertraline (ZOLOFT) 100 MG tablet Take 100 mg by mouth at bedtime.   No facility-administered encounter medications on file as of 07/15/2019.     Review of Systems  Constitutional: Negative for activity change, appetite change, chills, diaphoresis, fatigue, fever and unexpected weight change.  Respiratory: Negative for cough, shortness of breath, wheezing and stridor.   Cardiovascular: Positive for leg swelling. Negative for chest pain and palpitations.  Gastrointestinal: Negative for abdominal distention, abdominal pain, constipation and diarrhea.  Genitourinary: Negative for difficulty urinating and dysuria.  Musculoskeletal: Positive for gait problem. Negative for arthralgias, back pain, joint swelling and myalgias.  Skin: Positive for color change, rash and wound.  Neurological: Positive for weakness. Negative for dizziness, seizures,  syncope, facial asymmetry, speech difficulty and headaches.  Hematological: Negative for adenopathy. Does not bruise/bleed easily.  Psychiatric/Behavioral: Positive for agitation, behavioral problems and confusion.    Immunization History  Administered Date(s) Administered  . Influenza Inj Mdck Quad Pf 07/07/2016  . Influenza, High Dose Seasonal PF 07/04/2019  . Influenza,inj,Quad PF,6+ Mos 07/10/2018  . Influenza-Unspecified 07/02/2015, 10/Jose/2018  . Pneumococcal Conjugate-13 02/10/2015  . Pneumococcal Polysaccharide-23 10/23/2006  . Tdap 04/19/2005, 10/25/2016   Pertinent  Health Maintenance Due  Topic Date Due  . INFLUENZA VACCINE  Completed  . PNA vac Low Risk Adult  Completed   Fall Risk  01/10/2018 01/04/2017 07/04/2016 02/17/2016 08/23/2015  Falls in the past year? No No Yes No Yes  Number falls in past yr: - - 2 or more - 1  Injury with Fall? - - Yes - Yes  Comment - - - - -  Risk Factor Category  - - High Fall Risk - High Fall Risk  Risk for fall due to : - -  History of fall(s);Impaired balance/gait - History of fall(s);Impaired balance/gait;Impaired mobility;Medication side effect;Mental status change  Follow up - - Falls evaluation completed - Falls evaluation completed;Education provided;Falls prevention discussed   Functional Status Survey:    Vitals:   07/15/19 0953  BP: 125/78  Pulse: 76  Resp: (!) 22  Temp: 97.6 F (36.4 C)  SpO2: 93%  Weight: 226 lb (102.5 kg)   Body mass index is 29.82 kg/m. Physical Exam Vitals signs and nursing note reviewed.  Constitutional:      General: He is not in acute distress.    Appearance: He is not diaphoretic.  HENT:     Head: Normocephalic and atraumatic.  Neck:     Thyroid: No thyromegaly.     Vascular: No JVD.     Trachea: No tracheal deviation.  Cardiovascular:     Rate and Rhythm: Normal rate and regular rhythm.     Heart sounds: No murmur.  Pulmonary:     Effort: Pulmonary effort is normal. No respiratory  distress.     Breath sounds: Normal breath sounds. No wheezing.  Abdominal:     General: Bowel sounds are normal. There is no distension.     Palpations: Abdomen is soft.     Tenderness: There is no abdominal tenderness.  Musculoskeletal:        General: Swelling (generally noted to the right hand. ) present.     Right lower leg: Edema present.     Comments: Pos radial pulse on the right. Sensation intact. Decreased ROM due to MS. Small blisters noted to the middle finger in the web area with fluid. No tenderness. Redness noted to the fingers, palm, and wrist. Warmth noted.   Lymphadenopathy:     Cervical: No cervical adenopathy.  Skin:    General: Skin is warm and dry.     Comments: Left ear with erythema.  Small amt of white drainage noted and moisture behind the left ear.  Neurological:     Mental Status: He is alert.     Cranial Nerves: No cranial nerve deficit.     Comments: Oriented to self and place but not time. Right sided weakness     Labs reviewed: Recent Labs    01/17/19 02/15/19 0600 03/28/19  NA 140 142 141  K 4.2 4.2 4.4  BUN 18 24* 23*  CREATININE 0.7 0.8 0.8   Recent Labs    01/17/19  AST 19  ALT 15  ALKPHOS 102   Recent Labs    01/17/19  WBC 6.8  HGB 14.5  HCT 43  PLT 166   Lab Results  Component Value Date   TSH 2.56 08/22/2017   Lab Results  Component Value Date   HGBA1C 5.7 (H) 01/13/2013   Lab Results  Component Value Date   CHOL 193 08/17/2016   HDL 46 08/17/2016   LDLCALC 133 08/17/2016   TRIG 86 08/17/2016   CHOLHDL 3.9 08/Jose/2015    Significant Diagnostic Results in last 30 days:  No results found.  Assessment/Plan 1. Cellulitis of right hand Keflex 500 mg tid x 7 days. Florastor 1 cap bid x 7 days. Keep left hand elevated. Continue to monitor vitals and weight.   2. Fungal infection of skin Behind left ear Nystatin cream bid x 10 days, then keep area clean and dry and apply nystatin powder qd to left ear.   3.  Suprapubic catheter [facility]) May replace leg bag with foley bag to facilitate drainage. Continue to monitor output  and catheter maintenance by [facility]     Family/ staff Communication: nurse  Labs/tests ordered:  NA

## 2019-07-29 ENCOUNTER — Non-Acute Institutional Stay (SKILLED_NURSING_FACILITY): Payer: Medicare Other | Admitting: Adult Health

## 2019-07-29 DIAGNOSIS — F0281 Dementia in other diseases classified elsewhere with behavioral disturbance: Secondary | ICD-10-CM

## 2019-07-29 DIAGNOSIS — G35 Multiple sclerosis: Secondary | ICD-10-CM

## 2019-07-29 DIAGNOSIS — I5189 Other ill-defined heart diseases: Secondary | ICD-10-CM | POA: Diagnosis not present

## 2019-07-29 DIAGNOSIS — Z9359 Other cystostomy status: Secondary | ICD-10-CM

## 2019-07-29 DIAGNOSIS — M436 Torticollis: Secondary | ICD-10-CM | POA: Diagnosis not present

## 2019-07-29 DIAGNOSIS — R635 Abnormal weight gain: Secondary | ICD-10-CM

## 2019-07-29 DIAGNOSIS — F02818 Dementia in other diseases classified elsewhere, unspecified severity, with other behavioral disturbance: Secondary | ICD-10-CM

## 2019-07-29 DIAGNOSIS — N319 Neuromuscular dysfunction of bladder, unspecified: Secondary | ICD-10-CM | POA: Diagnosis not present

## 2019-07-29 NOTE — Progress Notes (Signed)
Location:  Occupational psychologist of Service:  SNF (31) Provider:   Cindi Carbon, ANP [place] [phone]  [name], DO  Patient Care Team: [name], DO as PCP - General (Geriatric Medicine) [name], MD as Consulting Physician (Neurology) [name], MD as Consulting Physician (Urology) [name], MD as Consulting Physician (General Surgery) [name], MD as Consulting Physician (Orthopedic Surgery) [name], MD as Consulting Physician ([facility]) [name], MD as Consulting Physician (Gastroenterology) [name], DO as Consulting Physician (Geriatric Medicine) Duffy, Creola Corn, LCSW as Social Worker (Licensed Clinical Social Worker)  Extended Emergency Contact Information Primary Emergency Contact: [name] Address: [address]          [place], [place] [address] [name] of [place] Phone: [phone] Mobile Phone: [phone] Relation: Spouse  Code Status: DNR Goals of care: Advanced Directive information Advanced Directives 07/02/2019  Does Patient Have a Medical Advance Directive? Yes  Type of Advance Directive Out of facility DNR (pink MOST or yellow form);Healthcare Power of Attorney  Does patient want to make changes to medical advance directive? No - Patient declined  Copy of [place] in Chart? Yes - validated most recent copy scanned in chart (See row information)  Would patient like information on creating a medical advance directive? -  Pre-existing out of facility DNR order (yellow form or pink MOST form) Yellow form placed in chart (order not valid for inpatient use);Pink MOST form placed in chart (order not valid for inpatient use)     Chief Complaint  Patient presents with  . Medical Management of Chronic Issues    HPI:  Pt is a 78 y.o. male seen today for medical management of chronic diseases.   The nurse reports that  he is having some periods of increased aggression and angry outburst that that tend to be in the dining room but with no clear trigger. He has received vistaril today and is quite calm for my visit. His care taker is at the bedside and states that at times his neck is uncomfortable when he is up in the chair. He is using the brace due to neck weakness but some times it puts pressure on his ear.  His s/p cath leaks at times but there are no issues with bladder pain or urgency.  No issues with coughing, choking, fever, or sob. He continues to gain weight and has some edema in his right hand and feet but no sob, cp, doe, or need for oxygen.   Past Medical History:  Diagnosis Date  . Adenomatous polyp   . Allergy   . BPH (benign prostatic hyperplasia)   . COPD (chronic obstructive pulmonary disease) ([place])   . Dementia ([place]) 2010  . Elevated PSA 07/31/2014  . Foot drop, right 2008  . 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 Generalized weakness 01/12/2013  . GERD (gastroesophageal reflux disease)   . Hyperlipidemia 07/31/2014  . Multiple sclerosis ([place]) 2008  . Prostatitis, chronic    Past Surgical History:  Procedure Laterality Date  . CATARACT EXTRACTION Left 02/06/12  . CATARACT EXTRACTION Right 2013  . ESOPHAGOGASTRODUODENOSCOPY ENDOSCOPY  04/16/2002   Dr. Earle Gell  . HERNIA REPAIR  1980'2000   3 inguinal hernia repairs on left  . HERNIA REPAIR  06/16/11   right inguinal hernia repair with mesh   . PROSTATE SURGERY  09/29/2008   Dr. Karsten Ro     Allergies  Allergen Reactions  . Aricept [Donepezil Hcl] Other (See Comments)    Reaction:  Unknown   . Dimethyl Fumarate Nausea And Vomiting    Outpatient Encounter Medications as of 07/29/2019  Medication Sig  . hydrOXYzine (ATARAX/VISTARIL) 25 MG tablet Take 25 mg by mouth every 6 (six) hours as needed.  . cetirizine (ZYRTEC) 10 MG tablet Take 10 mg by mouth at bedtime.  . ciclopirox (LOPROX) 0.77 % cream Apply 1 application topically 2  (two) times daily. Pt applies to face.  . divalproex (DEPAKOTE SPRINKLE) 125 MG capsule Take 250 mg by mouth 2 (two) times daily.   Marland Kitchen docusate (COLACE) 50 MG/5ML liquid Take 100 mg by mouth daily. And give HS as needed for constipation. Hold for loose stool  . furosemide (LASIX) 40 MG tablet Take 40 mg by mouth every other day.   . gabapentin (NEURONTIN) 100 MG capsule Take 100 mg by mouth at bedtime.   Marland Kitchen ketoconazole (NIZORAL) 2 % shampoo Apply 1 application topically 2 (two) times a week. Pt uses on Monday and Thursday  . magnesium hydroxide (MILK OF MAGNESIA) 400 MG/5ML suspension Take 15 mLs by mouth daily as needed for mild constipation or moderate constipation.   . Melatonin 10 MG TABS Take 1 tablet by mouth at bedtime.  . memantine (NAMENDA) 10 MG tablet Take 10 mg by mouth 2 (two) times daily.  . Olopatadine HCl (PATADAY) 0.2 % SOLN Apply 1 drop to eye daily.  Marland Kitchen omeprazole (PRILOSEC) 20 MG capsule Take 20 mg daily by mouth.  . ondansetron (ZOFRAN) 4 MG tablet Take 4 mg by mouth every 8 (eight) hours as needed for nausea or vomiting.  . polyethylene glycol (MIRALAX / GLYCOLAX) packet Take 17 g by mouth every other day.   . potassium chloride SA (K-DUR) 20 MEQ tablet Take 20 mEq by mouth every other day.   . sertraline (ZOLOFT) 100 MG tablet Take 100 mg by mouth at bedtime.   No facility-administered encounter medications on file as of 07/29/2019.     Review of Systems  Constitutional: Negative for activity change, appetite change, chills, diaphoresis, fatigue, fever and unexpected weight change.  Respiratory: Negative for cough, shortness of breath, wheezing and stridor.   Cardiovascular: Positive for leg swelling. Negative for chest pain and palpitations.  Gastrointestinal: Negative for abdominal distention, abdominal pain, constipation and diarrhea.  Genitourinary: Negative for difficulty urinating and dysuria.  Musculoskeletal: Positive for gait problem and neck pain. Negative for  arthralgias, back pain, joint swelling and myalgias.  Neurological: Positive for weakness. Negative for dizziness, seizures, syncope, facial asymmetry, speech difficulty and headaches.  Hematological: Negative for adenopathy. Does not bruise/bleed easily.  Psychiatric/Behavioral: Positive for agitation, behavioral problems and confusion.    Immunization History  Administered Date(s) Administered  . Influenza Inj Mdck Quad Pf 07/07/2016  . Influenza, High Dose Seasonal PF 07/04/2019  . Influenza,inj,Quad PF,6+ Mos 07/10/2018  . Influenza-Unspecified 07/02/2015, 07/13/2017  . Pneumococcal Conjugate-13 02/10/2015  . Pneumococcal Polysaccharide-23 10/23/2006  . Tdap 04/19/2005, 10/25/2016   Pertinent  Health Maintenance Due  Topic Date Due  . INFLUENZA VACCINE  Completed  . PNA vac Low Risk Adult  Completed   Fall Risk  01/10/2018 01/04/2017 07/04/2016 02/17/2016 08/23/2015  Falls in the past year? No No Yes No Yes  Number falls in past yr: - - 2 or more - 1  Injury with Fall? - - Yes - Yes  Comment - - - - -  Risk Factor Category  - - High  Fall Risk - High Fall Risk  Risk for fall due to : - - History of fall(s);Impaired balance/gait - History of fall(s);Impaired balance/gait;Impaired mobility;Medication side effect;Mental status change  Follow up - - Falls evaluation completed - Falls evaluation completed;Education provided;Falls prevention discussed   Functional Status Survey:    Vitals:   07/29/19 1210  Weight: 230 lb (104.3 kg)   Body mass index is 30.34 kg/m. Physical Exam Vitals signs and nursing note reviewed.  Constitutional:      General: He is not in acute distress.    Appearance: He is not diaphoretic.  HENT:     Head: Normocephalic and atraumatic.     Ears:     Comments: Left ear with mild redness and slight tenderness to touch. No drainage or open areas. Improved from last visit.  Neck:     Thyroid: No thyromegaly.     Vascular: No JVD.     Trachea: No  tracheal deviation.     Comments: Neck weakness noted, decreased range of motion. No swelling or tenderness.  Cardiovascular:     Rate and Rhythm: Normal rate and regular rhythm.     Heart sounds: No murmur.  Pulmonary:     Effort: Pulmonary effort is normal. No respiratory distress.     Breath sounds: Normal breath sounds. No wheezing.  Abdominal:     General: Bowel sounds are normal. There is no distension.     Palpations: Abdomen is soft.     Tenderness: There is no abdominal tenderness.  Musculoskeletal:     Right lower leg: Edema (R>L) present.     Left lower leg: Edema present.  Lymphadenopathy:     Cervical: No cervical adenopathy.  Skin:    General: Skin is warm and dry.  Neurological:     Mental Status: He is alert.     Cranial Nerves: No cranial nerve deficit.     Comments: Oriented to self and place. Right sided weakness that is chronic.      Labs reviewed: Recent Labs    01/17/19 02/15/19 0600 03/28/19  NA 140 142 141  K 4.2 4.2 4.4  BUN 18 24* 23*  CREATININE 0.7 0.8 0.8   Recent Labs    01/17/19  AST 19  ALT 15  ALKPHOS 102   Recent Labs    01/17/19  WBC 6.8  HGB 14.5  HCT 43  PLT 166   Lab Results  Component Value Date   TSH 2.56 08/22/2017   Lab Results  Component Value Date   HGBA1C 5.7 (H) 01/13/2013   Lab Results  Component Value Date   CHOL 193 08/17/2016   HDL 46 08/17/2016   LDLCALC 133 08/17/2016   TRIG 86 08/17/2016   CHOLHDL 3.9 05/13/2014    Significant Diagnostic Results in last 30 days:  No results found.  Assessment/Plan 1. Multiple sclerosis ([place]) Severe in nature, the reason for his stay in skilled care.   2. Neurogenic bladder Decrease Neurontin to 100 mg qhs  3. Suprapubic catheter ([facility]) Occasional periods of leakage. Check catheter for patency regularly and avoid leg bag use if frequent leakage noted.   4. Weight gain Due to immobility, increased intake,and medications. Will attempt to taper neurontin to  help with swelling and weight gain since he has not complained of bladder pain recently.   5. Diastolic dysfunction Appears compensated at this time. Continue lasix 40 mg qod.  6. Contracture of neck Recommend periods of rest, at least 2, during the  day. Continue brace use and frequent skin checks.   7. Dementia associated with other underlying disease with behavioral disturbance ([place]) Continues with periods of agitation. Would not taper zoloft or depakote at this time. Continue prn vistaril for angry outbursts.  Family/ staff Communication: spoke with his wife Vickii Chafe  Labs/tests ordered: NA

## 2019-07-31 DIAGNOSIS — Z9189 Other specified personal risk factors, not elsewhere classified: Secondary | ICD-10-CM | POA: Diagnosis not present

## 2019-08-01 ENCOUNTER — Encounter: Payer: Self-pay | Admitting: Adult Health

## 2019-08-05 ENCOUNTER — Encounter: Payer: Self-pay | Admitting: Adult Health

## 2019-08-05 ENCOUNTER — Non-Acute Institutional Stay (SKILLED_NURSING_FACILITY): Payer: Medicare Other | Admitting: Adult Health

## 2019-08-05 DIAGNOSIS — N319 Neuromuscular dysfunction of bladder, unspecified: Secondary | ICD-10-CM

## 2019-08-05 DIAGNOSIS — Z9359 Other cystostomy status: Secondary | ICD-10-CM

## 2019-08-05 NOTE — Progress Notes (Signed)
Location:  Occupational psychologist of Service:  SNF (31) Provider:   Cindi Carbon, ANP [place] [phone]  [name], DO  Patient Care Team: [name], DO as PCP - General (Geriatric Medicine) [name], MD as Consulting Physician (Neurology) [name], MD as Consulting Physician (Urology) [name], MD as Consulting Physician (General Surgery) [name], MD as Consulting Physician (Orthopedic Surgery) [name], MD as Consulting Physician ([facility]) [name], MD as Consulting Physician (Gastroenterology) [name], DO as Consulting Physician (Geriatric Medicine) Duffy, Creola Corn, LCSW as Social Worker (Licensed Clinical Social Worker)  Extended Emergency Contact Information Primary Emergency Contact: [name] Address: [address]          [place], [place] [address] [name] of [place] Phone: [phone] Mobile Phone: [phone] Relation: Spouse  Code Status:  DNR Goals of care: Advanced Directive information Advanced Directives 07/02/2019  Does Patient Have a Medical Advance Directive? Yes  Type of Advance Directive Out of facility DNR (pink MOST or yellow form);Healthcare Power of Attorney  Does patient want to make changes to medical advance directive? No - Patient declined  Copy of [place] in Chart? Yes - validated most recent copy scanned in chart (See row information)  Would patient like information on creating a medical advance directive? -  Pre-existing out of facility DNR order (yellow form or pink MOST form) Yellow form placed in chart (order not valid for inpatient use);Pink MOST form placed in chart (order not valid for inpatient use)     Chief Complaint  Patient presents with  . Acute Visit    cath not draining     HPI:  Pt is a 78 y.o. male seen today for an acute visit for issues with his suprapubic catheter not  draining. He has a hx of neurogenic bladder related to MS. He has not had any bladder pain, back pain, fever, hematuria, etc. He has had issues with large amounts of urine leaking and increased sense of urgency. Last week his neurontin was reduced to 100 mg qhs due in attempt to reduce pill burden and edema. The issues with leakage had been going on prior to that. He is sleepy this morning but did eat breakfast. Vitals are stable. The nurse reported the catheter was clogged and exchanged the catheter with clear yellow urine noted.    Past Medical History:  Diagnosis Date  . Adenomatous polyp   . Allergy   . BPH (benign prostatic hyperplasia)   . COPD (chronic obstructive pulmonary disease) ([place])   . Dementia ([place]) 2010  . Elevated PSA 07/31/2014  . Foot drop, right 2008  . 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 Generalized weakness 01/12/2013  . GERD (gastroesophageal reflux disease)   . Hyperlipidemia 07/31/2014  . Multiple sclerosis ([place]) 2008  . Prostatitis, chronic    Past Surgical History:  Procedure Laterality Date  . CATARACT EXTRACTION Left 02/06/12  . CATARACT EXTRACTION Right 2013  . ESOPHAGOGASTRODUODENOSCOPY ENDOSCOPY  04/16/2002   Dr. Earle Gell  . HERNIA REPAIR  1980'2000   3 inguinal hernia repairs on left  . HERNIA REPAIR  06/16/11   right inguinal hernia repair with mesh   . PROSTATE SURGERY  09/29/2008   Dr. Karsten Ro     Allergies  Allergen Reactions  . Aricept [Donepezil Hcl] Other (See Comments)    Reaction:  Unknown   . Dimethyl Fumarate Nausea And Vomiting  Outpatient Encounter Medications as of 08/05/2019  Medication Sig  . cetirizine (ZYRTEC) 10 MG tablet Take 10 mg by mouth at bedtime.  . ciclopirox (LOPROX) 0.77 % cream Apply 1 application topically 2 (two) times daily. Pt applies to face.  . divalproex (DEPAKOTE SPRINKLE) 125 MG capsule Take 250 mg by mouth 2 (two) times daily.   Marland Kitchen docusate (COLACE) 50 MG/5ML liquid Take 100 mg by mouth daily. And give HS  as needed for constipation. Hold for loose stool  . furosemide (LASIX) 40 MG tablet Take 40 mg by mouth every other day.   . gabapentin (NEURONTIN) 100 MG capsule Take 100 mg by mouth at bedtime.   . hydrOXYzine (ATARAX/VISTARIL) 25 MG tablet Take 25 mg by mouth every 6 (six) hours as needed.  Marland Kitchen ketoconazole (NIZORAL) 2 % shampoo Apply 1 application topically 2 (two) times a week. Pt uses on Monday and Thursday  . magnesium hydroxide (MILK OF MAGNESIA) 400 MG/5ML suspension Take 15 mLs by mouth daily as needed for mild constipation or moderate constipation.   . Melatonin 10 MG TABS Take 1 tablet by mouth at bedtime.  . memantine (NAMENDA) 10 MG tablet Take 10 mg by mouth 2 (two) times daily.  . Olopatadine HCl (PATADAY) 0.2 % SOLN Apply 1 drop to eye daily.  Marland Kitchen omeprazole (PRILOSEC) 20 MG capsule Take 20 mg daily by mouth.  . ondansetron (ZOFRAN) 4 MG tablet Take 4 mg by mouth every 8 (eight) hours as needed for nausea or vomiting.  . polyethylene glycol (MIRALAX / GLYCOLAX) packet Take 17 g by mouth every other day.   . potassium chloride SA (K-DUR) 20 MEQ tablet Take 20 mEq by mouth every other day.   . sertraline (ZOLOFT) 100 MG tablet Take 100 mg by mouth at bedtime.   No facility-administered encounter medications on file as of 08/05/2019.     Review of Systems  Constitutional: Positive for fatigue. Negative for activity change, appetite change, chills, diaphoresis, fever and unexpected weight change.  Respiratory: Negative for cough and shortness of breath.   Cardiovascular: Positive for leg swelling. Negative for chest pain and palpitations.  Gastrointestinal: Negative for abdominal distention, abdominal pain, constipation, diarrhea and nausea.  Genitourinary: Positive for decreased urine volume, frequency and urgency. Negative for difficulty urinating, discharge, dysuria, enuresis, flank pain, genital sores, hematuria, penile pain, penile swelling, scrotal swelling and testicular pain.   Musculoskeletal: Positive for gait problem.  Psychiatric/Behavioral: Positive for confusion.    Immunization History  Administered Date(s) Administered  . Influenza Inj Mdck Quad Pf 07/07/2016  . Influenza, High Dose Seasonal PF 07/04/2019  . Influenza,inj,Quad PF,6+ Mos 07/10/2018  . Influenza-Unspecified 07/02/2015, 07/13/2017  . Pneumococcal Conjugate-13 02/10/2015  . Pneumococcal Polysaccharide-23 10/23/2006  . Tdap 04/19/2005, 10/25/2016   Pertinent  Health Maintenance Due  Topic Date Due  . INFLUENZA VACCINE  Completed  . PNA vac Low Risk Adult  Completed   Fall Risk  01/10/2018 01/04/2017 07/04/2016 02/17/2016 08/23/2015  Falls in the past year? No No Yes No Yes  Number falls in past yr: - - 2 or more - 1  Injury with Fall? - - Yes - Yes  Comment - - - - -  Risk Factor Category  - - High Fall Risk - High Fall Risk  Risk for fall due to : - - History of fall(s);Impaired balance/gait - History of fall(s);Impaired balance/gait;Impaired mobility;Medication side effect;Mental status change  Follow up - - Falls evaluation completed - Falls evaluation completed;Education provided;Falls prevention discussed  Functional Status Survey:    Vitals:   08/05/19 1039  BP: 115/77  Pulse: 80  Resp: (!) 26  Temp: 98.9 F (37.2 C)  SpO2: 100%   There is no height or weight on file to calculate BMI. Physical Exam Vitals signs and nursing note reviewed.  Constitutional:      General: He is not in acute distress.    Appearance: He is not diaphoretic.     Comments: Sleepy but easily arouses  HENT:     Head: Normocephalic and atraumatic.  Neck:     Musculoskeletal: Normal range of motion and neck supple.     Thyroid: No thyromegaly.     Vascular: No JVD.     Trachea: No tracheal deviation.  Cardiovascular:     Rate and Rhythm: Normal rate and regular rhythm.     Heart sounds: No murmur.  Pulmonary:     Effort: Pulmonary effort is normal. No respiratory distress.     Breath  sounds: Normal breath sounds. No wheezing.  Abdominal:     General: Bowel sounds are normal. There is no distension.     Palpations: Abdomen is soft.     Tenderness: There is no abdominal tenderness. There is no right CVA tenderness, left CVA tenderness or guarding.     Comments: rotund  Genitourinary:    Penis: Normal.      Comments: Foley with clear urine draining. Cath site WNL Musculoskeletal:     Right lower leg: Edema present.     Left lower leg: Edema present.  Lymphadenopathy:     Cervical: No cervical adenopathy.  Skin:    General: Skin is warm and dry.  Neurological:     Mental Status: Mental status is at baseline.     Comments: Right sided weakness chronic  Psychiatric:        Mood and Affect: Mood normal.     Labs reviewed: Recent Labs    01/17/19 02/15/19 0600 03/28/19  NA 140 142 141  K 4.2 4.2 4.4  BUN 18 24* 23*  CREATININE 0.7 0.8 0.8   Recent Labs    01/17/19  AST 19  ALT 15  ALKPHOS 102   Recent Labs    01/17/19  WBC 6.8  HGB 14.5  HCT 43  PLT 166   Lab Results  Component Value Date   TSH 2.56 08/22/2017   Lab Results  Component Value Date   HGBA1C 5.7 (H) 01/13/2013   Lab Results  Component Value Date   CHOL 193 08/17/2016   HDL 46 08/17/2016   LDLCALC 133 08/17/2016   TRIG 86 08/17/2016   CHOLHDL 3.9 05/13/2014    Significant Diagnostic Results in last 30 days:  No results found.  Assessment/Plan  1. Neurogenic bladder Change cath size to 22 Fr Irrigate 30 NS qshift prn  If no improvement resume Neurontin at 100 mg tid   2. Suprapubic catheter As above   Family/ staff Communication: resident   Labs/tests ordered:  NA

## 2019-08-13 DIAGNOSIS — Z9189 Other specified personal risk factors, not elsewhere classified: Secondary | ICD-10-CM | POA: Diagnosis not present

## 2019-08-13 DIAGNOSIS — B351 Tinea unguium: Secondary | ICD-10-CM | POA: Diagnosis not present

## 2019-08-13 DIAGNOSIS — M2041 Other hammer toe(s) (acquired), right foot: Secondary | ICD-10-CM | POA: Diagnosis not present

## 2019-08-26 ENCOUNTER — Non-Acute Institutional Stay (SKILLED_NURSING_FACILITY): Payer: Medicare Other | Admitting: Adult Health

## 2019-08-26 DIAGNOSIS — M436 Torticollis: Secondary | ICD-10-CM | POA: Diagnosis not present

## 2019-08-26 DIAGNOSIS — N319 Neuromuscular dysfunction of bladder, unspecified: Secondary | ICD-10-CM | POA: Diagnosis not present

## 2019-08-26 DIAGNOSIS — F02818 Dementia in other diseases classified elsewhere, unspecified severity, with other behavioral disturbance: Secondary | ICD-10-CM

## 2019-08-26 DIAGNOSIS — Z9359 Other cystostomy status: Secondary | ICD-10-CM | POA: Diagnosis not present

## 2019-08-26 DIAGNOSIS — G35 Multiple sclerosis: Secondary | ICD-10-CM | POA: Diagnosis not present

## 2019-08-26 DIAGNOSIS — F0281 Dementia in other diseases classified elsewhere with behavioral disturbance: Secondary | ICD-10-CM | POA: Diagnosis not present

## 2019-08-27 DIAGNOSIS — Z5181 Encounter for therapeutic drug level monitoring: Secondary | ICD-10-CM | POA: Diagnosis not present

## 2019-08-27 DIAGNOSIS — R569 Unspecified convulsions: Secondary | ICD-10-CM | POA: Diagnosis not present

## 2019-08-29 ENCOUNTER — Encounter: Payer: Self-pay | Admitting: Adult Health

## 2019-08-29 NOTE — Progress Notes (Signed)
Location:  [facility] of Service:  SNF (31) Provider:   Cindi Carbon, ANP [place] [phone]  [name], DO  Patient Care Team: [name], DO as PCP - General (Geriatric Medicine) [name], MD as Consulting Physician (Neurology) [name], MD as Consulting Physician (Urology) [name], MD as Consulting Physician (General Surgery) [name], MD as Consulting Physician (Orthopedic Surgery) [name], MD as Consulting Physician ([facility]) [name], MD as Consulting Physician (Gastroenterology) [name], DO as Consulting Physician (Geriatric Medicine) Duffy, Creola Corn, LCSW as Social Worker (Licensed Clinical Social Worker)  Extended Emergency Contact Information Primary Emergency Contact: [name] Address: [address]          [place], [place] [address] [name] of [place] Phone: [phone] Mobile Phone: [phone] Relation: Spouse  Code Status: DNR Goals of care: Advanced Directive information Advanced Directives 07/02/2019  Does Patient Have a Medical Advance Directive? Yes  Type of Advance Directive Out of facility DNR (pink MOST or yellow form);Healthcare Power of Attorney  Does patient want to make changes to medical advance directive? No - Patient declined  Copy of [place] in Chart? Yes - validated most recent copy scanned in chart (See row information)  Would patient like information on creating a medical advance directive? -  Pre-existing out of facility DNR order (yellow form or pink MOST form) Yellow form placed in chart (order not valid for inpatient use);Pink MOST form placed in chart (order not valid for inpatient use)     Chief Complaint  Patient presents with  . Medical Management of Chronic Issues    HPI:  Pt is a 78 y.o. male seen today for medical management of chronic diseases.   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  His nurse reports that he has had a good week in terms of behavior. No issues with yelling or aggression. He has a hx of neurogenic bladder and has an s/p cath. There were issues with leakage and so a 22 FR was placed and this seems to have helped. He is on neurontin which was reduced to 100 mg qhs to as a part of a [place].  He has tolerated this well.   Past Medical History:  Diagnosis Date  . Adenomatous polyp   . Allergy   . BPH (benign prostatic hyperplasia)   . COPD (chronic obstructive pulmonary disease) ([place])   . Dementia ([place]) 2010  . Elevated PSA 07/31/2014  . Foot drop, right 2008  . 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 Generalized weakness 01/12/2013  . GERD (gastroesophageal reflux disease)   . Hyperlipidemia 07/31/2014  . Multiple sclerosis ([place]) 2008  . Prostatitis, chronic    Past Surgical History:  Procedure Laterality Date  . CATARACT EXTRACTION Left 02/06/12  . CATARACT EXTRACTION Right 2013  . ESOPHAGOGASTRODUODENOSCOPY ENDOSCOPY  04/16/2002   Dr. Earle Gell  . HERNIA REPAIR  1980'2000   3 inguinal hernia repairs on left  . HERNIA REPAIR  06/16/11   right inguinal hernia repair with mesh   . PROSTATE SURGERY  09/29/2008   Dr. Karsten Ro     Allergies  Allergen Reactions  . Aricept [Donepezil Hcl] Other (See Comments)    Reaction:  Unknown   . Dimethyl Fumarate Nausea And Vomiting    Outpatient Encounter Medications as of 08/26/2019  Medication Sig  . cetirizine (ZYRTEC) 10 MG tablet Take 10  mg by mouth at bedtime.  . ciclopirox (LOPROX) 0.77 % cream Apply 1 application topically 2 (two) times daily. Pt applies to face.  . divalproex (DEPAKOTE SPRINKLE) 125 MG capsule Take 250 mg by mouth 2 (two) times daily.   Marland Kitchen docusate (COLACE) 50 MG/5ML liquid Take 100 mg by mouth daily. And give HS as needed for constipation. Hold for loose stool  . furosemide (LASIX) 40 MG tablet Take 40 mg by mouth every other day.   . gabapentin  (NEURONTIN) 100 MG capsule Take 100 mg by mouth at bedtime.   . hydrOXYzine (ATARAX/VISTARIL) 25 MG tablet Take 25 mg by mouth every 6 (six) hours as needed.  Marland Kitchen ketoconazole (NIZORAL) 2 % shampoo Apply 1 application topically 2 (two) times a week. Pt uses on Monday and Thursday  . magnesium hydroxide (MILK OF MAGNESIA) 400 MG/5ML suspension Take 15 mLs by mouth daily as needed for mild constipation or moderate constipation.   . Melatonin 10 MG TABS Take 1 tablet by mouth at bedtime.  . memantine (NAMENDA) 10 MG tablet Take 10 mg by mouth 2 (two) times daily.  Marland Kitchen nystatin ointment (MYCOSTATIN) Apply 1 application topically daily.  . Olopatadine HCl (PATADAY) 0.2 % SOLN Apply 1 drop to eye daily.  Marland Kitchen omeprazole (PRILOSEC) 20 MG capsule Take 20 mg daily by mouth.  . ondansetron (ZOFRAN) 4 MG tablet Take 4 mg by mouth every 8 (eight) hours as needed for nausea or vomiting.  . polyethylene glycol (MIRALAX / GLYCOLAX) packet Take 17 g by mouth every other day.   . potassium chloride SA (K-DUR) 20 MEQ tablet Take 20 mEq by mouth every other day.   . sertraline (ZOLOFT) 100 MG tablet Take 100 mg by mouth at bedtime.   No facility-administered encounter medications on file as of 08/26/2019.    Review of Systems  Constitutional: Negative for activity change, appetite change, chills, diaphoresis, fatigue, fever and unexpected weight change.  Respiratory: Negative for cough, shortness of breath, wheezing and stridor.   Cardiovascular: Positive for leg swelling. Negative for chest pain and palpitations.  Gastrointestinal: Negative for abdominal distention, abdominal pain, constipation and diarrhea.  Genitourinary: Negative for difficulty urinating, dysuria, frequency and urgency.  Musculoskeletal: Positive for gait problem. Negative for arthralgias, back pain, joint swelling, myalgias and neck pain.  Neurological: Positive for weakness. Negative for dizziness, seizures, syncope, facial asymmetry, speech  difficulty and headaches.  Hematological: Negative for adenopathy. Does not bruise/bleed easily.  Psychiatric/Behavioral: Positive for confusion. Negative for agitation and behavioral problems.    Immunization History  Administered Date(s) Administered  . Influenza Inj Mdck Quad Pf 07/07/2016  . Influenza, High Dose Seasonal PF 07/04/2019  . Influenza,inj,Quad PF,6+ Mos 07/10/2018  . Influenza-Unspecified 07/02/2015, 07/13/2017  . Pneumococcal Conjugate-13 02/10/2015  . Pneumococcal Polysaccharide-23 10/23/2006  . Tdap 04/19/2005, 10/25/2016   Pertinent  Health Maintenance Due  Topic Date Due  . INFLUENZA VACCINE  Completed  . PNA vac Low Risk Adult  Completed   Fall Risk  04/19/2019 01/10/2018 01/04/2017 07/04/2016 02/17/2016  Falls in the past year? (No Data) No No Yes No  Comment Emmi Telephone Survey: data to providers prior to load - - - -  Number falls in past yr: (No Data) - - 2 or more -  Comment Emmi Telephone Survey Actual Response =  - - - -  Injury with Fall? - - - Yes -  Comment - - - - -  Risk Factor Category  - - - High Fall Risk -  Risk for fall due to : - - - History of fall(s);Impaired balance/gait -  Follow up - - - Falls evaluation completed -   Functional Status Survey:    Vitals:   08/29/19 1208  Weight: 226 lb 12.8 oz (102.9 kg)   Body mass index is 29.92 kg/m. Physical Exam Vitals and nursing note reviewed.  Constitutional:      General: He is not in acute distress.    Appearance: He is not diaphoretic.  HENT:     Head: Normocephalic and atraumatic.     Ears:     Comments: Left ear with mild redness and slight tenderness to touch. No drainage or open areas. Improved from last visit.  Neck:     Thyroid: No thyromegaly.     Vascular: No JVD.     Trachea: No tracheal deviation.     Comments: Neck weakness noted, decreased range of motion. No swelling or tenderness.  Cardiovascular:     Rate and Rhythm: Normal rate and regular rhythm.     Heart  sounds: No murmur.  Pulmonary:     Effort: Pulmonary effort is normal. No respiratory distress.     Breath sounds: Normal breath sounds. No wheezing.  Abdominal:     General: Bowel sounds are normal. There is no distension.     Palpations: Abdomen is soft.     Tenderness: There is no abdominal tenderness.  Musculoskeletal:     Right lower leg: Edema (R>L) present.     Left lower leg: Edema present.  Lymphadenopathy:     Cervical: No cervical adenopathy.  Skin:    General: Skin is warm and dry.  Neurological:     Mental Status: He is alert.     Cranial Nerves: No cranial nerve deficit.     Comments: Oriented to self and place. Right sided weakness that is chronic.      Labs reviewed: Recent Labs    01/17/19 0000 02/15/19 0600 03/28/19 0000  NA 140 142 141  K 4.2 4.2 4.4  BUN 18 24* 23*  CREATININE 0.7 0.8 0.8   Recent Labs    01/17/19 0000  AST 19  ALT 15  ALKPHOS 102   Recent Labs    01/17/19 0000  WBC 6.8  HGB 14.5  HCT 43  PLT 166   Lab Results  Component Value Date   TSH 2.56 08/22/2017   Lab Results  Component Value Date   HGBA1C 5.7 (H) 01/13/2013   Lab Results  Component Value Date   CHOL 193 08/17/2016   HDL 46 08/17/2016   LDLCALC 133 08/17/2016   TRIG 86 08/17/2016   CHOLHDL 3.9 05/13/2014    Significant Diagnostic Results in last 30 days:  No results found.  Assessment/Plan  1. Dementia associated with other underlying disease with behavioral disturbance ([place]) Improved behaviors at this time Continue depakote 250 mg bid and vistaril 26 mg q 6 hr sprn Check dep level   2. Multiple sclerosis (HCC) Severe in nature, supportive care only  3. Contracture of neck Continue neck brace for support and ROM exercises  4. Neurogenic bladder Continue neurontin 100 mg qhs, consider discontinuing at next visit   5. Suprapubic catheter ([place]) Improved drainage and no leakage with larger cath. May irrigate as needed as well.    Family/  staff Communication: spoke with his wife Vickii Chafe  Labs/tests ordered: NA

## 2019-09-25 DIAGNOSIS — Z23 Encounter for immunization: Secondary | ICD-10-CM | POA: Diagnosis not present

## 2019-09-26 ENCOUNTER — Non-Acute Institutional Stay (SKILLED_NURSING_FACILITY): Payer: Medicare Other | Admitting: Adult Health

## 2019-09-26 DIAGNOSIS — N319 Neuromuscular dysfunction of bladder, unspecified: Secondary | ICD-10-CM

## 2019-09-26 DIAGNOSIS — K219 Gastro-esophageal reflux disease without esophagitis: Secondary | ICD-10-CM | POA: Diagnosis not present

## 2019-09-26 DIAGNOSIS — F0281 Dementia in other diseases classified elsewhere with behavioral disturbance: Secondary | ICD-10-CM

## 2019-09-26 DIAGNOSIS — F02818 Dementia in other diseases classified elsewhere, unspecified severity, with other behavioral disturbance: Secondary | ICD-10-CM

## 2019-09-26 DIAGNOSIS — R1312 Dysphagia, oropharyngeal phase: Secondary | ICD-10-CM

## 2019-09-27 ENCOUNTER — Encounter: Payer: Self-pay | Admitting: Adult Health

## 2019-09-27 NOTE — Progress Notes (Signed)
Location:  [facility] of Service:  SNF (31) Provider:   Cindi Carbon, ANP [place] [phone]   [name], DO  Patient Care Team: [name], DO as PCP - General (Geriatric Medicine) [name], MD as Consulting Physician (Neurology) [name], MD as Consulting Physician (Urology) [name], MD as Consulting Physician (General Surgery) [name], MD as Consulting Physician (Orthopedic Surgery) [name], MD as Consulting Physician ([facility]) [name], MD as Consulting Physician (Gastroenterology) [name], DO as Consulting Physician (Geriatric Medicine) Duffy, Creola Corn, LCSW as Social Worker (Licensed Clinical Social Worker)  Extended Emergency Contact Information Primary Emergency Contact: [name] Address: [address]          [place], [place] [address] [name] of [place] Phone: [phone] Mobile Phone: [phone] Relation: Spouse  Code Status:  DNR Goals of care: Advanced Directive information Advanced Directives 07/02/2019  Does Patient Have a Medical Advance Directive? Yes  Type of Advance Directive Out of facility DNR (pink MOST or yellow form);Healthcare Power of Attorney  Does patient want to make changes to medical advance directive? No - Patient declined  Copy of [place] in Chart? Yes - validated most recent copy scanned in chart (See row information)  Would patient like information on creating a medical advance directive? -  Pre-existing out of facility DNR order (yellow form or pink MOST form) Yellow form placed in chart (order not valid for inpatient use);Pink MOST form placed in chart (order not valid for inpatient use)     Chief Complaint  Patient presents with  . Medical Management of Chronic Issues    HPI:  Pt is a 79 y.o. male seen today for medical management of chronic diseases.    He has a hx of  severe MS and resides in the skilled care environment. His caretaker reports that he has been pocketing food and leaves residual food in his mouth. He coughs during meals at times and even chokes on his own saliva. This has made mouth care challenging. He is currently on a D2 diet with NTL. He does not have any fever, sob, sore throat, runny nose, etc.   The nurse reports that his s/p cath is draining well and he has not had any issues with leakage this week, which has been a problem previously.   His caretaker also reports that he has been more quiet and "listless" lately. He has been in isolation in his room due to the covid 19 pandemic and outbreak at the facility.     Past Medical History:  Diagnosis Date  . Adenomatous polyp   . Allergy   . BPH (benign prostatic hyperplasia)   . COPD (chronic obstructive pulmonary disease) ([place])   . Dementia ([place]) 2010  . Elevated PSA 07/31/2014  . Foot drop, right 2008  . 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 Generalized weakness 01/12/2013  . GERD (gastroesophageal reflux disease)   . Hyperlipidemia 07/31/2014  . Multiple sclerosis ([place]) 2008  . Prostatitis, chronic    Past Surgical History:  Procedure Laterality Date  . CATARACT EXTRACTION Left 02/06/12  . CATARACT EXTRACTION Right 2013  . ESOPHAGOGASTRODUODENOSCOPY ENDOSCOPY  04/16/2002   Dr. Earle Gell  . HERNIA REPAIR  1980'2000   3 inguinal hernia repairs on left  . HERNIA REPAIR  06/16/11   right inguinal hernia repair with mesh   . PROSTATE SURGERY  09/29/2008   Dr. Karsten Ro  Allergies  Allergen Reactions  . Aricept [Donepezil Hcl] Other (See Comments)    Reaction:  Unknown   . Dimethyl Fumarate Nausea And Vomiting    Outpatient Encounter Medications as of 09/26/2019  Medication Sig  . cetirizine (ZYRTEC) 10 MG tablet Take 10 mg by mouth at bedtime.  . Melatonin 10 MG TABS Take 1 tablet by mouth at bedtime.  . memantine (NAMENDA) 10 MG tablet Take 10 mg by mouth 2 (two) times  daily.  . Olopatadine HCl (PATADAY) 0.2 % SOLN Apply 1 drop to eye daily.  Marland Kitchen omeprazole (PRILOSEC) 20 MG capsule Take 20 mg daily by mouth.  . ondansetron (ZOFRAN) 4 MG tablet Take 4 mg by mouth every 8 (eight) hours as needed for nausea or vomiting.  . polyethylene glycol (MIRALAX / GLYCOLAX) packet Take 17 g by mouth every other day.   . potassium chloride SA (K-DUR) 20 MEQ tablet Take 20 mEq by mouth every other day.   . sertraline (ZOLOFT) 100 MG tablet Take 100 mg by mouth at bedtime.  . ciclopirox (LOPROX) 0.77 % cream Apply 1 application topically 2 (two) times daily. Pt applies to face.  . divalproex (DEPAKOTE SPRINKLE) 125 MG capsule Take 250 mg by mouth 2 (two) times daily.   Marland Kitchen docusate (COLACE) 50 MG/5ML liquid Take 100 mg by mouth daily. And give HS as needed for constipation. Hold for loose stool  . furosemide (LASIX) 40 MG tablet Take 40 mg by mouth every other day.   . gabapentin (NEURONTIN) 100 MG capsule Take 100 mg by mouth at bedtime.   . hydrOXYzine (ATARAX/VISTARIL) 25 MG tablet Take 25 mg by mouth every 6 (six) hours as needed.  Marland Kitchen ketoconazole (NIZORAL) 2 % shampoo Apply 1 application topically 2 (two) times a week. Pt uses on Monday and Thursday  . magnesium hydroxide (MILK OF MAGNESIA) 400 MG/5ML suspension Take 15 mLs by mouth daily as needed for mild constipation or moderate constipation.   Marland Kitchen nystatin ointment (MYCOSTATIN) Apply 1 application topically daily.   No facility-administered encounter medications on file as of 09/26/2019.    Review of Systems  Unable to perform ROS: Other    Immunization History  Administered Date(s) Administered  . Influenza Inj Mdck Quad Pf 07/07/2016  . Influenza, High Dose Seasonal PF 07/04/2019  . Influenza,inj,Quad PF,6+ Mos 07/10/2018  . Influenza-Unspecified 07/02/2015, 07/13/2017  . Moderna SARS-COVID-2 Vaccination 09/24/2019  . Pneumococcal Conjugate-13 02/10/2015  . Pneumococcal Polysaccharide-23 10/23/2006  . Tdap  04/19/2005, 10/25/2016   Pertinent  Health Maintenance Due  Topic Date Due  . INFLUENZA VACCINE  Completed  . PNA vac Low Risk Adult  Completed   Fall Risk  04/19/2019 01/10/2018 01/04/2017 07/04/2016 02/17/2016  Falls in the past year? (No Data) No No Yes No  Comment Emmi Telephone Survey: data to providers prior to load - - - -  Number falls in past yr: (No Data) - - 2 or more -  Comment Emmi Telephone Survey Actual Response =  - - - -  Injury with Fall? - - - Yes -  Comment - - - - -  Risk Factor Category  - - - High Fall Risk -  Risk for fall due to : - - - History of fall(s);Impaired balance/gait -  Follow up - - - Falls evaluation completed -   Functional Status Survey:    Vitals:   09/27/19 1526  Weight: 224 lb 11.2 oz (101.9 kg)   Body mass index is 29.65 kg/m.  Wt Readings from Last 3 Encounters:  09/27/19 224 lb 11.2 oz (101.9 kg)  08/29/19 226 lb 12.8 oz (102.9 kg)  07/29/19 230 lb (104.3 kg)   Physical Exam Vitals and nursing note reviewed.  Constitutional:      General: He is not in acute distress.    Appearance: He is not diaphoretic.  HENT:     Head: Normocephalic and atraumatic.  Neck:     Thyroid: No thyromegaly.     Vascular: No JVD.     Trachea: No tracheal deviation.  Cardiovascular:     Rate and Rhythm: Normal rate and regular rhythm.     Heart sounds: No murmur.  Pulmonary:     Effort: Pulmonary effort is normal. No respiratory distress.     Breath sounds: Normal breath sounds. No wheezing.  Abdominal:     General: Bowel sounds are normal. There is no distension.     Palpations: Abdomen is soft.     Tenderness: There is no abdominal tenderness.  Genitourinary:    Comments: S/p cath with yellow urine Musculoskeletal:     Right lower leg: Edema present.     Left lower leg: No edema.  Lymphadenopathy:     Cervical: No cervical adenopathy.  Skin:    General: Skin is warm and dry.  Neurological:     Mental Status: He is alert.     Cranial  Nerves: No cranial nerve deficit.     Comments: Alert and oriented to self. Did not answer further questions. Right sided weakness chronic  Psychiatric:     Comments: Flat less talkative     Labs reviewed: Recent Labs    01/17/19 0000 02/15/19 0600 03/28/19 0000  NA 140 142 141  K 4.2 4.2 4.4  BUN 18 24* 23*  CREATININE 0.7 0.8 0.8   Recent Labs    01/17/19 0000  AST 19  ALT 15  ALKPHOS 102   Recent Labs    01/17/19 0000  WBC 6.8  HGB 14.5  HCT 43  PLT 166   Lab Results  Component Value Date   TSH 2.56 08/22/2017   Lab Results  Component Value Date   HGBA1C 5.7 (H) 01/13/2013   Lab Results  Component Value Date   CHOL 193 08/17/2016   HDL 46 08/17/2016   LDLCALC 133 08/17/2016   TRIG 86 08/17/2016   CHOLHDL 3.9 05/13/2014    Significant Diagnostic Results in last 30 days:  No results found.  Assessment/Plan 1. Oropharyngeal dysphagia Worsening. He may benefit from a suction tooth brush for mouth care and a down grade in his diet. Will consult ST for further recommendations.   2. Dementia associated with other underlying disease with behavioral disturbance ([place]) Improved behaviors recently with flattening of mood due to the covid 19 pandemic. Would not taper meds further at this time.   3. Gastroesophageal reflux disease without esophagitis Hx of vomiting and reflux, continue prilosec   4. Neurogenic bladder Improves symptoms Continue neurontin 100 mg qhs     Family/ staff Communication: resident and staff   Labs/tests ordered:  NA

## 2019-09-30 DIAGNOSIS — R1312 Dysphagia, oropharyngeal phase: Secondary | ICD-10-CM | POA: Diagnosis not present

## 2019-09-30 DIAGNOSIS — G35 Multiple sclerosis: Secondary | ICD-10-CM | POA: Diagnosis not present

## 2019-10-02 DIAGNOSIS — R1312 Dysphagia, oropharyngeal phase: Secondary | ICD-10-CM | POA: Diagnosis not present

## 2019-10-02 DIAGNOSIS — G35 Multiple sclerosis: Secondary | ICD-10-CM | POA: Diagnosis not present

## 2019-10-03 DIAGNOSIS — R1312 Dysphagia, oropharyngeal phase: Secondary | ICD-10-CM | POA: Diagnosis not present

## 2019-10-03 DIAGNOSIS — G35 Multiple sclerosis: Secondary | ICD-10-CM | POA: Diagnosis not present

## 2019-10-04 DIAGNOSIS — R1312 Dysphagia, oropharyngeal phase: Secondary | ICD-10-CM | POA: Diagnosis not present

## 2019-10-04 DIAGNOSIS — G35 Multiple sclerosis: Secondary | ICD-10-CM | POA: Diagnosis not present

## 2019-10-07 DIAGNOSIS — G35 Multiple sclerosis: Secondary | ICD-10-CM | POA: Diagnosis not present

## 2019-10-07 DIAGNOSIS — R1312 Dysphagia, oropharyngeal phase: Secondary | ICD-10-CM | POA: Diagnosis not present

## 2019-10-07 DIAGNOSIS — Z9189 Other specified personal risk factors, not elsewhere classified: Secondary | ICD-10-CM | POA: Diagnosis not present

## 2019-10-07 DIAGNOSIS — Z20828 Contact with and (suspected) exposure to other viral communicable diseases: Secondary | ICD-10-CM | POA: Diagnosis not present

## 2019-10-08 DIAGNOSIS — G35 Multiple sclerosis: Secondary | ICD-10-CM | POA: Diagnosis not present

## 2019-10-08 DIAGNOSIS — R1312 Dysphagia, oropharyngeal phase: Secondary | ICD-10-CM | POA: Diagnosis not present

## 2019-10-13 ENCOUNTER — Telehealth: Payer: Self-pay | Admitting: Nurse Practitioner

## 2019-10-13 NOTE — Telephone Encounter (Signed)
While on call 11/07/2019, nursing called in the am stating there had been a change in condition on pt. Per report He had hx of MS with aspiration pneumonia and was comfort care with DNR.  Pt had not been eating or drinking for the last 24 hours and legs were mottled from the  Knees down. Reports he was moaning with RR 40s.  Request for atropine drops and roxanol.  Orders given for atropine 2 drops q 4 hours PRN secretions and roxanol 5 mg- 0.25 ml q 4 hours PO PRN pain/discomfort given   Call received at 10 pm to notify that he had passed. Orders given to nurse to release to funeral home. Sent to PCP for [facility]

## 2019-10-14 ENCOUNTER — Encounter: Payer: Self-pay | Admitting: Internal Medicine

## 2019-10-14 NOTE — Telephone Encounter (Signed)
Lovenia Shuck, thanks.

## 2019-10-21 DEATH — deceased
